# Patient Record
Sex: Female | Born: 1970 | Race: Black or African American | Hispanic: No | Marital: Single | Smoking: Former smoker
Health system: Southern US, Community
[De-identification: ages and names within clinical notes are randomized; demographics above are authoritative.]

## PROBLEM LIST (undated history)

## (undated) DIAGNOSIS — K219 Gastro-esophageal reflux disease without esophagitis: Secondary | ICD-10-CM

## (undated) DIAGNOSIS — N183 Chronic kidney disease, stage 3 unspecified: Secondary | ICD-10-CM

## (undated) DIAGNOSIS — G4730 Sleep apnea, unspecified: Secondary | ICD-10-CM

## (undated) DIAGNOSIS — E785 Hyperlipidemia, unspecified: Secondary | ICD-10-CM

## (undated) DIAGNOSIS — D649 Anemia, unspecified: Secondary | ICD-10-CM

## (undated) DIAGNOSIS — I10 Essential (primary) hypertension: Secondary | ICD-10-CM

## (undated) DIAGNOSIS — I509 Heart failure, unspecified: Secondary | ICD-10-CM

## (undated) DIAGNOSIS — R569 Unspecified convulsions: Secondary | ICD-10-CM

## (undated) DIAGNOSIS — J449 Chronic obstructive pulmonary disease, unspecified: Secondary | ICD-10-CM

## (undated) HISTORY — DX: Unspecified convulsions: R56.9

## (undated) HISTORY — DX: Heart failure, unspecified: I50.9

---

## 2016-12-19 DIAGNOSIS — N289 Disorder of kidney and ureter, unspecified: Secondary | ICD-10-CM

## 2016-12-19 DIAGNOSIS — I509 Heart failure, unspecified: Secondary | ICD-10-CM

## 2016-12-19 DIAGNOSIS — J209 Acute bronchitis, unspecified: Secondary | ICD-10-CM

## 2016-12-20 DIAGNOSIS — I509 Heart failure, unspecified: Secondary | ICD-10-CM

## 2017-07-27 DIAGNOSIS — I160 Hypertensive urgency: Secondary | ICD-10-CM

## 2017-07-27 DIAGNOSIS — N183 Chronic kidney disease, stage 3 (moderate): Secondary | ICD-10-CM

## 2017-07-27 DIAGNOSIS — R0989 Other specified symptoms and signs involving the circulatory and respiratory systems: Secondary | ICD-10-CM

## 2017-07-27 DIAGNOSIS — I10 Essential (primary) hypertension: Secondary | ICD-10-CM

## 2017-07-27 DIAGNOSIS — R748 Abnormal levels of other serum enzymes: Secondary | ICD-10-CM

## 2017-07-27 DIAGNOSIS — R9431 Abnormal electrocardiogram [ECG] [EKG]: Secondary | ICD-10-CM

## 2017-07-27 DIAGNOSIS — R079 Chest pain, unspecified: Secondary | ICD-10-CM

## 2017-07-27 DIAGNOSIS — M25511 Pain in right shoulder: Secondary | ICD-10-CM

## 2017-07-28 DIAGNOSIS — G40919 Epilepsy, unspecified, intractable, without status epilepticus: Secondary | ICD-10-CM

## 2017-07-28 DIAGNOSIS — Z9119 Patient's noncompliance with other medical treatment and regimen: Secondary | ICD-10-CM

## 2017-07-28 DIAGNOSIS — R569 Unspecified convulsions: Secondary | ICD-10-CM

## 2017-07-28 DIAGNOSIS — R0789 Other chest pain: Secondary | ICD-10-CM

## 2018-02-28 DIAGNOSIS — I160 Hypertensive urgency: Secondary | ICD-10-CM

## 2018-02-28 DIAGNOSIS — G40909 Epilepsy, unspecified, not intractable, without status epilepticus: Secondary | ICD-10-CM

## 2018-02-28 DIAGNOSIS — I130 Hypertensive heart and chronic kidney disease with heart failure and stage 1 through stage 4 chronic kidney disease, or unspecified chronic kidney disease: Secondary | ICD-10-CM

## 2018-02-28 DIAGNOSIS — N183 Chronic kidney disease, stage 3 (moderate): Secondary | ICD-10-CM

## 2018-02-28 DIAGNOSIS — I5043 Acute on chronic combined systolic (congestive) and diastolic (congestive) heart failure: Secondary | ICD-10-CM

## 2018-03-01 DIAGNOSIS — I509 Heart failure, unspecified: Secondary | ICD-10-CM

## 2018-03-02 DIAGNOSIS — R931 Abnormal findings on diagnostic imaging of heart and coronary circulation: Secondary | ICD-10-CM

## 2018-03-02 DIAGNOSIS — N183 Chronic kidney disease, stage 3 (moderate): Secondary | ICD-10-CM

## 2018-03-02 DIAGNOSIS — I5033 Acute on chronic diastolic (congestive) heart failure: Secondary | ICD-10-CM

## 2018-03-02 DIAGNOSIS — I160 Hypertensive urgency: Secondary | ICD-10-CM

## 2018-03-03 DIAGNOSIS — I509 Heart failure, unspecified: Secondary | ICD-10-CM

## 2018-03-04 DIAGNOSIS — R079 Chest pain, unspecified: Secondary | ICD-10-CM

## 2018-03-05 ENCOUNTER — Inpatient Hospital Stay (HOSPITAL_COMMUNITY)
Admission: AD | Admit: 2018-03-05 | Discharge: 2018-03-06 | DRG: 281 | Disposition: A | Discharge status: Home / Self Care | Payer: Self-pay | Source: Other Acute Inpatient Hospital | Attending: Cardiology | Admitting: Cardiology

## 2018-03-05 ENCOUNTER — Encounter (HOSPITAL_COMMUNITY): Payer: Self-pay | Admitting: Physician Assistant

## 2018-03-05 ENCOUNTER — Other Ambulatory Visit: Payer: Self-pay

## 2018-03-05 DIAGNOSIS — I129 Hypertensive chronic kidney disease with stage 1 through stage 4 chronic kidney disease, or unspecified chronic kidney disease: Secondary | ICD-10-CM | POA: Diagnosis present

## 2018-03-05 DIAGNOSIS — R0789 Other chest pain: Secondary | ICD-10-CM

## 2018-03-05 DIAGNOSIS — K219 Gastro-esophageal reflux disease without esophagitis: Secondary | ICD-10-CM | POA: Diagnosis present

## 2018-03-05 DIAGNOSIS — N183 Chronic kidney disease, stage 3 (moderate): Secondary | ICD-10-CM | POA: Diagnosis present

## 2018-03-05 DIAGNOSIS — I10 Essential (primary) hypertension: Secondary | ICD-10-CM

## 2018-03-05 DIAGNOSIS — Z8249 Family history of ischemic heart disease and other diseases of the circulatory system: Secondary | ICD-10-CM

## 2018-03-05 DIAGNOSIS — Z888 Allergy status to other drugs, medicaments and biological substances status: Secondary | ICD-10-CM

## 2018-03-05 DIAGNOSIS — N1831 Chronic kidney disease, stage 3a: Secondary | ICD-10-CM

## 2018-03-05 DIAGNOSIS — Z6841 Body Mass Index (BMI) 40.0 and over, adult: Secondary | ICD-10-CM

## 2018-03-05 DIAGNOSIS — I214 Non-ST elevation (NSTEMI) myocardial infarction: Principal | ICD-10-CM | POA: Diagnosis present

## 2018-03-05 DIAGNOSIS — Z87891 Personal history of nicotine dependence: Secondary | ICD-10-CM

## 2018-03-05 HISTORY — DX: Essential (primary) hypertension: I10

## 2018-03-05 HISTORY — DX: Gastro-esophageal reflux disease without esophagitis: K21.9

## 2018-03-05 HISTORY — DX: Chronic kidney disease, stage 3 unspecified: N18.30

## 2018-03-05 HISTORY — DX: Chronic kidney disease, stage 3 (moderate): N18.3

## 2018-03-05 LAB — TROPONIN I
Troponin I: 1.74 ng/mL (ref <0.03)
Troponin I: 1.59 ng/mL (ref <0.03)

## 2018-03-05 LAB — HEPARIN LEVEL (UNFRACTIONATED): Heparin Unfractionated: 0.17 IU/mL — ABNORMAL LOW (ref 0.30–0.70)

## 2018-03-05 MED ORDER — HEPARIN (PORCINE) IN NACL 100-0.45 UNIT/ML-% IJ SOLN
1700.0000 [IU]/h | INTRAMUSCULAR | Status: DC
  Administered 2018-03-05: 1250 [IU]/h via INTRAVENOUS
  Filled 2018-03-05: qty 250

## 2018-03-05 MED ORDER — NITROGLYCERIN 0.4 MG SL SUBL: 0.4000 mg | SUBLINGUAL_TABLET | SUBLINGUAL | Status: DC | PRN

## 2018-03-05 MED ORDER — TRAZODONE HCL 100 MG PO TABS
100.0000 mg | ORAL_TABLET | Freq: Every evening | ORAL | Status: DC | PRN
  Administered 2018-03-05: 100 mg via ORAL
  Filled 2018-03-05: qty 1

## 2018-03-05 MED ORDER — ONDANSETRON HCL 4 MG/2ML IJ SOLN: 4.0000 mg | Freq: Four times a day (QID) | INTRAMUSCULAR | Status: DC | PRN

## 2018-03-05 MED ORDER — OXCARBAZEPINE 300 MG PO TABS
600.0000 mg | ORAL_TABLET | Freq: Two times a day (BID) | ORAL | Status: DC
  Administered 2018-03-05 – 2018-03-06 (×2): 600 mg via ORAL
  Filled 2018-03-05 (×3): qty 2

## 2018-03-05 MED ORDER — ASPIRIN EC 81 MG PO TBEC
81.0000 mg | DELAYED_RELEASE_TABLET | Freq: Every day | ORAL | Status: DC
  Administered 2018-03-06: 81 mg via ORAL
  Filled 2018-03-05: qty 1

## 2018-03-05 MED ORDER — ACETAMINOPHEN 325 MG PO TABS: 650.0000 mg | ORAL_TABLET | ORAL | Status: DC | PRN

## 2018-03-05 MED ORDER — HYDRALAZINE HCL 50 MG PO TABS
50.0000 mg | ORAL_TABLET | Freq: Three times a day (TID) | ORAL | Status: DC
  Administered 2018-03-05 – 2018-03-06 (×4): 50 mg via ORAL
  Filled 2018-03-05 (×4): qty 1

## 2018-03-05 MED ORDER — CARVEDILOL 25 MG PO TABS
25.0000 mg | ORAL_TABLET | Freq: Two times a day (BID) | ORAL | Status: DC
  Administered 2018-03-05 – 2018-03-06 (×2): 25 mg via ORAL
  Filled 2018-03-05 (×2): qty 1

## 2018-03-05 MED ORDER — NITROGLYCERIN 2 % TD OINT
1.0000 [in_us] | TOPICAL_OINTMENT | Freq: Four times a day (QID) | TRANSDERMAL | Status: DC
  Administered 2018-03-05 – 2018-03-06 (×4): 1 [in_us] via TOPICAL
  Filled 2018-03-05: qty 30

## 2018-03-05 NOTE — Progress Notes (Signed)
Pt. Requesting home med for sleep and her seizure medication. On call MD for Cardiology paged to make aware. Verbal orders received. RN will implement as ordered. Lauris Keepers, Cheryll Dessert

## 2018-03-05 NOTE — Progress Notes (Signed)
ANTICOAGULATION CONSULT NOTE - Initial Consult  Pharmacy Consult for heparin  Indication: chest pain/ACS  Allergies  Allergen Reactions  . Ciprofloxacin Itching    Patient Measurements: Height: 5\' 6"  (167.6 cm) Weight: 283 lb 12.8 oz (128.7 kg)(scale C) IBW/kg (Calculated) : 59.3 Heparin Dosing Weight: 90  Vital Signs: Temp: 97.7 F (36.5 C) (10/11 1100) Temp Source: Oral (10/11 1100) BP: 158/105 (10/11 1100) Pulse Rate: 67 (10/11 1100)  Labs: No results for input(s): HGB, HCT, PLT, APTT, LABPROT, INR, HEPARINUNFRC, HEPRLOWMOCWT, CREATININE, CKTOTAL, CKMB, TROPONINI in the last 72 hours.  CrCl cannot be calculated (No successful lab value found.).   Medical History: Past Medical History:  Diagnosis Date  . Chronic kidney disease (CKD), stage III (moderate) (HCC)   . GERD (gastroesophageal reflux disease)   . Hypertension     Medications:  Medications Prior to Admission  Medication Sig Dispense Refill Last Dose  . aspirin EC 81 MG tablet Take 81 mg by mouth daily.   03/04/2018 at Unknown time  . cloNIDine (CATAPRES) 0.1 MG tablet Take 0.1 mg by mouth 2 (two) times daily.   unknown  . furosemide (LASIX) 40 MG tablet Take 40 mg by mouth.   03/04/2018 at Unknown time  . hydrALAZINE (APRESOLINE) 50 MG tablet Take 50 mg by mouth 2 (two) times daily.   03/04/2018 at Unknown time  . ibuprofen (ADVIL,MOTRIN) 400 MG tablet Take 400 mg by mouth every 8 (eight) hours as needed for fever or mild pain.   Past Week at Unknown time  . labetalol (NORMODYNE) 200 MG tablet Take 400 mg by mouth 2 (two) times daily.   03/04/2018 at Unknown time  . NIFEdipine (ADALAT CC) 90 MG 24 hr tablet Take 90 mg by mouth daily.   03/04/2018 at Unknown time  . oxcarbazepine (TRILEPTAL) 600 MG tablet Take 600 mg by mouth 2 (two) times daily.   03/04/2018 at Unknown time  . pantoprazole (PROTONIX) 40 MG tablet Take 40 mg by mouth daily.   03/04/2018  . traZODone (DESYREL) 100 MG tablet Take 100 mg by  mouth at bedtime as needed for sleep.   Past Week at Unknown time   Scheduled:  . carvedilol  25 mg Oral BID WC  . hydrALAZINE  50 mg Oral Q8H    Assessment: 47 yo female from [place] with CP. She is on heparin at 1000 units/hr and pharmacy consulted to dose -Hg= 14, plt= 215 (at [place])  Goal of Therapy:  Heparin level 0.3-0.7 units/ml Monitor platelets by anticoagulation protocol: Yes   Plan: -Increase heparin to  1250 units/hr -Heparin level in 6 hours and daily wth CBC daily  Harland German, PharmD Clinical Pharmacist Please check Amion for pharmacy contact number

## 2018-03-05 NOTE — Plan of Care (Signed)
  Problem: Activity: Goal: Risk for activity intolerance will decrease Outcome: Progressing   Problem: Safety: Goal: Ability to remain free from injury will improve Outcome: Progressing   

## 2018-03-05 NOTE — Progress Notes (Signed)
ANTICOAGULATION CONSULT NOTE   Pharmacy Consult for heparin  Indication: chest pain/ACS  Allergies  Allergen Reactions  . Ciprofloxacin Itching    Patient Measurements: Height: 5\' 6"  (167.6 cm) Weight: 283 lb 12.8 oz (128.7 kg)(scale C) IBW/kg (Calculated) : 59.3 Heparin Dosing Weight: 90  Vital Signs: Temp: 97.4 F (36.3 C) (10/11 2015) Temp Source: Oral (10/11 2015) BP: 141/80 (10/11 2015) Pulse Rate: 71 (10/11 2015)  Labs: Recent Labs    03/05/18 1512 03/05/18 1959  HEPARINUNFRC  --  0.17*  TROPONINI 1.74*  --     CrCl cannot be calculated (No successful lab value found.).   Medical History: Past Medical History:  Diagnosis Date  . Chronic kidney disease (CKD), stage III (moderate) (HCC)   . GERD (gastroesophageal reflux disease)   . Hypertension     Medications:  Medications Prior to Admission  Medication Sig Dispense Refill Last Dose  . aspirin EC 81 MG tablet Take 81 mg by mouth daily.   03/04/2018 at Unknown time  . cloNIDine (CATAPRES) 0.1 MG tablet Take 0.1 mg by mouth 2 (two) times daily.   unknown  . furosemide (LASIX) 40 MG tablet Take 40 mg by mouth.   03/04/2018 at Unknown time  . hydrALAZINE (APRESOLINE) 50 MG tablet Take 50 mg by mouth 2 (two) times daily.   03/04/2018 at Unknown time  . ibuprofen (ADVIL,MOTRIN) 400 MG tablet Take 400 mg by mouth every 8 (eight) hours as needed for fever or mild pain.   Past Week at Unknown time  . labetalol (NORMODYNE) 200 MG tablet Take 400 mg by mouth 2 (two) times daily.   03/04/2018 at Unknown time  . NIFEdipine (ADALAT CC) 90 MG 24 hr tablet Take 90 mg by mouth daily.   03/04/2018 at Unknown time  . oxcarbazepine (TRILEPTAL) 600 MG tablet Take 600 mg by mouth 2 (two) times daily.   03/04/2018 at Unknown time  . pantoprazole (PROTONIX) 40 MG tablet Take 40 mg by mouth daily.   03/04/2018  . traZODone (DESYREL) 100 MG tablet Take 100 mg by mouth at bedtime as needed for sleep.   Past Week at Unknown time    Scheduled:  . [START ON 03/06/2018] aspirin EC  81 mg Oral Daily  . carvedilol  25 mg Oral BID WC  . hydrALAZINE  50 mg Oral Q8H  . nitroGLYCERIN  1 inch Topical Q6H  . OXcarbazepine  600 mg Oral BID    Assessment: 47 yo female from [place] with CP.  -Hg= 14, plt= 215 (at [place]) -HL=0.17 this evening on 1250 units/hr  Goal of Therapy:  Heparin level 0.3-0.7 units/ml Monitor platelets by anticoagulation protocol: Yes   Plan: -Increase heparin to 1550 units/hr -Heparin level in 6 hours and daily wth CBC daily  Sheppard Coil PharmD., BCPS Clinical Pharmacist 03/05/2018 9:28 PM  '

## 2018-03-05 NOTE — H&P (Addendum)
Cardiology Admission History and Physical:   Patient ID: Sheila Hays MRN: [id number]; DOB: [birth year]   Admission date: 03/05/2018  Primary Care Provider: Yisroel Ramming, MD Primary Cardiologist: New to [facility] , will be follow at [place]   Chief Complaint:  CP  Patient Profile:   Sheila Hays is a 47 y.o. female with CKD stage III (followed at [facility] kidney) and hypertension transfer from [facility] with non-STEMI.  History of Present Illness:   Sheila Hays was admitted to [facility] 10/6-10/10 for chest pain and shortness of breath evaluation.  Records currently unavailable for review.  Patient not able to provide detailed history.  States that she had a high blood pressure.  She discharged with plan for outpatient stress test.  However, her return same day in the evening with worsening chest pain episode.  She describes her pain as a sharp left-sided radiating to her left shoulder.  At [facility], her troponin was elevated at 0.7.  She was started on IV heparin and nitro patch.  Transferred to [facility] for further evaluation and cardiac catheterization.  During my evaluation, patient complains of 5 out of 10 sharp chest pain however resting comfortably.  Pain reproducible with palpation.  She reports improvement of the pain with nitro at outside hospital.  She walks about half a mile every morning without any chest pain or shortness of breath.  Her symptoms started about a week ago leading to evaluation at [facility], for which records currently unavailable for review (requested).   She has a chronic kidney disease for which she follows at [facility] kidney.  Serum creatinine 2.  Unknown baseline.  She reports chronic dyspnea on exertion, intermittent orthopnea and lower extremity edema.  Denies palpitation, syncope, melena or blood in his stool or urine.  Prior history of tobacco smoking, quit 2018.  EKG showed normal sinus rhythm at rate  of 72 bpm with incomplete left bundle branch block and QTC of 512 MS.   Past Medical History:  Diagnosis Date  . Chronic kidney disease (CKD), stage III (moderate) (HCC)   . GERD (gastroesophageal reflux disease)   . Hypertension     Medications Prior to Admission: Prior to Admission medications   Not on File   Pharmacy to review   Allergies:   Allergies not on file  Social History:   Social History   Socioeconomic History  . Marital status: Single    Spouse name: Not on file  . Number of children: Not on file  . Years of education: Not on file  . Highest education level: Not on file  Occupational History  . Not on file  Social Needs  . Financial resource strain: Not on file  . Food insecurity:    Worry: Not on file    Inability: Not on file  . Transportation needs:    Medical: Not on file    Non-medical: Not on file  Tobacco Use  . Smoking status: Former Smoker  Substance and Sexual Activity  . Alcohol use: Not on file  . Drug use: Not on file  . Sexual activity: Not on file  Lifestyle  . Physical activity:    Days per week: Not on file    Minutes per session: Not on file  . Stress: Not on file  Relationships  . Social connections:    Talks on phone: Not on file    Gets together: Not on file    Attends religious service: Not on file  Active member of club or organization: Not on file    Attends meetings of clubs or organizations: Not on file    Relationship status: Not on file  . Intimate partner violence:    Fear of current or ex partner: Not on file    Emotionally abused: Not on file    Physically abused: Not on file    Forced sexual activity: Not on file  Other Topics Concern  . Not on file  Social History Narrative  . Not on file    Family History:   The patient's family history includes Hypertension in her father and mother.    ROS:  Please see the history of present illness.  All other ROS reviewed and negative.     Physical Exam/Data:     Vitals:   03/05/18 1100  BP: (!) 158/105  Pulse: 67  Resp: 18  Temp: 97.7 F (36.5 C)  TempSrc: Oral  SpO2: 100%  Weight: 128.7 kg  Height: 5\' 6"  (1.676 m)   No intake or output data in the 24 hours ending 03/05/18 1333 Filed Weights   03/05/18 1100  Weight: 128.7 kg   Body mass index is 45.81 kg/m.  General: Morbidly obese female in no acute distress HEENT: normal Lymph: no adenopathy Neck: no JVD Endocrine:  No thryomegaly Vascular: No carotid bruits; FA pulses 2+ bilaterally without bruits  Cardiac:  normal S1, S2; RRR; no murmur  Lungs:  clear to auscultation bilaterally, no wheezing, rhonchi or rales  Abd: soft, nontender, no hepatomegaly  Ext: Trace edema Musculoskeletal:  No deformities, BUE and BLE strength normal and equal Skin: warm and dry  Neuro:  CNs 2-12 intact, no focal abnormalities noted Psych:  Normal affect    EKG:  The ECG that was done 03/05/18 at [facility] was personally reviewed and demonstrates sinus rhythm at rate of 72 bpm with left bundle branch block  Relevant CV Studies: As above  Laboratory Data:  Radiology/Studies:  No results found.  Assessment and Plan:   1. Non-STEMI -She was admitted to [facility] 10/6-10/10 for chest pain and dyspnea evaluation.  She was hypertensive at that time. Discharged with plan for outpatient stress test (records requested) however return with worsening symptoms.  Troponin noted to be 0.7. EKG with LBBB, unknown chronicity.  She was started on heparin and Nitropaste and transferred to [facility]. -EKG shows sinus rhythm with left bundle branch block, unknown chronicity.  Currently 5 out of 10 sharp chest pain which is reproducible with palpation.  Scr of 2. - ? Type 2 NSTEMI in setting of hypertensive urgency.  -  Cycle troponin. Controlled BP. Probably hydration over weekend prior to cath (if needed) on Monday.  - Continue nitro patch, ASA 81mg  qd, labetalol and hydralazine.  Add statin. Hold home lasix.   2. CKD stage III - SCr of 2. Unknown baseline. Avoid nephrotoxic agent.   3.  Uncontrolled hypertension - BP of 158/105. As above.   4. Obesity - Will sleep study as outpatient   Severity of Illness: The appropriate patient status for this patient is INPATIENT. Inpatient status is judged to be reasonable and necessary in order to provide the required intensity of service to ensure the patient's safety. The patient's presenting symptoms, physical exam findings, and initial radiographic and laboratory data in the context of their chronic comorbidities is felt to place them at high risk for further clinical deterioration. Furthermore, it is not anticipated that the patient will be medically  stable for discharge from the hospital within 2 midnights of admission. The following factors support the patient status of inpatient.   " The patient's presenting symptoms include  Chest pain . " The worrisome physical exam findings include - none. " The initial radiographic and laboratory data are worrisome because of elevated troponin  " The chronic co-morbidities include obesity, CKD and HTN   * I certify that at the point of admission it is my clinical judgment that the patient will require inpatient hospital care spanning beyond 2 midnights from the point of admission due to high intensity of service, high risk for further deterioration and high frequency of surveillance required.*    For questions or updates, please contact [facility] Please consult [email or web address] for contact info under        Lorelei Pont, PA  03/05/2018 1:33 PM

## 2018-03-06 ENCOUNTER — Other Ambulatory Visit: Payer: Self-pay

## 2018-03-06 ENCOUNTER — Inpatient Hospital Stay (HOSPITAL_COMMUNITY): Payer: Medicaid Other

## 2018-03-06 ENCOUNTER — Encounter (HOSPITAL_COMMUNITY): Payer: Self-pay

## 2018-03-06 DIAGNOSIS — R079 Chest pain, unspecified: Secondary | ICD-10-CM

## 2018-03-06 LAB — NM MYOCAR MULTI W/SPECT W/WALL MOTION / EF
CHL CUP RESTING HR STRESS: 71 {beats}/min
CSEPEDS: 15 s
CSEPPHR: 89 {beats}/min
Estimated workload: 1 METS
Exercise duration (min): 5 min
LV dias vol: 174 mL (ref 46–106)
LV sys vol: 80 mL
NUC STRESS TID: 1.22

## 2018-03-06 LAB — HEPARIN LEVEL (UNFRACTIONATED)
HEPARIN UNFRACTIONATED: 0.26 [IU]/mL — AB (ref 0.30–0.70)
HEPARIN UNFRACTIONATED: 0.51 [IU]/mL (ref 0.30–0.70)

## 2018-03-06 LAB — LIPID PANEL
Cholesterol: 152 mg/dL (ref 0–200)
HDL: 42 mg/dL (ref >40)
LDL CALC: 89 mg/dL (ref 0–99)
Total CHOL/HDL Ratio: 3.6 RATIO
Triglycerides: 103 mg/dL (ref <150)
VLDL: 21 mg/dL (ref 0–40)

## 2018-03-06 LAB — CBC
HCT: 40.8 % (ref 36.0–46.0)
HEMOGLOBIN: 13.1 g/dL (ref 12.0–15.0)
MCH: 30.3 pg (ref 26.0–34.0)
MCHC: 32.1 g/dL (ref 30.0–36.0)
MCV: 94.2 fL (ref 80.0–100.0)
NRBC: 0 % (ref 0.0–0.2)
Platelets: 231 10*3/uL (ref 150–400)
RBC: 4.33 MIL/uL (ref 3.87–5.11)
RDW: 18.5 % — ABNORMAL HIGH (ref 11.5–15.5)
WBC: 5.2 10*3/uL (ref 4.0–10.5)

## 2018-03-06 LAB — BASIC METABOLIC PANEL
Anion gap: 9 (ref 5–15)
BUN: 19 mg/dL (ref 6–20)
CALCIUM: 9 mg/dL (ref 8.9–10.3)
CO2: 29 mmol/L (ref 22–32)
Chloride: 99 mmol/L (ref 98–111)
Creatinine, Ser: 1.61 mg/dL — ABNORMAL HIGH (ref 0.44–1.00)
GFR calc Af Amer: 42 mL/min — ABNORMAL LOW (ref >60)
GFR, EST NON AFRICAN AMERICAN: 37 mL/min — AB (ref >60)
GLUCOSE: 81 mg/dL (ref 70–99)
Potassium: 3.8 mmol/L (ref 3.5–5.1)
Sodium: 137 mmol/L (ref 135–145)

## 2018-03-06 LAB — TROPONIN I: Troponin I: 1.28 ng/mL (ref <0.03)

## 2018-03-06 MED ORDER — NITROGLYCERIN 0.4 MG SL SUBL: 0.4000 mg | SUBLINGUAL_TABLET | SUBLINGUAL | 2 refills | Status: DC | PRN

## 2018-03-06 MED ORDER — TECHNETIUM TC 99M TETROFOSMIN IV KIT
10.0000 | PACK | Freq: Once | INTRAVENOUS | Status: AC | PRN
  Administered 2018-03-06: 10 via INTRAVENOUS

## 2018-03-06 MED ORDER — REGADENOSON 0.4 MG/5ML IV SOLN
0.4000 mg | Freq: Once | INTRAVENOUS | Status: AC
  Administered 2018-03-06: 0.4 mg via INTRAVENOUS
  Filled 2018-03-06: qty 5

## 2018-03-06 MED ORDER — CARVEDILOL 25 MG PO TABS: 25.0000 mg | ORAL_TABLET | Freq: Two times a day (BID) | ORAL | 2 refills | Status: DC

## 2018-03-06 MED ORDER — TECHNETIUM TC 99M TETROFOSMIN IV KIT
30.0000 | PACK | Freq: Once | INTRAVENOUS | Status: AC | PRN
  Administered 2018-03-06: 30 via INTRAVENOUS

## 2018-03-06 MED ORDER — HYDRALAZINE HCL 50 MG PO TABS: 50.0000 mg | ORAL_TABLET | Freq: Two times a day (BID) | ORAL | 1 refills | Status: DC

## 2018-03-06 MED ORDER — REGADENOSON 0.4 MG/5ML IV SOLN
INTRAVENOUS | Status: AC
  Filled 2018-03-06: qty 5

## 2018-03-06 MED ORDER — NIFEDIPINE ER OSMOTIC RELEASE 90 MG PO TB24
90.0000 mg | ORAL_TABLET | Freq: Every day | ORAL | Status: DC
  Administered 2018-03-06: 90 mg via ORAL
  Filled 2018-03-06: qty 1

## 2018-03-06 MED ORDER — ATORVASTATIN CALCIUM 40 MG PO TABS: 40.0000 mg | ORAL_TABLET | Freq: Every day | ORAL | Status: DC

## 2018-03-06 MED ORDER — ATORVASTATIN CALCIUM 40 MG PO TABS: 40.0000 mg | ORAL_TABLET | Freq: Every day | ORAL | 2 refills | Status: DC

## 2018-03-06 NOTE — Progress Notes (Signed)
ANTICOAGULATION CONSULT NOTE   Pharmacy Consult for Heparin  Indication: chest pain/ACS  Allergies  Allergen Reactions  . Ciprofloxacin Itching    Patient Measurements: Height: 5\' 6"  (167.6 cm) Weight: 283 lb 12.8 oz (128.7 kg)(scale C) IBW/kg (Calculated) : 59.3 Heparin Dosing Weight: 90  Vital Signs: Temp: 97.4 F (36.3 C) (10/11 2015) Temp Source: Oral (10/11 2015) BP: 141/80 (10/11 2015) Pulse Rate: 71 (10/11 2015)  Labs: Recent Labs    03/05/18 1512 03/05/18 1959 03/06/18 0315  HGB  --   --  13.1  HCT  --   --  40.8  PLT  --   --  231  HEPARINUNFRC  --  0.17* 0.26*  CREATININE  --   --  1.61*  TROPONINI 1.74* 1.59*  --     Estimated Creatinine Clearance: 58.1 mL/min (A) (by C-G formula based on SCr of 1.61 mg/dL (H)).   Medical History: Past Medical History:  Diagnosis Date  . Chronic kidney disease (CKD), stage III (moderate) (HCC)   . GERD (gastroesophageal reflux disease)   . Hypertension     Medications:  Medications Prior to Admission  Medication Sig Dispense Refill Last Dose  . aspirin EC 81 MG tablet Take 81 mg by mouth daily.   03/04/2018 at Unknown time  . cloNIDine (CATAPRES) 0.1 MG tablet Take 0.1 mg by mouth 2 (two) times daily.   unknown  . furosemide (LASIX) 40 MG tablet Take 40 mg by mouth.   03/04/2018 at Unknown time  . hydrALAZINE (APRESOLINE) 50 MG tablet Take 50 mg by mouth 2 (two) times daily.   03/04/2018 at Unknown time  . ibuprofen (ADVIL,MOTRIN) 400 MG tablet Take 400 mg by mouth every 8 (eight) hours as needed for fever or mild pain.   Past Week at Unknown time  . labetalol (NORMODYNE) 200 MG tablet Take 400 mg by mouth 2 (two) times daily.   03/04/2018 at Unknown time  . NIFEdipine (ADALAT CC) 90 MG 24 hr tablet Take 90 mg by mouth daily.   03/04/2018 at Unknown time  . oxcarbazepine (TRILEPTAL) 600 MG tablet Take 600 mg by mouth 2 (two) times daily.   03/04/2018 at Unknown time  . pantoprazole (PROTONIX) 40 MG tablet Take 40  mg by mouth daily.   03/04/2018  . traZODone (DESYREL) 100 MG tablet Take 100 mg by mouth at bedtime as needed for sleep.   Past Week at Unknown time   Scheduled:  . aspirin EC  81 mg Oral Daily  . carvedilol  25 mg Oral BID WC  . hydrALAZINE  50 mg Oral Q8H  . nitroGLYCERIN  1 inch Topical Q6H  . OXcarbazepine  600 mg Oral BID    Assessment: 47 yo female from [place] with CP.  -Hg= 14, plt= 215 (at [place]) -HL=0.17 this evening on 1250 units/hr  10/12 AM update: heparin level low but trending up, CBC stable  Goal of Therapy:  Heparin level 0.3-0.7 units/ml Monitor platelets by anticoagulation protocol: Yes   Plan: -Increase heparin to 1700 units/hr -Heparin level in 6 hours and daily wth CBC daily  Abran Duke, PharmD, BCPS Clinical Pharmacist Phone: [phone]

## 2018-03-06 NOTE — Progress Notes (Signed)
Patient given discharge instructions, all questions answered. She is waiting for her ride.

## 2018-03-06 NOTE — Progress Notes (Signed)
PT. Refused to get out of bed for standing weight this am.

## 2018-03-06 NOTE — Progress Notes (Signed)
Progress Note  Patient Name: Sheila Hays Date of Encounter: 03/06/2018  Primary Cardiologist: Sheila [place], MD (new)  Subjective   Feeling well.  No chest pain.  She has exertional dyspnea.  Inpatient Medications    Scheduled Meds: . aspirin EC  81 mg Oral Daily  . atorvastatin  40 mg Oral q1800  . carvedilol  25 mg Oral BID WC  . hydrALAZINE  50 mg Oral Q8H  . NIFEdipine  90 mg Oral Daily  . nitroGLYCERIN  1 inch Topical Q6H  . OXcarbazepine  600 mg Oral BID  . regadenoson       Continuous Infusions: . heparin 1,700 Units/hr (03/06/18 4098)   PRN Meds: acetaminophen, nitroGLYCERIN, ondansetron (ZOFRAN) IV, traZODone   Vital Signs    Vitals:   03/06/18 0918 03/06/18 0920 03/06/18 1048 03/06/18 1049  BP: (!) 133/95 132/89  (!) 103/49  Pulse:    78  Resp:    18  Temp:    98.1 F (36.7 C)  TempSrc:    Oral  SpO2:    100%  Weight:   128.9 kg   Height:        Intake/Output Summary (Last 24 hours) at 03/06/2018 1118 Last data filed at 03/06/2018 0300 Gross per 24 hour  Intake 645.89 ml  Output 900 ml  Net -254.11 ml   Filed Weights   03/05/18 1100 03/06/18 1048  Weight: 128.7 kg 128.9 kg    Telemetry    Sinus rhythm.  No events- Personally Reviewed  ECG    n/a - Personally Reviewed  Physical Exam   VS:  BP (!) 103/49   Pulse 78   Temp 98.1 F (36.7 C) (Oral)   Resp 18   Ht 5\' 6"  (1.676 m)   Wt 128.9 kg   SpO2 100%   BMI 45.85 kg/m  , BMI Body mass index is 45.85 kg/m. GENERAL:  Well appearing HEENT: Pupils equal round and reactive, fundi not visualized, oral mucosa unremarkable NECK:  No jugular venous distention, waveform within normal limits, carotid upstroke brisk and symmetric, no bruits, no thyromegaly LYMPHATICS:  No cervical adenopathy LUNGS:  Clear to auscultation bilaterally HEART:  RRR.  PMI not displaced or sustained,S1 and S2 within normal limits, no S3, no S4, no clicks, no rubs, no murmurs ABD:  Flat, positive bowel  sounds normal in frequency in pitch, no bruits, no rebound, no guarding, no midline pulsatile mass, no hepatomegaly, no splenomegaly EXT:  2 plus pulses throughout, trace edema, no cyanosis no clubbing SKIN:  No rashes no nodules NEURO:  Cranial nerves II through XII grossly intact, motor grossly intact throughout [facility]:  Cognitively intact, oriented to person place and time   Labs    Chemistry Recent Labs  Lab 03/06/18 0315  NA 137  K 3.8  CL 99  CO2 29  GLUCOSE 81  BUN 19  CREATININE 1.61*  CALCIUM 9.0  GFRNONAA 37*  GFRAA 42*  ANIONGAP 9     Hematology Recent Labs  Lab 03/06/18 0315  WBC 5.2  RBC 4.33  HGB 13.1  HCT 40.8  MCV 94.2  MCH 30.3  MCHC 32.1  RDW 18.5*  PLT 231    Cardiac Enzymes Recent Labs  Lab 03/05/18 1512 03/05/18 1959 03/06/18 0315  TROPONINI 1.74* 1.59* 1.28*   No results for input(s): TROPIPOC in the last 168 hours.   BNPNo results for input(s): BNP, PROBNP in the last 168 hours.   DDimer No results for input(s):  DDIMER in the last 168 hours.   Radiology    No results found.  Cardiac Studies   Lexiscan Myoview pending  Will obtain OSH echo report  Patient Profile     47 y.o. female with hypertension, obesity, and CKD 3 here with chest pain and elevated troponin.  Assessment & Plan    # NSTEMI: Troponin elevated to 1.74.  [name]'s symptoms are atypical and she is at risk of contrast nephropathy given her CKD.  She has gone for [employer] today.  Results are pending.  LDL was 89 this admission.  We will add atorvastatin 40mg .  She will need lipids/CMP checked in 6-8 weeks.  She is on heparin and currently not having any chest pain.  Carvedilol was started this admission.  Continue this and aspirin.  # Hypertension: Blood pressures been poorly controlled this admission.  Her home regimen includes hydralazine, clonidine, and nifedipine.  Carvedilol was added this admission.  We will add back her nifedipine as blood  pressure remains poorly controlled.  # CKD III: Her creatinine was 1.37 on 12/2017.  It is up to 1.6 this admission.  Avoid nephrotoxic agents.   For questions or updates, please contact [facility] Please consult [email or web address] for contact info under        Signed, Chilton Si, MD  03/06/2018, 11:18 AM

## 2018-03-06 NOTE — Progress Notes (Signed)
Stress test complete. Patient denies any complaints at this time. PA at bedside reviewing EKG.

## 2018-03-06 NOTE — Discharge Summary (Addendum)
Discharge Summary    Patient ID: Sheila Hays,  MRN: [id number], DOB/AGE: [birth year] 49 y.o.  Admit date: 03/05/2018 Discharge date: 03/06/2018  Primary Care Provider: Yisroel Ramming Primary Cardiologist: Dr. [place]az[place]   Discharge Diagnoses    Active Problems:   NSTEMI (non-ST elevated myocardial infarction) [facility])   Other chest pain   Essential hypertension   Stage 3 chronic kidney disease (HCC)   Allergies Allergies  Allergen Reactions  . Ciprofloxacin Itching    Diagnostic Studies/Procedures    Lexiscan 03/06/18    There was no ST segment deviation noted during stress.  No T wave inversion was noted during stress.  Defect 1: There is a small defect of mild severity present in the basal anteroseptal and mid anteroseptal location.  Findings consistent with ischemia.  This is a low risk study.  The left ventricular ejection fraction is mildly decreased (45-54%).  Anteroseptal perfusion defect does not correlate with the inferior wall motion abnormality. The anteroseptal defect may be either ischemia or breast attenuation artifact. _____________   History of Present Illness     Sheila Hays was admitted to [facility] 10/6-10/10 for chest pain and shortness of breath evaluation.  Records currently unavailable for review.  Patient was not able to provide detailed history.  Stated that she had a high blood pressure.  She discharged with plan for outpatient stress test.  However, her return same day in the evening with worsening chest pain episode.  She described her pain as a sharp left-sided radiating to her left shoulder.  At [facility], her troponin was elevated at 0.7.  She was started on IV heparin and nitro patch.  Transferred to [facility] for further evaluation and cardiac catheterization.  During evaluation, patient complained of 5 out of 10 sharp chest pain however resting comfortably.  Pain reproducible with palpation.  She reported improvement of  the pain with nitro at outside hospital.  She walks about half a mile every morning without any chest pain or shortness of breath.  Her symptoms started about a week ago leading to evaluation at [facility], for which records were currently unavailable for review (requested).   She has a chronic kidney disease for which she follows at [facility] kidney.  Serum creatinine 2.  Unknown baseline.  She reported chronic dyspnea on exertion, intermittent orthopnea and lower extremity edema.  Denied palpitation, syncope, melena or blood in his stool or urine.  Prior history of tobacco smoking, quit 2018.  EKG showed normal sinus rhythm at rate of 72 bpm with incomplete left bundle branch block and QTC of 512 MS. Given her CKD decision was made for her to undergo lexiscan.   Hospital Course     # NSTEMI: Troponin elevated to 1.74.  Ms. Brandes's symptoms are atypical and she was at risk of contrast nephropathy given her CKD.  She underwent Lexiscan Myoview noted above with small area of defect in the anteroseptal location consistent with ischemia vs artifact.  Given her CKD decision was made to manage medically. LDL was 89 this admission. Added atorvastatin 40mg .  She will need lipids/CMP checked in 6-8 weeks. Carvedilol was started this admission as well.  Continue this and aspirin.  # Hypertension: Blood pressures been poorly controlled this admission.  Her home regimen includes hydralazine, clonidine, and nifedipine.  Carvedilol was added this admission.  We will add back her nifedipine at the time of discharge as blood pressure remains poorly controlled.  # CKD III: Her creatinine was  1.37 on 12/2017.  It was up to 1.6 during this admission.  Avoid nephrotoxic agents.   Sheila Hays was seen by Dr. Duke Salvia and determined stable for discharge home. Follow up in the office has been arranged. Medications are listed below.   _____________  Discharge Vitals Blood pressure (!) 147/76,  pulse 78, temperature 98.1 F (36.7 C), temperature source Oral, resp. rate 18, height 5\' 6"  (1.676 m), weight 128.9 kg, SpO2 100 %.  Filed Weights   03/05/18 1100 03/06/18 1048  Weight: 128.7 kg 128.9 kg    Labs & Radiologic Studies    CBC Recent Labs    03/06/18 0315  WBC 5.2  HGB 13.1  HCT 40.8  MCV 94.2  PLT 231   Basic Metabolic Panel Recent Labs    16/10/96 0315  NA 137  K 3.8  CL 99  CO2 29  GLUCOSE 81  BUN 19  CREATININE 1.61*  CALCIUM 9.0   Liver Function Tests No results for input(s): AST, ALT, ALKPHOS, BILITOT, PROT, ALBUMIN in the last 72 hours. No results for input(s): LIPASE, AMYLASE in the last 72 hours. Cardiac Enzymes Recent Labs    03/05/18 1512 03/05/18 1959 03/06/18 0315  TROPONINI 1.74* 1.59* 1.28*   BNP Invalid input(s): POCBNP D-Dimer No results for input(s): DDIMER in the last 72 hours. Hemoglobin A1C No results for input(s): HGBA1C in the last 72 hours. Fasting Lipid Panel Recent Labs    03/06/18 0315  CHOL 152  HDL 42  LDLCALC 89  TRIG 103  CHOLHDL 3.6   Thyroid Function Tests No results for input(s): TSH, T4TOTAL, T3FREE, THYROIDAB in the last 72 hours.  Invalid input(s): FREET3 _____________  Nm Myocar Multi W/spect W/wall Motion / Ef  Result Date: 03/06/2018  There was no ST segment deviation noted during stress.  No T wave inversion was noted during stress.  Defect 1: There is a small defect of mild severity present in the basal anteroseptal and mid anteroseptal location.  Findings consistent with ischemia.  This is a low risk study.  The left ventricular ejection fraction is mildly decreased (45-54%).  Anteroseptal perfusion defect does not correlate with the inferior wall motion abnormality. The anteroseptal defect may be either ischemia or breast attenuation artifact.    Disposition   Pt is being discharged home today in good condition.  Follow-up Plans & Appointments    Follow-up Information     Swaziland, [name], MD Follow up.   Specialty:  Cardiology Why:  Office will call you with a follow up appt  Contact information: [address] [place] [address] [phone]          Discharge Instructions    Diet - low sodium heart healthy   Complete by:  As directed    Discharge instructions   Complete by:  As directed    Please keep track of your blood pressure and bring to your follow up appt.   Increase activity slowly   Complete by:  As directed        Discharge Medications     Medication List    STOP taking these medications   labetalol 200 MG tablet Commonly known as:  NORMODYNE     TAKE these medications   aspirin EC 81 MG tablet Take 81 mg by mouth daily.   atorvastatin 40 MG tablet Commonly known as:  LIPITOR Take 1 tablet (40 mg total) by mouth daily at 6 PM.   carvedilol 25 MG tablet  Commonly known as:  COREG Take 1 tablet (25 mg total) by mouth 2 (two) times daily with a meal.   cloNIDine 0.1 MG tablet Commonly known as:  CATAPRES Take 0.1 mg by mouth 2 (two) times daily.   furosemide 40 MG tablet Commonly known as:  LASIX Take 40 mg by mouth.   hydrALAZINE 50 MG tablet Commonly known as:  APRESOLINE Take 1 tablet (50 mg total) by mouth 2 (two) times daily.   ibuprofen 400 MG tablet Commonly known as:  ADVIL,MOTRIN Take 400 mg by mouth every 8 (eight) hours as needed for fever or mild pain.   NIFEdipine 90 MG 24 hr tablet Commonly known as:  ADALAT CC Take 90 mg by mouth daily.   nitroGLYCERIN 0.4 MG SL tablet Commonly known as:  NITROSTAT Place 1 tablet (0.4 mg total) under the tongue every 5 (five) minutes as needed.   oxcarbazepine 600 MG tablet Commonly known as:  TRILEPTAL Take 600 mg by mouth 2 (two) times daily.   pantoprazole 40 MG tablet Commonly known as:  PROTONIX Take 40 mg by mouth daily.   traZODone 100 MG tablet Commonly known as:  DESYREL Take 100 mg by mouth at bedtime as needed for sleep.          Acute coronary syndrome (MI, NSTEMI, STEMI, etc) this admission?: No.     Outstanding Labs/Studies   FLP/LFTs in 6 weeks. BMET at follow up appt.   Duration of Discharge Encounter   Greater than 30 minutes including physician time.  Signed, Laverda Page NP-C 03/06/2018, 2:27 PM

## 2018-03-06 NOTE — Progress Notes (Signed)
Provided w MATCH letter

## 2018-03-06 NOTE — Progress Notes (Addendum)
Patient presented for Lexiscan. Tolerated procedure well. Pending final stress imaging result.   Records now in chart. MD to review. Patient states that she sees cardiologist at [facility] and followed there. Prior hx of combined CHF however LVEF improved by echo last week at [facility]. ?PDA.

## 2018-03-06 NOTE — Progress Notes (Signed)
ANTICOAGULATION CONSULT NOTE   Pharmacy Consult for Heparin  Indication: chest pain/ACS  Allergies  Allergen Reactions  . Ciprofloxacin Itching    Patient Measurements: Height: 5\' 6"  (167.6 cm) Weight: 284 lb 1.6 oz (128.9 kg) IBW/kg (Calculated) : 59.3 Heparin Dosing Weight: 90  Vital Signs: Temp: 98.1 F (36.7 C) (10/12 1049) Temp Source: Oral (10/12 1049) BP: 103/49 (10/12 1049) Pulse Rate: 78 (10/12 1049)  Labs: Recent Labs    03/05/18 1512 03/05/18 1959 03/06/18 0315 03/06/18 1251  HGB  --   --  13.1  --   HCT  --   --  40.8  --   PLT  --   --  231  --   HEPARINUNFRC  --  0.17* 0.26* 0.51  CREATININE  --   --  1.61*  --   TROPONINI 1.74* 1.59* 1.28*  --     Estimated Creatinine Clearance: 58.1 mL/min (A) (by C-G formula based on SCr of 1.61 mg/dL (H)).   Medical History: Past Medical History:  Diagnosis Date  . Chronic kidney disease (CKD), stage III (moderate) (HCC)   . GERD (gastroesophageal reflux disease)   . Hypertension     Medications:  Medications Prior to Admission  Medication Sig Dispense Refill Last Dose  . aspirin EC 81 MG tablet Take 81 mg by mouth daily.   03/04/2018 at Unknown time  . cloNIDine (CATAPRES) 0.1 MG tablet Take 0.1 mg by mouth 2 (two) times daily.   unknown  . furosemide (LASIX) 40 MG tablet Take 40 mg by mouth.   03/04/2018 at Unknown time  . hydrALAZINE (APRESOLINE) 50 MG tablet Take 50 mg by mouth 2 (two) times daily.   03/04/2018 at Unknown time  . ibuprofen (ADVIL,MOTRIN) 400 MG tablet Take 400 mg by mouth every 8 (eight) hours as needed for fever or mild pain.   Past Week at Unknown time  . labetalol (NORMODYNE) 200 MG tablet Take 400 mg by mouth 2 (two) times daily.   03/04/2018 at Unknown time  . NIFEdipine (ADALAT CC) 90 MG 24 hr tablet Take 90 mg by mouth daily.   03/04/2018 at Unknown time  . oxcarbazepine (TRILEPTAL) 600 MG tablet Take 600 mg by mouth 2 (two) times daily.   03/04/2018 at Unknown time  .  pantoprazole (PROTONIX) 40 MG tablet Take 40 mg by mouth daily.   03/04/2018  . traZODone (DESYREL) 100 MG tablet Take 100 mg by mouth at bedtime as needed for sleep.   Past Week at Unknown time   Scheduled:  . aspirin EC  81 mg Oral Daily  . atorvastatin  40 mg Oral q1800  . carvedilol  25 mg Oral BID WC  . hydrALAZINE  50 mg Oral Q8H  . NIFEdipine  90 mg Oral Daily  . nitroGLYCERIN  1 inch Topical Q6H  . OXcarbazepine  600 mg Oral BID  . regadenoson        Assessment: 47 yo female from [place] with CP.  -HL=0.17(10/10)-->therapeutic today at 0.51 CBC stable, no signs of bleeding noted  10/12 AM update: heparin level low but trending up, CBC stable  Goal of Therapy:  Heparin level 0.3-0.7 units/ml Monitor platelets by anticoagulation protocol: Yes   Plan: -Continue heparin to 1700 units/hr -Heparin level in 6 hours and daily wth CBC daily  Gwynneth Albright, [place] D PGY1 Pharmacy Resident  Phone [phone] 03/06/2018   1:52 PM

## 2018-03-25 ENCOUNTER — Ambulatory Visit: Payer: Self-pay | Admitting: Cardiology

## 2018-03-31 ENCOUNTER — Ambulatory Visit: Payer: Self-pay | Admitting: Cardiology

## 2018-04-27 ENCOUNTER — Encounter: Payer: Self-pay | Admitting: Cardiology

## 2018-04-27 DIAGNOSIS — Z6841 Body Mass Index (BMI) 40.0 and over, adult: Secondary | ICD-10-CM

## 2018-04-27 DIAGNOSIS — E66813 Obesity, class 3: Secondary | ICD-10-CM | POA: Insufficient documentation

## 2018-04-28 ENCOUNTER — Ambulatory Visit: Payer: Self-pay | Admitting: Cardiology

## 2018-05-03 ENCOUNTER — Encounter: Payer: Self-pay | Admitting: *Deleted

## 2018-12-01 DIAGNOSIS — R9431 Abnormal electrocardiogram [ECG] [EKG]: Secondary | ICD-10-CM

## 2018-12-01 DIAGNOSIS — J811 Chronic pulmonary edema: Secondary | ICD-10-CM

## 2018-12-01 DIAGNOSIS — I5033 Acute on chronic diastolic (congestive) heart failure: Secondary | ICD-10-CM

## 2018-12-01 DIAGNOSIS — R079 Chest pain, unspecified: Secondary | ICD-10-CM

## 2018-12-01 DIAGNOSIS — I160 Hypertensive urgency: Secondary | ICD-10-CM

## 2018-12-01 DIAGNOSIS — I517 Cardiomegaly: Secondary | ICD-10-CM

## 2018-12-01 DIAGNOSIS — I509 Heart failure, unspecified: Secondary | ICD-10-CM

## 2018-12-01 DIAGNOSIS — R931 Abnormal findings on diagnostic imaging of heart and coronary circulation: Secondary | ICD-10-CM

## 2018-12-01 DIAGNOSIS — N183 Chronic kidney disease, stage 3 (moderate): Secondary | ICD-10-CM

## 2018-12-01 DIAGNOSIS — Z9119 Patient's noncompliance with other medical treatment and regimen: Secondary | ICD-10-CM

## 2018-12-01 DIAGNOSIS — I10 Essential (primary) hypertension: Secondary | ICD-10-CM

## 2019-01-11 NOTE — Progress Notes (Deleted)
Cardiology Office Note:    Date:  01/11/2019   ID:  Sheila ArcherRhonda Renee Hays, DOB 1971/03/04, MRN [id number]  PCP:  Yisroel RammingVollmer, Kelly, MD  Cardiologist:  Norman Herrlich , MD    Referring MD: [facility]    ASSESSMENT:    No diagnosis found. PLAN:    In order of problems listed above:  1. ***   Next appointment: ***   Medication Adjustments/Labs and Tests Ordered: Current medicines are reviewed at length with the patient today.  Concerns regarding medicines are outlined above.  No orders of the defined types were placed in this encounter.  No orders of the defined types were placed in this encounter.   No chief complaint on file.   History of Present Illness:    Sheila ArcherRhonda Renee Hays is a 48 y.o. female with a hx of hypertension with stage III CKD left bundle branch block and ACS with troponin elevation managed medically October 2019 last seen at hospital discharge 03/06/2018.  Because of her CAD she underwent an inpatient Lexiscan Myoview study which showed a mild ischemic defect in the basal anterior septal and then anterior septal location ejection fraction was mildly reduced and decision was made to treat the patient medically.. Compliance with diet, lifestyle and medications: *** Past Medical History:  Diagnosis Date  . Chronic kidney disease (CKD), stage III (moderate) (HCC)   . GERD (gastroesophageal reflux disease)   . Hypertension   . Seizures (HCC)    Follow[facility] at [facility]    No past surgical history on file.  Current Medications: No outpatient medications have been marked as taking for the 01/12/19 encounter (Appointment) with Baldo Daub,  J, MD.     Allergies:   Ciprofloxacin   Social History   Socioeconomic History  . Marital status: Single    Spouse name: Not on file  . Number of children: Not on file  . Years of education: Not on file  . Highest education level: Not on file  Occupational History  . Not on file  Social Needs  . Financial resource  strain: Not on file  . Food insecurity    Worry: Not on file    Inability: Not on file  . Transportation needs    Medical: Not on file    Non-medical: Not on file  Tobacco Use  . Smoking status: Former Games developermoker  . Smokeless tobacco: Former NeurosurgeonUser    Quit date: 03/06/2017  Substance and Sexual Activity  . Alcohol use: Not on file  . Drug use: Not on file  . Sexual activity: Not on file  Lifestyle  . Physical activity    Days per week: Not on file    Minutes per session: Not on file  . Stress: Not on file  Relationships  . Social Musicianconnections    Talks on phone: Not on file    Gets together: Not on file    Attends religious service: Not on file    Active member of club or organization: Not on file    Attends meetings of clubs or organizations: Not on file    Relationship status: Not on file  Other Topics Concern  . Not on file  Social History Narrative  . Not on file     Family History: The patient's ***family history includes Hypertension in her father and mother; Seizures in her sister. ROS:   Please see the history of present illness.    All other systems reviewed and are negative.  EKGs/Labs/Other Studies Reviewed:  The following studies were reviewed today:  EKG:  EKG ordered today and personally reviewed.  The ekg ordered today demonstrates ***  Recent Labs: 03/06/2018: BUN 19; Creatinine, Ser 1.61; Hemoglobin 13.1; Platelets 231; Potassium 3.8; Sodium 137  Recent Lipid Panel    Component Value Date/Time   CHOL 152 03/06/2018 0315   TRIG 103 03/06/2018 0315   HDL 42 03/06/2018 0315   CHOLHDL 3.6 03/06/2018 0315   VLDL 21 03/06/2018 0315   LDLCALC 89 03/06/2018 0315    Physical Exam:    VS:  There were no vitals taken for this visit.    Wt Readings from Last 3 Encounters:  03/06/18 284 lb 1.6 oz (128.9 kg)     GEN: *** Well nourished, well developed in no acute distress HEENT: Normal NECK: No JVD; No carotid bruits LYMPHATICS: No lymphadenopathy  CARDIAC: ***RRR, no murmurs, rubs, gallops RESPIRATORY:  Clear to auscultation without rales, wheezing or rhonchi  ABDOMEN: Soft, non-tender, non-distended MUSCULOSKELETAL:  No edema; No deformity  SKIN: Warm and dry NEUROLOGIC:  Alert and oriented x 3 PSYCHIATRIC:  Normal affect    Signed, Shirlee More, MD  01/11/2019 12:50 PM    [facility]

## 2019-01-12 ENCOUNTER — Ambulatory Visit: Payer: Self-pay | Admitting: Cardiology

## 2019-10-21 ENCOUNTER — Telehealth: Payer: Self-pay

## 2019-10-21 NOTE — Telephone Encounter (Signed)
NOTES ON FILE FROM [facility] [phone], SENT REFERRAL TO SCHEDULING 

## 2019-12-07 ENCOUNTER — Ambulatory Visit: Payer: Self-pay | Admitting: Cardiology

## 2020-01-25 ENCOUNTER — Ambulatory Visit: Payer: Self-pay | Admitting: Cardiology

## 2020-01-31 ENCOUNTER — Encounter: Payer: Self-pay | Admitting: Family Medicine

## 2020-09-22 ENCOUNTER — Emergency Department
Admission: EM | Admit: 2020-09-22 | Discharge: 2020-09-22 | Disposition: A | Discharge status: Home / Self Care | Payer: Medicare (Managed Care) | Attending: Emergency Medicine | Admitting: Emergency Medicine

## 2020-09-22 ENCOUNTER — Other Ambulatory Visit: Payer: Self-pay

## 2020-09-22 ENCOUNTER — Emergency Department: Payer: Medicare (Managed Care)

## 2020-09-22 DIAGNOSIS — Y92002 Bathroom of unspecified non-institutional (private) residence single-family (private) house as the place of occurrence of the external cause: Secondary | ICD-10-CM | POA: Insufficient documentation

## 2020-09-22 DIAGNOSIS — I129 Hypertensive chronic kidney disease with stage 1 through stage 4 chronic kidney disease, or unspecified chronic kidney disease: Secondary | ICD-10-CM | POA: Diagnosis not present

## 2020-09-22 DIAGNOSIS — S8002XA Contusion of left knee, initial encounter: Secondary | ICD-10-CM | POA: Diagnosis not present

## 2020-09-22 DIAGNOSIS — Z79899 Other long term (current) drug therapy: Secondary | ICD-10-CM | POA: Insufficient documentation

## 2020-09-22 DIAGNOSIS — R569 Unspecified convulsions: Secondary | ICD-10-CM | POA: Insufficient documentation

## 2020-09-22 DIAGNOSIS — Z7982 Long term (current) use of aspirin: Secondary | ICD-10-CM | POA: Insufficient documentation

## 2020-09-22 DIAGNOSIS — S8992XA Unspecified injury of left lower leg, initial encounter: Secondary | ICD-10-CM | POA: Diagnosis present

## 2020-09-22 DIAGNOSIS — N183 Chronic kidney disease, stage 3 unspecified: Secondary | ICD-10-CM | POA: Insufficient documentation

## 2020-09-22 DIAGNOSIS — W19XXXA Unspecified fall, initial encounter: Secondary | ICD-10-CM | POA: Insufficient documentation

## 2020-09-22 DIAGNOSIS — Z87891 Personal history of nicotine dependence: Secondary | ICD-10-CM | POA: Diagnosis not present

## 2020-09-22 DIAGNOSIS — M25562 Pain in left knee: Secondary | ICD-10-CM

## 2020-09-22 MED ORDER — HYDROCODONE-ACETAMINOPHEN 5-325 MG PO TABS: 2.0000 | ORAL_TABLET | Freq: Three times a day (TID) | ORAL | 0 refills | Status: DC | PRN

## 2020-09-22 MED ORDER — HYDROCODONE-ACETAMINOPHEN 5-325 MG PO TABS
1.0000 | ORAL_TABLET | Freq: Once | ORAL | Status: AC
  Administered 2020-09-22: 1 via ORAL
  Filled 2020-09-22: qty 1

## 2020-09-22 NOTE — ED Provider Notes (Signed)
[facility] Emergency Department Provider Note ____________________________________________  Time seen: 1620  I have reviewed the triage vital signs and the nursing notes.  HISTORY  Chief Complaint  Knee Injury   HPI Sheila Hays is a 50 y.o. female presents to the ER today with complaint of left knee pain and swelling.  She reports this started last night after a fall secondary to a seizure.  She reports she hit her left knee on something but is not sure what.  She describes the pain as sharp and stabbing.  The pain radiates into her calf and lower leg.  She reports associated swelling, warmth and abrasion.  She denies numbness or tingling of the left lower extremity.  She has taken Tylenol OTC with minimal relief of symptoms.  She denies hitting her head or losing consciousness.  She has a history of HTN secondary to CKD.  Her BP today is 217/103.  She reports she has taken all of her blood pressure medications today.  She reports her blood pressure can run this high when she is in pain.  Past Medical History:  Diagnosis Date  . Chronic kidney disease (CKD), stage III (moderate) (HCC)   . GERD (gastroesophageal reflux disease)   . Hypertension   . Seizures (HCC)    Followed at [facility]    Patient Active Problem List   Diagnosis Date Noted  . Morbid obesity with BMI of 40.0-44.9, adult (HCC) 04/27/2018  . NSTEMI (non-ST elevated myocardial infarction) (HCC) 03/05/2018  . Other chest pain   . Essential hypertension   . Stage 3 chronic kidney disease (HCC)     No past surgical history on file.  Prior to Admission medications   Medication Sig Start Date End Date Taking? Authorizing Provider  HYDROcodone-acetaminophen (NORCO/VICODIN) 5-325 MG tablet Take 2 tablets by mouth every 8 (eight) hours as needed for moderate pain. 09/22/20 09/22/21 Yes BaitySalvadore Oxford, NP  aspirin EC 81 MG tablet Take 81 mg by mouth daily.    [provider]  atorvastatin  (LIPITOR) 40 MG tablet Take 1 tablet (40 mg total) by mouth daily at 6 PM. 03/06/18   Arty Baumgartner, NP  carvedilol (COREG) 25 MG tablet Take 1 tablet (25 mg total) by mouth 2 (two) times daily with a meal. 03/06/18   Laverda Page B, NP  cloNIDine (CATAPRES) 0.1 MG tablet Take 0.1 mg by mouth 2 (two) times daily.    [provider]  furosemide (LASIX) 40 MG tablet Take 40 mg by mouth.    [provider]  hydrALAZINE (APRESOLINE) 50 MG tablet Take 1 tablet (50 mg total) by mouth 2 (two) times daily. 03/06/18   Arty Baumgartner, NP  ibuprofen (ADVIL,MOTRIN) 400 MG tablet Take 400 mg by mouth every 8 (eight) hours as needed for fever or mild pain.    [provider]  NIFEdipine (ADALAT CC) 90 MG 24 hr tablet Take 90 mg by mouth daily.    [provider]  nitroGLYCERIN (NITROSTAT) 0.4 MG SL tablet Place 1 tablet (0.4 mg total) under the tongue every 5 (five) minutes as needed. 03/06/18   Arty Baumgartner, NP  oxcarbazepine (TRILEPTAL) 600 MG tablet Take 600 mg by mouth 2 (two) times daily.    [provider]  pantoprazole (PROTONIX) 40 MG tablet Take 40 mg by mouth daily.    [provider]  traZODone (DESYREL) 100 MG tablet Take 100 mg by mouth at bedtime as needed for sleep.  [provider]    Allergies Ciprofloxacin  Family History  Problem Relation Age of Onset  . Hypertension Mother   . Hypertension Father   . Seizures Sister     Social History Social History   Tobacco Use  . Smoking status: Former Games developer  . Smokeless tobacco: Former Neurosurgeon    Quit date: 03/06/2017    Review of Systems  Constitutional: Negative for fever, chills or body aches. Eyes: Negative for visual changes. Cardiovascular: Negative for chest pain or chest tightness. Respiratory: Negative for cough or shortness of breath. Musculoskeletal: Positive for left knee pain, left leg swelling and difficulty with weightbearing.  Negative for  hip or ankle pain. Skin: Positive for redness, warmth and abrasion of the left leg. Neurological: Positive for focal weakness of the left lower extremity.  Negative for headaches, dizziness focal tingling or numbness. ____________________________________________  PHYSICAL EXAM:  VITAL SIGNS: ED Triage Vitals [09/22/20 1606]  Enc Vitals Group     BP (!) 227/92     Pulse Rate 84     Resp 20     Temp 99.2 F (37.3 C)     Temp Source Oral     SpO2 94 %     Weight 230 lb (104.3 kg)     Height 5\' 6"  (1.676 m)     Head Circumference      Peak Flow      Pain Score 10     Pain Loc      Pain Edu?      Excl. in GC?     Constitutional: Alert and oriented.  Obese, in no distress. Head: Normocephalic and atraumatic. Eyes: Sclera white.  Normal extraocular movements Cardiovascular: Normal rate, regular rhythm.  Pedal pulses 2+ bilaterally.  1+ pitting edema LLE.  Negative Homans' sign. Respiratory: Normal respiratory effort. No wheezes/rales/rhonchi. Musculoskeletal: Decreased flexion of the left knee secondary to pain.  Normal extension.  Generalized pain with palpation of the left knee.  1+ swelling of the left knee noted.  She is able to stand and pivot for transfer but is having difficulty bearing weight on her left lower extremity. Neurologic: Normal speech and language.  Sensation intact to LLE.  No gross focal neurologic deficits are appreciated. Skin: Redness and warmth noted from the left ankle extending above the left knee.  Abrasion to the left lateral knee.  ____________________________________________  RADIOLOGY  Imaging Orders     DG Knee Complete 4 Views Left     Venous Img Lower Unilateral Left IMPRESSION: 1. No acute displaced fracture. 2. Moderate 3 compartmental osteoarthritis. 3. Diffuse subcutaneous edema.  IMPRESSION: 1. No evidence of deep venous thrombosis within the left lower extremity.   ____________________________________________   INITIAL IMPRESSION  / ASSESSMENT AND PLAN / ED COURSE  Acute Left Knee Pain and Swelling s/p Fall secondary to Seizure, LLE Swelling, Redness and Warmth:  DDx include knee fracture, soft tissue injury of left knee, DVT LLE Xray left knee shows diffuse subcutaneous edema, good pedal pulse so no concern for compartment syndrome Venous US LLE negative for DVT Hydrocodone 5-325 mg PO x 1 in ER Pt placed in knee immobilizer to give extra support for weight bearing, wear as needed She declines crutches RX for Hydrocodone 5-325 mg PO Q8H prn #15, 0 refills She will follow up with her PCP if symptoms persist     I reviewed the patient's prescription history over the last 12 months in the multi-state controlled substances database(s) that includes [place],  [place], [place], [place], [place], [place], [place], [place], [place], [place], [place], [place], [place], and [place].  Results were notable for no recent narcotic use ____________________________________________  FINAL CLINICAL IMPRESSION(S) / ED DIAGNOSES  Final diagnoses:  Acute pain of left knee  Contusion of left knee, initial encounter      Lorre Munroe, NP 09/22/20 1804    Minna Antis, MD 09/22/20 2311

## 2020-09-22 NOTE — ED Triage Notes (Signed)
Pt to ED [facility] from home for left knee injury after hitting it while having seizure. Hx seizure and takes meds. Pt alert and oriented, clear speech  Hx HTN  Per Dr Lenard Lance, no seizure work up at this time d/t hx seizure

## 2020-09-22 NOTE — ED Triage Notes (Signed)
Arrived by EMS from home for seizure. Reports seizure in bathroom around 11pm and boyfriend helped her up afterwards. Left knee swollen. Able to put weight on foot. EMS vitals 197/110 b/p, HR 80s, 94-97% RA.

## 2020-09-22 NOTE — Discharge Instructions (Addendum)
You were seen today for left knee pain and swelling status post fall.  Your x-ray is negative for fracture of the knee.  Your ultrasound was negative for a blood clot in the left leg.  We have given you a knee immobilizer that you can wear to give you extra support while ambulating.  You may take this off as needed.  We recommend ice and elevation of the left lower extremity to help reduce swelling.  I have given you a limited supply of pain medication to take as needed for severe pain.  Please follow-up with your PCP if symptoms persist or worsen.

## 2020-11-09 ENCOUNTER — Other Ambulatory Visit: Payer: Self-pay | Admitting: Physician Assistant

## 2020-11-09 DIAGNOSIS — Z1231 Encounter for screening mammogram for malignant neoplasm of breast: Secondary | ICD-10-CM

## 2020-11-29 ENCOUNTER — Other Ambulatory Visit: Payer: Self-pay

## 2020-11-29 ENCOUNTER — Emergency Department: Payer: Medicare Other

## 2020-11-29 ENCOUNTER — Inpatient Hospital Stay
Admission: EM | Admit: 2020-11-29 | Discharge: 2020-12-03 | DRG: 291 | Disposition: A | Discharge status: Home / Self Care | Payer: Medicare Other | Attending: Hospitalist | Admitting: Hospitalist

## 2020-11-29 ENCOUNTER — Emergency Department (HOSPITAL_COMMUNITY): Payer: Medicare Other

## 2020-11-29 ENCOUNTER — Encounter: Payer: Self-pay | Admitting: Emergency Medicine

## 2020-11-29 DIAGNOSIS — G9341 Metabolic encephalopathy: Secondary | ICD-10-CM

## 2020-11-29 DIAGNOSIS — J9601 Acute respiratory failure with hypoxia: Secondary | ICD-10-CM

## 2020-11-29 DIAGNOSIS — I130 Hypertensive heart and chronic kidney disease with heart failure and stage 1 through stage 4 chronic kidney disease, or unspecified chronic kidney disease: Principal | ICD-10-CM | POA: Diagnosis present

## 2020-11-29 DIAGNOSIS — R0789 Other chest pain: Secondary | ICD-10-CM

## 2020-11-29 DIAGNOSIS — Z20822 Contact with and (suspected) exposure to covid-19: Secondary | ICD-10-CM | POA: Diagnosis present

## 2020-11-29 DIAGNOSIS — Z7951 Long term (current) use of inhaled steroids: Secondary | ICD-10-CM

## 2020-11-29 DIAGNOSIS — G40209 Localization-related (focal) (partial) symptomatic epilepsy and epileptic syndromes with complex partial seizures, not intractable, without status epilepticus: Secondary | ICD-10-CM

## 2020-11-29 DIAGNOSIS — G40219 Localization-related (focal) (partial) symptomatic epilepsy and epileptic syndromes with complex partial seizures, intractable, without status epilepticus: Secondary | ICD-10-CM | POA: Diagnosis present

## 2020-11-29 DIAGNOSIS — Z8249 Family history of ischemic heart disease and other diseases of the circulatory system: Secondary | ICD-10-CM

## 2020-11-29 DIAGNOSIS — Z87891 Personal history of nicotine dependence: Secondary | ICD-10-CM

## 2020-11-29 DIAGNOSIS — E66813 Obesity, class 3: Secondary | ICD-10-CM

## 2020-11-29 DIAGNOSIS — I10 Essential (primary) hypertension: Secondary | ICD-10-CM

## 2020-11-29 DIAGNOSIS — R0602 Shortness of breath: Secondary | ICD-10-CM | POA: Diagnosis not present

## 2020-11-29 DIAGNOSIS — N1831 Chronic kidney disease, stage 3a: Secondary | ICD-10-CM

## 2020-11-29 DIAGNOSIS — I5033 Acute on chronic diastolic (congestive) heart failure: Secondary | ICD-10-CM

## 2020-11-29 DIAGNOSIS — I252 Old myocardial infarction: Secondary | ICD-10-CM

## 2020-11-29 DIAGNOSIS — I5043 Acute on chronic combined systolic (congestive) and diastolic (congestive) heart failure: Secondary | ICD-10-CM

## 2020-11-29 DIAGNOSIS — Z7982 Long term (current) use of aspirin: Secondary | ICD-10-CM

## 2020-11-29 DIAGNOSIS — K219 Gastro-esophageal reflux disease without esophagitis: Secondary | ICD-10-CM | POA: Diagnosis present

## 2020-11-29 DIAGNOSIS — I509 Heart failure, unspecified: Secondary | ICD-10-CM

## 2020-11-29 DIAGNOSIS — I161 Hypertensive emergency: Secondary | ICD-10-CM

## 2020-11-29 DIAGNOSIS — Z79899 Other long term (current) drug therapy: Secondary | ICD-10-CM

## 2020-11-29 LAB — CBC
HCT: 37.4 % (ref 36.0–46.0)
Hemoglobin: 11.6 g/dL — ABNORMAL LOW (ref 12.0–15.0)
MCH: 26.1 pg (ref 26.0–34.0)
MCHC: 31 g/dL (ref 30.0–36.0)
MCV: 84 fL (ref 80.0–100.0)
Platelets: 215 10*3/uL (ref 150–400)
RBC: 4.45 MIL/uL (ref 3.87–5.11)
RDW: 18 % — ABNORMAL HIGH (ref 11.5–15.5)
WBC: 4.6 10*3/uL (ref 4.0–10.5)
nRBC: 0 % (ref 0.0–0.2)

## 2020-11-29 LAB — BASIC METABOLIC PANEL
Anion gap: 6 (ref 5–15)
BUN: 17 mg/dL (ref 6–20)
CO2: 31 mmol/L (ref 22–32)
Calcium: 9 mg/dL (ref 8.9–10.3)
Chloride: 103 mmol/L (ref 98–111)
Creatinine, Ser: 1.29 mg/dL — ABNORMAL HIGH (ref 0.44–1.00)
GFR, Estimated: 50 mL/min — ABNORMAL LOW (ref >60)
Glucose, Bld: 100 mg/dL — ABNORMAL HIGH (ref 70–99)
Potassium: 4.4 mmol/L (ref 3.5–5.1)
Sodium: 140 mmol/L (ref 135–145)

## 2020-11-29 LAB — TROPONIN I (HIGH SENSITIVITY)
Troponin I (High Sensitivity): 22 ng/L — ABNORMAL HIGH (ref <18)
Troponin I (High Sensitivity): 21 ng/L — ABNORMAL HIGH (ref <18)

## 2020-11-29 MED ORDER — ACETAMINOPHEN 500 MG PO TABS
1000.0000 mg | ORAL_TABLET | Freq: Once | ORAL | Status: AC
  Administered 2020-11-29: 1000 mg via ORAL
  Filled 2020-11-29: qty 2

## 2020-11-29 MED ORDER — FUROSEMIDE 10 MG/ML IJ SOLN
80.0000 mg | Freq: Once | INTRAMUSCULAR | Status: AC
  Administered 2020-11-29: 80 mg via INTRAVENOUS
  Filled 2020-11-29: qty 8

## 2020-11-29 NOTE — Discharge Instructions (Addendum)
I recommend you increase your Lasix to 80 mg daily for the next week.  Please schedule an appointment for close follow-up with your primary care physician as well as cardiology.

## 2020-11-29 NOTE — ED Triage Notes (Signed)
Pt comes into the ED via [facility] from home c/o central chest pain that radiates into the left side of her back.  Pt states she is having SHOB and lack of appetite.  PT denies any known cardiac history other than CHF.  Pt admits to swelling in bilateral lower legs.  Pt in NAD at this time with even and unlabored respirations.

## 2020-11-29 NOTE — ED Notes (Signed)
Pt reports 8/10 headache. OK to administer 1g tylenol per MD.

## 2020-11-29 NOTE — ED Notes (Signed)
First nurse-pt has chest pain since last night. No sob.  Hx chf. Vital signs wnl per ems.  Pt alert.

## 2020-11-29 NOTE — ED Provider Notes (Signed)
[facility] Emergency Department Provider Note   ____________________________________________   Event Date/Time   First MD Initiated Contact with Patient 11/29/20 2211     (approximate)  I have reviewed the triage vital signs and the nursing notes.   HISTORY  Chief Complaint Shortness of Breath   HPI Sheila Hays is a 50 y.o. female with past medical history of hypertension, CHF, CKD, and seizures who presents to the ED complaining of shortness of breath.  Patient reports that she has become increasingly short of breath over about the past week with increasing swelling in her legs that extends up into her lower abdomen.  She has had a dry cough but denies significant sputum production and has not had any fevers.  She has previously dealt with swelling in her legs but it has never gotten this severe and she states that usually responds to her daily 60 mg of Lasix.  She recently relocated to the area and has been following with her PCP, who prescribes Lasix, but she has not establish care with cardiology locally.  She does endorse some pain in her chest which she describes as a tightness.  She does not have significant pain in her chest currently.        Past Medical History:  Diagnosis Date   Chronic kidney disease (CKD), stage III (moderate) (HCC)    GERD (gastroesophageal reflux disease)    Hypertension    Seizures (HCC)    Followed at [facility]    Patient Active Problem List   Diagnosis Date Noted   Morbid obesity with BMI of 40.0-44.9, adult (HCC) 04/27/2018   NSTEMI (non-ST elevated myocardial infarction) (HCC) 03/05/2018   Other chest pain    Essential hypertension    Stage 3 chronic kidney disease (HCC)     History reviewed. No pertinent surgical history.  Prior to Admission medications   Medication Sig Start Date End Date Taking? Authorizing Provider  aspirin EC 81 MG tablet Take 81 mg by mouth daily.    [provider]   atorvastatin (LIPITOR) 40 MG tablet Take 1 tablet (40 mg total) by mouth daily at 6 PM. 03/06/18   Arty Baumgartner, NP  carvedilol (COREG) 25 MG tablet Take 1 tablet (25 mg total) by mouth 2 (two) times daily with a meal. 03/06/18   Laverda Page B, NP  cloNIDine (CATAPRES) 0.1 MG tablet Take 0.1 mg by mouth 2 (two) times daily.    [provider]  furosemide (LASIX) 40 MG tablet Take 40 mg by mouth.    [provider]  hydrALAZINE (APRESOLINE) 50 MG tablet Take 1 tablet (50 mg total) by mouth 2 (two) times daily. 03/06/18   Arty Baumgartner, NP  HYDROcodone-acetaminophen (NORCO/VICODIN) 5-325 MG tablet Take 2 tablets by mouth every 8 (eight) hours as needed for moderate pain. 09/22/20 09/22/21  Lorre Munroe, NP  ibuprofen (ADVIL,MOTRIN) 400 MG tablet Take 400 mg by mouth every 8 (eight) hours as needed for fever or mild pain.    [provider]  NIFEdipine (ADALAT CC) 90 MG 24 hr tablet Take 90 mg by mouth daily.    [provider]  nitroGLYCERIN (NITROSTAT) 0.4 MG SL tablet Place 1 tablet (0.4 mg total) under the tongue every 5 (five) minutes as needed. 03/06/18   Arty Baumgartner, NP  oxcarbazepine (TRILEPTAL) 600 MG tablet Take 600 mg by mouth 2 (two) times daily.    [provider]  pantoprazole (PROTONIX) 40 MG  tablet Take 40 mg by mouth daily.    [provider]  traZODone (DESYREL) 100 MG tablet Take 100 mg by mouth at bedtime as needed for sleep.    [provider]    Allergies Ciprofloxacin  Family History  Problem Relation Age of Onset   Hypertension Mother    Hypertension Father    Seizures Sister     Social History Social History   Tobacco Use   Smoking status: Former    Pack years: 0.00   Smokeless tobacco: Former    Quit date: 03/06/2017    Review of Systems  Constitutional: No fever/chills Eyes: No visual changes. ENT: No sore throat. Cardiovascular: Positive for chest  pain. Respiratory: Positive for cough and shortness of breath. Gastrointestinal: No abdominal pain.  No nausea, no vomiting.  No diarrhea.  No constipation. Genitourinary: Negative for dysuria. Musculoskeletal: Negative for back pain.  Positive for leg swelling. Skin: Negative for rash. Neurological: Negative for headaches, focal weakness or numbness.  ____________________________________________   PHYSICAL EXAM:  VITAL SIGNS: ED Triage Vitals  Enc Vitals Group     BP 11/29/20 1850 (!) 183/99     Pulse Rate 11/29/20 1850 61     Resp 11/29/20 1850 20     Temp 11/29/20 1850 98.6 F (37 C)     Temp Source 11/29/20 1850 Oral     SpO2 11/29/20 1850 96 %     Weight 11/29/20 1851 285 lb (129.3 kg)     Height 11/29/20 1851 5\' 6"  (1.676 m)     Head Circumference --      Peak Flow --      Pain Score 11/29/20 1856 7     Pain Loc --      Pain Edu? --      Excl. in GC? --     Constitutional: Alert and oriented. Eyes: Conjunctivae are normal. Head: Atraumatic. Nose: No congestion/rhinnorhea. Mouth/Throat: Mucous membranes are moist. Neck: Normal ROM Cardiovascular: Normal rate, regular rhythm. Grossly normal heart sounds.  2+ radial pulses bilaterally. Respiratory: Normal respiratory effort.  No retractions. Lungs CTAB. Gastrointestinal: Soft and nontender. No distention. Genitourinary: deferred Musculoskeletal: No lower extremity tenderness.  2+ pitting edema up into thighs bilaterally with additional pitting edema in her lower abdomen. Neurologic:  Normal speech and language. No gross focal neurologic deficits are appreciated. Skin:  Skin is warm, dry and intact. No rash noted. Psychiatric: Mood and affect are normal. Speech and behavior are normal.  ____________________________________________   LABS (all labs ordered are listed, but only abnormal results are displayed)  Labs Reviewed  BASIC METABOLIC PANEL - Abnormal; Notable for the following components:      Result  Value   Glucose, Bld 100 (*)    Creatinine, Ser 1.29 (*)    GFR, Estimated 50 (*)    All other components within normal limits  CBC - Abnormal; Notable for the following components:   Hemoglobin 11.6 (*)    RDW 18.0 (*)    All other components within normal limits  TROPONIN I (HIGH SENSITIVITY) - Abnormal; Notable for the following components:   Troponin I (High Sensitivity) 22 (*)    All other components within normal limits  TROPONIN I (HIGH SENSITIVITY) - Abnormal; Notable for the following components:   Troponin I (High Sensitivity) 21 (*)    All other components within normal limits  POC URINE PREG, ED   ____________________________________________  EKG  ED ECG REPORT I, 01/30/21, the attending physician, personally  viewed and interpreted this ECG.   Date: 11/29/2020  EKG Time: 18:57  Rate: 63  Rhythm: normal sinus rhythm  Axis: RAD  Intervals:none  ST&T Change: None   PROCEDURES  Procedure(s) performed (including Critical Care):  Procedures   ____________________________________________   INITIAL IMPRESSION / ASSESSMENT AND PLAN / ED COURSE      50 year old female with past medical history of hypertension, CHF, CKD, and seizures who presents to the ED with increasing difficulty breathing along with swelling extending up into her legs and lower abdomen over the past week.  She is not in any respiratory distress and is maintaining O2 sats on room air.  She does have significant edema affecting much of her lower body, chest x-ray reviewed by me and does show some mild pulmonary edema.  I suspect the tightness in her chest is due to fluid overload and we will diurese with IV Lasix.  EKG shows no evidence of arrhythmia or ischemia and 2 sets of troponin are negative, low suspicion for ACS or PE given her atypical symptoms.  Plan to reassess following IV Lasix and have patient ambulate on a pulse ox.  If she is able to maintain her O2 sats without significant  difficulty breathing or pain in her chest, she would be appropriate for discharge home with follow-up in the heart failure clinic.  Patient turned over to oncoming rider pending reassessment.      ____________________________________________   FINAL CLINICAL IMPRESSION(S) / ED DIAGNOSES  Final diagnoses:  Acute on chronic congestive heart failure, unspecified heart failure type [facility])  Atypical chest pain     ED Discharge Orders          Ordered    AMB referral to CHF clinic        11/29/20 2311             Note:  This document was prepared using Dragon voice recognition software and may include unintentional dictation errors.    Chesley Noon, MD 11/29/20 2320

## 2020-11-30 ENCOUNTER — Inpatient Hospital Stay
Admit: 2020-11-30 | Discharge: 2020-11-30 | Disposition: A | Discharge status: Home / Self Care | Payer: Medicare Other | Attending: Internal Medicine | Admitting: Internal Medicine

## 2020-11-30 ENCOUNTER — Encounter: Payer: Self-pay | Admitting: Internal Medicine

## 2020-11-30 ENCOUNTER — Inpatient Hospital Stay: Payer: Medicare Other

## 2020-11-30 DIAGNOSIS — Z7951 Long term (current) use of inhaled steroids: Secondary | ICD-10-CM | POA: Diagnosis not present

## 2020-11-30 DIAGNOSIS — I130 Hypertensive heart and chronic kidney disease with heart failure and stage 1 through stage 4 chronic kidney disease, or unspecified chronic kidney disease: Secondary | ICD-10-CM | POA: Diagnosis present

## 2020-11-30 DIAGNOSIS — G40219 Localization-related (focal) (partial) symptomatic epilepsy and epileptic syndromes with complex partial seizures, intractable, without status epilepticus: Secondary | ICD-10-CM | POA: Diagnosis present

## 2020-11-30 DIAGNOSIS — I5043 Acute on chronic combined systolic (congestive) and diastolic (congestive) heart failure: Secondary | ICD-10-CM | POA: Diagnosis present

## 2020-11-30 DIAGNOSIS — K219 Gastro-esophageal reflux disease without esophagitis: Secondary | ICD-10-CM | POA: Diagnosis present

## 2020-11-30 DIAGNOSIS — I252 Old myocardial infarction: Secondary | ICD-10-CM | POA: Diagnosis not present

## 2020-11-30 DIAGNOSIS — I161 Hypertensive emergency: Secondary | ICD-10-CM

## 2020-11-30 DIAGNOSIS — I5033 Acute on chronic diastolic (congestive) heart failure: Secondary | ICD-10-CM

## 2020-11-30 DIAGNOSIS — G9341 Metabolic encephalopathy: Secondary | ICD-10-CM

## 2020-11-30 DIAGNOSIS — N1831 Chronic kidney disease, stage 3a: Secondary | ICD-10-CM | POA: Diagnosis present

## 2020-11-30 DIAGNOSIS — Z79899 Other long term (current) drug therapy: Secondary | ICD-10-CM | POA: Diagnosis not present

## 2020-11-30 DIAGNOSIS — G40209 Localization-related (focal) (partial) symptomatic epilepsy and epileptic syndromes with complex partial seizures, not intractable, without status epilepticus: Secondary | ICD-10-CM

## 2020-11-30 DIAGNOSIS — Z20822 Contact with and (suspected) exposure to covid-19: Secondary | ICD-10-CM | POA: Diagnosis present

## 2020-11-30 DIAGNOSIS — Z87891 Personal history of nicotine dependence: Secondary | ICD-10-CM | POA: Diagnosis not present

## 2020-11-30 DIAGNOSIS — Z7982 Long term (current) use of aspirin: Secondary | ICD-10-CM | POA: Diagnosis not present

## 2020-11-30 DIAGNOSIS — Z8249 Family history of ischemic heart disease and other diseases of the circulatory system: Secondary | ICD-10-CM | POA: Diagnosis not present

## 2020-11-30 DIAGNOSIS — R0602 Shortness of breath: Secondary | ICD-10-CM | POA: Diagnosis present

## 2020-11-30 LAB — CBC
HCT: 40.2 % (ref 36.0–46.0)
Hemoglobin: 12.2 g/dL (ref 12.0–15.0)
MCH: 25.3 pg — ABNORMAL LOW (ref 26.0–34.0)
MCHC: 30.3 g/dL (ref 30.0–36.0)
MCV: 83.4 fL (ref 80.0–100.0)
Platelets: 228 10*3/uL (ref 150–400)
RBC: 4.82 MIL/uL (ref 3.87–5.11)
RDW: 18.1 % — ABNORMAL HIGH (ref 11.5–15.5)
WBC: 6.2 10*3/uL (ref 4.0–10.5)
nRBC: 0 % (ref 0.0–0.2)

## 2020-11-30 LAB — URINE DRUG SCREEN, QUALITATIVE (ARMC ONLY)
Amphetamines, Ur Screen: NOT DETECTED
Barbiturates, Ur Screen: NOT DETECTED
Benzodiazepine, Ur Scrn: NOT DETECTED
Cannabinoid 50 Ng, Ur ~~LOC~~: NOT DETECTED
Cocaine Metabolite,Ur ~~LOC~~: NOT DETECTED
MDMA (Ecstasy)Ur Screen: NOT DETECTED
Methadone Scn, Ur: NOT DETECTED
Opiate, Ur Screen: NOT DETECTED
Phencyclidine (PCP) Ur S: NOT DETECTED
Tricyclic, Ur Screen: NOT DETECTED

## 2020-11-30 LAB — URINALYSIS, COMPLETE (UACMP) WITH MICROSCOPIC
Bacteria, UA: NONE SEEN
Bilirubin Urine: NEGATIVE
Glucose, UA: NEGATIVE mg/dL
Hgb urine dipstick: NEGATIVE
Ketones, ur: NEGATIVE mg/dL
Leukocytes,Ua: NEGATIVE
Nitrite: NEGATIVE
Protein, ur: NEGATIVE mg/dL
Specific Gravity, Urine: 1.005 (ref 1.005–1.030)
WBC, UA: NONE SEEN WBC/hpf (ref 0–5)
pH: 7 (ref 5.0–8.0)

## 2020-11-30 LAB — HIV ANTIBODY (ROUTINE TESTING W REFLEX): HIV Screen 4th Generation wRfx: NONREACTIVE

## 2020-11-30 LAB — CREATININE, SERUM
Creatinine, Ser: 1.31 mg/dL — ABNORMAL HIGH (ref 0.44–1.00)
GFR, Estimated: 49 mL/min — ABNORMAL LOW (ref >60)

## 2020-11-30 LAB — POC URINE PREG, ED: Preg Test, Ur: NEGATIVE

## 2020-11-30 LAB — RESP PANEL BY RT-PCR (FLU A&B, COVID) ARPGX2
Influenza A by PCR: NEGATIVE
Influenza B by PCR: NEGATIVE
SARS Coronavirus 2 by RT PCR: NEGATIVE

## 2020-11-30 LAB — ECHOCARDIOGRAM COMPLETE
Height: 66 in
S' Lateral: 3.43 cm
Weight: 4560 oz

## 2020-11-30 LAB — BRAIN NATRIURETIC PEPTIDE: B Natriuretic Peptide: 1117.9 pg/mL — ABNORMAL HIGH (ref 0.0–100.0)

## 2020-11-30 LAB — AMMONIA: Ammonia: 15 umol/L (ref 9–35)

## 2020-11-30 LAB — CBG MONITORING, ED: Glucose-Capillary: 94 mg/dL (ref 70–99)

## 2020-11-30 MED ORDER — ACETAMINOPHEN 325 MG PO TABS
650.0000 mg | ORAL_TABLET | Freq: Four times a day (QID) | ORAL | Status: DC | PRN
  Administered 2020-11-30 – 2020-12-02 (×4): 650 mg via ORAL
  Filled 2020-11-30 (×4): qty 2

## 2020-11-30 MED ORDER — PANTOPRAZOLE SODIUM 40 MG PO TBEC
40.0000 mg | DELAYED_RELEASE_TABLET | Freq: Every day | ORAL | Status: DC
  Administered 2020-11-30 – 2020-12-03 (×4): 40 mg via ORAL
  Filled 2020-11-30 (×4): qty 1

## 2020-11-30 MED ORDER — HYDRALAZINE HCL 20 MG/ML IJ SOLN
10.0000 mg | Freq: Once | INTRAMUSCULAR | Status: AC
  Administered 2020-11-30: 10 mg via INTRAVENOUS
  Filled 2020-11-30: qty 1

## 2020-11-30 MED ORDER — LOSARTAN POTASSIUM 50 MG PO TABS
50.0000 mg | ORAL_TABLET | Freq: Every day | ORAL | Status: DC
  Administered 2020-11-30: 50 mg via ORAL
  Filled 2020-11-30 (×2): qty 1

## 2020-11-30 MED ORDER — NITROGLYCERIN 2 % TD OINT
1.0000 [in_us] | TOPICAL_OINTMENT | Freq: Once | TRANSDERMAL | Status: AC
  Administered 2020-11-30: 1 [in_us] via TOPICAL
  Filled 2020-11-30: qty 1

## 2020-11-30 MED ORDER — SENNOSIDES-DOCUSATE SODIUM 8.6-50 MG PO TABS: 1.0000 | ORAL_TABLET | Freq: Every evening | ORAL | Status: DC | PRN

## 2020-11-30 MED ORDER — HYDRALAZINE HCL 50 MG PO TABS
100.0000 mg | ORAL_TABLET | Freq: Three times a day (TID) | ORAL | Status: DC
  Administered 2020-11-30 – 2020-12-01 (×2): 100 mg via ORAL
  Filled 2020-11-30 (×2): qty 2

## 2020-11-30 MED ORDER — ATORVASTATIN CALCIUM 20 MG PO TABS
40.0000 mg | ORAL_TABLET | Freq: Every day | ORAL | Status: DC
  Administered 2020-11-30 – 2020-12-02 (×3): 40 mg via ORAL
  Filled 2020-11-30 (×3): qty 2

## 2020-11-30 MED ORDER — ONDANSETRON HCL 4 MG/2ML IJ SOLN: 4.0000 mg | Freq: Four times a day (QID) | INTRAMUSCULAR | Status: DC | PRN

## 2020-11-30 MED ORDER — ISOSORBIDE MONONITRATE ER 30 MG PO TB24
30.0000 mg | ORAL_TABLET | Freq: Every day | ORAL | Status: DC
  Administered 2020-11-30 – 2020-12-03 (×4): 30 mg via ORAL
  Filled 2020-11-30 (×4): qty 1

## 2020-11-30 MED ORDER — FUROSEMIDE 10 MG/ML IJ SOLN
80.0000 mg | Freq: Once | INTRAMUSCULAR | Status: AC
  Administered 2020-11-30: 80 mg via INTRAVENOUS
  Filled 2020-11-30: qty 8

## 2020-11-30 MED ORDER — FUROSEMIDE 10 MG/ML IJ SOLN
40.0000 mg | Freq: Two times a day (BID) | INTRAMUSCULAR | Status: DC
  Administered 2020-11-30: 40 mg via INTRAVENOUS
  Filled 2020-11-30: qty 4

## 2020-11-30 MED ORDER — MOMETASONE FURO-FORMOTEROL FUM 200-5 MCG/ACT IN AERO
2.0000 | INHALATION_SPRAY | Freq: Two times a day (BID) | RESPIRATORY_TRACT | Status: DC
  Administered 2020-11-30 – 2020-12-03 (×6): 2 via RESPIRATORY_TRACT
  Filled 2020-11-30: qty 8.8

## 2020-11-30 MED ORDER — ONDANSETRON HCL 4 MG PO TABS: 4.0000 mg | ORAL_TABLET | Freq: Four times a day (QID) | ORAL | Status: DC | PRN

## 2020-11-30 MED ORDER — CARVEDILOL 25 MG PO TABS
25.0000 mg | ORAL_TABLET | Freq: Two times a day (BID) | ORAL | Status: DC
  Administered 2020-11-30 – 2020-12-03 (×6): 25 mg via ORAL
  Filled 2020-11-30 (×7): qty 1

## 2020-11-30 MED ORDER — OXCARBAZEPINE 300 MG PO TABS
600.0000 mg | ORAL_TABLET | Freq: Two times a day (BID) | ORAL | Status: DC
  Administered 2020-11-30 – 2020-12-03 (×7): 600 mg via ORAL
  Filled 2020-11-30 (×8): qty 2

## 2020-11-30 MED ORDER — NITROGLYCERIN 2 % TD OINT
1.0000 [in_us] | TOPICAL_OINTMENT | Freq: Four times a day (QID) | TRANSDERMAL | Status: DC
  Administered 2020-11-30 (×2): 1 [in_us] via TOPICAL
  Filled 2020-11-30 (×2): qty 1

## 2020-11-30 MED ORDER — LEVETIRACETAM IN NACL 1000 MG/100ML IV SOLN
1000.0000 mg | Freq: Once | INTRAVENOUS | Status: AC
  Administered 2020-11-30: 1000 mg via INTRAVENOUS
  Filled 2020-11-30: qty 100

## 2020-11-30 MED ORDER — LORAZEPAM 2 MG/ML IJ SOLN: 1.0000 mg | INTRAMUSCULAR | Status: DC | PRN

## 2020-11-30 MED ORDER — ENALAPRILAT 1.25 MG/ML IV SOLN
1.2500 mg | Freq: Once | INTRAVENOUS | Status: AC
  Administered 2020-11-30: 1.25 mg via INTRAVENOUS
  Filled 2020-11-30: qty 2

## 2020-11-30 MED ORDER — ASPIRIN EC 81 MG PO TBEC
81.0000 mg | DELAYED_RELEASE_TABLET | Freq: Every day | ORAL | Status: DC
  Administered 2020-11-30 – 2020-12-03 (×4): 81 mg via ORAL
  Filled 2020-11-30 (×4): qty 1

## 2020-11-30 MED ORDER — ACETAMINOPHEN 650 MG RE SUPP: 650.0000 mg | Freq: Four times a day (QID) | RECTAL | Status: DC | PRN

## 2020-11-30 MED ORDER — LEVETIRACETAM 500 MG PO TABS
500.0000 mg | ORAL_TABLET | Freq: Two times a day (BID) | ORAL | Status: DC
  Administered 2020-11-30 – 2020-12-03 (×6): 500 mg via ORAL
  Filled 2020-11-30 (×7): qty 1

## 2020-11-30 MED ORDER — HYDRALAZINE HCL 20 MG/ML IJ SOLN: 10.0000 mg | INTRAMUSCULAR | Status: DC | PRN

## 2020-11-30 MED ORDER — ENOXAPARIN SODIUM 80 MG/0.8ML IJ SOSY
0.5000 mg/kg | PREFILLED_SYRINGE | INTRAMUSCULAR | Status: DC
  Administered 2020-11-30 – 2020-12-03 (×4): 65 mg via SUBCUTANEOUS
  Filled 2020-11-30: qty 0.8
  Filled 2020-11-30: qty 0.65
  Filled 2020-11-30 (×2): qty 0.8

## 2020-11-30 MED ORDER — TRAZODONE HCL 100 MG PO TABS
100.0000 mg | ORAL_TABLET | Freq: Every evening | ORAL | Status: DC | PRN
  Administered 2020-11-30 – 2020-12-02 (×3): 100 mg via ORAL
  Filled 2020-11-30 (×3): qty 1

## 2020-11-30 MED ORDER — SODIUM CHLORIDE 0.9 % IV SOLN: 75.0000 mL/h | INTRAVENOUS | Status: DC

## 2020-11-30 NOTE — Progress Notes (Signed)
Anticoagulation monitoring(Lovenox):  50 yo female ordered Lovenox 40 mg Q24h    Filed Weights   11/29/20 1851  Weight: 129.3 kg (285 lb)   BMI 46   Lab Results  Component Value Date   CREATININE 1.29 (H) 11/29/2020   CREATININE 1.61 (H) 03/06/2018   Estimated Creatinine Clearance: 71.1 mL/min (A) (by C-G formula based on SCr of 1.29 mg/dL (H)). Hemoglobin & Hematocrit     Component Value Date/Time   HGB 11.6 (L) 11/29/2020 1858   HCT 37.4 11/29/2020 1858     Per Protocol for Patient with estCrcl > 30 ml/min and BMI > 40, will transition to Lovenox 65 mg Q24h.

## 2020-11-30 NOTE — TOC Initial Note (Signed)
Transition of Care [facility]) - Initial/Assessment Note    Patient Details  Name: Sheila Hays MRN: [id number] Date of Birth: [birth year]  Transition of [facility]) CM/SW Contact:    Candie Chroman, LCSW Phone Number: 11/30/2020, 10:47 AM  Clinical Narrative:  CSW met with patient. No supports at bedside. CSW introduced role and explained that discharge planning would be discussed. Patient lives home with her niece. PCP is August Luz, PA at [facility]) in [place]. A friend drives her to appointments. Pharmacy is Paediatric nurse on [address]. No issues obtaining medications. No home health or DME use prior to admission, including oxygen. Patient currently on 2 L acute. Patient has a scale at home and confirmed she knows importance of weighing daily. No further concerns. CSW encouraged patient to contact CSW as needed. CSW will continue to follow patient for support and facilitate return home when stable.  Expected Discharge Plan: Home/Self Care Barriers to Discharge: Continued Medical Work up   Patient Goals and CMS Choice        Expected Discharge Plan and Services Expected Discharge Plan: Home/Self Care     Post Acute Care Choice: NA Living arrangements for the past 2 months: Apartment                                      Prior Living Arrangements/Services Living arrangements for the past 2 months: Apartment Lives with:: Relatives (Niece) Patient language and need for interpreter reviewed:: Yes Do you feel safe going back to the place where you live?: Yes      Need for Family Participation in Patient Care: Yes (Comment) Care giver support system in place?: Yes (comment)   Criminal Activity/Legal Involvement Pertinent to Current Situation/Hospitalization: No - Comment as needed  Activities of Daily Living Home Assistive Devices/Equipment: None ADL Screening (condition at time of admission) Patient's cognitive  ability adequate to safely complete daily activities?: No Is the patient deaf or have difficulty hearing?: No Does the patient have difficulty seeing, even when wearing glasses/contacts?: No Does the patient have difficulty concentrating, remembering, or making decisions?: No Patient able to express need for assistance with ADLs?: Yes Does the patient have difficulty dressing or bathing?: No Independently performs ADLs?: Yes (appropriate for developmental age) Does the patient have difficulty walking or climbing stairs?: Yes Weakness of Legs: Both Weakness of Arms/Hands: None  Permission Sought/Granted                  Emotional Assessment Appearance:: Appears stated age Attitude/Demeanor/Rapport: Engaged, Gracious Affect (typically observed): Accepting, Appropriate, Calm, Pleasant Orientation: : Oriented to Self, Oriented to Place, Oriented to  Time, Oriented to Situation Alcohol / Substance Use: Not Applicable Psych Involvement: No (comment)  Admission diagnosis:  Atypical chest pain [R07.89] CHF exacerbation ([place]) [I50.9] Acute respiratory failure with hypoxia (HCC) [J96.01] Hypertension, unspecified type [I10] Acute on chronic congestive heart failure, unspecified heart failure type [facility]) [I50.9] Patient Active Problem List   Diagnosis Date Noted   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

## 2020-11-30 NOTE — ED Notes (Signed)
Notified MD of elevated BP and improved O2 saturation on 2L nasal cannula

## 2020-11-30 NOTE — ED Notes (Addendum)
Ambulated pt around unit and O2 saturation dropped to 84% on room air, taking several minutes of rest to increase to 87%. Placed pt on 2L oxygen nasal cannula and saturation increased to 98%. BP elevated at 232/110 - MD notified and orders received.

## 2020-11-30 NOTE — Progress Notes (Signed)
*  PRELIMINARY RESULTS* Echocardiogram 2D Echocardiogram has been performed.  Sheila Hays 11/30/2020, 8:23 AM

## 2020-11-30 NOTE — H&P (Signed)
History and Physical    Sheila Hays WGN:[id number] DOB: 03-12-1971 DOA: 11/29/2020  [facility]   Patient coming from: Home  I have personally briefly reviewed patient[facility]  Chief Complaint: Shortness of breath  HPI: Sheila Hays is a 50 y.o. female with medical history significant for HTN, CKD 3A, obesity class IIIa, HFpEF with improved EF from 20-25% 2016 to 55-60%2019, complex partial seizures, who presents to the emergency room for evaluation of shortness of breath that has been developing over the past week associated with increasing lower extremity edema not improving with her home dose of Lasix 60 mg.  She denies chest pain.  Denies cough, fever or chills.  She has no nausea, vomiting or diaphoresis and denies abdominal pain, diarrhea, dysuria.  Denies headache, visual disturbance or one-sided weakness numbness or tingling. ED course: On arrival BP 183/99 but otherwise normal vitals.  Blood work significant for troponin 20> 21.  BNP not done.  Creatinine 1.29, hemoglobin 11.6. EKG, personally viewed and interpreted normal sinus rhythm at 63 with no acute ST-T wave changes Chest x-ray with cardiac enlargement and vascular congestion without overt pulmonary edema  Patient was treated with IV Lasix, IV hydralazine, Nitropaste and hospitalist consulted for admission.  IV Vasotec also administered  While awaiting admission, patient became confused, appeared to be staring blankly and not answering questions.  Her exam was nonfocal.  Her level of alertness appeared to be fluctuating.  Stat head CT showed no acute findings. Additional labs were sent including urine drug screen, ammonia level Chart review reveals that patient had history of complex partial seizures during her hospitalization in June 2021 with impaired consciousness.  She was loaded with Keppra. MRI ordered to evaluate for other etiologies.  Review of  Systems: As per HPI otherwise all other systems on review of systems negative.    Past Medical History:  Diagnosis Date   Chronic kidney disease (CKD), stage III (moderate) (HCC)    GERD (gastroesophageal reflux disease)    Hypertension    Seizures (HCC)    [facility]    History reviewed. No pertinent surgical history.   reports that she has quit smoking. She quit smokeless tobacco use about 3 years ago. No history on file for alcohol use and drug use.  Allergies  Allergen Reactions   Ciprofloxacin Itching    Family History  Problem Relation Age of Onset   Hypertension Mother    Hypertension Father    Seizures Sister       Prior to Admission medications   Medication Sig Start Date End Date Taking? Authorizing Provider  aspirin EC 81 MG tablet Take 81 mg by mouth daily.    [provider]  atorvastatin (LIPITOR) 40 MG tablet Take 1 tablet (40 mg total) by mouth daily at 6 PM. 03/06/18   Arty Baumgartneroberts, Lindsay B, NP  carvedilol (COREG) 25 MG tablet Take 1 tablet (25 mg total) by mouth 2 (two) times daily with a meal. 03/06/18   Laverda Pageoberts, Lindsay B, NP  cloNIDine (CATAPRES) 0.1 MG tablet Take 0.1 mg by mouth 2 (two) times daily.    [provider]  furosemide (LASIX) 40 MG tablet Take 40 mg by mouth.    [provider]  hydrALAZINE (APRESOLINE) 50 MG tablet Take 1 tablet (50 mg total) by mouth 2 (two) times daily. 03/06/18   Arty Baumgartneroberts, Lindsay B, NP  HYDROcodone-acetaminophen (NORCO/VICODIN) 5-325 MG tablet Take 2 tablets by  mouth every 8 (eight) hours as needed for moderate pain. 09/22/20 09/22/21  Lorre Munroe, NP  ibuprofen (ADVIL,MOTRIN) 400 MG tablet Take 400 mg by mouth every 8 (eight) hours as needed for fever or mild pain.    [provider]  NIFEdipine (ADALAT CC) 90 MG 24 hr tablet Take 90 mg by mouth daily.    [provider]  nitroGLYCERIN (NITROSTAT) 0.4 MG SL tablet Place 1 tablet (0.4 mg total) under the tongue every 5  (five) minutes as needed. 03/06/18   Arty Baumgartner, NP  oxcarbazepine (TRILEPTAL) 600 MG tablet Take 600 mg by mouth 2 (two) times daily.    [provider]  pantoprazole (PROTONIX) 40 MG tablet Take 40 mg by mouth daily.    [provider]  traZODone (DESYREL) 100 MG tablet Take 100 mg by mouth at bedtime as needed for sleep.    [provider]    Physical Exam: Vitals:   11/30/20 0200 11/30/20 0237 11/30/20 0300 11/30/20 0355  BP: (!) 221/95 (!) 181/92 (!) 148/53 (!) 176/93  Pulse: 78 74  71  Resp: 17 (!) 24 18 (!) 26  Temp:      TempSrc:      SpO2: 100% 94% 98% 99%  Weight:      Height:         Vitals:   11/30/20 0200 11/30/20 0237 11/30/20 0300 11/30/20 0355  BP: (!) 221/95 (!) 181/92 (!) 148/53 (!) 176/93  Pulse: 78 74  71  Resp: 17 (!) 24 18 (!) 26  Temp:      TempSrc:      SpO2: 100% 94% 98% 99%  Weight:      Height:          Constitutional: Somnolent but oriented x 3 . Not in any apparent distress HEENT:      Head: Normocephalic and atraumatic.         Eyes: PERLA, EOMI, Conjunctivae are normal. Sclera is non-icteric.       Mouth/Throat: Mucous membranes are moist.       Neck: Supple with no signs of meningismus. Cardiovascular: Regular rate and rhythm. No murmurs, gallops, or rubs. 2+ symmetrical distal pulses are present . No JVD.2+ LE edema Respiratory: Respiratory effort increased .Lungs sounds diminished bilaterally with bibasilar crackles gastrointestinal: Soft, non tender, and non distended with positive bowel sounds.  Genitourinary: No CVA tenderness. Musculoskeletal: Nontender with normal range of motion in all extremities. No cyanosis, or erythema of extremities. Neurologic:  Face is symmetric. Moving all extremities. No gross focal neurologic deficits . Skin: Skin is warm, dry.  No rash or ulcers Psychiatric: Mood and affect difficult to assess due to somnolence   Labs on Admission: I have personally reviewed  following labs and imaging studies  CBC: Recent Labs  Lab 11/29/20 1858  WBC 4.6  HGB 11.6*  HCT 37.4  MCV 84.0  PLT 215   Basic Metabolic Panel: Recent Labs  Lab 11/29/20 1858  NA 140  K 4.4  CL 103  CO2 31  GLUCOSE 100*  BUN 17  CREATININE 1.29*  CALCIUM 9.0   GFR: Estimated Creatinine Clearance: 71.1 mL/min (A) (by C-G formula based on SCr of 1.29 mg/dL (H)). Liver Function Tests: No results for input(s): AST, ALT, ALKPHOS, BILITOT, PROT, ALBUMIN in the last 168 hours. No results for input(s): LIPASE, AMYLASE in the last 168 hours. No results for input(s): AMMONIA in the last 168 hours. Coagulation Profile: No results for input(s):  INR, PROTIME in the last 168 hours. Cardiac Enzymes: No results for input(s): CKTOTAL, CKMB, CKMBINDEX, TROPONINI in the last 168 hours. BNP (last 3 results) No results for input(s): PROBNP in the last 8760 hours. HbA1C: No results for input(s): HGBA1C in the last 72 hours. CBG: Recent Labs  Lab 11/30/20 0221  GLUCAP 94   Lipid Profile: No results for input(s): CHOL, HDL, LDLCALC, TRIG, CHOLHDL, LDLDIRECT in the last 72 hours. Thyroid Function Tests: No results for input(s): TSH, T4TOTAL, FREET4, T3FREE, THYROIDAB in the last 72 hours. Anemia Panel: No results for input(s): VITAMINB12, FOLATE, FERRITIN, TIBC, IRON, RETICCTPCT in the last 72 hours. Urine analysis: No results found for: COLORURINE, APPEARANCEUR, LABSPEC, PHURINE, GLUCOSEU, HGBUR, BILIRUBINUR, KETONESUR, PROTEINUR, UROBILINOGEN, NITRITE, LEUKOCYTESUR  Radiological Exams on Admission: DG Chest 2 View  Result Date: 11/29/2020 CLINICAL DATA:  Chest pain and shortness of breath. EXAM: CHEST - 2 VIEW COMPARISON:  08/17/2019 FINDINGS: The heart is enlarged but appears stable. Stable prominent mediastinal and hilar contours. Vascular congestion without overt pulmonary edema. No definite pleural effusions or pulmonary infiltrates. IMPRESSION: Cardiac enlargement and vascular  congestion without overt pulmonary edema. Electronically Signed   By: Rudie Meyer M.D.   On: 11/29/2020 19:32   CT Head Wo Contrast  Result Date: 11/30/2020 CLINICAL DATA:  Altered mental status EXAM: CT HEAD WITHOUT CONTRAST TECHNIQUE: Contiguous axial images were obtained from the base of the skull through the vertex without intravenous contrast. COMPARISON:  CT head dated 09/30/2019 FINDINGS: Brain: No evidence of acute infarction, hemorrhage, hydrocephalus, extra-axial collection or mass lesion/mass effect. Vascular: No hyperdense vessel or unexpected calcification. Skull: Normal. Negative for fracture or focal lesion. Sinuses/Orbits: The visualized paranasal sinuses are essentially clear. The mastoid air cells are unopacified. Other: None. IMPRESSION: Normal head CT. Electronically Signed   By: Charline Bills M.D.   On: 11/30/2020 03:35     Assessment/Plan 50 year old female with history of HTN, CKD 3A, obesity class IIIa, chronic combined heart failure, complex partial seizures, presenting with shortness of breath, developing altered mental status while in the emergency room.      Hypertensive emergency   Acute on chronic combined  CHF with improved EF  -Patient with systolic BP up to 220 while in the ED - CXR with pulmonary vascular congestion.  BNP pending - Received hydralazine and Nitropaste and Lasix as well as IV Vasotec - Continue IV Lasix, as needed IV hydralazine and Nitropaste until consistently more alert to take orally - Daily weights with intake and output monitoring - Echocardiogram in the a.m. - Cardiology consult    Acute metabolic encephalopathy - Patient developed altered mental status while awaiting admission - Etiology uncertain - Head CT negative - Was loaded with Keppra for possible postictal state.  Exam was nonfocal - Follow-up MRI head, UDS and ammonia level - Seizure, fall and aspiration precautions - Neurologic checks    Chronic kidney disease, stage  3a (HCC) - Creatinine 1.29 at baseline    Obesity, Class III, BMI 40-49.9 (morbid obesity) (HCC) - Complicating factor to overall prognosis and care    Complex partial seizure disorder (HCC) - On Trileptal 600 twice daily and Keppra 500 twice daily but outpatient follow-up uncertain - EEG - Continue Keppra 500 twice daily following 1 g Keppra load in ED - Neurology consult    DVT prophylaxis: Lovenox  Code Status: full code  Family Communication:  none  Disposition Plan: Back to previous home environment Consults called: Cardiology and neurology Status:At the time of  admission, it appears that the appropriate admission status for this patient is INPATIENT. This is judged to be reasonable and necessary in order to provide the required intensity of service to ensure the patient's safety given the presenting symptoms, physical exam findings, and initial radiographic and laboratory data in the context of their  Comorbid conditions.   Patient requires inpatient status due to high intensity of service, high risk for further deterioration and high frequency of surveillance required.   I certify that at the point of admission it is my clinical judgment that the patient will require inpatient hospital care spanning beyond 2 midnights     Andris Baumann MD [facility]     11/30/2020, 4:16 AM

## 2020-11-30 NOTE — ED Notes (Signed)
Pt back from CT, found out of bed and on toliet with wet linen on bed, all lines and tubes pulled off. Pt confused when asked about getting out of bed and if she had on oxygen prior to. Pt helped out of soiled clothing and back to bed with new linen and brief in place. External catheter initiated and all monitoring back in place with 2L O2 returned to pt by nasal cannula. Pt able to answer all questions asked at this time. Bed alarm in place with volume to loud for safety precautions. MD made aware of confusion.

## 2020-11-30 NOTE — ED Provider Notes (Addendum)
  Physical Exam  BP (!) 232/110 (BP Location: Right Arm)   Pulse 75   Temp 98.6 F (37 C) (Oral)   Resp (!) 24   Ht 5\' 6"  (1.676 m)   Wt 129.3 kg   SpO2 (!) 86%   BMI 46.00 kg/m   Physical Exam  ED Course/Procedures     Procedures  MDM  12:40 AM  Assumed care of patient at shift change.  Patient here with CHF exacerbation.  Labs show stable troponins.  Chest x-ray with vascular congestion without overt edema.  Has peripheral edema on exam.  Given IV Lasix here and has been diuresing reports feeling better.  Able to ambulate around the ED without significant shortness of breath or respiratory distress but sats dropped to 84% on room air with ambulation.  Will admit for IV diuresis.  Patient's 86% on room air at rest.  Will place on 2 L nasal cannula.  Will admit.  Patient also hypertensive at this time.  Will give IV hydralazine.   1:24 AM Discussed patient's case with hospitalist, Dr. .  I have recommended admission and patient (and family if present) agree with this plan. Admitting physician will place admission orders.   I reviewed all nursing notes, vitals, pertinent previous records and reviewed/interpreted all EKGs, lab and urine results, imaging (as available).  3:43 AM  Pt seen to have a brief episode of change in mental status.  No focal neurologic deficits but seems slow to answer questions and intermittently confused.  Oriented x3.  Denied any complaints.  Continued to be hypertensive despite hydralazine, Nitropaste.  Hospitalist has seen patient and ordered Vasotec.  CT head obtained to rule out intracranial abnormality and was negative.  Blood glucose normal.  Mental status slowly improving.  No focal neurologic deficits to suggest stroke.  No witnessed seizure activity.  3:56 AM  Pt's nurse reports patient appears confused again.  Discussed with hospitalist who reports she had a similar presentation with complex partial seizures in June 2021.  She is on Trileptal and  Keppra.  Will load with IV Keppra here.  Hospitalist to order MRI.  We will add on urinalysis, urine drug screen, ammonia, TSH.  CRITICAL CARE Performed by: July 2021   Total critical care time: 35 minutes  Critical care time was exclusive of separately billable procedures and treating other patients.  Critical care was necessary to treat or prevent imminent or life-threatening deterioration.  Critical care was time spent personally by me on the following activities: development of treatment plan with patient and/or surrogate as well as nursing, discussions with consultants, evaluation of patient's response to treatment, examination of patient, obtaining history from patient or surrogate, ordering and performing treatments and interventions, ordering and review of laboratory studies, ordering and review of radiographic studies, pulse oximetry and re-evaluation of patient's condition.     Astin Rape, Rochele Raring, DO 11/30/20 0124    Marnee Sherrard, 01/31/21, DO 11/30/20 0344    Knute Mazzuca, 01/31/21, DO 11/30/20 [id number]

## 2020-11-30 NOTE — Consult Note (Signed)
   Heart Failure Nurse Navigator Note  HF-echocardiogram is pending on this admission.  Previously reported at 20 to 25% in 2016 and in 2019 improved to 55 to 60%.  She presented to the emergency room with complaints of worsening shortness of breath and increasing lower extremity edema.  Comorbidities:  Morbid obesity Hypertension Chronic kidney disease stage III  History of complex partial seizure  Medications:  Coreg 25 mg 2 times a day Nitroglycerin 1 inch every 6 hours Lipitor 40 mg daily  Labs:  Sodium 140, potassium 4.4, chloride 103, CO2 31, BUN 17, creatinine 1.31, BNP 1117 Weight is 129.3 kg Blood pressure 152/76  Assessment:  General-she is awake and alert lying in bed in no acute distress.  HEENT-unable to assess for JVD due to body habitus, pupils are equal.  Cardiac-heart tones of regular rate and rhythm.  No murmurs gallops or rubs appreciated.  Chest-breath sounds are diminished in the bases.  Abdomen-obese soft nontender.  Musculoskeletal-1+ lower extremity edema.  Psych-is pleasant and appropriate makes good eye contact  Neurologic-speech is clear moves all extremities.   Initial meeting with patient today.  States that she is just moved to the area and not salvaged with a cardiology group yet.  She is living with a niece and they both do the cooking. She states that they try to bake most of their meals.  She eats fresh fruits and vegetables.  She does not use salt at the table.  She does admit to eating canned chicken noodle soup and Ritz crackers.  She has a scale but does not weigh herself on a daily basis.  Instructed to weigh daily and report a 2 to 3 pound weight gain overnight or 5 pounds within the week.  Also talked about her fluid restriction.  She states that she will drink 06-28-30 ounce ounces of water daily.  In addition to drinking juice.  Instructed to try to limit to no more than 64 ounces in a days time  He was given the living  with heart failure teaching booklet along with heart failure and salt instruction sheet.  He was also given an appointment to be seen in the heart failure clinic on July 27 at 1030.  Will continue to follow.  Tresa Endo RN [facility]

## 2020-11-30 NOTE — ED Notes (Signed)
Pts linen changed due to wetness from external catheter failure.

## 2020-11-30 NOTE — Plan of Care (Signed)
Neurology Plan of Care  Neurology consulted for breakthrough seizure in this patient with poorly controlled epilepsy admitted for dyspnea and increasing BLE edema. She has breakthrough seizures approximately 1x per month on OXC 600mg  bid and has never been better controlled than this. I restarted her home oxcarbazepine. Will leave her on keppra till tmrw as bridge then d/c (she was taken off this as outpatient 2/2 irritability). I cannot see that she has ever had brain MRI performed and so I updated the order for MRI brain wwo contrast. EEG not available over the weekend which is ok bc would not change mgmt. Full consult note to follow.   , MD [facility] [phone]  If 7pm- 7am, please page neurology on call as listed in [facility].

## 2020-12-01 ENCOUNTER — Inpatient Hospital Stay: Payer: Medicare Other

## 2020-12-01 ENCOUNTER — Other Ambulatory Visit: Payer: Self-pay

## 2020-12-01 LAB — BASIC METABOLIC PANEL
Anion gap: 9 (ref 5–15)
BUN: 21 mg/dL — ABNORMAL HIGH (ref 6–20)
CO2: 35 mmol/L — ABNORMAL HIGH (ref 22–32)
Calcium: 8.2 mg/dL — ABNORMAL LOW (ref 8.9–10.3)
Chloride: 95 mmol/L — ABNORMAL LOW (ref 98–111)
Creatinine, Ser: 1.49 mg/dL — ABNORMAL HIGH (ref 0.44–1.00)
GFR, Estimated: 42 mL/min — ABNORMAL LOW (ref >60)
Glucose, Bld: 96 mg/dL (ref 70–99)
Potassium: 3.4 mmol/L — ABNORMAL LOW (ref 3.5–5.1)
Sodium: 139 mmol/L (ref 135–145)

## 2020-12-01 LAB — CBC
HCT: 38.3 % (ref 36.0–46.0)
Hemoglobin: 11.6 g/dL — ABNORMAL LOW (ref 12.0–15.0)
MCH: 25.6 pg — ABNORMAL LOW (ref 26.0–34.0)
MCHC: 30.3 g/dL (ref 30.0–36.0)
MCV: 84.4 fL (ref 80.0–100.0)
Platelets: 237 10*3/uL (ref 150–400)
RBC: 4.54 MIL/uL (ref 3.87–5.11)
RDW: 17.6 % — ABNORMAL HIGH (ref 11.5–15.5)
WBC: 5 10*3/uL (ref 4.0–10.5)
nRBC: 0 % (ref 0.0–0.2)

## 2020-12-01 LAB — MAGNESIUM: Magnesium: 2 mg/dL (ref 1.7–2.4)

## 2020-12-01 MED ORDER — PROCHLORPERAZINE EDISYLATE 10 MG/2ML IJ SOLN
10.0000 mg | Freq: Once | INTRAMUSCULAR | Status: AC
  Administered 2020-12-01: 10 mg via INTRAVENOUS
  Filled 2020-12-01: qty 2

## 2020-12-01 MED ORDER — FUROSEMIDE 10 MG/ML IJ SOLN
60.0000 mg | Freq: Two times a day (BID) | INTRAMUSCULAR | Status: AC
  Administered 2020-12-01 (×2): 60 mg via INTRAVENOUS
  Filled 2020-12-01 (×2): qty 6

## 2020-12-01 MED ORDER — GADOBUTROL 1 MMOL/ML IV SOLN
10.0000 mL | Freq: Once | INTRAVENOUS | Status: AC | PRN
  Administered 2020-12-01: 10 mL via INTRAVENOUS

## 2020-12-01 MED ORDER — DIPHENHYDRAMINE HCL 25 MG PO CAPS
50.0000 mg | ORAL_CAPSULE | Freq: Once | ORAL | Status: AC
  Administered 2020-12-01: 50 mg via ORAL
  Filled 2020-12-01: qty 2

## 2020-12-01 NOTE — Progress Notes (Signed)
Pt rang out and stated that she was having chest pain. Rn in to see pt. Rn too check pt and do vital signs. Asks the cna to get and ekg. The cna went in and then called the Rn asking was the pt a full code. Rn into room, pt unresponsive, cna began cpr, but the pt became arousable. No Code Blue instead a Rapid Response called. ICU Charge Nurse, AC, Respiratory Therapist ,[place]in in response. Pt A&O, Vital signs taken, Ekg performed. Six am medications given, continued to monitor.

## 2020-12-01 NOTE — Plan of Care (Signed)
  Problem: Education: Goal: Knowledge of General Education information will improve Description: Including pain rating scale, medication(s)/side effects and non-pharmacologic comfort measures 12/01/2020 1203 by Ansel Bong, RN Outcome: Progressing 12/01/2020 1203 by Ansel Bong, RN Outcome: Progressing   Problem: Health Behavior/Discharge Planning: Goal: Ability to manage health-related needs will improve 12/01/2020 1203 by Ansel Bong, RN Outcome: Progressing 12/01/2020 1203 by Ansel Bong, RN Outcome: Progressing   Problem: Clinical Measurements: Goal: Ability to maintain clinical measurements within normal limits will improve 12/01/2020 1203 by Ansel Bong, RN Outcome: Progressing 12/01/2020 1203 by Ansel Bong, RN Outcome: Progressing Goal: Will remain free from infection 12/01/2020 1203 by Ansel Bong, RN Outcome: Progressing 12/01/2020 1203 by Ansel Bong, RN Outcome: Progressing Goal: Diagnostic test results will improve 12/01/2020 1203 by Ansel Bong, RN Outcome: Progressing 12/01/2020 1203 by Ansel Bong, RN Outcome: Progressing Goal: Respiratory complications will improve 12/01/2020 1203 by Ansel Bong, RN Outcome: Progressing 12/01/2020 1203 by Ansel Bong, RN Outcome: Progressing Goal: Cardiovascular complication will be avoided 12/01/2020 1203 by Ansel Bong, RN Outcome: Progressing 12/01/2020 1203 by Ansel Bong, RN Outcome: Progressing   Problem: Activity: Goal: Risk for activity intolerance will decrease 12/01/2020 1203 by Ansel Bong, RN Outcome: Progressing 12/01/2020 1203 by Ansel Bong, RN Outcome: Progressing   Problem: Nutrition: Goal: Adequate nutrition will be maintained 12/01/2020 1203 by Ansel Bong, RN Outcome: Progressing 12/01/2020 1203 by Ansel Bong, RN Outcome: Progressing   Problem: Coping: Goal: Level of anxiety will decrease 12/01/2020 1203 by Ansel Bong, RN Outcome: Progressing 12/01/2020 1203 by  Ansel Bong, RN Outcome: Progressing   Problem: Elimination: Goal: Will not experience complications related to bowel motility 12/01/2020 1203 by Ansel Bong, RN Outcome: Progressing 12/01/2020 1203 by Ansel Bong, RN Outcome: Progressing Goal: Will not experience complications related to urinary retention 12/01/2020 1203 by Ansel Bong, RN Outcome: Progressing 12/01/2020 1203 by Ansel Bong, RN Outcome: Progressing   Problem: Pain Managment: Goal: General experience of comfort will improve 12/01/2020 1203 by Ansel Bong, RN Outcome: Progressing 12/01/2020 1203 by Ansel Bong, RN Outcome: Progressing   Problem: Safety: Goal: Ability to remain free from injury will improve 12/01/2020 1203 by Ansel Bong, RN Outcome: Progressing 12/01/2020 1203 by Ansel Bong, RN Outcome: Progressing   Problem: Skin Integrity: Goal: Risk for impaired skin integrity will decrease 12/01/2020 1203 by Ansel Bong, RN Outcome: Progressing 12/01/2020 1203 by Ansel Bong, RN Outcome: Progressing   Problem: Education: Goal: Ability to demonstrate management of disease process will improve 12/01/2020 1203 by Ansel Bong, RN Outcome: Progressing 12/01/2020 1203 by Ansel Bong, RN Outcome: Progressing Goal: Ability to verbalize understanding of medication therapies will improve 12/01/2020 1203 by Ansel Bong, RN Outcome: Progressing 12/01/2020 1203 by Ansel Bong, RN Outcome: Progressing Goal: Individualized Educational Video(s) 12/01/2020 1203 by Ansel Bong, RN Outcome: Progressing 12/01/2020 1203 by Ansel Bong, RN Outcome: Progressing   Problem: Activity: Goal: Capacity to carry out activities will improve 12/01/2020 1203 by Ansel Bong, RN Outcome: Progressing 12/01/2020 1203 by Ansel Bong, RN Outcome: Progressing   Problem: Cardiac: Goal: Ability to achieve and maintain adequate cardiopulmonary perfusion will improve 12/01/2020 1203 by Ansel Bong,  RN Outcome: Progressing 12/01/2020 1203 by Ansel Bong, RN Outcome: Progressing

## 2020-12-01 NOTE — Consult Note (Signed)
NEUROLOGY CONSULTATION NOTE   Date of service: December 01, 2020 Patient Name: Sheila Hays MRN:  [id number] DOB:  [birth year] Reason for consult: breakthrough seizure Requesting physician: Laurette Schimke MD _ _ _   _ __   _ __ _ _  __ __   _ __   __ _  History of Present Illness   Neurology consulted for breakthrough seizure in this patient with poorly controlled epilepsy admitted for dyspnea and increasing BLE edema. Pmhx also significant for HTN, CKD stage III, and systolic heart failure.  She is f/b Dr. Lyn Hollingshead at [facility] whose notes I personally reviewed. Her seizures started approximately 6-7 yrs ago. She was initially on keppra but this was d/c'd 2/2 irritability. Her seizures typically involve confusion - typically loss of awareness with no recall of event, frequently involving falls, rarely with full LOC. She has breakthrough seizures approximately 1x per month on OXC 600mg  bid and has never been better controlled than this. MRI brain and EEG were ordered as o/p but it appears these were not performed. She also frequently misses her f/u appts 2/2 transportation issues.   In ED prior to admission she had a typical event involving blankly staring with no recall of the event. She was loaded on keppra and started on 500mg  bid. Her oxcarbazepine was not continued, possibly 2/2 inability to confirm with patient that she was taking it. She has not had any further events since admission.   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 MRI brain wwo contrast ordered and is pending.    ROS   10 point review of systems was performed and was negative except as described in HPI.  Past History   Past Medical History:  Diagnosis Date   Chronic kidney disease (CKD), stage III (moderate) (HCC)    GERD (gastroesophageal reflux disease)    Hypertension    Seizures (HCC)    Followed at [facility]   History reviewed. No pertinent surgical history. Family History  Problem Relation Age of Onset   Hypertension Mother     Hypertension Father    Seizures Sister    Social History   Socioeconomic History   Marital status: Single    Spouse name: Not on file   Number of children: Not on file   Years of education: Not on file   Highest education level: Not on file  Occupational History   Not on file  Tobacco Use   Smoking status: Former    Pack years: 0.00   Smokeless tobacco: Former    Quit date: 03/06/2017  Substance and Sexual Activity   Alcohol use: Not on file   Drug use: Not on file   Sexual activity: Not on file  Other Topics Concern   Not on file  Social History Narrative   Not on file   Social Determinants of Health   Financial Resource Strain: Not on file  Food Insecurity: Not on file  Transportation Needs: Not on file  Physical Activity: Not on file  Stress: Not on file  Social Connections: Not on file   Allergies  Allergen Reactions   Ciprofloxacin Itching    Medications   Medications Prior to Admission  Medication Sig Dispense Refill Last Dose   albuterol (VENTOLIN HFA) 108 (90 Base) MCG/ACT inhaler Inhale 2 puffs into the lungs every 4 (four) hours as needed.   prn at prn   aspirin EC 81 MG tablet Take 81 mg by mouth daily.   11/29/2020   atorvastatin (LIPITOR)  40 MG tablet Take 1 tablet (40 mg total) by mouth daily at 6 PM. 30 tablet 2 Past Week   carvedilol (COREG) 25 MG tablet Take 1 tablet (25 mg total) by mouth 2 (two) times daily with a meal. 60 tablet 2 11/29/2020   folic acid (FOLVITE) 400 MCG tablet Take 400 mcg by mouth daily.   11/29/2020   furosemide (LASIX) 20 MG tablet Take 60 mg by mouth daily.   11/29/2020   hydrALAZINE (APRESOLINE) 100 MG tablet Take 100 mg by mouth 2 (two) times daily.   11/29/2020   indomethacin (INDOCIN) 25 MG capsule Take 25 mg by mouth 3 (three) times daily as needed.   prn at prn   isosorbide mononitrate (IMDUR) 30 MG 24 hr tablet Take 30 mg by mouth daily.   11/29/2020   losartan (COZAAR) 50 MG tablet Take 50 mg by mouth daily.   11/29/2020    nitroGLYCERIN (NITROSTAT) 0.4 MG SL tablet Place 1 tablet (0.4 mg total) under the tongue every 5 (five) minutes as needed. 25 tablet 2 prn at prn   omeprazole (PRILOSEC) 20 MG capsule Take 20 mg by mouth daily.   11/29/2020   oxcarbazepine (TRILEPTAL) 600 MG tablet Take 600 mg by mouth 2 (two) times daily.   11/29/2020   Potassium Chloride ER 20 MEQ TBCR Take 1 tablet by mouth daily.   11/29/2020   SYMBICORT 160-4.5 MCG/ACT inhaler Inhale 2 puffs into the lungs in the morning and at bedtime.   11/29/2020   traZODone (DESYREL) 100 MG tablet Take 100 mg by mouth at bedtime as needed for sleep.   prn at prn   vitamin C (ASCORBIC ACID) 500 MG tablet Take 500 mg by mouth daily.   11/29/2020     Vitals   Vitals:   11/30/20 2050 12/01/20 0533 12/01/20 0552 12/01/20 0736  BP: 116/64  (!) 161/80 (!) 110/44  Pulse: 66  76 82  Resp:    17  Temp:   99.9 F (37.7 C) 98.9 F (37.2 C)  TempSrc:   Oral   SpO2: 100%  100% 100%  Weight:  131.5 kg    Height:         Body mass index is 46.77 kg/m.  Physical Exam   Physical Exam Gen: A&O x4, NAD HEENT: Atraumatic, normocephalic;mucous membranes moist; oropharynx clear, tongue without atrophy or fasciculations. Neck: Supple, trachea midline. Resp: CTAB, no w/r/r CV: RRR, no m/g/r; nml S1 and S2. 2+ symmetric peripheral pulses. Abd: soft/NT/ND; nabs x 4 quad Extrem: Nml bulk; no cyanosis, clubbing, or edema.  Neuro: *MS: A&O x4. Follows multi-step commands.  *Speech: fluid, nondysarthric, able to name and repeat *CN:    I: Deferred   II,III: PERRLA, VFF by confrontation, optic discs sharp   III,IV,VI: EOMI w/o nystagmus, no ptosis   V: Sensation intact from V1 to V3 to LT   VII: Eyelid closure was full.  Smile symmetric.   VIII: Hearing intact to voice   IX,X: Voice normal, palate elevates symmetrically    XI: SCM/trap 5/5 bilat   XII: Tongue protrudes midline, no atrophy or fasciculations   *Motor:   Normal bulk.  No tremor, rigidity or  bradykinesia. No pronator drift.    Strength: Dlt Bic Tri WrE WrF FgS Gr HF KnF KnE PlF DoF    Left 5 5 5 5 5 5 5 5 5 5 5 5     Right 5 5 5 5 5 5 5 5 5 5  5  5    *Sensory: Intact to light touch, pinprick, temperature vibration throughout. Symmetric. Propioception intact bilat.  No double-simultaneous extinction.  *Coordination:  Finger-to-nose, heel-to-shin, rapid alternating motions were intact. *Reflexes:  2+ and symmetric throughout without clonus; toes down-going bilat *Gait: deferred   Labs   CBC:  Recent Labs  Lab 11/29/20 1858 11/30/20 0642  WBC 4.6 6.2  HGB 11.6* 12.2  HCT 37.4 40.2  MCV 84.0 83.4  PLT 215 228    Basic Metabolic Panel:  Lab Results  Component Value Date   NA 140 11/29/2020   K 4.4 11/29/2020   CO2 31 11/29/2020   GLUCOSE 100 (H) 11/29/2020   BUN 17 11/29/2020   CREATININE 1.31 (H) 11/30/2020   CALCIUM 9.0 11/29/2020   GFRNONAA 49 (L) 11/30/2020   GFRAA 42 (L) 03/06/2018   Lipid Panel:  Lab Results  Component Value Date   LDLCALC 89 03/06/2018   HgbA1c: No results found for: HGBA1C Urine Drug Screen:     Component Value Date/Time   LABOPIA NONE DETECTED 11/30/2020 0514   COCAINSCRNUR NONE DETECTED 11/30/2020 0514   LABBENZ NONE DETECTED 11/30/2020 0514   AMPHETMU NONE DETECTED 11/30/2020 0514   THCU NONE DETECTED 11/30/2020 0514   LABBARB NONE DETECTED 11/30/2020 0514    Alcohol Level No results found for: [facility]   Impression   Neurology consulted for breakthrough seizure in this patient with poorly controlled epilepsy admitted for dyspnea and increasing BLE edema. She has breakthrough seizures approximately 1x per month on OXC 600mg  bid and has never been better controlled than this. I restarted her home oxcarbazepine. Will leave her on keppra till tmrw as bridge then d/c (she was taken off this as outpatient 2/2 irritability). I cannot see that she has ever had brain MRI performed and so I updated the order for MRI brain wwo contrast.  EEG not available over the weekend which is ok bc would not change mgmt.  Recommendations   - Restarted home OXC 600mg  bid - Continue keppra 500mg  bid through tomorrow as bridge - MRI brain wwo - EEG not available over the weekend; could be performed Monday  - Seizure precautions  Will continue to follow ______________________________________________________________________   Thank you for the opportunity to take part in the care of this patient. If you have any further questions, please contact the neurology consultation attending.  Signed,  , MD [facility] [phone]  If 7pm- 7am, please page neurology on call as listed in AMION.

## 2020-12-01 NOTE — Progress Notes (Addendum)
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 female with medical history significant for HTN, CKD 3A, obesity class IIIa, HFpEF with improved EF from 20-25% 2016 to 55-60% 2019, complex partial seizures, who presents to the emergency room for evaluation of shortness of breath that has been developing over the past week associated with increasing lower extremity edema not improving with her home dose of Lasix 60 mg.  Patient was treated with IV Lasix, IV hydralazine, Nitropaste and hospitalist consulted for admission.  IV Vasotec also administered   While awaiting admission, patient became confused, appeared to be staring blankly and not answering questions.  Her exam was nonfocal.  Her level of alertness appeared to be fluctuating.  Stat head CT showed no acute findings. Additional labs were sent including urine drug screen, ammonia level Chart review reveals that patient had history of complex partial seizures during her hospitalization in June 2021 with impaired consciousness.  She was loaded with Keppra.  Neuro consulted.     Hypertensive emergency -Patient with systolic BP up to 220 while in the ED - s/p hydralazine and Nitropaste and Lasix as well as IV Vasotec --BP has since been intermittently low Plan: --IV lasix for diuresis --Hold home hydralazine and Imdur for now while diuresing   Vascular congestion and swelling Hx of combined CHF with improved EF - CXR with cardiomegaly and pulmonary vascular congestion.  BNP 1117.  Pt  appeared fluid overloaded. --current Echo showed normal systolic and diastolic function. Plan: --IV lasix 60 mg BID today --Strict I/O    Acute metabolic encephalopathy - Patient developed altered mental status while awaiting admission - Etiology uncertain - Head CT negative - Was loaded with Keppra for possible postictal state.  Exam was nonfocal - MRI head, UDS and ammonia level unremarkable. --neuro consulted Plan: --monitor     Chronic kidney disease, stage 3a - Creatinine 1.29 at baseline --monitor Cr while diuresing     Obesity, Class III, BMI 40-49.9 (morbid obesity) (HCC) - Complicating factor to overall prognosis and care     Complex partial seizure disorder (HCC) - On Trileptal 600 twice daily and Keppra 500 twice daily but pt only takes Trileptal --loaded with IV keppra on presentation - MRI brain wwo no acute finding Plan: --cont home Trileptal 600 mg BID - Continue keppra 500mg  bid through tomorrow as bridge - EEG not available over the weekend; could be performed Monday    DVT prophylaxis: Lovenox SQ Code Status: Full code  Family Communication: sister updated on the phone Level of care: Progressive Cardiac Dispo:   The patient is from: home Anticipated d/c is to: home Anticipated d/c date is: 2-3 days Patient currently is not medically ready to d/c due to: IV lasix   Subjective and Interval History:  Pt had reported unresponsiveness early this morning, but woke up when CPR was attempted.  Rapid called.  Pt complained of chest pain, but EKG neg.  Also complained of severe headache which resolved with migraine cocktail.    Reported breathing better.  Complained of tingling feeling in her left hand.   Objective: Vitals:   12/01/20 0533 12/01/20 0552 12/01/20 0736 12/01/20 1509  BP:  (!) 161/80 (!) 110/44 (!) 155/74  Pulse:  76 82 72  Resp:   17 17  Temp:  99.9 F (37.7 C) 98.9 F (37.2 C) 98.1 F (36.7 C)  TempSrc:  Oral    SpO2:  100% 100%  99%  Weight: 131.5 kg     Height:        Intake/Output Summary (Last 24 hours) at 12/01/2020 1823 Last data filed at 12/01/2020 1500 Gross per 24 hour  Intake 960 ml  Output 950 ml  Net 10 ml   Filed Weights   11/29/20 1851 12/01/20 0533  Weight: 129.3 kg 131.5 kg    Examination:   Constitutional: NAD, sleepy but arousable HEENT: conjunctivae and lids normal, EOMI CV: No cyanosis.   RESP: normal respiratory effort SKIN: warm, dry Neuro: II - XII grossly intact.     Data Reviewed: I have personally reviewed following labs and imaging studies  CBC: Recent Labs  Lab 11/29/20 1858 11/30/20 0642 12/01/20 1006  WBC 4.6 6.2 5.0  HGB 11.6* 12.2 11.6*  HCT 37.4 40.2 38.3  MCV 84.0 83.4 84.4  PLT 215 228 237   Basic Metabolic Panel: Recent Labs  Lab 11/29/20 1858 11/30/20 0642 12/01/20 1006  NA 140  --  139  K 4.4  --  3.4*  CL 103  --  95*  CO2 31  --  35*  GLUCOSE 100*  --  96  BUN 17  --  21*  CREATININE 1.29* 1.31* 1.49*  CALCIUM 9.0  --  8.2*  MG  --   --  2.0   GFR: Estimated Creatinine Clearance: 62.2 mL/min (A) (by C-G formula based on SCr of 1.49 mg/dL (H)). Liver Function Tests: No results for input(s): AST, ALT, ALKPHOS, BILITOT, PROT, ALBUMIN in the last 168 hours. No results for input(s): LIPASE, AMYLASE in the last 168 hours. Recent Labs  Lab 11/30/20 0642  AMMONIA 15   Coagulation Profile: No results for input(s): INR, PROTIME in the last 168 hours. Cardiac Enzymes: No results for input(s): CKTOTAL, CKMB, CKMBINDEX, TROPONINI in the last 168 hours. BNP (last 3 results) No results for input(s): PROBNP in the last 8760 hours. HbA1C: No results for input(s): HGBA1C in the last 72 hours. CBG: Recent Labs  Lab 11/30/20 0221  GLUCAP 94   Lipid Profile: No results for input(s): CHOL, HDL, LDLCALC, TRIG, CHOLHDL, LDLDIRECT in the last 72 hours. Thyroid Function Tests: No results for input(s): TSH, T4TOTAL, FREET4, T3FREE, THYROIDAB in the  last 72 hours. Anemia Panel: No results for input(s): VITAMINB12, FOLATE, FERRITIN, TIBC, IRON, RETICCTPCT in the last 72 hours. Sepsis Labs: No results for input(s): PROCALCITON, LATICACIDVEN in the last 168 hours.  Recent Results (from the past 240 hour(s))  Resp Panel by RT-PCR (Flu A&B, Covid) Nasopharyngeal Swab     Status: None   Collection Time: 11/30/20 12:56 AM   Specimen: Nasopharyngeal Swab; Nasopharyngeal(NP) swabs in vial transport medium  Result Value Ref Range Status   SARS Coronavirus 2 by RT PCR NEGATIVE NEGATIVE Final    Comment: (NOTE) SARS-CoV-2 target nucleic acids are NOT DETECTED.  The SARS-CoV-2 RNA is generally detectable in upper respiratory specimens during the acute phase of infection. The lowest concentration of SARS-CoV-2 viral copies this assay can detect is 138 copies/mL. A negative result does not preclude SARS-Cov-2 infection and should not  be used as the sole basis for treatment or other patient management decisions. A negative result may occur with  improper specimen collection/handling, submission of specimen other than nasopharyngeal swab, presence of viral mutation(s) within the areas targeted by this assay, and inadequate number of viral copies(<138 copies/mL). A negative result must be combined with clinical observations, patient history, and epidemiological information. The expected result is Negative.  Fact Sheet for Patients:  BloggerCourse.com  Fact Sheet for Healthcare Providers:  SeriousBroker.it  This test is no t yet approved or cleared by the [place] FDA and  has been authorized for detection and/or diagnosis of SARS-CoV-2 by FDA under an Emergency Use Authorization (EUA). This EUA will remain  in effect (meaning this test can be used) for the duration of the COVID-19 declaration under Section 564(b)(1) of the Act, 21 U.S.C.section 360bbb-3(b)(1), unless the authorization is  terminated  or revoked sooner.       Influenza A by PCR NEGATIVE NEGATIVE Final   Influenza B by PCR NEGATIVE NEGATIVE Final    Comment: (NOTE) The Xpert Xpress SARS-CoV-2/FLU/RSV plus assay is intended as an aid in the diagnosis of influenza from Nasopharyngeal swab specimens and should not be used as a sole basis for treatment. Nasal washings and aspirates are unacceptable for Xpert Xpress SARS-CoV-2/FLU/RSV testing.  Fact Sheet for Patients: BloggerCourse.com  Fact Sheet for Healthcare Providers: SeriousBroker.it  This test is not yet approved or cleared by the [place] FDA and has been authorized for detection and/or diagnosis of SARS-CoV-2 by FDA under an Emergency Use Authorization (EUA). This EUA will remain in effect (meaning this test can be used) for the duration of the COVID-19 declaration under Section 564(b)(1) of the Act, 21 U.S.C. section 360bbb-3(b)(1), unless the authorization is terminated or revoked.  Performed at [facility], [address] [place], [place] [address]       Radiology Studies: DG Chest 2 View  Result Date: 11/29/2020 CLINICAL DATA:  Chest pain and shortness of breath. EXAM: CHEST - 2 VIEW COMPARISON:  08/17/2019 FINDINGS: The heart is enlarged but appears stable. Stable prominent mediastinal and hilar contours. Vascular congestion without overt pulmonary edema. No definite pleural effusions or pulmonary infiltrates. IMPRESSION: Cardiac enlargement and vascular congestion without overt pulmonary edema. Electronically Signed   By: Rudie Meyer M.D.   On: 11/29/2020 19:32   CT Head Wo Contrast  Result Date: 11/30/2020 CLINICAL DATA:  Altered mental status EXAM: CT HEAD WITHOUT CONTRAST TECHNIQUE: Contiguous axial images were obtained from the base of the skull through the vertex without intravenous contrast. COMPARISON:  CT head dated 09/30/2019 FINDINGS: Brain: No evidence of  acute infarction, hemorrhage, hydrocephalus, extra-axial collection or mass lesion/mass effect. Vascular: No hyperdense vessel or unexpected calcification. Skull: Normal. Negative for fracture or focal lesion. Sinuses/Orbits: The visualized paranasal sinuses are essentially clear. The mastoid air cells are unopacified. Other: None. IMPRESSION: Normal head CT. Electronically Signed   By: Charline Bills M.D.   On: 11/30/2020 03:35   MR BRAIN W WO CONTRAST  Result Date: 12/01/2020 CLINICAL DATA:  Seizure, nontraumatic. EXAM: MRI HEAD WITHOUT AND WITH CONTRAST TECHNIQUE: Multiplanar, multiecho pulse sequences of the brain and surrounding structures were obtained without and with intravenous contrast. CONTRAST:  7mL GADAVIST GADOBUTROL 1 MMOL/ML IV SOLN COMPARISON:  CT head November 30, 2020. FINDINGS: Mildly motion limited exam.  Within this limitation: Brain: No acute infarction, acute hemorrhage, hydrocephalus, extra-axial collection or mass lesion. Mild T2/FLAIR hyperintensities in the periventricular and pontine white matter  punctate focus of susceptibility artifact in the left temporal lobe without edema, most likely the sequela of prior microhemorrhage. The hippocampi are symmetric and size/appearance and within normal limits. No abnormal enhancement. Partially empty sella. Focal T2 hyperintensity along the left greater than right petrous apices (1 cm on the left and 6 mm on the right) with discrete defects in these regions along the petrous apex on prior CT head and CT maxillofacial exams. These areas appear to be in continuity with CSF in Meckel's caves. 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 Vascular: Major arterial flow voids are maintained at the skull base. Skull and upper cervical spine: Diffuse T1 hypointensity of the marrow, nonspecific but potentially related to chronic anemia or chronic hypoxemia (such as in smokers). Sinuses/Orbits: Visualized  sinuses are clear. Other: No mastoid effusions. IMPRESSION: 1. No evidence of acute intracranial abnormality. 2. 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 3. Partially empty sella, which is often a normal anatomic variant but can be associated with idiopathic intracranial hypertension (pseudotumor cerebri). 4. Mild T2/FLAIR hyperintensities in the periventricular and pontine white matter, nonspecific but most likely related to chronic microvascular ischemic disease given the patient's risk factors (including CKD and hypertension). Electronically Signed   By: Feliberto Harts MD   On: 12/01/2020 13:41   ECHOCARDIOGRAM COMPLETE  Result Date: 11/30/2020    ECHOCARDIOGRAM REPORT   Patient Name:   [facility] Date of Exam: 11/30/2020 Medical Rec #:  [id number]          Height:       66.0 in Accession #:    [id number]         Weight:       285.0 lb Date of Birth:  1970/06/10          BSA:          2.325 m Patient Age:    49 years           BP:           189/93 mmHg Patient Gender: F                  HR:           83 bpm. Exam Location:  [facility] Procedure: 2D Echo, Cardiac Doppler and Color Doppler Indications:     CHF-acute diastolic I5.31  History:         Patient has no prior history of Echocardiogram examinations.                  Risk Factors:Hypertension. CKD.  Sonographer:     [name] RDCS (AE) Referring Phys:  [id number] Andris Baumann Diagnosing Phys: Arnoldo Hooker MD  Sonographer Comments: No apical window and Technically challenging study due to limited acoustic windows. IMPRESSIONS  1. Left ventricular ejection fraction, by estimation, is 55 to 60%. The left ventricle has normal function. The left ventricle has no regional wall motion abnormalities. Left ventricular diastolic parameters were normal.  2. Right ventricular systolic function is normal. The right ventricular size is normal.  3. The  mitral valve is normal in structure. Trivial mitral valve regurgitation.  4. The aortic valve is normal in structure. Aortic valve regurgitation is not visualized. FINDINGS  Left Ventricle: Left ventricular ejection fraction, by estimation, is 55 to 60%. The left ventricle has normal function. The left ventricle has  no regional wall motion abnormalities. The left ventricular internal cavity size was normal in size. There is  no left ventricular hypertrophy. Left ventricular diastolic parameters were normal. Right Ventricle: The right ventricular size is normal. No increase in right ventricular wall thickness. Right ventricular systolic function is normal. Left Atrium: Left atrial size was normal in size. Right Atrium: Right atrial size was normal in size. Pericardium: There is no evidence of pericardial effusion. Mitral Valve: The mitral valve is normal in structure. Trivial mitral valve regurgitation. Tricuspid Valve: The tricuspid valve is normal in structure. Tricuspid valve regurgitation is trivial. Aortic Valve: The aortic valve is normal in structure. Aortic valve regurgitation is not visualized. Pulmonic Valve: The pulmonic valve was normal in structure. Pulmonic valve regurgitation is not visualized. Aorta: The aortic root and ascending aorta are structurally normal, with no evidence of dilitation. IAS/Shunts: No atrial level shunt detected by color flow Doppler.  LEFT VENTRICLE PLAX 2D LVIDd:         5.55 cm LVIDs:         3.43 cm LV PW:         1.56 cm LV IVS:        1.06 cm LVOT diam:     2.20 cm LVOT Area:     3.80 cm  LEFT ATRIUM         Index LA diam:    4.60 cm 1.98 cm/m                        PULMONIC VALVE AORTA                 PV Vmax:        0.83 m/s Ao Root diam: 3.00 cm PV Peak grad:   2.8 mmHg                       RVOT Peak grad: 4 mmHg   SHUNTS Systemic Diam: 2.20 cm Arnoldo HookerBruce Kowalski MD Electronically signed by Arnoldo HookerBruce Kowalski MD Signature Date/Time: 11/30/2020/1:14:25 PM    Final       Scheduled Meds:  aspirin EC  81 mg Oral Daily   atorvastatin  40 mg Oral q1800   carvedilol  25 mg Oral BID WC   enoxaparin (LOVENOX) injection  0.5 mg/kg Subcutaneous Q24H   isosorbide mononitrate  30 mg Oral Daily   levETIRAcetam  500 mg Oral BID   mometasone-formoterol  2 puff Inhalation BID   OXcarbazepine  600 mg Oral BID   pantoprazole  40 mg Oral Daily   Continuous Infusions:   LOS: 1 day     Darlin Priestlyina Merrin Mcvicker, MD [facility] If 7PM-7AM, please contact night-coverage 12/01/2020, 6:23 PM

## 2020-12-01 NOTE — Progress Notes (Signed)
This RN made Dr. Darlin Priestly aware of the patient's report of 9/10 chest pain pressure, 9/10 headache and general malaise. EKG ordered. Bendaryl and Compazine ordered to address headache. Pt reported feeling better.

## 2020-12-01 NOTE — Progress Notes (Signed)
Neurology Progress Note  Patient ID: 50 yo with systolic HF and poorly-controlled epilepsy admitted for hypertensive emergency and fluid overload and being diuresed. She had staring spell in ED typical of her seizures and has breakthrough seizures at least once per month.   Subjective: - Patient restarted on OXC - Had an episode of CP this AM; being worked up by primary team - Given benadryl and compazine for headache this morning. Headache improved, slightly lethargic, no new neurologic sx - No concern for further breakthrough seizures since admission - No e/o PRES on brain MRI  Interval data:  MRI brain wwo  1. No evidence of acute intracranial abnormality. 2. 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 3. Partially empty sella, which is often a normal anatomic variant but can be associated with idiopathic intracranial hypertension (pseudotumor cerebri). 4. Mild T2/FLAIR hyperintensities in the periventricular and pontine white matter, nonspecific but most likely related to chronic microvascular ischemic disease given the patient's risk factors (including CKD and hypertension).  CNS imaging personally reviewed; I agree with above interpretation  Exam: Vitals:   12/01/20 0736 12/01/20 1509  BP: (!) 110/44 (!) 155/74  Pulse: 82 72  Resp: 17 17  Temp: 98.9 F (37.2 C) 98.1 F (36.7 C)  SpO2: 100% 99%   Physical Exam Gen: A&O x4, NAD HEENT: Atraumatic, normocephalic;mucous membranes moist; oropharynx clear, tongue without atrophy or fasciculations. Neck: Supple, trachea midline. Resp: CTAB, no w/r/r CV: RRR, no m/g/r; nml S1 and S2. 2+ symmetric peripheral pulses. Abd: soft/NT/ND; nabs x 4 quad Extrem: Nml bulk; no cyanosis, clubbing, or edema.   Neuro: *MS: A&O x4. Follows multi-step commands. *Speech: fluid, nondysarthric, able to name and repeat *CN:   I:  Deferred   II,III: PERRLA, VFF by confrontation, optic discs sharp   III,IV,VI: EOMI w/o nystagmus, no ptosis   V: Sensation intact from V1 to V3 to LT   VII: Eyelid closure was full.  Smile symmetric.   VIII: Hearing intact to voice   IX,X: Voice normal, palate elevates symmetrically   XI: SCM/trap 5/5 bilat   XII: Tongue protrudes midline, no atrophy or fasciculations   *Motor:   Normal bulk.  No tremor, rigidity or bradykinesia. No pronator drift.     Strength: Dlt Bic Tri WrE WrF FgS Gr HF KnF KnE PlF DoF   Left 5 5 5 5 5 5 5 5 5 5 5 5    Right 5 5 5 5 5 5 5 5 5 5 5 5      *Sensory: Intact to light touch, pinprick, temperature vibration throughout. Symmetric. Propioception intact bilat.  No double-simultaneous extinction. *Coordination:  Finger-to-nose, heel-to-shin, rapid alternating motions were intact. *Reflexes:  2+ and symmetric throughout without clonus; toes down-going bilat *Gait: deferred  Impression: Neurology consulted for breakthrough seizure in this patient with poorly controlled epilepsy admitted for dyspnea and increasing BLE edema. She has breakthrough seizures approximately 1x per month on OXC 600mg  bid and has never been better controlled than this. I restarted her home oxcarbazepine and d/c'd keppra. Brain MRI showed no e/o PRES. EEG not available over the weekend which is ok bc would not change mgmt.  Recommendations: - No further inpatient neurologic workup indicated at this time  Neurology will not continue to actively follow. Please re-engage if additional neurologic concerns arise.    , MD [facility] [phone]  If 7pm- 7am, please page  neurology on call as listed in [place].

## 2020-12-01 NOTE — Progress Notes (Signed)
Chaplain Maggie responded to Rapid Response. After staff cleared the room, Chaplain offered encouragement and prayer at bedside. Patient appreciated the visit and is open to continued spiritual care.

## 2020-12-02 LAB — BASIC METABOLIC PANEL
Anion gap: 5 (ref 5–15)
BUN: 23 mg/dL — ABNORMAL HIGH (ref 6–20)
CO2: 38 mmol/L — ABNORMAL HIGH (ref 22–32)
Calcium: 8.3 mg/dL — ABNORMAL LOW (ref 8.9–10.3)
Chloride: 94 mmol/L — ABNORMAL LOW (ref 98–111)
Creatinine, Ser: 1.61 mg/dL — ABNORMAL HIGH (ref 0.44–1.00)
GFR, Estimated: 39 mL/min — ABNORMAL LOW (ref >60)
Glucose, Bld: 83 mg/dL (ref 70–99)
Potassium: 3.4 mmol/L — ABNORMAL LOW (ref 3.5–5.1)
Sodium: 137 mmol/L (ref 135–145)

## 2020-12-02 LAB — CBC
HCT: 41.4 % (ref 36.0–46.0)
Hemoglobin: 12.7 g/dL (ref 12.0–15.0)
MCH: 25.7 pg — ABNORMAL LOW (ref 26.0–34.0)
MCHC: 30.7 g/dL (ref 30.0–36.0)
MCV: 83.8 fL (ref 80.0–100.0)
Platelets: 246 10*3/uL (ref 150–400)
RBC: 4.94 MIL/uL (ref 3.87–5.11)
RDW: 17.5 % — ABNORMAL HIGH (ref 11.5–15.5)
WBC: 5 10*3/uL (ref 4.0–10.5)
nRBC: 0 % (ref 0.0–0.2)

## 2020-12-02 LAB — MAGNESIUM: Magnesium: 1.9 mg/dL (ref 1.7–2.4)

## 2020-12-02 MED ORDER — POTASSIUM CHLORIDE CRYS ER 20 MEQ PO TBCR
40.0000 meq | EXTENDED_RELEASE_TABLET | Freq: Once | ORAL | Status: AC
  Administered 2020-12-02: 40 meq via ORAL
  Filled 2020-12-02: qty 2

## 2020-12-02 MED ORDER — BUTALBITAL-APAP-CAFFEINE 50-325-40 MG PO TABS
1.0000 | ORAL_TABLET | Freq: Four times a day (QID) | ORAL | Status: DC | PRN
  Administered 2020-12-03: 1 via ORAL
  Filled 2020-12-02: qty 1

## 2020-12-02 NOTE — Plan of Care (Signed)
  Problem: Education: Goal: Knowledge of General Education information will improve Description: Including pain rating scale, medication(s)/side effects and non-pharmacologic comfort measures 12/02/2020 1231 by Ansel Bong, RN Outcome: Progressing 12/02/2020 1231 by Ansel Bong, RN Outcome: Progressing   Problem: Health Behavior/Discharge Planning: Goal: Ability to manage health-related needs will improve 12/02/2020 1231 by Ansel Bong, RN Outcome: Progressing 12/02/2020 1231 by Ansel Bong, RN Outcome: Progressing   Problem: Clinical Measurements: Goal: Ability to maintain clinical measurements within normal limits will improve 12/02/2020 1231 by Ansel Bong, RN Outcome: Progressing 12/02/2020 1231 by Ansel Bong, RN Outcome: Progressing Goal: Will remain free from infection 12/02/2020 1231 by Ansel Bong, RN Outcome: Progressing 12/02/2020 1231 by Ansel Bong, RN Outcome: Progressing Goal: Diagnostic test results will improve 12/02/2020 1231 by Ansel Bong, RN Outcome: Progressing 12/02/2020 1231 by Ansel Bong, RN Outcome: Progressing Goal: Respiratory complications will improve 12/02/2020 1231 by Ansel Bong, RN Outcome: Progressing 12/02/2020 1231 by Ansel Bong, RN Outcome: Progressing Goal: Cardiovascular complication will be avoided 12/02/2020 1231 by Ansel Bong, RN Outcome: Progressing 12/02/2020 1231 by Ansel Bong, RN Outcome: Progressing   Problem: Activity: Goal: Risk for activity intolerance will decrease 12/02/2020 1231 by Ansel Bong, RN Outcome: Progressing 12/02/2020 1231 by Ansel Bong, RN Outcome: Progressing   Problem: Nutrition: Goal: Adequate nutrition will be maintained 12/02/2020 1231 by Ansel Bong, RN Outcome: Progressing 12/02/2020 1231 by Ansel Bong, RN Outcome: Progressing   Problem: Coping: Goal: Level of anxiety will decrease 12/02/2020 1231 by Ansel Bong, RN Outcome:  Progressing 12/02/2020 1231 by Ansel Bong, RN Outcome: Progressing   Problem: Elimination: Goal: Will not experience complications related to bowel motility 12/02/2020 1231 by Ansel Bong, RN Outcome: Progressing 12/02/2020 1231 by Ansel Bong, RN Outcome: Progressing Goal: Will not experience complications related to urinary retention 12/02/2020 1231 by Ansel Bong, RN Outcome: Progressing 12/02/2020 1231 by Ansel Bong, RN Outcome: Progressing   Problem: Pain Managment: Goal: General experience of comfort will improve 12/02/2020 1231 by Ansel Bong, RN Outcome: Progressing 12/02/2020 1231 by Ansel Bong, RN Outcome: Progressing   Problem: Safety: Goal: Ability to remain free from injury will improve 12/02/2020 1231 by Ansel Bong, RN Outcome: Progressing 12/02/2020 1231 by Ansel Bong, RN Outcome: Progressing   Problem: Skin Integrity: Goal: Risk for impaired skin integrity will decrease 12/02/2020 1231 by Ansel Bong, RN Outcome: Progressing 12/02/2020 1231 by Ansel Bong, RN Outcome: Progressing   Problem: Education: Goal: Ability to demonstrate management of disease process will improve 12/02/2020 1231 by Ansel Bong, RN Outcome: Progressing 12/02/2020 1231 by Ansel Bong, RN Outcome: Progressing Goal: Ability to verbalize understanding of medication therapies will improve 12/02/2020 1231 by Ansel Bong, RN Outcome: Progressing 12/02/2020 1231 by Ansel Bong, RN Outcome: Progressing Goal: Individualized Educational Video(s) 12/02/2020 1231 by Ansel Bong, RN Outcome: Progressing 12/02/2020 1231 by Ansel Bong, RN Outcome: Progressing   Problem: Activity: Goal: Capacity to carry out activities will improve 12/02/2020 1231 by Ansel Bong, RN Outcome: Progressing 12/02/2020 1231 by Ansel Bong, RN Outcome: Progressing   Problem: Cardiac: Goal: Ability to achieve and maintain adequate cardiopulmonary perfusion will  improve 12/02/2020 1231 by Ansel Bong, RN Outcome: Progressing 12/02/2020 1231 by Ansel Bong, RN Outcome: Progressing

## 2020-12-02 NOTE — Progress Notes (Signed)
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 female with medical history significant for HTN, CKD 3A, obesity class IIIa, HFpEF with improved EF from 20-25% 2016 to 55-60% 2019, complex partial seizures, who presents to the emergency room for evaluation of shortness of breath that has been developing over the past week associated with increasing lower extremity edema not improving with her home dose of Lasix 60 mg.  Patient was treated with IV Lasix, IV hydralazine, Nitropaste and hospitalist consulted for admission.  IV Vasotec also administered   While awaiting admission, patient became confused, appeared to be staring blankly and not answering questions.  Her exam was nonfocal.  Her level of alertness appeared to be fluctuating.  Stat head CT showed no acute findings. Additional labs were sent including urine drug screen, ammonia level Chart review reveals that patient had history of complex partial seizures during her hospitalization in June 2021 with impaired consciousness.  She was loaded with Keppra.  Neuro consulted.     Hypertensive emergency -Patient with systolic BP up to 220 while in the ED - s/p hydralazine and Nitropaste and Lasix as well as IV Vasotec --BP has since been intermittently low Plan: --cont home coreg and Imdur --hold home hydralazine   Vascular congestion and swelling Hx of combined CHF with improved EF - CXR with cardiomegaly and pulmonary vascular congestion.  BNP 1117.  Pt appeared fluid  overloaded. --current Echo showed normal systolic and diastolic function. --s/p IV lasix 60 mg BID Plan: --hold diuresis today due to slight Cr bump --Strict I/O    Acute metabolic encephalopathy, resolved - Patient developed altered mental status while awaiting admission - Etiology uncertain - Head CT negative - Was loaded with Keppra for possible postictal state.  Exam was nonfocal - MRI head, UDS and ammonia level unremarkable. --neuro consulted Plan: --monitor     Chronic kidney disease, stage 3a - Creatinine 1.29 at baseline --monitor Cr while diuresing     Obesity, Class III, BMI 40-49.9 (morbid obesity) (HCC) - Complicating factor to overall prognosis and care     Complex partial seizure disorder (HCC) - On Trileptal 600 twice daily and Keppra 500 twice daily but pt only takes Trileptal --loaded with IV keppra on presentation - MRI brain wwo no acute finding Plan: --cont home Trileptal 600 mg BID - Continue keppra 500mg  bid through tomorrow as bridge - EEG not available over the weekend and not necessary, per Neuro  Headache Concern for OSA --morning headache, waking up middle of the night gasping for air, body habitus, family noting pt has breathing pauses, concerning for OSA --Fioricet PRN for headache --outpatient sleep study    DVT prophylaxis: Lovenox SQ Code Status: Full code  Family Communication:  Level of care: Progressive Cardiac Dispo:   The patient is from: home Anticipated d/c is to: home Anticipated d/c date is: 1-2 days Patient currently is not medically ready to d/c due to: need more volume removed, and need close Cr monitoring due to CKD   Subjective and Interval History:  Pt had headache again after waking, improved with Fioricet.  Swelling improved.  Chest pain resolved.  Good urine output.  Feeling better.    Objective: Vitals:   12/02/20 0343 12/02/20 0734 12/02/20 0800 12/02/20 1101  BP: (!) 94/51 (!) 133/50  129/67  Pulse: 78 87   75  Resp: 17 17  17   Temp: 98.6 F (37 C) 99 F (37.2 C)  98.8 F (37.1 C)  TempSrc:  Oral    SpO2: 91% 97%  99%  Weight:   131.3 kg   Height:        Intake/Output Summary (Last 24 hours) at 12/02/2020 1425 Last data filed at 12/02/2020 1051 Gross per 24 hour  Intake 600 ml  Output --  Net 600 ml   Filed Weights   12/01/20 0533 12/02/20 0120 12/02/20 0800  Weight: 131.5 kg (!) 289.3 kg 131.3 kg    Examination:   Constitutional: NAD, AAOx3, sitting in recliner HEENT: conjunctivae and lids normal, EOMI CV: No cyanosis.   RESP: normal respiratory effort, on RA SKIN: warm, dry Neuro: II - XII grossly intact.   Psych: Normal mood and affect.  Appropriate judgement and reason   Data Reviewed: I have personally reviewed following labs and imaging studies  CBC: Recent Labs  Lab 11/29/20 1858 11/30/20 0642 12/01/20 1006 12/02/20 0649  WBC 4.6 6.2 5.0 5.0  HGB 11.6* 12.2 11.6* 12.7  HCT 37.4 40.2 38.3 41.4  MCV 84.0 83.4 84.4 83.8  PLT 215 228 237 246   Basic Metabolic Panel: Recent Labs  Lab 11/29/20 1858 11/30/20 0642 12/01/20 1006 12/02/20 0649  NA 140  --  139 137  K 4.4  --  3.4* 3.4*  CL 103  --  95* 94*  CO2 31  --  35* 38*  GLUCOSE 100*  --  96 83  BUN 17  --  21* 23*  CREATININE 1.29* 1.31* 1.49* 1.61*  CALCIUM 9.0  --  8.2* 8.3*  MG  --   --  2.0 1.9   GFR: Estimated Creatinine Clearance: 57.5 mL/min (A) (by C-G formula based on SCr of 1.61 mg/dL (H)). Liver Function Tests: No results for input(s): AST, ALT, ALKPHOS, BILITOT, PROT, ALBUMIN in the last 168 hours. No results for input(s): LIPASE, AMYLASE in the last 168 hours. Recent Labs  Lab 11/30/20 0642  AMMONIA 15   Coagulation Profile: No results for input(s): INR, PROTIME in the last 168 hours. Cardiac Enzymes: No results for input(s): CKTOTAL, CKMB, CKMBINDEX, TROPONINI in the last 168 hours. BNP (last 3 results) No results for input(s): PROBNP in the last 8760 hours. HbA1C: No  results for input(s): HGBA1C in the last 72 hours. CBG: Recent Labs  Lab 11/30/20 0221  GLUCAP 94   Lipid Profile: No results for input(s): CHOL, HDL, LDLCALC, TRIG, CHOLHDL, LDLDIRECT in the last 72 hours. Thyroid Function Tests: No results for input(s): TSH, T4TOTAL, FREET4, T3FREE, THYROIDAB in the last 72 hours. Anemia Panel: No results for input(s): VITAMINB12, FOLATE, FERRITIN, TIBC, IRON, RETICCTPCT in the last 72 hours. Sepsis Labs: No results for input(s): PROCALCITON, LATICACIDVEN in the last 168 hours.  Recent Results (from the past 240 hour(s))  Resp Panel by RT-PCR (Flu A&B, Covid) Nasopharyngeal Swab     Status: None   Collection Time: 11/30/20 12:56 AM   Specimen: Nasopharyngeal Swab; Nasopharyngeal(NP) swabs in vial transport medium  Result Value Ref Range Status   SARS Coronavirus 2 by RT PCR NEGATIVE NEGATIVE Final    Comment: (  NOTE) SARS-CoV-2 target nucleic acids are NOT DETECTED.  The SARS-CoV-2 RNA is generally detectable in upper respiratory specimens during the acute phase of infection. The lowest concentration of SARS-CoV-2 viral copies this assay can detect is 138 copies/mL. A negative result does not preclude SARS-Cov-2 infection and should not be used as the sole basis for treatment or other patient management decisions. A negative result may occur with  improper specimen collection/handling, submission of specimen other than nasopharyngeal swab, presence of viral mutation(s) within the areas targeted by this assay, and inadequate number of viral copies(<138 copies/mL). A negative result must be combined with clinical observations, patient history, and epidemiological information. The expected result is Negative.  Fact Sheet for Patients:  BloggerCourse.comhttps://www.fda.gov/media/152166/download  Fact Sheet for Healthcare Providers:  SeriousBroker.ithttps://www.fda.gov/media/152162/download  This test is no t yet approved or cleared by the [place] FDA and  has been  authorized for detection and/or diagnosis of SARS-CoV-2 by FDA under an Emergency Use Authorization (EUA). This EUA will remain  in effect (meaning this test can be used) for the duration of the COVID-19 declaration under Section 564(b)(1) of the Act, 21 U.S.C.section 360bbb-3(b)(1), unless the authorization is terminated  or revoked sooner.       Influenza A by PCR NEGATIVE NEGATIVE Final   Influenza B by PCR NEGATIVE NEGATIVE Final    Comment: (NOTE) The Xpert Xpress SARS-CoV-2/FLU/RSV plus assay is intended as an aid in the diagnosis of influenza from Nasopharyngeal swab specimens and should not be used as a sole basis for treatment. Nasal washings and aspirates are unacceptable for Xpert Xpress SARS-CoV-2/FLU/RSV testing.  Fact Sheet for Patients: BloggerCourse.comhttps://www.fda.gov/media/152166/download  Fact Sheet for Healthcare Providers: SeriousBroker.ithttps://www.fda.gov/media/152162/download  This test is not yet approved or cleared by the [place] FDA and has been authorized for detection and/or diagnosis of SARS-CoV-2 by FDA under an Emergency Use Authorization (EUA). This EUA will remain in effect (meaning this test can be used) for the duration of the COVID-19 declaration under Section 564(b)(1) of the Act, 21 U.S.C. section 360bbb-3(b)(1), unless the authorization is terminated or revoked.  Performed at [facility], [address] [place], [place] [id number]       Radiology Studies: MR BRAIN W WO CONTRAST  Result Date: 12/01/2020 CLINICAL DATA:  Seizure, nontraumatic. EXAM: MRI HEAD WITHOUT AND WITH CONTRAST TECHNIQUE: Multiplanar, multiecho pulse sequences of the brain and surrounding structures were obtained without and with intravenous contrast. CONTRAST:  10mL GADAVIST GADOBUTROL 1 MMOL/ML IV SOLN COMPARISON:  CT head November 30, 2020. FINDINGS: Mildly motion limited exam.  Within this limitation: Brain: No acute infarction, acute hemorrhage, hydrocephalus, extra-axial  collection or mass lesion. Mild T2/FLAIR hyperintensities in the periventricular and pontine white matter punctate focus of susceptibility artifact in the left temporal lobe without edema, most likely the sequela of prior microhemorrhage. The hippocampi are symmetric and size/appearance and within normal limits. No abnormal enhancement. Partially empty sella. Focal T2 hyperintensity along the left greater than right petrous apices (1 cm on the left and 6 mm on the right) with discrete defects in these regions along the petrous apex on prior CT head and CT maxillofacial exams. These areas appear to be in continuity with CSF in Meckel's caves. 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 Vascular: Major arterial flow voids are maintained at the skull base. Skull and upper cervical spine: Diffuse T1 hypointensity of the marrow, nonspecific but potentially related to chronic anemia or chronic hypoxemia (such as in smokers). Sinuses/Orbits: Visualized  sinuses are clear. Other: No mastoid effusions. IMPRESSION: 1. No evidence of acute intracranial abnormality. 2. 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 3. Partially empty sella, which is often a normal anatomic variant but can be associated with idiopathic intracranial hypertension (pseudotumor cerebri). 4. Mild T2/FLAIR hyperintensities in the periventricular and pontine white matter, nonspecific but most likely related to chronic microvascular ischemic disease given the patient's risk factors (including CKD and hypertension). Electronically Signed   By: Feliberto Harts MD   On: 12/01/2020 13:41     Scheduled Meds:  aspirin EC  81 mg Oral Daily   atorvastatin  40 mg Oral q1800   carvedilol  25 mg Oral BID WC   enoxaparin (LOVENOX) injection  0.5 mg/kg Subcutaneous Q24H   isosorbide mononitrate  30 mg Oral  Daily   levETIRAcetam  500 mg Oral BID   mometasone-formoterol  2 puff Inhalation BID   OXcarbazepine  600 mg Oral BID   pantoprazole  40 mg Oral Daily   potassium chloride  40 mEq Oral Once   Continuous Infusions:   LOS: 2 days     Darlin Priestly, MD [facility] If 7PM-7AM, please contact night-coverage 12/02/2020, 2:25 PM

## 2020-12-03 LAB — CBC
HCT: 41.9 % (ref 36.0–46.0)
Hemoglobin: 12.9 g/dL (ref 12.0–15.0)
MCH: 25.4 pg — ABNORMAL LOW (ref 26.0–34.0)
MCHC: 30.8 g/dL (ref 30.0–36.0)
MCV: 82.6 fL (ref 80.0–100.0)
Platelets: 250 10*3/uL (ref 150–400)
RBC: 5.07 MIL/uL (ref 3.87–5.11)
RDW: 17.4 % — ABNORMAL HIGH (ref 11.5–15.5)
WBC: 4.4 10*3/uL (ref 4.0–10.5)
nRBC: 0 % (ref 0.0–0.2)

## 2020-12-03 LAB — BASIC METABOLIC PANEL
Anion gap: 6 (ref 5–15)
BUN: 24 mg/dL — ABNORMAL HIGH (ref 6–20)
CO2: 35 mmol/L — ABNORMAL HIGH (ref 22–32)
Calcium: 8.6 mg/dL — ABNORMAL LOW (ref 8.9–10.3)
Chloride: 95 mmol/L — ABNORMAL LOW (ref 98–111)
Creatinine, Ser: 1.48 mg/dL — ABNORMAL HIGH (ref 0.44–1.00)
GFR, Estimated: 43 mL/min — ABNORMAL LOW (ref >60)
Glucose, Bld: 102 mg/dL — ABNORMAL HIGH (ref 70–99)
Potassium: 4 mmol/L (ref 3.5–5.1)
Sodium: 136 mmol/L (ref 135–145)

## 2020-12-03 LAB — MAGNESIUM: Magnesium: 2 mg/dL (ref 1.7–2.4)

## 2020-12-03 MED ORDER — BUTALBITAL-APAP-CAFFEINE 50-325-40 MG PO TABS: 1.0000 | ORAL_TABLET | Freq: Two times a day (BID) | ORAL | 0 refills | Status: DC | PRN

## 2020-12-03 MED ORDER — FUROSEMIDE 10 MG/ML IJ SOLN
60.0000 mg | Freq: Once | INTRAMUSCULAR | Status: AC
  Administered 2020-12-03: 60 mg via INTRAVENOUS
  Filled 2020-12-03: qty 6

## 2020-12-03 MED ORDER — HYDRALAZINE HCL 100 MG PO TABS
ORAL_TABLET | ORAL | Status: DC
Start: 2020-12-03 — End: 2021-12-02

## 2020-12-03 MED ORDER — NITROGLYCERIN 0.4 MG SL SUBL: 0.4000 mg | SUBLINGUAL_TABLET | SUBLINGUAL | 2 refills | Status: DC | PRN

## 2020-12-03 MED ORDER — LEVETIRACETAM 500 MG PO TABS: 500.0000 mg | ORAL_TABLET | Freq: Two times a day (BID) | ORAL | 2 refills | Status: DC

## 2020-12-03 NOTE — Discharge Summary (Signed)
Physician Discharge Summary   Sheila ArcherRhonda Renee Hays  female DOB: Jul 21, 1970  OZH:[id number]  PCP: [facility]  Admit date: 11/29/2020 Discharge date: 12/03/2020  Admitted From: home Disposition:  home CODE STATUS: Full code  Discharge Instructions     AMB referral to CHF clinic   Complete by: As directed    Discharge instructions   Complete by: As directed    You have received IV Lasix while in the hospital with good urine output.  Please continue your home Lasix 60 mg as twice a day for 3 days, and then go back to once a day.  You have followup with PCP who can adjust the diuretic dose further.  You have been getting Keppra during your hospitalization.  Please continue to take it together with your Trileptal to hopefully reduce your seizure frequency.  You have signs of sleep apnea.  Please have your PCP arrange for sleep study.   Dr. Darlin Priestlyina Carvin Almas [facility] H[facility]- -        Hospital Course:  For full details, please see H&P, progress notes, consult notes and ancillary notes.  Briefly,  Sheila ArcherRhonda Renee Hays is a 50 y.o. female with medical history significant for HTN, CKD 3A, obesity class IIIa, HFpEF with improved EF from 20-25% 2016 to 55-60% 2019, complex partial seizures, who presented to the emergency room for evaluation of shortness of breath that has been developing over the past week associated with increasing lower extremity edema not improving with her home dose of Lasix 60 mg.   Patient was treated with IV Lasix, IV hydralazine, Nitropaste and hospitalist consulted for admission.  IV Vasotec also administered.   While awaiting admission, patient became confused, appeared to be staring blankly and not answering questions.  Her exam was nonfocal.  Her level of alertness appeared to be fluctuating.  Stat head CT showed no acute findings.  Chart review reveals that patient had history of complex partial seizures during her hospitalization in June 2021 with impaired  consciousness.  She was loaded with Keppra.  Neuro consulted.     Hypertensive emergency -Patient with systolic BP up to 220 while in the ED - s/p hydralazine and Nitropaste and Lasix as well as IV Vasotec --BP had since been intermittently low --cont home coreg, Imdur and losartan --after IV diuresis, pt was discharged on home Lasix 60 mg as twice a day for 3 days, and then back to daily. --home hydralazine held pending outpatient followup.   Vascular congestion and swelling Hx of combined CHF with improved EF - CXR with cardiomegaly and pulmonary vascular congestion.  BNP 1117.  Pt appeared fluid overloaded. --current Echo showed normal systolic and diastolic function. --Pt received IV lasix 60 mg BID with good urine output and improvement in symptoms, however, did have to hold diuresis on 7/10 due to slight Cr bump. --pt was discharged on home Lasix 60 mg as twice a day for 3 days, and then back to daily.     Acute metabolic encephalopathy, resolved - Patient developed altered mental status while awaiting admission - Etiology uncertain - Head CT negative - Was loaded with Keppra for possible postictal state.  Exam was nonfocal - MRI head, UDS and ammonia level unremarkable. --neuro consulted    Chronic kidney disease, stage 3a - Creatinine at baseline    Obesity, Class III, BMI 40-49.9 (morbid obesity) (HCC) - Complicating factor to overall prognosis and care     Complex partial seizure disorder (HCC) - On Trileptal 600 twice  daily and Keppra 500 twice daily but pt only takes Trileptal.  Pt and sister reported breakthrough seizure monthly. --Pt was loaded with IV keppra on presentation due to concern for seizure, and continued on oral keppra. - MRI brain wwo no acute finding --cont home Trileptal 600 mg BID --Since pt tolerated Keppra during hospitalization, pt was encouraged to continue taking it after discharge.   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 The score is based on dignosis, age, lab data, medications, orders, and past utilization.   Low:  0-14.9   Medium: 15-21.9   High: 22-29.9   Extreme: 30 and above          Discharge Instructions:  Allergies as of 12/03/2020       Reactions   Ciprofloxacin Itching        Medication List     STOP taking these medications    indomethacin 25 MG capsule Commonly known as: INDOCIN       TAKE these medications    albuterol 108 (90 Base) MCG/ACT inhaler Commonly known as: VENTOLIN HFA Inhale 2 puffs into the lungs every 4 (four) hours as needed.   aspirin EC 81 MG tablet Take 81 mg by mouth daily.   atorvastatin 40 MG tablet Commonly known as: LIPITOR Take 1 tablet (40 mg total) by mouth daily at 6 PM.   butalbital-acetaminophen-caffeine 50-325-40 MG tablet Commonly known as: FIORICET Take 1 tablet by mouth 2 (two) times daily as needed for headache.   carvedilol 25 MG tablet Commonly known as: COREG Take 1 tablet (25 mg total) by mouth 2 (two) times daily with a meal.   folic acid 400 MCG tablet Commonly known as: FOLVITE Take 400 mcg by mouth  daily.   furosemide 20 MG tablet Commonly known as: LASIX Take 60 mg by mouth daily.   hydrALAZINE 100 MG tablet Commonly known as: APRESOLINE Hold until followup with your outpatient doctor because you blood pressure was normal without this. What changed:  how much to take how to take this when to take this additional instructions   isosorbide mononitrate 30 MG 24 hr tablet Commonly known as: IMDUR Take 30 mg by mouth daily.   levETIRAcetam 500 MG tablet Commonly known as: KEPPRA Take 1 tablet (500 mg total) by mouth 2 (two) times daily.   losartan 50 MG tablet Commonly known as: COZAAR Take 50 mg by mouth daily.   nitroGLYCERIN 0.4 MG SL tablet Commonly known as: Nitrostat Place 1 tablet (0.4 mg total) under the tongue every 5 (five) minutes as needed.   omeprazole 20 MG capsule Commonly known as: PRILOSEC Take 20 mg by mouth  daily.   oxcarbazepine 600 MG tablet Commonly known as: TRILEPTAL Take 600 mg by mouth 2 (two) times daily.   Potassium Chloride ER 20 MEQ Tbcr Take 1 tablet by mouth daily.   Symbicort 160-4.5 MCG/ACT inhaler Generic drug: budesonide-formoterol Inhale 2 puffs into the lungs in the morning and at bedtime.   traZODone 100 MG tablet Commonly known as: DESYREL Take 100 mg by mouth at bedtime as needed for sleep.   vitamin C 500 MG tablet Commonly known as: ASCORBIC ACID Take 500 mg by mouth daily.         Follow-up Information     Gollan, Tollie Pizza, MD. Schedule an appointment as soon as possible for a visit .   Specialty: Cardiology Contact information: [address] [place] [address] [phone]         [facility] Schedule an appointment as soon as possible for a visit .   Contact information: [address] [place] [address] [phone]         Your outpatient neurologist Follow up.                  Allergies  Allergen Reactions   Ciprofloxacin Itching      The results of significant diagnostics from this hospitalization (including imaging, microbiology, ancillary and laboratory) are listed below for reference.   Consultations:   Procedures/Studies: DG Chest 2 View  Result Date: 11/29/2020 CLINICAL DATA:  Chest pain and shortness of breath. EXAM: CHEST - 2 VIEW COMPARISON:  08/17/2019 FINDINGS: The heart is enlarged but appears stable. Stable prominent mediastinal and hilar contours. Vascular congestion without overt pulmonary edema. No definite pleural effusions or pulmonary infiltrates. IMPRESSION: Cardiac enlargement and vascular congestion without overt pulmonary edema. Electronically Signed   By: Rudie Meyer M.D.   On: 11/29/2020 19:32   CT Head Wo Contrast  Result Date: 11/30/2020 CLINICAL DATA:  Altered mental status EXAM: CT HEAD WITHOUT CONTRAST TECHNIQUE: Contiguous axial images were obtained from the base of the skull through the vertex without intravenous contrast. COMPARISON:  CT head dated 09/30/2019 FINDINGS: Brain: No evidence of acute infarction, hemorrhage, hydrocephalus, extra-axial collection or mass lesion/mass effect. Vascular: No hyperdense vessel or unexpected calcification. Skull: Normal. Negative for fracture or focal lesion. Sinuses/Orbits: The visualized paranasal sinuses are essentially clear. The mastoid air cells are unopacified. Other: None. IMPRESSION: Normal head CT. Electronically Signed   By: Charline Bills M.D.   On: 11/30/2020 03:35   MR BRAIN W WO CONTRAST  Result Date: 12/01/2020 CLINICAL DATA:  Seizure, nontraumatic. EXAM: MRI HEAD WITHOUT AND WITH CONTRAST TECHNIQUE: Multiplanar, multiecho pulse sequences of the brain and surrounding structures were obtained without and with intravenous contrast. CONTRAST:  70mL GADAVIST GADOBUTROL 1 MMOL/ML IV SOLN COMPARISON:  CT head November 30, 2020. FINDINGS: Mildly motion limited exam.  Within this limitation: Brain: No acute infarction, acute hemorrhage,  hydrocephalus, extra-axial collection or mass lesion. Mild T2/FLAIR hyperintensities in the periventricular and pontine white matter punctate focus of susceptibility artifact in the left temporal lobe without edema, most likely the sequela of prior microhemorrhage. The hippocampi are symmetric and size/appearance and within normal limits. No abnormal enhancement. Partially empty sella. Focal T2 hyperintensity along the left greater than right petrous apices (1 cm on the left and 6 mm on the right) with discrete defects in these regions along the petrous apex on prior CT head and CT maxillofacial exams. These areas appear to be in continuity with CSF in  Meckel's caves. 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 Vascular: Major arterial flow voids are maintained at the skull base. Skull and upper cervical spine: Diffuse T1 hypointensity of the marrow, nonspecific but potentially related to chronic anemia or chronic hypoxemia (such as in smokers). Sinuses/Orbits: Visualized sinuses are clear. Other: No mastoid effusions. IMPRESSION: 1. No evidence of acute intracranial abnormality. 2. 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 3. Partially empty sella, which is often a normal anatomic variant but can be associated with idiopathic intracranial hypertension (pseudotumor cerebri). 4. Mild T2/FLAIR hyperintensities in the periventricular and pontine white matter, nonspecific but most likely related to chronic microvascular ischemic disease given the patient's risk factors (including CKD and hypertension). Electronically Signed   By: Feliberto Harts MD   On: 12/01/2020 13:41   ECHOCARDIOGRAM COMPLETE  Result Date: 11/30/2020    ECHOCARDIOGRAM REPORT   Patient Name:   [facility] Date of Exam: 11/30/2020 Medical Rec #:  [id number]          Height:       66.0 in  Accession #:    [id number]         Weight:       285.0 lb Date of Birth:  [birth year]          BSA:          2.325 m Patient Age:    49 years           BP:           189/93 mmHg Patient Gender: F                  HR:           83 bpm. Exam Location:  [facility] Procedure: 2D Echo, Cardiac Doppler and Color Doppler Indications:     CHF-acute diastolic I5.31  History:         Patient has no prior history of Echocardiogram examinations.                  Risk Factors:Hypertension. CKD.  Sonographer:     [name] RDCS (AE) Referring Phys:  [id number] Andris Baumann Diagnosing Phys: Arnoldo Hooker MD  Sonographer Comments: No apical window and Technically challenging study due to limited acoustic windows. IMPRESSIONS  1. Left ventricular ejection fraction, by estimation, is 55 to 60%. The left ventricle has normal function. The left ventricle has no regional wall motion abnormalities. Left ventricular diastolic parameters were normal.  2. Right ventricular systolic function is normal. The right ventricular size is normal.  3. The mitral valve is normal in structure. Trivial mitral valve regurgitation.  4. The aortic valve is normal in structure. Aortic valve regurgitation is not visualized. FINDINGS  Left Ventricle: Left ventricular ejection fraction, by estimation, is 55 to 60%. The left ventricle has normal function. The left ventricle has no regional wall motion abnormalities. The left ventricular internal cavity size was normal in size. There is  no left ventricular hypertrophy. Left ventricular diastolic parameters were normal. Right Ventricle: The right ventricular size is normal. No increase in right ventricular wall thickness. Right ventricular systolic function is normal. Left Atrium: Left atrial size was normal in size. Right Atrium: Right atrial size was normal in size. Pericardium: There is no evidence of pericardial effusion. Mitral Valve: The mitral valve is normal in structure. Trivial mitral  valve regurgitation.  Tricuspid Valve: The tricuspid valve is normal in structure. Tricuspid valve regurgitation is trivial. Aortic Valve: The aortic valve is normal in structure. Aortic valve regurgitation is not visualized. Pulmonic Valve: The pulmonic valve was normal in structure. Pulmonic valve regurgitation is not visualized. Aorta: The aortic root and ascending aorta are structurally normal, with no evidence of dilitation. IAS/Shunts: No atrial level shunt detected by color flow Doppler.  LEFT VENTRICLE PLAX 2D LVIDd:         5.55 cm LVIDs:         3.43 cm LV PW:         1.56 cm LV IVS:        1.06 cm LVOT diam:     2.20 cm LVOT Area:     3.80 cm  LEFT ATRIUM         Index LA diam:    4.60 cm 1.98 cm/m                        PULMONIC VALVE AORTA                 PV Vmax:        0.83 m/s Ao Root diam: 3.00 cm PV Peak grad:   2.8 mmHg                       RVOT Peak grad: 4 mmHg   SHUNTS Systemic Diam: 2.20 cm Arnoldo Hooker MD Electronically signed by Arnoldo Hooker MD Signature Date/Time: 11/30/2020/1:14:25 PM    Final       Labs: BNP (last 3 results) Recent Labs    11/29/20 1858  BNP 1,117.9*   Basic Metabolic Panel: Recent Labs  Lab 11/29/20 1858 11/30/20 0642 12/01/20 1006 12/02/20 0649 12/03/20 0429  NA 140  --  139 137 136  K 4.4  --  3.4* 3.4* 4.0  CL 103  --  95* 94* 95*  CO2 31  --  35* 38* 35*  GLUCOSE 100*  --  96 83 102*  BUN 17  --  21* 23* 24*  CREATININE 1.29* 1.31* 1.49* 1.61* 1.48*  CALCIUM 9.0  --  8.2* 8.3* 8.6*  MG  --   --  2.0 1.9 2.0   Liver Function Tests: No results for input(s): AST, ALT, ALKPHOS, BILITOT, PROT, ALBUMIN in the last 168 hours. No results for input(s): LIPASE, AMYLASE in the last 168 hours. Recent Labs  Lab 11/30/20 0642  AMMONIA 15   CBC: Recent Labs  Lab 11/29/20 1858 11/30/20 0642 12/01/20 1006 12/02/20 0649 12/03/20 0429  WBC 4.6 6.2 5.0 5.0 4.4  HGB 11.6* 12.2 11.6* 12.7 12.9  HCT 37.4 40.2 38.3 41.4 41.9  MCV 84.0 83.4 84.4 83.8 82.6  PLT  215 228 237 246 250   Cardiac Enzymes: No results for input(s): CKTOTAL, CKMB, CKMBINDEX, TROPONINI in the last 168 hours. BNP: Invalid input(s): POCBNP CBG: Recent Labs  Lab 11/30/20 0221  GLUCAP 94   D-Dimer No results for input(s): DDIMER in the last 72 hours. Hgb A1c No results for input(s): HGBA1C in the last 72 hours. Lipid Profile No results for input(s): CHOL, HDL, LDLCALC, TRIG, CHOLHDL, LDLDIRECT in the last 72 hours. Thyroid function studies No results for input(s): TSH, T4TOTAL, T3FREE, THYROIDAB in the last 72 hours.  Invalid input(s): FREET3 Anemia work up No results for input(s): VITAMINB12, FOLATE, FERRITIN, TIBC, IRON, RETICCTPCT in the last 72 hours.  Urinalysis    Component Value Date/Time   COLORURINE STRAW (A) 11/30/2020 0514   APPEARANCEUR CLEAR (A) 11/30/2020 0514   LABSPEC 1.005 11/30/2020 0514   PHURINE 7.0 11/30/2020 0514   GLUCOSEU NEGATIVE 11/30/2020 0514   HGBUR NEGATIVE 11/30/2020 0514   BILIRUBINUR NEGATIVE 11/30/2020 0514   KETONESUR NEGATIVE 11/30/2020 0514   PROTEINUR NEGATIVE 11/30/2020 0514   NITRITE NEGATIVE 11/30/2020 0514   LEUKOCYTESUR NEGATIVE 11/30/2020 0514   Sepsis Labs Invalid input(s): PROCALCITONIN,  WBC,  LACTICIDVEN Microbiology Recent Results (from the past 240 hour(s))  Resp Panel by RT-PCR (Flu A&B, Covid) Nasopharyngeal Swab     Status: None   Collection Time: 11/30/20 12:56 AM   Specimen: Nasopharyngeal Swab; Nasopharyngeal(NP) swabs in vial transport medium  Result Value Ref Range Status   SARS Coronavirus 2 by RT PCR NEGATIVE NEGATIVE Final    Comment: (NOTE) SARS-CoV-2 target nucleic acids are NOT DETECTED.  The SARS-CoV-2 RNA is generally detectable in upper respiratory specimens during the acute phase of infection. The lowest concentration of SARS-CoV-2 viral copies this assay can detect is 138 copies/mL. A negative result does not preclude SARS-Cov-2 infection and should not be used as the sole basis for  treatment or other patient management decisions. A negative result may occur with  improper specimen collection/handling, submission of specimen other than nasopharyngeal swab, presence of viral mutation(s) within the areas targeted by this assay, and inadequate number of viral copies(<138 copies/mL). A negative result must be combined with clinical observations, patient history, and epidemiological information. The expected result is Negative.  Fact Sheet for Patients:  BloggerCourse.com  Fact Sheet for Healthcare Providers:  SeriousBroker.it  This test is no t yet approved or cleared by the [place] FDA and  has been authorized for detection and/or diagnosis of SARS-CoV-2 by FDA under an Emergency Use Authorization (EUA). This EUA will remain  in effect (meaning this test can be used) for the duration of the COVID-19 declaration under Section 564(b)(1) of the Act, 21 U.S.C.section 360bbb-3(b)(1), unless the authorization is terminated  or revoked sooner.       Influenza A by PCR NEGATIVE NEGATIVE Final   Influenza B by PCR NEGATIVE NEGATIVE Final    Comment: (NOTE) The Xpert Xpress SARS-CoV-2/FLU/RSV plus assay is intended as an aid in the diagnosis of influenza from Nasopharyngeal swab specimens and should not be used as a sole basis for treatment. Nasal washings and aspirates are unacceptable for Xpert Xpress SARS-CoV-2/FLU/RSV testing.  Fact Sheet for Patients: BloggerCourse.com  Fact Sheet for Healthcare Providers: SeriousBroker.it  This test is not yet approved or cleared by the [place] FDA and has been authorized for detection and/or diagnosis of SARS-CoV-2 by FDA under an Emergency Use Authorization (EUA). This EUA will remain in effect (meaning this test can be used) for the duration of the COVID-19 declaration under Section 564(b)(1) of the Act, 21  U.S.C. section 360bbb-3(b)(1), unless the authorization is terminated or revoked.  Performed at [facility], [address] [place], [place] [address]      Total time spend on discharging this patient, including the last patient exam, discussing the hospital stay, instructions for ongoing care as it relates to all pertinent caregivers, as well as preparing the medical discharge records, prescriptions, and/or referrals as applicable, is 40 minutes.    Sheila Priestly, MD  [facility] 12/03/2020, 2:52 PM

## 2020-12-03 NOTE — Care Management Important Message (Signed)
Important Message  Patient Details  Name: Sheila Hays MRN: [id number] Date of Birth: 1970-07-24   Medicare Important Message Given:  Yes     [name] 12/03/2020, 11:43 AM

## 2020-12-03 NOTE — Progress Notes (Signed)
Patient discharged to home, ambulated out of unit with Care RN, accompanied by fiancee with all belongings. VSS.  Steady gait.  Medications and discharge instructions reviewed with patient.  All questions answered.  PIV x1 removed, no bleeding, intact.  Patient verbalized understanding of signs and symptoms of infection.  Patient agreed to follow up with all appointments as listed on AVS.  Patient satisfied with overall care at [facility].

## 2020-12-03 NOTE — Plan of Care (Signed)
  Problem: Education: Goal: Knowledge of General Education information will improve Description: Including pain rating scale, medication(s)/side effects and non-pharmacologic comfort measures 12/03/2020 1551 by Orvan Seen, RN Outcome: Completed/Met 12/03/2020 1349 by Orvan Seen, RN Outcome: Progressing   Problem: Health Behavior/Discharge Planning: Goal: Ability to manage health-related needs will improve 12/03/2020 1551 by Orvan Seen, RN Outcome: Completed/Met 12/03/2020 1349 by Orvan Seen, RN Outcome: Progressing   Problem: Clinical Measurements: Goal: Ability to maintain clinical measurements within normal limits will improve 12/03/2020 1551 by Orvan Seen, RN Outcome: Completed/Met 12/03/2020 1349 by Orvan Seen, RN Outcome: Progressing Goal: Will remain free from infection 12/03/2020 1551 by Orvan Seen, RN Outcome: Completed/Met 12/03/2020 1349 by Orvan Seen, RN Outcome: Progressing Goal: Diagnostic test results will improve 12/03/2020 1551 by Orvan Seen, RN Outcome: Completed/Met 12/03/2020 1349 by Orvan Seen, RN Outcome: Progressing Goal: Respiratory complications will improve 12/03/2020 1551 by Orvan Seen, RN Outcome: Completed/Met 12/03/2020 1349 by Orvan Seen, RN Outcome: Progressing Goal: Cardiovascular complication will be avoided 12/03/2020 1551 by Orvan Seen, RN Outcome: Completed/Met 12/03/2020 1349 by Orvan Seen, RN Outcome: Progressing   Problem: Activity: Goal: Risk for activity intolerance will decrease 12/03/2020 1551 by Orvan Seen, RN Outcome: Completed/Met 12/03/2020 1349 by Orvan Seen, RN Outcome: Progressing   Problem: Nutrition: Goal: Adequate nutrition will be maintained 12/03/2020 1551 by Orvan Seen, RN Outcome: Completed/Met 12/03/2020 1349 by Orvan Seen, RN Outcome: Progressing   Problem: Coping: Goal: Level of anxiety will decrease 12/03/2020  1551 by Orvan Seen, RN Outcome: Completed/Met 12/03/2020 1349 by Orvan Seen, RN Outcome: Progressing   Problem: Elimination: Goal: Will not experience complications related to bowel motility 12/03/2020 1551 by Orvan Seen, RN Outcome: Completed/Met 12/03/2020 1349 by Orvan Seen, RN Outcome: Progressing Goal: Will not experience complications related to urinary retention 12/03/2020 1551 by Orvan Seen, RN Outcome: Completed/Met 12/03/2020 1349 by Orvan Seen, RN Outcome: Progressing   Problem: Pain Managment: Goal: General experience of comfort will improve 12/03/2020 1551 by Orvan Seen, RN Outcome: Completed/Met 12/03/2020 1349 by Orvan Seen, RN Outcome: Progressing   Problem: Safety: Goal: Ability to remain free from injury will improve 12/03/2020 1551 by Orvan Seen, RN Outcome: Completed/Met 12/03/2020 1349 by Orvan Seen, RN Outcome: Progressing   Problem: Skin Integrity: Goal: Risk for impaired skin integrity will decrease 12/03/2020 1551 by Orvan Seen, RN Outcome: Completed/Met 12/03/2020 1349 by Orvan Seen, RN Outcome: Progressing   Problem: Education: Goal: Ability to demonstrate management of disease process will improve 12/03/2020 1551 by Orvan Seen, RN Outcome: Completed/Met 12/03/2020 1349 by Orvan Seen, RN Outcome: Progressing Goal: Ability to verbalize understanding of medication therapies will improve 12/03/2020 1551 by Orvan Seen, RN Outcome: Completed/Met 12/03/2020 1349 by Orvan Seen, RN Outcome: Progressing Goal: Individualized Educational Video(s) 12/03/2020 1551 by Orvan Seen, RN Outcome: Completed/Met 12/03/2020 1349 by Orvan Seen, RN Outcome: Progressing   Problem: Activity: Goal: Capacity to carry out activities will improve 12/03/2020 1551 by Orvan Seen, RN Outcome: Completed/Met 12/03/2020 1349 by Orvan Seen, RN Outcome: Progressing    Problem: Cardiac: Goal: Ability to achieve and maintain adequate cardiopulmonary perfusion will improve 12/03/2020 1551 by Orvan Seen, RN Outcome: Completed/Met 12/03/2020 1349 by Orvan Seen, RN Outcome: Progressing

## 2020-12-03 NOTE — Plan of Care (Signed)

## 2020-12-17 NOTE — Progress Notes (Signed)
Patient ID: Sheila Hays, female    DOB: 10-02-1970, 50 y.o.   MRN: [id number]  HPI  Ms Mcsorley is a 50 y/o female with a history of HTN, CKD, GERD, seizures and chronic heart failure.   Echo report from 11/30/20 reviewed and showed an EF of 55-60% without LVH.   Admitted 11/29/20 due to shortness of breath and edema. BP elevated. Initially given IV lasix, hydralazine and vasotec with transition to oral medications. Neurology consult obtained due to acute confusion. Head CT/ MRI negative. Keppra given due to history of seizures. Discharged after 4 days.   She presents today for her initial visit with a chief complaint of minimal shortness of breath upon moderate exertion. She describes this as having been present for several months. She has associated pedal edema (resolves overnight), snoring and light-headedness along with this. She denies any difficulty sleeping, abdominal distention, palpitations, chest pain, cough or fatigue.   Does not have scales so hasn't been weighing daily. Seeing nephrology tomorrow.   Past Medical History:  Diagnosis Date   CHF (congestive heart failure) (HCC)    Chronic kidney disease (CKD), stage III (moderate) (HCC)    GERD (gastroesophageal reflux disease)    Hypertension    Seizures (HCC)    Followed at [facility]   History reviewed. No pertinent surgical history. Family History  Problem Relation Age of Onset   Hypertension Mother    Hypertension Father    Seizures Sister    Social History   Tobacco Use   Smoking status: Former   Smokeless tobacco: Former    Quit date: 03/06/2017  Substance Use Topics   Alcohol use: Not on file   Allergies  Allergen Reactions   Ciprofloxacin Itching   Prior to Admission medications   Medication Sig Start Date End Date Taking? Authorizing Provider  albuterol (VENTOLIN HFA) 108 (90 Base) MCG/ACT inhaler Inhale 2 puffs into the lungs every 4 (four) hours as needed. 10/16/20  Yes [provider]  aspirin EC  81 MG tablet Take 81 mg by mouth daily.   Yes [provider]  atorvastatin (LIPITOR) 40 MG tablet Take 1 tablet (40 mg total) by mouth daily at 6 PM. 03/06/18  Yes Arty Baumgartner, NP  butalbital-acetaminophen-caffeine (FIORICET) [phone] MG tablet Take 1 tablet by mouth 2 (two) times daily as needed for headache. 12/03/20  Yes Darlin Priestly, MD  carvedilol (COREG) 25 MG tablet Take 1 tablet (25 mg total) by mouth 2 (two) times daily with a meal. 03/06/18  Yes Arty Baumgartner, NP  folic acid (FOLVITE) 400 MCG tablet Take 400 mcg by mouth daily.   Yes [provider]  furosemide (LASIX) 20 MG tablet Take 60 mg by mouth daily.   Yes [provider]  isosorbide mononitrate (IMDUR) 30 MG 24 hr tablet Take 30 mg by mouth daily. 10/19/20  Yes [provider]  levETIRAcetam (KEPPRA) 500 MG tablet Take 1 tablet (500 mg total) by mouth 2 (two) times daily. 12/03/20 03/03/21 Yes Darlin Priestly, MD  nitroGLYCERIN (NITROSTAT) 0.4 MG SL tablet Place 1 tablet (0.4 mg total) under the tongue every 5 (five) minutes as needed. 12/03/20  Yes Darlin Priestly, MD  omeprazole (PRILOSEC) 20 MG capsule Take 20 mg by mouth daily.   Yes [provider]  oxcarbazepine (TRILEPTAL) 600 MG tablet Take 600 mg by mouth 2 (two) times daily.   Yes [provider]  Potassium Chloride ER 20 MEQ TBCR Take 1 tablet by mouth daily.  10/19/20  Yes [provider]  SYMBICORT 160-4.5 MCG/ACT inhaler Inhale 2 puffs into the lungs in the morning and at bedtime. 10/12/20  Yes [provider]  traZODone (DESYREL) 100 MG tablet Take 100 mg by mouth at bedtime as needed for sleep.   Yes [provider]  vitamin C (ASCORBIC ACID) 500 MG tablet Take 500 mg by mouth daily.   Yes [provider]  hydrALAZINE (APRESOLINE) 100 MG tablet Hold until followup with your outpatient doctor because you blood pressure was normal without this. Patient not taking: Reported on 12/18/2020  12/03/20   Darlin Priestly, MD  losartan (COZAAR) 50 MG tablet Take 50 mg by mouth daily. Patient not taking: Reported on 12/18/2020 10/19/20   [provider]    Review of Systems  Constitutional:  Negative for appetite change and fatigue.  HENT:  Negative for congestion, postnasal drip and sore throat.   Eyes: Negative.   Respiratory:  Positive for shortness of breath ("very little"). Negative for cough and chest tightness.        + snoring  Cardiovascular:  Positive for leg swelling (at end of the day; resolves overnight). Negative for chest pain and palpitations.  Gastrointestinal:  Negative for abdominal distention and abdominal pain.  Endocrine: Negative.   Genitourinary: Negative.   Musculoskeletal:  Negative for back pain and neck pain.  Skin: Negative.   Allergic/Immunologic: Negative.   Neurological:  Positive for light-headedness. Negative for dizziness.  Hematological:  Negative for adenopathy. Does not bruise/bleed easily.  Psychiatric/Behavioral:  Negative for dysphoric mood and sleep disturbance (sleeping on 3 pillows). The patient is not nervous/anxious.    Vitals:   12/18/20 1131  BP: 132/73  Pulse: 71  Resp: 16  SpO2: 98%  Weight: 272 lb (123.4 kg)  Height: 5\' 5"  (1.651 m)   Wt Readings from Last 3 Encounters:  12/18/20 272 lb (123.4 kg)  12/03/20 289 lb 12.8 oz (131.5 kg)  09/22/20 230 lb (104.3 kg)   Lab Results  Component Value Date   CREATININE 1.48 (H) 12/03/2020   CREATININE 1.61 (H) 12/02/2020   CREATININE 1.49 (H) 12/01/2020    Physical Exam Vitals and nursing note reviewed.  Constitutional:      Appearance: Normal appearance.  HENT:     Head: Normocephalic and atraumatic.  Cardiovascular:     Rate and Rhythm: Normal rate and regular rhythm.  Pulmonary:     Effort: Pulmonary effort is normal. No respiratory distress.     Breath sounds: No wheezing or rales.  Abdominal:     General: There is no distension.     Palpations: Abdomen is  soft.  Musculoskeletal:        General: No tenderness.     Cervical back: Normal range of motion and neck supple.     Right lower leg: No edema.     Left lower leg: No edema.  Skin:    General: Skin is warm and dry.  Neurological:     General: No focal deficit present.     Mental Status: She is alert and oriented to person, place, and time.  Psychiatric:        Mood and Affect: Mood normal.        Behavior: Behavior normal.        Thought Content: Thought content normal.     Assessment & Plan:  1: Chronic heart failure with preserved ejection fraction without structural changes- - NYHA class II - euvolemic today - scales given  and she was instructed to weigh every morning and call for an overnight weight gain of > 2 pounds or a weekly weight gain of > 5 pounds - not adding salt; reviewed the importance of reading food labels to keep her daily sodium intake to 2000mg  / day; low sodium cookbook given to her - drinking 60-90 ounces of fluid daily; advised to keep it closer to the 60-64 ounces/ day - sees cardiology ([place]) 01/03/21 - reviewed echo results - BNP 11/29/20 was 1117.9  2: HTN- - BP looks good today; continue medications; explained that she may need to resume losartan in the future for renal protection but will defer to nephrology since they are seeing her tomorrow - seeing PCP at [facility] next week, she thinks - CMP 12/13/20 reviewed and showed sodium 143, potassium 4.2, creatinine 1.8 and GFR 36 - seeing nephrology tomorrow [facility]); emphasized that she not be late to that appointment  3: Seizures- - saw neurology Marlene Bast) 12/13/20  4: Snoring/ ? Sleep apnea- - has not smoked in about 10 years - waiting on sleep study to get scheduled   Medication bottles reviewed.   Return in 6 weeks or sooner for any questions/problems before then.

## 2020-12-18 ENCOUNTER — Ambulatory Visit: Payer: Medicare Other | Attending: Family | Admitting: Family

## 2020-12-18 ENCOUNTER — Encounter: Payer: Self-pay | Admitting: Family

## 2020-12-18 ENCOUNTER — Other Ambulatory Visit: Payer: Self-pay

## 2020-12-18 VITALS — BP 132/73 | HR 71 | Resp 16 | Ht 65.0 in | Wt 272.0 lb

## 2020-12-18 DIAGNOSIS — Z87891 Personal history of nicotine dependence: Secondary | ICD-10-CM | POA: Diagnosis not present

## 2020-12-18 DIAGNOSIS — R42 Dizziness and giddiness: Secondary | ICD-10-CM | POA: Diagnosis not present

## 2020-12-18 DIAGNOSIS — Z79899 Other long term (current) drug therapy: Secondary | ICD-10-CM | POA: Diagnosis not present

## 2020-12-18 DIAGNOSIS — Z7982 Long term (current) use of aspirin: Secondary | ICD-10-CM | POA: Insufficient documentation

## 2020-12-18 DIAGNOSIS — Z7951 Long term (current) use of inhaled steroids: Secondary | ICD-10-CM | POA: Insufficient documentation

## 2020-12-18 DIAGNOSIS — R0602 Shortness of breath: Secondary | ICD-10-CM | POA: Insufficient documentation

## 2020-12-18 DIAGNOSIS — Z7901 Long term (current) use of anticoagulants: Secondary | ICD-10-CM | POA: Insufficient documentation

## 2020-12-18 DIAGNOSIS — Z8249 Family history of ischemic heart disease and other diseases of the circulatory system: Secondary | ICD-10-CM | POA: Insufficient documentation

## 2020-12-18 DIAGNOSIS — I130 Hypertensive heart and chronic kidney disease with heart failure and stage 1 through stage 4 chronic kidney disease, or unspecified chronic kidney disease: Secondary | ICD-10-CM | POA: Diagnosis not present

## 2020-12-18 DIAGNOSIS — R569 Unspecified convulsions: Secondary | ICD-10-CM | POA: Diagnosis not present

## 2020-12-18 DIAGNOSIS — K219 Gastro-esophageal reflux disease without esophagitis: Secondary | ICD-10-CM | POA: Insufficient documentation

## 2020-12-18 DIAGNOSIS — N183 Chronic kidney disease, stage 3 unspecified: Secondary | ICD-10-CM | POA: Insufficient documentation

## 2020-12-18 DIAGNOSIS — G40219 Localization-related (focal) (partial) symptomatic epilepsy and epileptic syndromes with complex partial seizures, intractable, without status epilepticus: Secondary | ICD-10-CM

## 2020-12-18 DIAGNOSIS — I5032 Chronic diastolic (congestive) heart failure: Secondary | ICD-10-CM

## 2020-12-18 DIAGNOSIS — R0683 Snoring: Secondary | ICD-10-CM

## 2020-12-18 DIAGNOSIS — I10 Essential (primary) hypertension: Secondary | ICD-10-CM

## 2020-12-18 NOTE — Patient Instructions (Addendum)
Begin weighing daily and call for an overnight weight gain of > 2 pounds or a weekly weight gain of >5 pounds.    See about getting a new blood pressure cuff   Call kidney doctor to get the address for your appointment

## 2020-12-29 NOTE — Progress Notes (Deleted)
Cardiology Office Note    Date:  12/29/2020   ID:  Sheila Hays, Sheila Hays September 02, 1970, MRN [id number]  PCP:  [facility]  Cardiologist:  Peter Swaziland, MD (never seen by [facility] as an outpatient) Electrophysiologist:  None   Chief Complaint: Hospital follow-up  History of Present Illness:   Sheila Hays is a 50 y.o. female with history of chronic combined systolic and diastolic CHF with subsequent normalization of EF, HTN, seizure disorder, CKD stage III, COPD, headache disorder, obesity, and prior tobacco use who presents for hospital follow-up as outlined below.  Prior echo in 04/2015 at outside hospital demonstrated an EF of 25 to 30%, moderate concentric LVH, restrictive diastolic filling pattern, severely dilated left atrium, mildly dilated right ventricle with normal RV systolic function, moderately dilated right atrium, mild to moderate mitral regurgitation, and an estimated RVSP of 55 to 60 mmHg.  Subsequent nuclear stress testing at that time was not suggestive of myocardial ischemia with an EF of 35%.  Further details of testing at that time are unclear.  She did undergo echo in 03/2017 at [facility] which demonstrated an improvement in LV systolic function with an EF of 50 to 55%, moderate concentric LVH, normal LV cavity size, normal wall motion, and normal RV systolic function and ventricular cavity size.  She was seen by our team at [facility] in 02/2018 after being transferred from [facility] with chest pain that was felt to be atypical.  She underwent Lexiscan MPI during that admission which showed a small defect of mild severity present in the basal anteroseptal and mid anteroseptal location with perfusion defect possibly representing ischemia versus breast attenuation artifact.  EF 45 to 54%.  Overall, this was a low risk study.  We have not seen her since.  She did undergo echo through [facility] in 10/2019  which demonstrated an EF of 55 to 60%, mild to moderate concentric LVH, no regional wall motion abnormalities, diastolic dysfunction, mild to moderate mitral regurgitation, moderately dilated right atrium, moderately to severely dilated left atrium, normal RV systolic function with mild RV cavity enlargement.  More recently, she was admitted to [facility] from 7/7 through 7/11 for hypertensive emergency with BP of 220 mm systolic in the ED, metabolic encephalopathy, and evidence of volume overload.  She was managed by the hospitalist service with IV diuresis and escalation of antihypertensives.  High-sensitivity troponin 22 with a delta of 21.  BNP 1117.  Chest x-ray showed cardiac enlargement with vascular congestion.  Echo showed an EF of 55 to 60%, no regional wall motion abnormalities, normal RV systolic function and ventricular cavity size, and trivial mitral regurgitation.  CT head showed no acute intracranial abnormalities.  MRI of the brain showed no evidence of acute intracranial abnormality.  ***   Labs independently reviewed: 11/2020 - Hgb 14.2, PLT 301, potassium 4.2, BUN 34, serum creatinine 1.8, albumin 4.1, AST/ALT normal, magnesium 2.0 10/2019 - TSH normal 02/2018 - TC 152, TG 103, HDL 42, LDL 89 03/2017 - A1c 5.4  Past Medical History:  Diagnosis Date   CHF (congestive heart failure) (HCC)    Chronic kidney disease (CKD), stage III (moderate) (HCC)    GERD (gastroesophageal reflux disease)    Hypertension    Seizures (HCC)    Followed at [facility]    No past surgical history on file.  Current Medications: No outpatient medications have been marked as taking for the 01/03/21 encounter (Appointment) with  Sondra Barges, PA-C.    Allergies:   Ciprofloxacin   Social History   Socioeconomic History   Marital status: Single    Spouse name: Not on file   Number of children: Not on file   Years of education: Not on file   Highest education level: Not on file  Occupational History    Not on file  Tobacco Use   Smoking status: Former   Smokeless tobacco: Former    Quit date: 03/06/2017  Substance and Sexual Activity   Alcohol use: Not on file   Drug use: Not on file   Sexual activity: Not on file  Other Topics Concern   Not on file  Social History Narrative   Not on file   Social Determinants of Health   Financial Resource Strain: Not on file  Food Insecurity: Not on file  Transportation Needs: Not on file  Physical Activity: Not on file  Stress: Not on file  Social Connections: Not on file     Family History:  The patient's family history includes Hypertension in her father and mother; Seizures in her sister.  ROS:   ROS   EKGs/Labs/Other Studies Reviewed:    Studies reviewed were summarized above. The additional studies were reviewed today:  2D echo ([facility]) 04/2015: CONCLUSIONS-  The study quality is technically difficult.  The study is technically limited due to poor acoustic windows.  The left ventricular size is mild to moderately dilated.  Mild to moderate concentric left ventricular hypertrophy is observed.  There is severely decreased left ventricular systolic function.  The estimated ejection fraction is 25-30%.  The left ventricular diastolic filling pattern is restrictive.  The left ventricular diastolic filling pattern is consistent with  elevated mean left atrial pressure.  There is mild to moderate mitral regurgitation.  There is mild tricuspid regurgitation.  There is mild to moderate pulmonic regurgitation.  There is evidence of moderate pulmonary hypertension.  The right ventricular systolic pressure is estimated to be 55-60 mmHg.  A trivial pericardial effusion is visualized.  There is no patent foramen ovale visualized.  A patent foramen ovale is not demonstrated with color doppler and  agitated saline contrast.  __________  Nuclear stress test ([facility]) 04/2015: IMPRESSION-  1.  MYOCARDIAL PERFUSION STUDY  IS ABNORMAL AND SUGGESTIVE OF  MYOCARDIAL ISCHEMIA.   2.  THERE IS A PORTON OF THE BASE TO MID PORTION OF THE LATERAL  INFERIOR SEGMENT OF MODERATE SEVERE COUNTS THAT DID SEEM IMPROVED WITH  REST.   3.  GATED IMAGES CALCULATED AN EJECTION FRACTION OF 35%.  __________  2D echo ([facility]) 03/2017: Summary  Moderate concentric left ventricular hypertrophy  Normal left ventricular size and systolic function with no appreciable  segmental abnormality.  Ejection fraction is visually estimated at 50-55%  Normal right ventricle structure and function.  __________  Eugenie Birks MPI 02/2018: There was no ST segment deviation noted during stress. No T wave inversion was noted during stress. Defect 1: There is a small defect of mild severity present in the basal anteroseptal and mid anteroseptal location. Findings consistent with ischemia. This is a low risk study. The left ventricular ejection fraction is mildly decreased (45-54%). Anteroseptal perfusion defect does not correlate with the inferior wall motion abnormality. The anteroseptal defect may be either ischemia or breast attenuation artifact. __________  2D echo 10/2019 ([facility]): Conclusions:                The study quality is technically  difficult.  The study is technically limited due to poor acoustic windows.  The left ventricular size is mild to moderately dilated.  Mild to moderate concentric left ventricular hypertrophy is observed.  Global left ventricular wall motion and contractility are within normal  limits.  The estimated ejection fraction is 55-60%.  The left ventricular diastolic filling pattern is consistent with  pseudonormalization.  The left ventricular diastolic filling pattern is consistent with both  elevated mean left atrial pressure and elevated left ventricular  end-diastolic pressure.  There is mild to moderate mitral regurgitation.  There is mild tricuspid regurgitation.  There is mild to  moderate pulmonic regurgitation.  There is evidence that pulmonary hypertension may be underestimated.  The right ventricular systolic pressure is estimated to be 5-10 mmHg.  A trivial pericardial effusion is visualized.  There is no patent foramen ovale visualized.  A patent foramen ovale is not demonstrated by color Doppler.  There is no dilatation of the aortic root.  There is no dilatation of the ascending aorta.  __________  2D echo 11/2020:  1. Left ventricular ejection fraction, by estimation, is 55 to 60%. The  left ventricle has normal function. The left ventricle has no regional  wall motion abnormalities. Left ventricular diastolic parameters were  normal.   2. Right ventricular systolic function is normal. The right ventricular  size is normal.   3. The mitral valve is normal in structure. Trivial mitral valve  regurgitation.   4. The aortic valve is normal in structure. Aortic valve regurgitation is  not visualized.    EKG:  EKG is ordered today.  The EKG ordered today demonstrates ***  Recent Labs: 11/29/2020: B Natriuretic Peptide 1,117.9 12/03/2020: BUN 24; Creatinine, Ser 1.48; Hemoglobin 12.9; Magnesium 2.0; Platelets 250; Potassium 4.0; Sodium 136  Recent Lipid Panel    Component Value Date/Time   CHOL 152 03/06/2018 0315   TRIG 103 03/06/2018 0315   HDL 42 03/06/2018 0315   CHOLHDL 3.6 03/06/2018 0315   VLDL 21 03/06/2018 0315   LDLCALC 89 03/06/2018 0315    PHYSICAL EXAM:    VS:  There were no vitals taken for this visit.  BMI: There is no height or weight on file to calculate BMI.  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 Marland Kitchen or an APP in  ***.   Medication Adjustments/Labs and Tests Ordered: Current medicines are reviewed at length with the patient today.  Concerns regarding medicines are outlined above. Medication changes, Labs and Tests ordered today are summarized above and listed in the Patient Instructions accessible in Encounters.   Signed, Eula Listen, PA-C 12/29/2020 1:20 PM     [facility] -  [address] [place], [place] [address] [phone]

## 2021-01-03 ENCOUNTER — Ambulatory Visit: Payer: Medicare Other | Admitting: Physician Assistant

## 2021-02-13 ENCOUNTER — Ambulatory Visit: Payer: Medicare Other | Admitting: Family

## 2021-06-14 ENCOUNTER — Emergency Department
Admission: EM | Admit: 2021-06-14 | Discharge: 2021-06-14 | Discharge status: Against Medical Advice | Payer: Medicare Other | Attending: Emergency Medicine | Admitting: Emergency Medicine

## 2021-06-14 ENCOUNTER — Other Ambulatory Visit: Payer: Self-pay

## 2021-06-14 DIAGNOSIS — Z5321 Procedure and treatment not carried out due to patient leaving prior to being seen by health care provider: Secondary | ICD-10-CM | POA: Diagnosis not present

## 2021-06-14 DIAGNOSIS — R110 Nausea: Secondary | ICD-10-CM | POA: Insufficient documentation

## 2021-06-14 DIAGNOSIS — R1012 Left upper quadrant pain: Secondary | ICD-10-CM | POA: Insufficient documentation

## 2021-06-14 DIAGNOSIS — R630 Anorexia: Secondary | ICD-10-CM | POA: Insufficient documentation

## 2021-06-14 LAB — COMPREHENSIVE METABOLIC PANEL
ALT: 19 U/L (ref 0–44)
AST: 17 U/L (ref 15–41)
Albumin: 3.5 g/dL (ref 3.5–5.0)
Alkaline Phosphatase: 102 U/L (ref 38–126)
Anion gap: 7 (ref 5–15)
BUN: 26 mg/dL — ABNORMAL HIGH (ref 6–20)
CO2: 29 mmol/L (ref 22–32)
Calcium: 8.9 mg/dL (ref 8.9–10.3)
Chloride: 102 mmol/L (ref 98–111)
Creatinine, Ser: 1.37 mg/dL — ABNORMAL HIGH (ref 0.44–1.00)
GFR, Estimated: 46 mL/min — ABNORMAL LOW (ref >60)
Glucose, Bld: 91 mg/dL (ref 70–99)
Potassium: 4.4 mmol/L (ref 3.5–5.1)
Sodium: 138 mmol/L (ref 135–145)
Total Bilirubin: 0.5 mg/dL (ref 0.3–1.2)
Total Protein: 7.5 g/dL (ref 6.5–8.1)

## 2021-06-14 LAB — URINALYSIS, ROUTINE W REFLEX MICROSCOPIC
Bilirubin Urine: NEGATIVE
Glucose, UA: NEGATIVE mg/dL
Hgb urine dipstick: NEGATIVE
Ketones, ur: NEGATIVE mg/dL
Nitrite: NEGATIVE
Protein, ur: 100 mg/dL — AB
Specific Gravity, Urine: 1.011 (ref 1.005–1.030)
WBC, UA: >50 WBC/hpf — ABNORMAL HIGH (ref 0–5)
pH: 5 (ref 5.0–8.0)

## 2021-06-14 LAB — CBC
HCT: 43.5 % (ref 36.0–46.0)
Hemoglobin: 13.7 g/dL (ref 12.0–15.0)
MCH: 28.2 pg (ref 26.0–34.0)
MCHC: 31.5 g/dL (ref 30.0–36.0)
MCV: 89.5 fL (ref 80.0–100.0)
Platelets: 223 10*3/uL (ref 150–400)
RBC: 4.86 MIL/uL (ref 3.87–5.11)
RDW: 15.9 % — ABNORMAL HIGH (ref 11.5–15.5)
WBC: 5.2 10*3/uL (ref 4.0–10.5)
nRBC: 0 % (ref 0.0–0.2)

## 2021-06-14 LAB — LIPASE, BLOOD: Lipase: 36 U/L (ref 11–51)

## 2021-06-14 NOTE — ED Provider Triage Note (Signed)
Emergency Medicine Provider Triage Evaluation Note  Lorenda Grecco, a 51 y.o. female  was evaluated in triage.  Pt complains of LUQ abdominal pain and anorexia. She reports 2 days of symptoms with associated nausea. She denies FCS, urinary symptom, constipation, or diarrhea.   Review of Systems  Positive: LUQ abd pain, nausea Negative: FCS  Physical Exam  BP (!) 197/110    Pulse 72    Temp 98.5 F (36.9 C) (Oral)    Resp 20    Ht 5\' 5"  (1.651 m)    Wt 104.3 kg    SpO2 98%    BMI 38.27 kg/m  Gen:   Awake, no distress   Resp:  Normal effort CTA MSK:   Moves extremities without difficulty  Other:  ABD: mildly tender to palp LUQ  Medical Decision Making  Medically screening exam initiated at 4:31 PM.  Appropriate orders placed.  Roanna Reaves was informed that the remainder of the evaluation will be completed by another provider, this initial triage assessment does not replace that evaluation, and the importance of remaining in the ED until their evaluation is complete.  Patient with ED evaluation of LUQ abd pain and nausea x 2 days.    Charna Archer, PA-C 06/14/21 1635

## 2021-06-14 NOTE — ED Triage Notes (Signed)
Pt to ED for LUQ pain for 2 days with nausea. Denies fevers, denies urinary sx. Ambulatory. NAD noted

## 2021-06-14 NOTE — ED Notes (Signed)
Called x 1 from [facility] to room, no answer

## 2021-08-29 ENCOUNTER — Emergency Department
Admission: EM | Admit: 2021-08-29 | Discharge: 2021-08-29 | Disposition: A | Discharge status: Home / Self Care | Payer: Medicare Other | Attending: Student in an Organized Health Care Education/Training Program | Admitting: Student in an Organized Health Care Education/Training Program

## 2021-08-29 ENCOUNTER — Other Ambulatory Visit: Payer: Self-pay

## 2021-08-29 ENCOUNTER — Emergency Department: Payer: Medicare Other

## 2021-08-29 DIAGNOSIS — I509 Heart failure, unspecified: Secondary | ICD-10-CM | POA: Diagnosis not present

## 2021-08-29 DIAGNOSIS — J209 Acute bronchitis, unspecified: Secondary | ICD-10-CM

## 2021-08-29 DIAGNOSIS — Z20822 Contact with and (suspected) exposure to covid-19: Secondary | ICD-10-CM | POA: Insufficient documentation

## 2021-08-29 DIAGNOSIS — R109 Unspecified abdominal pain: Secondary | ICD-10-CM | POA: Diagnosis present

## 2021-08-29 DIAGNOSIS — N189 Chronic kidney disease, unspecified: Secondary | ICD-10-CM | POA: Diagnosis not present

## 2021-08-29 DIAGNOSIS — J45909 Unspecified asthma, uncomplicated: Secondary | ICD-10-CM | POA: Insufficient documentation

## 2021-08-29 LAB — RESP PANEL BY RT-PCR (FLU A&B, COVID) ARPGX2
Influenza A by PCR: NEGATIVE
Influenza B by PCR: NEGATIVE
SARS Coronavirus 2 by RT PCR: NEGATIVE

## 2021-08-29 LAB — CBC
HCT: 42.9 % (ref 36.0–46.0)
Hemoglobin: 13.4 g/dL (ref 12.0–15.0)
MCH: 28.4 pg (ref 26.0–34.0)
MCHC: 31.2 g/dL (ref 30.0–36.0)
MCV: 90.9 fL (ref 80.0–100.0)
Platelets: 214 10*3/uL (ref 150–400)
RBC: 4.72 MIL/uL (ref 3.87–5.11)
RDW: 17.9 % — ABNORMAL HIGH (ref 11.5–15.5)
WBC: 8.5 10*3/uL (ref 4.0–10.5)
nRBC: 0 % (ref 0.0–0.2)

## 2021-08-29 LAB — LACTIC ACID, PLASMA: Lactic Acid, Venous: 1.1 mmol/L (ref 0.5–1.9)

## 2021-08-29 LAB — COMPREHENSIVE METABOLIC PANEL
ALT: 18 U/L (ref 0–44)
AST: 20 U/L (ref 15–41)
Albumin: 3.5 g/dL (ref 3.5–5.0)
Alkaline Phosphatase: 87 U/L (ref 38–126)
Anion gap: 11 (ref 5–15)
BUN: 19 mg/dL (ref 6–20)
CO2: 26 mmol/L (ref 22–32)
Calcium: 9.3 mg/dL (ref 8.9–10.3)
Chloride: 102 mmol/L (ref 98–111)
Creatinine, Ser: 1.27 mg/dL — ABNORMAL HIGH (ref 0.44–1.00)
GFR, Estimated: 51 mL/min — ABNORMAL LOW (ref >60)
Glucose, Bld: 98 mg/dL (ref 70–99)
Potassium: 4.2 mmol/L (ref 3.5–5.1)
Sodium: 139 mmol/L (ref 135–145)
Total Bilirubin: 0.7 mg/dL (ref 0.3–1.2)
Total Protein: 8.1 g/dL (ref 6.5–8.1)

## 2021-08-29 MED ORDER — CEPHALEXIN 500 MG PO CAPS
500.0000 mg | ORAL_CAPSULE | Freq: Once | ORAL | Status: AC
  Administered 2021-08-29: 500 mg via ORAL
  Filled 2021-08-29: qty 1

## 2021-08-29 MED ORDER — PREDNISONE 20 MG PO TABS
60.0000 mg | ORAL_TABLET | Freq: Once | ORAL | Status: AC
  Administered 2021-08-29: 60 mg via ORAL
  Filled 2021-08-29: qty 3

## 2021-08-29 MED ORDER — CEPHALEXIN 500 MG PO CAPS
500.0000 mg | ORAL_CAPSULE | Freq: Three times a day (TID) | ORAL | 0 refills | Status: AC
Start: 2021-08-29 — End: 2021-09-05

## 2021-08-29 MED ORDER — AZITHROMYCIN 250 MG PO TABS: ORAL_TABLET | ORAL | 0 refills | Status: DC

## 2021-08-29 MED ORDER — IPRATROPIUM-ALBUTEROL 0.5-2.5 (3) MG/3ML IN SOLN
3.0000 mL | Freq: Once | RESPIRATORY_TRACT | Status: AC
  Administered 2021-08-29: 3 mL via RESPIRATORY_TRACT
  Filled 2021-08-29: qty 3

## 2021-08-29 MED ORDER — PREDNISONE 20 MG PO TABS
40.0000 mg | ORAL_TABLET | Freq: Every day | ORAL | 0 refills | Status: AC
Start: 2021-08-29 — End: 2021-09-03

## 2021-08-29 MED ORDER — AZITHROMYCIN 500 MG PO TABS
500.0000 mg | ORAL_TABLET | Freq: Once | ORAL | Status: AC
Start: 2021-08-29 — End: 2021-08-29
  Administered 2021-08-29: 500 mg via ORAL
  Filled 2021-08-29: qty 1

## 2021-08-29 MED ORDER — ACETAMINOPHEN 325 MG PO TABS
650.0000 mg | ORAL_TABLET | Freq: Once | ORAL | Status: AC
  Administered 2021-08-29: 650 mg via ORAL
  Filled 2021-08-29: qty 2

## 2021-08-29 NOTE — ED Provider Notes (Signed)
? ?[facility] ?Provider Note ? ? ? Event Date/Time  ? First MD Initiated Contact with Patient 08/29/21 1035   ?  (approximate) ? ? ?History  ? ?Nasal Congestion ? ? ?HPI ? ?Sheila Hays is a 51 y.o. female with a history of CKD as well as CHF and asthma presents to the ER for 2 to 3 days of progressively worsening nasal congestion cough sore throat fevers and chills.  Denies any abdominal pain.  Has had some posttussive emesis.  Denies any dysuria no back pain no flank pain.  No recent antibiotics.  She does not smoke. ?  ? ? ?Physical Exam  ? ?Triage Vital Signs: ?ED Triage Vitals  ?Enc Vitals Group  ?   BP 08/29/21 0953 (!) 208/112  ?   Pulse Rate 08/29/21 0953 93  ?   Resp 08/29/21 0953 18  ?   Temp 08/29/21 0953 (!) 101 ?F (38.3 ?C)  ?   Temp Source 08/29/21 0953 Oral  ?   SpO2 08/29/21 0953 95 %  ?   Weight 08/29/21 0944 229 lb 4.5 oz (104 kg)  ?   Height 08/29/21 0955 5\' 5"  (1.651 m)  ?   Head Circumference --   ?   Peak Flow --   ?   Pain Score 08/29/21 0944 0  ?   Pain Loc --   ?   Pain Edu? --   ?   Excl. in GC? --   ? ? ?Most recent vital signs: ?Vitals:  ? 08/29/21 0953 08/29/21 1135  ?BP: (!) 208/112 (!) 209/102  ?Pulse: 93 88  ?Resp: 18 18  ?Temp: (!) 101 ?F (38.3 ?C)   ?SpO2: 95% 96%  ? ? ? ?Constitutional: Alert  ?Eyes: Conjunctivae are normal.  ?Head: Atraumatic. ?Nose: No congestion/rhinnorhea. ?Mouth/Throat: Mucous membranes are moist.  Bilateral tonsillar erythema uvula is midline no exudates. ?Neck: Painless ROM.  ?Cardiovascular:   Good peripheral circulation. No m/g/r ?Respiratory: Normal respiratory effort.  Scattered expiratory wheeze throughout ?Gastrointestinal: Soft and nontender.  ?Musculoskeletal:  no deformity ?Neurologic:  MAE spontaneously. No gross focal neurologic deficits are appreciated.  ?Skin:  Skin is warm, dry and intact. No rash noted. ?Psychiatric: Mood and affect are normal. Speech and behavior are normal. ? ? ? ?ED Results / Procedures /  Treatments  ? ?Labs ?(all labs ordered are listed, but only abnormal results are displayed) ?Labs Reviewed  ?CBC - Abnormal; Notable for the following components:  ?    Result Value  ? RDW 17.9 (*)   ? All other components within normal limits  ?COMPREHENSIVE METABOLIC PANEL - Abnormal; Notable for the following components:  ? Creatinine, Ser 1.27 (*)   ? GFR, Estimated 51 (*)   ? All other components within normal limits  ?RESP PANEL BY RT-PCR (FLU A&B, COVID) ARPGX2  ?LACTIC ACID, PLASMA  ? ? ? ?EKG ? ? ? ? ?RADIOLOGY ?Please see ED Course for my review and interpretation. ? ?I personally reviewed all radiographic images ordered to evaluate for the above acute complaints and reviewed radiology reports and findings.  These findings were personally discussed with the patient.  Please see medical record for radiology report. ? ? ? ?PROCEDURES: ? ?Critical Care performed: No ? ?Procedures ? ? ?MEDICATIONS ORDERED IN ED: ?Medications  ?cephALEXin (KEFLEX) capsule 500 mg (has no administration in time range)  ?azithromycin (ZITHROMAX) tablet 500 mg (has no administration in time range)  ?ipratropium-albuterol (DUONEB) 0.5-2.5 (3) MG/3ML nebulizer solution 3  mL (3 mLs Nebulization Given 08/29/21 1105)  ?predniSONE (DELTASONE) tablet 60 mg (60 mg Oral Given 08/29/21 1104)  ?acetaminophen (TYLENOL) tablet 650 mg (650 mg Oral Given 08/29/21 1105)  ? ? ? ?IMPRESSION / MDM / ASSESSMENT AND PLAN / ED COURSE  ?I reviewed the triage vital signs and the nursing notes. ?             ?               ? ?Differential diagnosis includes, but is not limited to, pneumonia, bronchitis, viral illness, sinusitis, pharyngitis, sepsis, asthma ? ?Patient presented to the ER with symptoms as described above febrile mildly hypertensive does have wheezing on exam consistent with acute bronchitis possible pneumonia.  Blood work as well as imaging will be ordered for the above differential will provide prednisone and nebulizer she does have wheezing on  exam and at the very least has acute bronchitis. ?Clinical Course as of 08/29/21 1231  ?Thu Aug 29, 2021  ?1102 Chest x-ray on my review and interpretation does not show pneumothorax or consolidation will await formal radiology report. [PR]  ?1226 Patient reassessed.  Feels much improved after nebulizer and tolerated steroid.  Blood work is reassuring no sign of sepsis.  Will treat for bronchitis with antibiotics given concern for possible early pneumonia but her port score is 62 therefore low risk and appropriate for trial of outpatient management.   [PR]  ?  ?Clinical Course User Index ?[PR] Willy Eddy, MD  ? ? ? ?FINAL CLINICAL IMPRESSION(S) / ED DIAGNOSES  ? ?Final diagnoses:  ?Acute bronchitis, unspecified organism  ? ? ? ?Rx / DC Orders  ? ?ED Discharge Orders   ? ?      Ordered  ?  cephALEXin (KEFLEX) 500 MG capsule  3 times daily       ? 08/29/21 1230  ?  azithromycin (ZITHROMAX) 250 MG tablet       ? 08/29/21 1230  ?  predniSONE (DELTASONE) 20 MG tablet  Daily       ? 08/29/21 1230  ? ?  ?  ? ?  ? ? ? ?Note:  This document was prepared using Dragon voice recognition software and may include unintentional dictation errors. ? ?  ?Willy Eddy, MD ?08/29/21 1231 ? ?

## 2021-08-29 NOTE — ED Triage Notes (Signed)
Pt comes pov with congestion for 2 days ?

## 2021-08-29 NOTE — ED Notes (Signed)
AVS with prescriptions provided to and discussed with patient. Pt verbalizes understanding of discharge instructions and denies any questions or concerns at this time. Pt ambulated out of department independently with steady gait. ? ?

## 2021-08-29 NOTE — ED Notes (Signed)
NT completed blood-work and collected resp panel swab.  ?

## 2021-09-25 ENCOUNTER — Other Ambulatory Visit: Payer: Self-pay | Admitting: Internal Medicine

## 2021-09-25 ENCOUNTER — Ambulatory Visit
Admission: RE | Admit: 2021-09-25 | Discharge: 2021-09-25 | Disposition: A | Discharge status: Home / Self Care | Payer: Medicare Other | Source: Ambulatory Visit | Attending: Internal Medicine | Admitting: Internal Medicine

## 2021-09-25 DIAGNOSIS — R0602 Shortness of breath: Secondary | ICD-10-CM

## 2021-09-25 DIAGNOSIS — I509 Heart failure, unspecified: Secondary | ICD-10-CM

## 2021-09-27 ENCOUNTER — Ambulatory Visit
Admission: RE | Admit: 2021-09-27 | Discharge: 2021-09-27 | Disposition: A | Discharge status: Home / Self Care | Payer: Medicare Other | Source: Ambulatory Visit | Attending: Internal Medicine | Admitting: Internal Medicine

## 2021-09-27 DIAGNOSIS — R0602 Shortness of breath: Secondary | ICD-10-CM | POA: Diagnosis present

## 2021-09-27 DIAGNOSIS — E669 Obesity, unspecified: Secondary | ICD-10-CM | POA: Insufficient documentation

## 2021-09-27 DIAGNOSIS — R9431 Abnormal electrocardiogram [ECG] [EKG]: Secondary | ICD-10-CM | POA: Diagnosis not present

## 2021-09-27 DIAGNOSIS — I10 Essential (primary) hypertension: Secondary | ICD-10-CM | POA: Insufficient documentation

## 2021-11-20 ENCOUNTER — Inpatient Hospital Stay
Admission: EM | Admit: 2021-11-20 | Discharge: 2021-12-06 | DRG: 286 | Disposition: A | Discharge status: Home / Self Care | Payer: Medicare Other | Attending: Osteopathic Medicine | Admitting: Osteopathic Medicine

## 2021-11-20 ENCOUNTER — Encounter: Payer: Self-pay | Admitting: Internal Medicine

## 2021-11-20 ENCOUNTER — Emergency Department: Payer: Medicare Other

## 2021-11-20 ENCOUNTER — Other Ambulatory Visit: Payer: Self-pay

## 2021-11-20 DIAGNOSIS — I130 Hypertensive heart and chronic kidney disease with heart failure and stage 1 through stage 4 chronic kidney disease, or unspecified chronic kidney disease: Secondary | ICD-10-CM | POA: Diagnosis not present

## 2021-11-20 DIAGNOSIS — I248 Other forms of acute ischemic heart disease: Secondary | ICD-10-CM | POA: Diagnosis present

## 2021-11-20 DIAGNOSIS — E785 Hyperlipidemia, unspecified: Secondary | ICD-10-CM | POA: Diagnosis present

## 2021-11-20 DIAGNOSIS — I5043 Acute on chronic combined systolic (congestive) and diastolic (congestive) heart failure: Secondary | ICD-10-CM | POA: Diagnosis present

## 2021-11-20 DIAGNOSIS — J9601 Acute respiratory failure with hypoxia: Secondary | ICD-10-CM | POA: Diagnosis present

## 2021-11-20 DIAGNOSIS — I10 Essential (primary) hypertension: Secondary | ICD-10-CM | POA: Diagnosis not present

## 2021-11-20 DIAGNOSIS — Z881 Allergy status to other antibiotic agents status: Secondary | ICD-10-CM

## 2021-11-20 DIAGNOSIS — I959 Hypotension, unspecified: Secondary | ICD-10-CM | POA: Diagnosis not present

## 2021-11-20 DIAGNOSIS — E8729 Other acidosis: Secondary | ICD-10-CM | POA: Clinically undetermined

## 2021-11-20 DIAGNOSIS — R079 Chest pain, unspecified: Secondary | ICD-10-CM | POA: Diagnosis present

## 2021-11-20 DIAGNOSIS — N179 Acute kidney failure, unspecified: Secondary | ICD-10-CM | POA: Diagnosis present

## 2021-11-20 DIAGNOSIS — I509 Heart failure, unspecified: Principal | ICD-10-CM

## 2021-11-20 DIAGNOSIS — N1831 Chronic kidney disease, stage 3a: Secondary | ICD-10-CM | POA: Diagnosis not present

## 2021-11-20 DIAGNOSIS — I161 Hypertensive emergency: Secondary | ICD-10-CM | POA: Diagnosis present

## 2021-11-20 DIAGNOSIS — Z7951 Long term (current) use of inhaled steroids: Secondary | ICD-10-CM

## 2021-11-20 DIAGNOSIS — R42 Dizziness and giddiness: Secondary | ICD-10-CM

## 2021-11-20 DIAGNOSIS — Z6841 Body Mass Index (BMI) 40.0 and over, adult: Secondary | ICD-10-CM

## 2021-11-20 DIAGNOSIS — I5033 Acute on chronic diastolic (congestive) heart failure: Secondary | ICD-10-CM | POA: Diagnosis present

## 2021-11-20 DIAGNOSIS — Z87891 Personal history of nicotine dependence: Secondary | ICD-10-CM

## 2021-11-20 DIAGNOSIS — I272 Pulmonary hypertension, unspecified: Secondary | ICD-10-CM | POA: Diagnosis present

## 2021-11-20 DIAGNOSIS — I429 Cardiomyopathy, unspecified: Secondary | ICD-10-CM | POA: Diagnosis present

## 2021-11-20 DIAGNOSIS — Z8249 Family history of ischemic heart disease and other diseases of the circulatory system: Secondary | ICD-10-CM

## 2021-11-20 DIAGNOSIS — I214 Non-ST elevation (NSTEMI) myocardial infarction: Secondary | ICD-10-CM | POA: Diagnosis present

## 2021-11-20 DIAGNOSIS — E662 Morbid (severe) obesity with alveolar hypoventilation: Secondary | ICD-10-CM | POA: Diagnosis present

## 2021-11-20 DIAGNOSIS — G40209 Localization-related (focal) (partial) symptomatic epilepsy and epileptic syndromes with complex partial seizures, not intractable, without status epilepticus: Secondary | ICD-10-CM | POA: Diagnosis present

## 2021-11-20 DIAGNOSIS — E872 Acidosis, unspecified: Secondary | ICD-10-CM | POA: Diagnosis present

## 2021-11-20 DIAGNOSIS — Z79899 Other long term (current) drug therapy: Secondary | ICD-10-CM

## 2021-11-20 DIAGNOSIS — N1832 Chronic kidney disease, stage 3b: Secondary | ICD-10-CM | POA: Diagnosis present

## 2021-11-20 DIAGNOSIS — E876 Hypokalemia: Secondary | ICD-10-CM | POA: Diagnosis present

## 2021-11-20 DIAGNOSIS — Z7982 Long term (current) use of aspirin: Secondary | ICD-10-CM

## 2021-11-20 DIAGNOSIS — T82594A Other mechanical complication of infusion catheter, initial encounter: Secondary | ICD-10-CM | POA: Diagnosis not present

## 2021-11-20 DIAGNOSIS — G40219 Localization-related (focal) (partial) symptomatic epilepsy and epileptic syndromes with complex partial seizures, intractable, without status epilepticus: Secondary | ICD-10-CM

## 2021-11-20 DIAGNOSIS — K219 Gastro-esophageal reflux disease without esophagitis: Secondary | ICD-10-CM | POA: Diagnosis present

## 2021-11-20 LAB — BRAIN NATRIURETIC PEPTIDE: B Natriuretic Peptide: 580.8 pg/mL — ABNORMAL HIGH (ref 0.0–100.0)

## 2021-11-20 LAB — BASIC METABOLIC PANEL
Anion gap: 8 (ref 5–15)
BUN: 18 mg/dL (ref 6–20)
CO2: 29 mmol/L (ref 22–32)
Calcium: 9.1 mg/dL (ref 8.9–10.3)
Chloride: 103 mmol/L (ref 98–111)
Creatinine, Ser: 1.36 mg/dL — ABNORMAL HIGH (ref 0.44–1.00)
GFR, Estimated: 47 mL/min — ABNORMAL LOW (ref >60)
Glucose, Bld: 86 mg/dL (ref 70–99)
Potassium: 4 mmol/L (ref 3.5–5.1)
Sodium: 140 mmol/L (ref 135–145)

## 2021-11-20 LAB — CBC
HCT: 38.5 % (ref 36.0–46.0)
Hemoglobin: 11.7 g/dL — ABNORMAL LOW (ref 12.0–15.0)
MCH: 26.7 pg (ref 26.0–34.0)
MCHC: 30.4 g/dL (ref 30.0–36.0)
MCV: 87.9 fL (ref 80.0–100.0)
Platelets: 222 10*3/uL (ref 150–400)
RBC: 4.38 MIL/uL (ref 3.87–5.11)
RDW: 16.6 % — ABNORMAL HIGH (ref 11.5–15.5)
WBC: 4.3 10*3/uL (ref 4.0–10.5)
nRBC: 0 % (ref 0.0–0.2)

## 2021-11-20 LAB — TROPONIN I (HIGH SENSITIVITY)
Troponin I (High Sensitivity): 23 ng/L — ABNORMAL HIGH (ref <18)
Troponin I (High Sensitivity): 21 ng/L — ABNORMAL HIGH (ref <18)

## 2021-11-20 LAB — POC URINE PREG, ED: Preg Test, Ur: NEGATIVE

## 2021-11-20 MED ORDER — ACETAMINOPHEN 325 MG PO TABS
650.0000 mg | ORAL_TABLET | Freq: Four times a day (QID) | ORAL | Status: AC | PRN
  Administered 2021-11-21 – 2021-11-22 (×3): 650 mg via ORAL
  Filled 2021-11-20 (×4): qty 2

## 2021-11-20 MED ORDER — LEVETIRACETAM 500 MG PO TABS
500.0000 mg | ORAL_TABLET | Freq: Two times a day (BID) | ORAL | Status: DC
  Administered 2021-11-20 – 2021-12-06 (×32): 500 mg via ORAL
  Filled 2021-11-20 (×33): qty 1

## 2021-11-20 MED ORDER — FUROSEMIDE 10 MG/ML IJ SOLN
40.0000 mg | Freq: Every day | INTRAMUSCULAR | Status: AC
  Administered 2021-11-21: 40 mg via INTRAVENOUS
  Filled 2021-11-20: qty 4

## 2021-11-20 MED ORDER — IRBESARTAN 150 MG PO TABS
75.0000 mg | ORAL_TABLET | Freq: Every day | ORAL | Status: DC
  Administered 2021-11-21 – 2021-11-28 (×7): 75 mg via ORAL
  Filled 2021-11-20 (×8): qty 1

## 2021-11-20 MED ORDER — LAMOTRIGINE 100 MG PO TABS
100.0000 mg | ORAL_TABLET | Freq: Two times a day (BID) | ORAL | Status: DC
Start: 2021-11-20 — End: 2021-11-20

## 2021-11-20 MED ORDER — OXCARBAZEPINE 300 MG PO TABS
600.0000 mg | ORAL_TABLET | Freq: Two times a day (BID) | ORAL | Status: DC
  Administered 2021-11-20 – 2021-12-06 (×32): 600 mg via ORAL
  Filled 2021-11-20 (×34): qty 2

## 2021-11-20 MED ORDER — LORAZEPAM 2 MG/ML IJ SOLN
2.0000 mg | INTRAMUSCULAR | Status: DC | PRN
Start: 2021-11-20 — End: 2021-12-06

## 2021-11-20 MED ORDER — LAMOTRIGINE 25 MG PO TABS
25.0000 mg | ORAL_TABLET | Freq: Two times a day (BID) | ORAL | Status: DC
  Administered 2021-11-20 – 2021-12-06 (×31): 25 mg via ORAL
  Filled 2021-11-20 (×33): qty 1

## 2021-11-20 MED ORDER — TRAZODONE HCL 100 MG PO TABS
100.0000 mg | ORAL_TABLET | Freq: Every evening | ORAL | Status: DC | PRN
Start: 2021-11-20 — End: 2021-12-06
  Administered 2021-11-20 – 2021-12-05 (×14): 100 mg via ORAL
  Filled 2021-11-20 (×15): qty 1

## 2021-11-20 MED ORDER — ACETAMINOPHEN 650 MG RE SUPP: 650.0000 mg | Freq: Four times a day (QID) | RECTAL | Status: AC | PRN

## 2021-11-20 MED ORDER — HYDRALAZINE HCL 20 MG/ML IJ SOLN
10.0000 mg | Freq: Four times a day (QID) | INTRAMUSCULAR | Status: AC | PRN
Start: 2021-11-20 — End: 2021-11-23
  Administered 2021-11-20: 10 mg via INTRAVENOUS
  Filled 2021-11-20: qty 1

## 2021-11-20 MED ORDER — NITROGLYCERIN 2 % TD OINT
1.0000 [in_us] | TOPICAL_OINTMENT | Freq: Four times a day (QID) | TRANSDERMAL | Status: DC | PRN
Start: 2021-11-20 — End: 2021-11-22
  Administered 2021-11-21 – 2021-11-22 (×3): 1 [in_us] via TOPICAL
  Filled 2021-11-20 (×3): qty 1

## 2021-11-20 MED ORDER — ATORVASTATIN CALCIUM 20 MG PO TABS
40.0000 mg | ORAL_TABLET | Freq: Every day | ORAL | Status: DC
  Administered 2021-11-20 – 2021-12-05 (×16): 40 mg via ORAL
  Filled 2021-11-20 (×16): qty 2

## 2021-11-20 MED ORDER — MOMETASONE FURO-FORMOTEROL FUM 200-5 MCG/ACT IN AERO: 2.0000 | INHALATION_SPRAY | Freq: Two times a day (BID) | RESPIRATORY_TRACT | Status: DC

## 2021-11-20 MED ORDER — ONDANSETRON HCL 4 MG PO TABS: 4.0000 mg | ORAL_TABLET | Freq: Four times a day (QID) | ORAL | Status: DC | PRN

## 2021-11-20 MED ORDER — ONDANSETRON HCL 4 MG/2ML IJ SOLN: 4.0000 mg | Freq: Four times a day (QID) | INTRAMUSCULAR | Status: DC | PRN

## 2021-11-20 MED ORDER — LINACLOTIDE 145 MCG PO CAPS: 145.0000 ug | ORAL_CAPSULE | Freq: Every day | ORAL | Status: DC | PRN

## 2021-11-20 MED ORDER — ISOSORBIDE MONONITRATE ER 30 MG PO TB24
30.0000 mg | ORAL_TABLET | Freq: Every day | ORAL | Status: DC
  Administered 2021-11-21 – 2021-11-29 (×9): 30 mg via ORAL
  Filled 2021-11-20 (×9): qty 1

## 2021-11-20 MED ORDER — MOMETASONE FURO-FORMOTEROL FUM 200-5 MCG/ACT IN AERO
2.0000 | INHALATION_SPRAY | Freq: Two times a day (BID) | RESPIRATORY_TRACT | Status: DC
  Administered 2021-11-20 – 2021-12-06 (×29): 2 via RESPIRATORY_TRACT
  Filled 2021-11-20 (×3): qty 8.8

## 2021-11-20 MED ORDER — CARVEDILOL 25 MG PO TABS
25.0000 mg | ORAL_TABLET | Freq: Two times a day (BID) | ORAL | Status: DC
  Administered 2021-11-20 – 2021-11-29 (×17): 25 mg via ORAL
  Filled 2021-11-20 (×17): qty 1

## 2021-11-20 MED ORDER — FOLIC ACID 1 MG PO TABS
500.0000 ug | ORAL_TABLET | Freq: Every day | ORAL | Status: DC
  Administered 2021-11-21 – 2021-12-06 (×16): 0.5 mg via ORAL
  Filled 2021-11-20 (×17): qty 1

## 2021-11-20 MED ORDER — FUROSEMIDE 10 MG/ML IJ SOLN
40.0000 mg | Freq: Once | INTRAMUSCULAR | Status: AC
  Administered 2021-11-20: 40 mg via INTRAVENOUS
  Filled 2021-11-20: qty 4

## 2021-11-20 MED ORDER — ALBUTEROL SULFATE (2.5 MG/3ML) 0.083% IN NEBU: 3.0000 mL | INHALATION_SOLUTION | RESPIRATORY_TRACT | Status: DC | PRN

## 2021-11-20 MED ORDER — BUTALBITAL-APAP-CAFFEINE 50-325-40 MG PO TABS
1.0000 | ORAL_TABLET | Freq: Two times a day (BID) | ORAL | Status: DC | PRN
  Administered 2021-11-21: 1 via ORAL
  Filled 2021-11-20: qty 1

## 2021-11-20 MED ORDER — ORAL CARE MOUTH RINSE: 15.0000 mL | OROMUCOSAL | Status: DC | PRN

## 2021-11-20 MED ORDER — SENNOSIDES-DOCUSATE SODIUM 8.6-50 MG PO TABS
1.0000 | ORAL_TABLET | Freq: Every evening | ORAL | Status: DC | PRN
Start: 2021-11-20 — End: 2021-12-06

## 2021-11-20 MED ORDER — ASCORBIC ACID 500 MG PO TABS
500.0000 mg | ORAL_TABLET | Freq: Every day | ORAL | Status: DC
  Administered 2021-11-21 – 2021-12-06 (×16): 500 mg via ORAL
  Filled 2021-11-20 (×16): qty 1

## 2021-11-20 MED ORDER — NITROGLYCERIN 2 % TD OINT
1.0000 [in_us] | TOPICAL_OINTMENT | Freq: Once | TRANSDERMAL | Status: AC
Start: 2021-11-20 — End: 2021-11-20
  Administered 2021-11-20: 1 [in_us] via TOPICAL
  Filled 2021-11-20: qty 1

## 2021-11-20 MED ORDER — ENOXAPARIN SODIUM 60 MG/0.6ML IJ SOSY
0.5000 mg/kg | PREFILLED_SYRINGE | INTRAMUSCULAR | Status: DC
  Administered 2021-11-20: 50 mg via SUBCUTANEOUS
  Filled 2021-11-20: qty 0.6

## 2021-11-20 MED ORDER — ENALAPRILAT 1.25 MG/ML IV SOLN
0.6250 mg | Freq: Once | INTRAVENOUS | Status: AC
  Administered 2021-11-20: 0.625 mg via INTRAVENOUS
  Filled 2021-11-20: qty 2

## 2021-11-20 MED ORDER — ASPIRIN 81 MG PO TBEC
81.0000 mg | DELAYED_RELEASE_TABLET | Freq: Every day | ORAL | Status: DC
  Administered 2021-11-21 – 2021-12-06 (×16): 81 mg via ORAL
  Filled 2021-11-20 (×16): qty 1

## 2021-11-20 MED ORDER — PANTOPRAZOLE SODIUM 40 MG PO TBEC
40.0000 mg | DELAYED_RELEASE_TABLET | Freq: Every day | ORAL | Status: DC
  Administered 2021-11-20 – 2021-12-06 (×17): 40 mg via ORAL
  Filled 2021-11-20 (×17): qty 1

## 2021-11-20 MED ORDER — ESCITALOPRAM OXALATE 10 MG PO TABS
20.0000 mg | ORAL_TABLET | Freq: Every morning | ORAL | Status: DC
  Administered 2021-11-21 – 2021-12-06 (×16): 20 mg via ORAL
  Filled 2021-11-20: qty 2
  Filled 2021-11-20: qty 1
  Filled 2021-11-20 (×3): qty 2
  Filled 2021-11-20 (×3): qty 1
  Filled 2021-11-20: qty 2
  Filled 2021-11-20: qty 1
  Filled 2021-11-20 (×6): qty 2

## 2021-11-20 NOTE — Assessment & Plan Note (Signed)
ACS ruled out.  Troponin elevation likely due to type II NSTEMI or demand ischemia.  No chest pain or acute changes on EKG Management as outlined and per cardiology

## 2021-11-20 NOTE — Assessment & Plan Note (Addendum)
Renal function near baseline on admission up trended with IV diuresis. Slowly improving since Lasix stopped. --Monitor renal function: Cr today 1.66 up from 1.46 yesterday

## 2021-11-20 NOTE — Assessment & Plan Note (Addendum)
Presented with hypertensive emergency with pulmonary edema and CHF decompensation.  BP now controlled but with intermittent hypotension.  --Coreg 25 mg BID --Imdur 30 mg daily --Irbesartan 75 mg daily --Hydralazine 10 mg IV PRN -- Cardiology added 5 mg amlodipine  BP is quite labile ranging in past 24 hours from 101/71 to 181/75

## 2021-11-20 NOTE — ED Notes (Signed)
RN to assist pt advocate to place pt in bed. Wheelchair and pt pants went from urine.

## 2021-11-20 NOTE — ED Notes (Signed)
Pt ambulated to toilet. Then back to bed.

## 2021-11-20 NOTE — Assessment & Plan Note (Addendum)
Body mass index is 46.9 kg/m. Complicates overall care and prognosis.  Recommend lifestyle modifications including physical activity and diet for weight loss and overall long-term health.  Highly suspect OSA and/or OHS. Recommend sleep study as outpatient. Qualifies for home O2 here.

## 2021-11-20 NOTE — ED Notes (Signed)
RN to bedside. Pt had a scheduled apt at 3pm. She called them and they agreed to see her early....she called for CP and SOB. PCP sent here for eval. Pt is CAOx4 and in no acute distress.

## 2021-11-20 NOTE — ED Notes (Signed)
Informed RN bed assigned 

## 2021-11-20 NOTE — ED Provider Notes (Signed)
[facility] Provider Note    Event Date/Time   First MD Initiated Contact with Patient 11/20/21 1535     (approximate)   History   Chest Pain and Shortness of Breath   HPI  Sheila Hays is a 51 y.o. female   past medical history of CHF, CKD, hypertension who presents with chest pain and shortness of breath.  Symptoms started today.  She endorses dyspnea and significant PND was up all night unable to sleep.  She feels like she has a lot of phlegm in her chest.  Also endorses central chest pressure that is constant nonexertional nonpleuritic.  Legs are swollen.  She has been compliant with her diuretic.  Denies fevers chills nausea vomiting.  She did take her blood pressure medication today.     Past Medical History:  Diagnosis Date   CHF (congestive heart failure) (HCC)    Chronic kidney disease (CKD), stage III (moderate) (HCC)    GERD (gastroesophageal reflux disease)    Hypertension    Seizures (HCC)    Followed at [facility]    Patient Active Problem List   Diagnosis Date Noted   Acute on chronic combined systolic and diastolic CHF with improved EF  11/30/2020   Complex partial seizure disorder (HCC) 11/30/2020   Acute metabolic encephalopathy 11/30/2020   Hypertensive emergency 11/30/2020   Obesity, Class III, BMI 40-49.9 (morbid obesity) (HCC) 04/27/2018   NSTEMI (non-ST elevated myocardial infarction) (HCC) 03/05/2018   Essential hypertension    Chronic kidney disease, stage 3a [facility])      Physical Exam  Triage Vital Signs: ED Triage Vitals  Enc Vitals Group     BP 11/20/21 1356 (!) 202/90     Pulse Rate 11/20/21 1356 65     Resp 11/20/21 1356 18     Temp 11/20/21 1356 98.5 F (36.9 C)     Temp Source 11/20/21 1356 Oral     SpO2 11/20/21 1356 95 %     Weight 11/20/21 1350 220 lb (99.8 kg)     Height 11/20/21 1350 5\' 6"  (1.676 m)     Head Circumference --      Peak Flow --      Pain Score 11/20/21 1350 8     Pain Loc --      Pain  Edu? --      Excl. in GC? --     Most recent vital signs: Vitals:   11/20/21 1356 11/20/21 1600  BP: (!) 202/90 (!) 190/90  Pulse: 65 70  Resp: 18 16  Temp: 98.5 F (36.9 C)   SpO2: 95% 95%     General: Awake, no distress.  Obese CV:  Good peripheral perfusion.  Resp:  Normal effort.  Crackles at the bases no increased work of breathing at rest Abd:  No distention.  Neuro:             Awake, Alert, Oriented x 3  Other:  1+ pitting edema bilateral lower extremities, chronic venous stasis changes   ED Results / Procedures / Treatments  Labs (all labs ordered are listed, but only abnormal results are displayed) Labs Reviewed  BASIC METABOLIC PANEL - Abnormal; Notable for the following components:      Result Value   Creatinine, Ser 1.36 (*)    GFR, Estimated 47 (*)    All other components within normal limits  CBC - Abnormal; Notable for the following components:   Hemoglobin 11.7 (*)    RDW  16.6 (*)    All other components within normal limits  BRAIN NATRIURETIC PEPTIDE - Abnormal; Notable for the following components:   B Natriuretic Peptide 580.8 (*)    All other components within normal limits  TROPONIN I (HIGH SENSITIVITY) - Abnormal; Notable for the following components:   Troponin I (High Sensitivity) 23 (*)    All other components within normal limits  TROPONIN I (HIGH SENSITIVITY) - Abnormal; Notable for the following components:   Troponin I (High Sensitivity) 21 (*)    All other components within normal limits  POC URINE PREG, ED     EKG  EKG interpretation performed by myself: NSR, LAD,  nml intervals, no acute ischemic changes    RADIOLOGY Chest x-ray reviewed by myself shows pulmonary vascular congestion and cardiomegaly   PROCEDURES:  Critical Care performed: Yes, see critical care procedure note(s)  Procedures  The patient is on the cardiac monitor to evaluate for evidence of arrhythmia and/or significant heart rate  changes.   MEDICATIONS ORDERED IN ED: Medications  nitroGLYCERIN (NITROGLYN) 2 % ointment 1 inch (has no administration in time range)  furosemide (LASIX) injection 40 mg (has no administration in time range)  enalaprilat (VASOTEC) injection 0.625 mg (has no administration in time range)     IMPRESSION / MDM / ASSESSMENT AND PLAN / ED COURSE  I reviewed the triage vital signs and the nursing notes.                              Patient's presentation is most consistent with acute presentation with potential threat to life or bodily function.  Differential diagnosis includes, but is not limited to, CHF exacerbation, acute coronary syndrome, pneumonia, pulmonary embolism, pleural effusion  The patient is a 51 year old female with HFpEF presenting with dyspnea.  She is hypertensive sats in the mid 90s on room air.  She is not in overt respiratory distress.  Symptoms started today she has had some chest discomfort as well cough feels like she is has phlegm.  No fevers chills nausea vomiting.  Says weight is near its baseline.  Has been compliant with her diuretic.  He has crackles at the bases looks volume overloaded.  Chest x-ray cardiomegaly and pulmonary vascular congestion.  Labs are overall reassuring.  Troponin is mildly elevated but flat.  Has been in the past as well for BNP evaded at 580.  Given her elevated blood pressure and evidence of volume overload and significant symptoms do think she should be admitted for diuresis and blood pressure control.  We will give a dose of Vasotec Nitropaste and Lasix.  Will discuss with hospitalist for admission.      FINAL CLINICAL IMPRESSION(S) / ED DIAGNOSES   Final diagnoses:  Acute on chronic congestive heart failure, unspecified heart failure type (HCC)     Rx / DC Orders   ED Discharge Orders     None        Note:  This document was prepared using Dragon voice recognition software and may include unintentional dictation  errors.   Georga Hacking, MD 11/20/21 1736

## 2021-11-20 NOTE — Hospital Course (Signed)
Sheila Hays is a 51 year old female with morbid obesity, hypertension, hyperlipidemia, chronic HFpEF, CKD 3B, who presented to the ED on 11/20/2021 for evaluation of shortness of breath and chest pain.  Initial blood pressure 202/90 in the ED with otherwise normal vitals, SPO2 of 95% on room air.  Labs were mostly unremarkable but BNP elevated 580.8.  Chest x-ray with cardiomegaly, prominent central pulmonary vessels concerning for CHF and sepsis small/minimal bilateral pleural effusions  Blood pressure was treated with Vasotec 0.625 mg IV in the ED, also given furosemide 40 mg IV.  Admitted to the hospital for further evaluation management of acute on chronic HFpEF requiring IV diuresis, close monitoring and management of hypertensive urgency.

## 2021-11-20 NOTE — Assessment & Plan Note (Addendum)
Presented with shortness of breath and chest pain, orthopnea.  Started on IV Lasix with good response. Echo on showed EF 55 to 60%, diagnostic function could not be evaluated, mild LVH, left atrium mild to moderately dilated. -- Cardiology consulted --Stop Lasix due to rising creatinine --Monitor renal function electrolytes -- Strict I/O's and daily weights

## 2021-11-20 NOTE — Assessment & Plan Note (Addendum)
-   Resumed ox carbamazepine 600 mg p.o. twice daily; Keppra 500 mg p.o. twice daily - Per med reconciliation, patient is supposed to be taking Lamictal 25 mg tablet, 4 pills twice daily however on extensive conversation with patient, she is taking it 25 mg p.o. twice daily - I am resuming Lamictal at 25 mg p.o. twice daily because lamictal requires gradual increase dosing - I recommend patient follow-up with primary care and/or neurology for further recommendations regarding Lamictal dosing - Ativan 2 mg IV as needed for seizure, 2 doses ordered

## 2021-11-20 NOTE — ED Triage Notes (Signed)
Pt here via [facility] with cp and sob that started last night. Pt does not radiate. Hx of copd, htn, chf. Pt went to her primary this morning and was given 2 duonebs, 125 mg of solu-medrol, and a clonidine at 1215 with no decrease in bp. 324 asa given by ems.   18G LAC 97% RA 214/102 108-cbg

## 2021-11-20 NOTE — ED Notes (Addendum)
RN to room. Pt sitting on bench seat. Pt bed is wet. Pt states "I think I had a seizure the bed is wet with urine". Pt was wet from urine when she was placed in bed from triage. Pt was unable to collect UA for staff. Pt states "I dropped the cup in the toilet". This RN placed hat in the toilet for next attempt to collect UA.

## 2021-11-20 NOTE — H&P (Signed)
History and Physical   Sheila Hays [id number] DOB: 1971-05-18 DOA: 11/20/2021  PCP: [facility]  Patient coming from: home  I have personally briefly reviewed patient's old medical records in [place].  Chief Concern: Shortness of breath  HPI: Sheila Hays is a 51 year old female with morbid obesity, hypertension, hyperlipidemia, heart failure preserved ejection fraction, CKD 3B, who presents emergency department for chief concerns of shortness of breath and chest pain.  Initial vitals in the emergency department showed temperature of 98.5, respiration rate of 18, heart rate 65, blood pressure 202/90, SPO2 of 95% on room air.  Serum sodium is 140, potassium 4.2, chloride 103, bicarb 29, BUN of 18, serum creatinine 1.36, GFR 47, nonfasting blood glucose 86, WBC was 4.3, hemoglobin 11.7, platelets of 222.  BNP was elevated at 580.8, high sensitive troponin was 23 and decreased to 21.  ED treatment: Vasotec 0.625 mg IV, furosemide 40 mg IV.  At bedside, she is sitting up and finishing her dinner of [place], green beans and mashed potatoes. She does not appear to be in acute distress. She is able to tell me her name, age, current calendar year and current location.  She reports that approximately at midnight, she was laying down when she developed shortness of breath. She got up and this improved. She endorsed pounding in the middle of her chest when she lays flat and when she coughs. She denies arm pain, neck and jaw pain. She endorses dry cough that is non productive. She states she felt this way about two months ago. She endorses dyspnea with exertion and swelling in her legs.   She reports the dyspnea is with exertion was improved when she was standing up.  She denies fever, nausea, vomiting, dysuria, dysphagia, hematuria, dirrhea. Her last BM was this AM and it was normal. She denies recent trauma to her person.  Daily fluid intake Water: mini water  bottles, about 10 oz each and she drinks about 10 of those per day Soda: seldom Juice: twice per week Coffee: none Tea: none Soup: seldom Etoh: none  Social history: She lives with a friend. She is former tobacco user, 1/2 ppd, quit 15-20 years. She denies etoh and recreational drug use. She is disabled and formerly worked as Therapist, sports.  ROS: Constitutional: no weight change, no fever ENT/Mouth: no sore throat, no rhinorrhea Eyes: no eye pain, no vision changes Cardiovascular: no chest pain, no dyspnea,  no edema, no palpitations Respiratory: no cough, no sputum, no wheezing Gastrointestinal: no nausea, no vomiting, no diarrhea, no constipation Genitourinary: no urinary incontinence, no dysuria, no hematuria Musculoskeletal: no arthralgias, no myalgias Skin: no skin lesions, no pruritus, Neuro: + weakness, no loss of consciousness, no syncope Psych: no anxiety, no depression, + decrease appetite Heme/Lymph: no bruising, no bleeding  ED Course: Discussed with emergency medicine provider, patient requiring hospitalization for chief concerns of new onset heart failure.  Assessment/Plan  Principal Problem:   Acute exacerbation of CHF (congestive heart failure) (HCC) Active Problems:   NSTEMI (non-ST elevated myocardial infarction) ([place])   Essential hypertension   Chronic kidney disease, stage 3a (HCC)   Complex partial seizure disorder ([place])   Morbid obesity ([place])   Assessment and Plan:  * Acute exacerbation of CHF (congestive heart failure) (HCC) - Complete echo ordered - Strict I's and O's - EDP ordered furosemide 40 mg IV one-time dose - I will order additional 40 mg of IV furosemide, for 11/21/2021 - AM team to consult  cardiology pending complete echo  Morbid obesity (HCC) - This meets criteria for morbid obesity based on the presence of 1 or more chronic comorbidities. Patient has BMI of 35.5 and hypertension. This complicates overall care and prognosis.   Complex partial  seizure disorder (HCC) - Resumed ox carbamazepine 600 mg p.o. twice daily; Keppra 500 mg p.o. twice daily - Per med reconciliation, patient is supposed to be taking Lamictal 25 mg tablet, 4 pills twice daily however on extensive conversation with patient, she is taking it 25 mg p.o. twice daily - I am resuming Lamictal at 25 mg p.o. twice daily because lamictal requires gradual increase dosing - I recommend patient follow-up with primary care and/or neurology for further recommendations regarding Lamictal dosing - Ativan 2 mg IV as needed for seizure, 2 doses ordered  Chronic kidney disease, stage 3a (HCC) - At baseline  Essential hypertension - I have resumed isosorbide mononitrate 30 mg daily, irbesartan 75 mg daily, carvedilol 25 mg p.o. twice daily - Hydralazine 10 mg IV every 6 hours.  For SBP greater than 175, 3 days ordered  NSTEMI (non-ST elevated myocardial infarction) (HCC) - Presumptive diagnosis is secondary to heart failure exacerbation - Treat as above  Chart reviewed.   DVT prophylaxis: Enoxaparin Code Status: Full code Diet: Heart healthy Family Communication: No Disposition Plan: Pending clinical course Consults called: None at this time Admission status: Telemetry cardiac  Past Medical History:  Diagnosis Date   CHF (congestive heart failure) (HCC)    Chronic kidney disease (CKD), stage III (moderate) (HCC)    GERD (gastroesophageal reflux disease)    Hypertension    Seizures (HCC)    [facility]   History reviewed. No pertinent surgical history.  Social History:  reports that she has quit smoking. She quit smokeless tobacco use about 4 years ago. She reports that she does not currently use alcohol. She reports that she does not use drugs.  Allergies  Allergen Reactions   Ciprofloxacin Itching   Family History  Problem Relation Age of Onset   Hypertension Mother    Hypertension Father    Heart failure Sister    Diabetes Sister    Seizures  Sister    Family history: Family history reviewed and not pertinent  Prior to Admission medications   Medication Sig Start Date End Date Taking? Authorizing Provider  albuterol (VENTOLIN HFA) 108 (90 Base) MCG/ACT inhaler Inhale 2 puffs into the lungs every 4 (four) hours as needed. 10/16/20   [provider]  aspirin EC 81 MG tablet Take 81 mg by mouth daily.    [provider]  atorvastatin (LIPITOR) 40 MG tablet Take 1 tablet (40 mg total) by mouth daily at 6 PM. 03/06/18   Arty Baumgartner, NP  azithromycin (ZITHROMAX) 250 MG tablet Take one daily 08/29/21   Willy Eddy, MD  butalbital-acetaminophen-caffeine (FIORICET) [id number] MG tablet Take 1 tablet by mouth 2 (two) times daily as needed for headache. 12/03/20   Darlin Priestly, MD  carvedilol (COREG) 25 MG tablet Take 1 tablet (25 mg total) by mouth 2 (two) times daily with a meal. 03/06/18   Arty Baumgartner, NP  folic acid (FOLVITE) 400 MCG tablet Take 400 mcg by mouth daily.    [provider]  furosemide (LASIX) 20 MG tablet Take 60 mg by mouth daily.    [provider]  hydrALAZINE (APRESOLINE) 100 MG tablet Hold until followup with your outpatient doctor because you blood pressure was normal without this.  Patient not taking: Reported on 12/18/2020 12/03/20   Enzo Bi, MD  isosorbide mononitrate (IMDUR) 30 MG 24 hr tablet Take 30 mg by mouth daily. 10/19/20   [provider]  levETIRAcetam (KEPPRA) 500 MG tablet Take 1 tablet (500 mg total) by mouth 2 (two) times daily. 12/03/20 03/03/21  Enzo Bi, MD  losartan (COZAAR) 50 MG tablet Take 50 mg by mouth daily. Patient not taking: Reported on 12/18/2020 10/19/20   [provider]  nitroGLYCERIN (NITROSTAT) 0.4 MG SL tablet Place 1 tablet (0.4 mg total) under the tongue every 5 (five) minutes as needed. 12/03/20   Enzo Bi, MD  omeprazole (PRILOSEC) 20 MG capsule Take 20 mg by mouth daily.    [provider]  oxcarbazepine  (TRILEPTAL) 600 MG tablet Take 600 mg by mouth 2 (two) times daily.    [provider]  Potassium Chloride ER 20 MEQ TBCR Take 1 tablet by mouth daily. 10/19/20   [provider]  SYMBICORT 160-4.5 MCG/ACT inhaler Inhale 2 puffs into the lungs in the morning and at bedtime. 10/12/20   [provider]  traZODone (DESYREL) 100 MG tablet Take 100 mg by mouth at bedtime as needed for sleep.    [provider]  vitamin C (ASCORBIC ACID) 500 MG tablet Take 500 mg by mouth daily.    [provider]   Physical Exam: Vitals:   11/20/21 1600 11/20/21 1800 11/20/21 1830 11/20/21 2000  BP: (!) 190/90 140/60 (!) 153/117 (!) 186/93  Pulse: 70 66  74  Resp: 16 16 (!) 24 20  Temp:    98 F (36.7 C)  TempSrc:      SpO2: 95% 95%  99%  Weight:      Height:       Constitutional: appears older than chronological age, NAD, calm, comfortable Eyes: PERRL, lids and conjunctivae normal ENMT: Mucous membranes are moist. Posterior pharynx clear of any exudate or lesions. Age-appropriate dentition. Hearing appropriate Neck: normal, supple, no masses, no thyromegaly Respiratory: clear to auscultation bilaterally, no wheezing, no crackles. Normal respiratory effort. No accessory muscle use.  Cardiovascular: Regular rate and rhythm, no murmurs / rubs / gallops. No extremity edema. 2+ pedal pulses. No carotid bruits.  Abdomen: Morbid obesity, no tenderness, no masses palpated, no hepatosplenomegaly. Bowel sounds positive.  Musculoskeletal: no clubbing / cyanosis. No joint deformity upper and lower extremities. Good ROM, no contractures, no atrophy. Normal muscle tone.  Skin: no rashes, lesions, ulcers. No induration Neurologic: Sensation intact. Strength 5/5 in all 4.  Psychiatric: Normal judgment and insight. Alert and oriented x 3. Normal mood.   EKG: independently reviewed, showing sinus rhythm with rate of 66, QTc 469  Chest x-ray on Admission: I personally reviewed and  I agree with radiologist reading as below.  DG Chest 2 View  Result Date: 11/20/2021 CLINICAL DATA:  Chest pain, shortness of breath EXAM: CHEST - 2 VIEW COMPARISON:  Previous studies including the examination of 09/26/2018 FINDINGS: Transverse diameter of heart is increased. Central pulmonary vessels are more prominent. Increased interstitial markings are seen in the parahilar regions and lower lung fields. Lateral CP angles are clear. There is possible minimal blunting of posterior CP angles. There is no pneumothorax. IMPRESSION: Cardiomegaly. Central pulmonary vessels are prominent suggesting CHF. Possible minimal bilateral pleural effusions. Electronically Signed   By: Elmer Picker M.D.   On: 11/20/2021 14:26    Labs on Admission: I have personally reviewed following labs  CBC: Recent Labs  Lab 11/20/21  1352  WBC 4.3  HGB 11.7*  HCT 38.5  MCV 87.9  PLT 222   Basic Metabolic Panel: Recent Labs  Lab 11/20/21 1352  NA 140  K 4.0  CL 103  CO2 29  GLUCOSE 86  BUN 18  CREATININE 1.36*  CALCIUM 9.1   GFR: Estimated Creatinine Clearance: 57.7 mL/min (A) (by C-G formula based on SCr of 1.36 mg/dL (H)).  Urine analysis:    Component Value Date/Time   COLORURINE YELLOW (A) 06/14/2021 1830   APPEARANCEUR CLEAR (A) 06/14/2021 1830   LABSPEC 1.011 06/14/2021 1830   PHURINE 5.0 06/14/2021 1830   GLUCOSEU NEGATIVE 06/14/2021 1830   HGBUR NEGATIVE 06/14/2021 1830   BILIRUBINUR NEGATIVE 06/14/2021 1830   KETONESUR NEGATIVE 06/14/2021 1830   PROTEINUR 100 (A) 06/14/2021 1830   NITRITE NEGATIVE 06/14/2021 1830   LEUKOCYTESUR LARGE (A) 06/14/2021 1830   Dr. Sedalia Muta [facility]  If 7PM-7AM, please contact overnight-coverage provider If 7AM-7PM, please contact day coverage provider [email or web address]  11/20/2021, 8:11 PM

## 2021-11-21 ENCOUNTER — Observation Stay (HOSPITAL_COMMUNITY)
Admit: 2021-11-21 | Discharge: 2021-11-21 | Disposition: A | Discharge status: Home / Self Care | Payer: Medicare Other | Attending: Internal Medicine | Admitting: Internal Medicine

## 2021-11-21 DIAGNOSIS — I429 Cardiomyopathy, unspecified: Secondary | ICD-10-CM | POA: Diagnosis present

## 2021-11-21 DIAGNOSIS — Z79899 Other long term (current) drug therapy: Secondary | ICD-10-CM | POA: Diagnosis not present

## 2021-11-21 DIAGNOSIS — E785 Hyperlipidemia, unspecified: Secondary | ICD-10-CM | POA: Diagnosis present

## 2021-11-21 DIAGNOSIS — R0609 Other forms of dyspnea: Secondary | ICD-10-CM | POA: Diagnosis not present

## 2021-11-21 DIAGNOSIS — R079 Chest pain, unspecified: Secondary | ICD-10-CM | POA: Diagnosis not present

## 2021-11-21 DIAGNOSIS — Z6841 Body Mass Index (BMI) 40.0 and over, adult: Secondary | ICD-10-CM | POA: Diagnosis not present

## 2021-11-21 DIAGNOSIS — N1831 Chronic kidney disease, stage 3a: Secondary | ICD-10-CM | POA: Diagnosis not present

## 2021-11-21 DIAGNOSIS — Z7982 Long term (current) use of aspirin: Secondary | ICD-10-CM | POA: Diagnosis not present

## 2021-11-21 DIAGNOSIS — I272 Pulmonary hypertension, unspecified: Secondary | ICD-10-CM | POA: Diagnosis present

## 2021-11-21 DIAGNOSIS — I959 Hypotension, unspecified: Secondary | ICD-10-CM | POA: Diagnosis not present

## 2021-11-21 DIAGNOSIS — T82594A Other mechanical complication of infusion catheter, initial encounter: Secondary | ICD-10-CM | POA: Diagnosis not present

## 2021-11-21 DIAGNOSIS — E876 Hypokalemia: Secondary | ICD-10-CM | POA: Diagnosis present

## 2021-11-21 DIAGNOSIS — I5033 Acute on chronic diastolic (congestive) heart failure: Secondary | ICD-10-CM

## 2021-11-21 DIAGNOSIS — I509 Heart failure, unspecified: Secondary | ICD-10-CM | POA: Diagnosis present

## 2021-11-21 DIAGNOSIS — Z87891 Personal history of nicotine dependence: Secondary | ICD-10-CM | POA: Diagnosis not present

## 2021-11-21 DIAGNOSIS — I248 Other forms of acute ischemic heart disease: Secondary | ICD-10-CM | POA: Diagnosis present

## 2021-11-21 DIAGNOSIS — E662 Morbid (severe) obesity with alveolar hypoventilation: Secondary | ICD-10-CM | POA: Diagnosis present

## 2021-11-21 DIAGNOSIS — K219 Gastro-esophageal reflux disease without esophagitis: Secondary | ICD-10-CM | POA: Diagnosis present

## 2021-11-21 DIAGNOSIS — I5043 Acute on chronic combined systolic (congestive) and diastolic (congestive) heart failure: Secondary | ICD-10-CM | POA: Diagnosis present

## 2021-11-21 DIAGNOSIS — J9691 Respiratory failure, unspecified with hypoxia: Secondary | ICD-10-CM | POA: Diagnosis not present

## 2021-11-21 DIAGNOSIS — E872 Acidosis, unspecified: Secondary | ICD-10-CM | POA: Diagnosis present

## 2021-11-21 DIAGNOSIS — I130 Hypertensive heart and chronic kidney disease with heart failure and stage 1 through stage 4 chronic kidney disease, or unspecified chronic kidney disease: Secondary | ICD-10-CM | POA: Diagnosis present

## 2021-11-21 DIAGNOSIS — Z7951 Long term (current) use of inhaled steroids: Secondary | ICD-10-CM | POA: Diagnosis not present

## 2021-11-21 DIAGNOSIS — J9601 Acute respiratory failure with hypoxia: Secondary | ICD-10-CM | POA: Diagnosis present

## 2021-11-21 DIAGNOSIS — G40209 Localization-related (focal) (partial) symptomatic epilepsy and epileptic syndromes with complex partial seizures, not intractable, without status epilepticus: Secondary | ICD-10-CM | POA: Diagnosis present

## 2021-11-21 DIAGNOSIS — Z881 Allergy status to other antibiotic agents status: Secondary | ICD-10-CM | POA: Diagnosis not present

## 2021-11-21 DIAGNOSIS — I161 Hypertensive emergency: Secondary | ICD-10-CM | POA: Diagnosis present

## 2021-11-21 DIAGNOSIS — I10 Essential (primary) hypertension: Secondary | ICD-10-CM | POA: Diagnosis not present

## 2021-11-21 DIAGNOSIS — N179 Acute kidney failure, unspecified: Secondary | ICD-10-CM | POA: Diagnosis present

## 2021-11-21 DIAGNOSIS — N1832 Chronic kidney disease, stage 3b: Secondary | ICD-10-CM | POA: Diagnosis present

## 2021-11-21 DIAGNOSIS — Z8249 Family history of ischemic heart disease and other diseases of the circulatory system: Secondary | ICD-10-CM | POA: Diagnosis not present

## 2021-11-21 DIAGNOSIS — R0789 Other chest pain: Secondary | ICD-10-CM | POA: Diagnosis not present

## 2021-11-21 DIAGNOSIS — E8729 Other acidosis: Secondary | ICD-10-CM | POA: Diagnosis not present

## 2021-11-21 LAB — BASIC METABOLIC PANEL
Anion gap: 6 (ref 5–15)
BUN: 23 mg/dL — ABNORMAL HIGH (ref 6–20)
CO2: 30 mmol/L (ref 22–32)
Calcium: 9 mg/dL (ref 8.9–10.3)
Chloride: 104 mmol/L (ref 98–111)
Creatinine, Ser: 1.57 mg/dL — ABNORMAL HIGH (ref 0.44–1.00)
GFR, Estimated: 39 mL/min — ABNORMAL LOW (ref >60)
Glucose, Bld: 94 mg/dL (ref 70–99)
Potassium: 4.3 mmol/L (ref 3.5–5.1)
Sodium: 140 mmol/L (ref 135–145)

## 2021-11-21 LAB — ECHOCARDIOGRAM COMPLETE
Height: 66 in
S' Lateral: 3.7 cm
Weight: 4649.6 oz

## 2021-11-21 LAB — CBC
HCT: 38.9 % (ref 36.0–46.0)
Hemoglobin: 11.9 g/dL — ABNORMAL LOW (ref 12.0–15.0)
MCH: 26.5 pg (ref 26.0–34.0)
MCHC: 30.6 g/dL (ref 30.0–36.0)
MCV: 86.6 fL (ref 80.0–100.0)
Platelets: 247 10*3/uL (ref 150–400)
RBC: 4.49 MIL/uL (ref 3.87–5.11)
RDW: 16.1 % — ABNORMAL HIGH (ref 11.5–15.5)
WBC: 5.6 10*3/uL (ref 4.0–10.5)
nRBC: 0 % (ref 0.0–0.2)

## 2021-11-21 LAB — TROPONIN I (HIGH SENSITIVITY): Troponin I (High Sensitivity): 28 ng/L — ABNORMAL HIGH (ref <18)

## 2021-11-21 MED ORDER — ENOXAPARIN SODIUM 80 MG/0.8ML IJ SOSY
0.5000 mg/kg | PREFILLED_SYRINGE | INTRAMUSCULAR | Status: DC
  Administered 2021-11-21 – 2021-12-05 (×15): 65 mg via SUBCUTANEOUS
  Filled 2021-11-21 (×15): qty 0.8

## 2021-11-21 MED ORDER — MORPHINE SULFATE (PF) 2 MG/ML IV SOLN
1.0000 mg | Freq: Once | INTRAVENOUS | Status: AC
  Administered 2021-11-21: 1 mg via INTRAVENOUS
  Filled 2021-11-21: qty 1

## 2021-11-21 MED ORDER — FUROSEMIDE 10 MG/ML IJ SOLN
40.0000 mg | Freq: Every day | INTRAMUSCULAR | Status: DC
  Administered 2021-11-21 – 2021-11-22 (×2): 40 mg via INTRAVENOUS
  Filled 2021-11-21 (×2): qty 4

## 2021-11-21 NOTE — Consult Note (Signed)
   Heart Failure Nurse Navigator Note  HFpEF 55-60% by previous echocardiogram.  Echocardiogram results pending on this admission.  She presented to the emergency room with complaints of shortness of breath, chest pain, dyspnea on exertion and lower extremity edema.  BNP 580.  Chest x-ray showed cardiomegaly and vascular congestion.  Comorbidities:  Morbid obesity Hypertension Hyperlipidemia Chronic kidney disease stage IIIb  Medications:  Aspirin 81 mg daily Atorvastatin 40 mg daily Carvedilol 25 mg 2 times a day with meals Furosemide 40 mg IV daily Irbesartan 75 mg daily Isosorbide mononitrate 30 mg daily  Labs:  Sodium 140, potassium 4.2, chloride 103, CO2 28, BUN 18, creatinine 1.36. Intake 240 mL Output 1500 mL Weight 131.8 kg Blood pressure 130/73  Initial meeting with patient.  She was lying in bed in no acute distress.  Discussed daily weights, she states that she does weigh herself daily and over the last month felt that her weight had increased 10 pounds.  Went over weight increases and changes in symptoms to report to the outpatient heart failure clinic.  She does not use salt, tries to maintain a low-sodium diet.  She states that she does have a working blood pressure cuff and she checks her blood pressure twice a day.  In admission her blood pressure was 202/90. Discussed fluid restriction of 64 ounces.  Patient admits to drinking 5 to 6-16 ounce bottles, in addition to juice.  Explained to the patient that 4 of those bottles make up her total fluid restriction for a 24-hour.  She voices understanding.  She felt that she had been compliant with her medications.  Discussed her follow-up in the outpatient heart failure clinic for which she has an appointment on July 11 at 2 PM.  She has a 15% no-show which is 2 out of 13 appointments.  She had no further questions.  Tresa Endo RN [facility]

## 2021-11-21 NOTE — Progress Notes (Signed)
Progress Note   Patient: Sheila Hays EPP:[id number] DOB: 10-Jan-1971 DOA: 11/20/2021     0 DOS: the patient was seen and examined on 11/21/2021   Brief hospital course: Artrice Kraker is a 51 year old female with morbid obesity, hypertension, hyperlipidemia, chronic HFpEF, CKD 3B, who presented to the ED on 11/20/2021 for evaluation of shortness of breath and chest pain.  Initial blood pressure 202/90 in the ED with otherwise normal vitals, SPO2 of 95% on room air.  Labs were mostly unremarkable but BNP elevated 580.8.  Chest x-ray with cardiomegaly, prominent central pulmonary vessels concerning for CHF and sepsis small/minimal bilateral pleural effusions  Blood pressure was treated with Vasotec 0.625 mg IV in the ED, also given furosemide 40 mg IV.  Admitted to the hospital for further evaluation management of acute on chronic HFpEF requiring IV diuresis, close monitoring and management of hypertensive urgency.  Assessment and Plan: * Acute on chronic heart failure with preserved ejection fraction (HFpEF) (HCC) Presented with shortness of breath and chest pain, orthopnea.  Started on IV Lasix with good response. -- Cardiology consulted -- Follow-up pending echo --Monitor renal function electrolytes -- Strict I/O's and daily weights   Chest pain Likely related to decompensated CHF.  Troponin very minimally elevated with flat trend and EKG is nonacute. Most likely demand ischemia. Not consistent with ACS  Morbid obesity (HCC) Body mass index is 46.9 kg/m. Complicates overall care and prognosis.  Recommend lifestyle modifications including physical activity and diet for weight loss and overall long-term health.   Complex partial seizure disorder (HCC) - Resumed ox carbamazepine 600 mg p.o. twice daily; Keppra 500 mg p.o. twice daily - Per med reconciliation, patient is supposed to be taking Lamictal 25 mg tablet, 4 pills twice daily however on admission patient reported she is  taking it 25 mg p.o. twice daily --Resumed on Lamictal at 25 mg p.o. twice daily because lamictal requires gradual increase dosing -Follow-up with primary care and/or neurology for further recommendations regarding Lamictal dosing - Ativan 2 mg IV as needed for seizure, 2 doses ordered  Chronic kidney disease, stage 3a (HCC) Renal function near baseline.  Monitor renal function closely with diuresis  Essential hypertension --Continue isosorbide mononitrate 30 mg daily, irbesartan 75 mg daily, carvedilol 25 mg p.o. twice daily - Hydralazine 10 mg IV every 6 hours as needed  NSTEMI (non-ST elevated myocardial infarction) (HCC) ACS ruled out.  Troponin elevation likely due to type II NSTEMI or demand ischemia.  No chest pain or acute changes on EKG Management as outlined and per cardiology        Subjective: Patient was sleeping when seen on rounds but did wake easily to voice.  She reported wanting to rest.  Said she has heard more than enough about her heart today.  Does not want to participate in much conversation during my encounter.  She reports ongoing chest pain but no shortness of breath dizziness lightheadedness or palpitations.  She says her leg swelling has improved and she has had good urine output with Lasix.  No other acute complaints at this time.  Physical Exam: Vitals:   11/21/21 0844 11/21/21 0957 11/21/21 1135 11/21/21 1511  BP: (!) 173/90 (!) 149/79 130/73 135/77  Pulse: 72  63 62  Resp: 20  16   Temp:   98.2 F (36.8 C) 98.8 F (37.1 C)  TempSrc:   Oral Oral  SpO2: 95%  97% 98%  Weight:      Height:  General exam: Resting comfortably, woke easily to voice no acute distress, morbidly obese, minimally conversant HEENT: atraumatic, clear conjunctiva, anicteric sclera, moist mucus membranes, hearing grossly normal  Respiratory system: CTAB diminished due to body habitus, normal respiratory effort. Cardiovascular system: normal S1/S2, RRR, unable to  visualize JVD due to body habitus, murmurs, rubs, gallops, trace to 1+ bilateral lower extremity Central nervous system: A&O x3. no gross focal neurologic deficits, normal speech Extremities: moves all, normal tone Skin: dry, intact, normal temperature Psychiatry: normal mood, flat affect, judgement and insight appear normal   Data Reviewed:  Notable labs: BUN 23, creatinine 1.57, GFR 39, troponin 28, hemoglobin stable 11.9  Echocardiogram --- EF 55 to 60%, mild LVH, diastolic parameters cannot be evaluated, moderately dilated left atrium  Family Communication: None  Disposition: Status is: Inpatient Remains inpatient appropriate because: Remains on IV Lasix for further diuresis   Planned Discharge Destination: Home    Time spent: 35 minutes  Author: Pennie Banter, DO 11/21/2021 6:05 PM  For on call review [email or web address].

## 2021-11-21 NOTE — Progress Notes (Signed)
Notified on call provider in regards to BP 225/128. Patient asymptomatic, and RN re-checked VS later and BP 184/86. No new orders at this time.

## 2021-11-21 NOTE — Progress Notes (Signed)
Upon arrival to room Sheila Hays was sitting in bed with oxygen support. She reports the oxygen is helpful yet does not have any at home. Her prayer request was for her stress. She is aware her living situation maybe causing her breathing struggles due to possible mold. The lease is up in October yet she is struggling to find an affordable location. She has found doctors in the area whom she trust and aid in transportation. She is supported by a friend who is helpful. Family is out of town which she misses. Chaplain offered listening presence, built rapport, and offered prayer for Sheila Hays's stress.     11/21/21 1700  Clinical Encounter Type  Visited With Patient  Visit Type Initial  Referral From Patient  Spiritual Encounters  Spiritual Needs Prayer

## 2021-11-21 NOTE — Progress Notes (Signed)
   11/21/21 1400  Clinical Encounter Type  Visited With Patient not available   Chaplain Cella Cappello attempted to visit Sheila Hays as requested. Sheila Hays sleeping at this time.  Will refer for f/u to on-call chaplain this evening.

## 2021-11-21 NOTE — Assessment & Plan Note (Addendum)
Atypical, reproducible on palpation, likely musculoskeletal etiology vs esophageal etiology.  Troponin very minimally elevated with flat trend and EKG is nonacute. Most likely demand ischemia per cardiology. Not consistent with ACS. 7/1 - recurrent bout this AM, trop down to 18 from 28 and EKG remains normal. --Cardiology following --Nitro paste discontinued -- Ischemic evaluation as outpatient, outpatient coronary CTA vs PET-CT could be considered per cardiology

## 2021-11-21 NOTE — Consult Note (Signed)
Cardiology Consultation:   Patient ID: Sheila Hays; [id number]; Feb 23, 1971   Admit date: 11/20/2021 Date of Consult: 11/21/2021  Primary Care Provider: [place] Primary Cardiologist: new to [facility] - consult by [place] (previously seen inpatient a  in [place]) Primary Electrophysiologist:  None   Patient Profile:   Sheila Hays is a 51 y.o. female with a hx of HFimpEF, CKD stage III, seizure disorder, uncontrolled HTN, and GERD who is being seen today for the evaluation of volume overload at the request of Dr. Arbutus Ped.  History of Present Illness:   Sheila Hays previously underwent nuclear stress test in 2016 that was abnormal, notable for perfusion defect of the lateral inferior segment with an EF of 35%. Echo at that time demonstrated an EF of 25-30%, mildly to moderately dilated LV cavity size, mild to moderate concentric LVH, mild to moderate MR, mild TR, PASP 55-60 mmHg, and a trivial pericardial effusion. Repeat echo in 2018 demonstrated a low normal LVSF with an EF of 50-55%, normal wall motion.   We previously evaluated her at [facility] in [place] for chest pain following transfer from [facility]. Nuclear stress test at that time showed a small defect of mild severity in the basal anteroseptal and mid anteroseptal location consistent with ischemia vs breast attenuation artifact with an EF of 45-54%. Overall, this was a low risk study.   Echo during admission in 11/2020 for volume overload in the setting of poorly controlled hypertension showed an EF of 55-60%, no RWMA, normal LV diastolic function parameters, normal RV systolic function and ventricular cavity size, and trivial mitral regurgitation.  She presented to [facility] on 6/28 with progressive dyspnea, lower extremity swelling, orthopnea and chest pain.  She reports adherence to her medications and denies any changes to her diet. In the setting of the above, she notes  orthopnea. Upon presentation, she was hypertensive with BP in the 123456 systolic. High sensitivity troponin 23 with a delta troponin of 21, peaking at 28 BNP 580, improved from value of 1117 last year. HGB 11.7. BUN/SCr 18/1.36 trending to 23/1.57. CXR with cardiomegaly with prominent central pulmonary vessels suggestive of volume overload with possible minimal bilateral pleural effusions. She received IV Lasix 40 mg in the ED, which was continued upon admission. BP has improved to the [id number] systolic at time of cardiology consult with current regimen. Currently, notes some improvement in her dyspnea, though not back to baseline. Documented UOP of 1.2 L for the admission to date.    Past Medical History:  Diagnosis Date   CHF (congestive heart failure) (HCC)    Chronic kidney disease (CKD), stage III (moderate) (HCC)    GERD (gastroesophageal reflux disease)    Hypertension    Seizures ([place])    Followed at [facility]    History reviewed. No pertinent surgical history.   Home Meds: Prior to Admission medications   Medication Sig Start Date End Date Taking? Authorizing Provider  ADVAIR DISKUS 250-50 MCG/ACT AEPB Inhale 1 puff into the lungs 2 (two) times daily. 11/18/21  Yes [provider]  albuterol (VENTOLIN HFA) 108 (90 Base) MCG/ACT inhaler Inhale 2 puffs into the lungs every 4 (four) hours as needed. 10/16/20  Yes [provider]  aspirin EC 81 MG tablet Take 81 mg by mouth daily.   Yes [provider]  atorvastatin (LIPITOR) 40 MG tablet Take 1 tablet (40 mg total) by mouth daily at 6 PM. 03/06/18  Yes Cheryln Manly,  NP  butalbital-acetaminophen-caffeine (FIORICET) 50-325-40 MG tablet Take 1 tablet by mouth 2 (two) times daily as needed for headache. 12/03/20  Yes Darlin Priestly, MD  carvedilol (COREG) 25 MG tablet Take 1 tablet (25 mg total) by mouth 2 (two) times daily with a meal. 03/06/18  Yes Laverda Page B, NP  escitalopram (LEXAPRO) 20 MG tablet Take 20 mg by  mouth every morning. 11/07/21  Yes [provider]  folic acid (FOLVITE) 400 MCG tablet Take 400 mcg by mouth daily.   Yes [provider]  furosemide (LASIX) 40 MG tablet Take 40 mg by mouth daily. 10/08/21  Yes [provider]  indomethacin (INDOCIN) 25 MG capsule Take 25 mg by mouth 3 (three) times daily as needed. 10/30/21  Yes [provider]  ipratropium-albuterol (DUONEB) 0.5-2.5 (3) MG/3ML SOLN Take 3 mLs by nebulization every 6 (six) hours as needed. 11/20/21  Yes [provider]  irbesartan (AVAPRO) 75 MG tablet Take 75 mg by mouth daily. 10/08/21  Yes [provider]  isosorbide mononitrate (IMDUR) 30 MG 24 hr tablet Take 30 mg by mouth daily. 10/19/20  Yes [provider]  lamoTRIgine (LAMICTAL) 25 MG tablet Take 25 mg by mouth 2 (two) times daily. 10/08/21  Yes [provider]  levETIRAcetam (KEPPRA) 500 MG tablet Take 1 tablet (500 mg total) by mouth 2 (two) times daily. 12/03/20 11/21/21 Yes Darlin Priestly, MD  LINZESS 145 MCG CAPS capsule Take 145 mcg by mouth daily as needed. 10/08/21  Yes [provider]  loratadine (CLARITIN) 10 MG tablet Take 10 mg by mouth daily as needed. 10/08/21  Yes [provider]  montelukast (SINGULAIR) 10 MG tablet Take 10 mg by mouth daily. 11/06/21  Yes [provider]  nitroGLYCERIN (NITROSTAT) 0.4 MG SL tablet Place 1 tablet (0.4 mg total) under the tongue every 5 (five) minutes as needed. 12/03/20  Yes Darlin Priestly, MD  omeprazole (PRILOSEC) 20 MG capsule Take 20 mg by mouth daily.   Yes [provider]  oxcarbazepine (TRILEPTAL) 600 MG tablet Take 600 mg by mouth 2 (two) times daily.   Yes [provider]  Potassium Chloride ER 20 MEQ TBCR Take 1 tablet by mouth daily. 10/19/20  Yes [provider]  SYMBICORT 160-4.5 MCG/ACT inhaler Inhale 2 puffs into the lungs in the morning and at bedtime. 10/12/20  Yes [provider]  traZODone  (DESYREL) 100 MG tablet Take 100 mg by mouth at bedtime as needed for sleep.   Yes [provider]  vitamin C (ASCORBIC ACID) 500 MG tablet Take 500 mg by mouth daily.   Yes [provider]  zinc gluconate 50 MG tablet Take 50 mg by mouth daily. 10/08/21  Yes [provider]  azithromycin (ZITHROMAX) 250 MG tablet Take one daily Patient not taking: Reported on 11/20/2021 08/29/21   Willy Eddy, MD  furosemide (LASIX) 20 MG tablet Take 60 mg by mouth daily. Patient not taking: Reported on 11/20/2021    [provider]  hydrALAZINE (APRESOLINE) 100 MG tablet Hold until followup with your outpatient doctor because you blood pressure was normal without this. Patient not taking: Reported on 12/18/2020 12/03/20   Darlin Priestly, MD    Inpatient Medications: Scheduled Meds:  vitamin C  500 mg Oral Daily   aspirin EC  81 mg Oral Daily   atorvastatin  40 mg Oral q1800   carvedilol  25 mg Oral BID WC   enoxaparin (LOVENOX) injection  0.5 mg/kg Subcutaneous Q24H  escitalopram  20 mg Oral q morning   folic acid  [id number] mcg Oral Daily   irbesartan  75 mg Oral Daily   isosorbide mononitrate  30 mg Oral Daily   lamoTRIgine  25 mg Oral BID   levETIRAcetam  500 mg Oral BID   mometasone-formoterol  2 puff Inhalation BID   oxcarbazepine  600 mg Oral BID   pantoprazole  40 mg Oral Daily   Continuous Infusions:  PRN Meds: acetaminophen **OR** acetaminophen, albuterol, butalbital-acetaminophen-caffeine, hydrALAZINE, linaclotide, LORazepam, nitroGLYCERIN, ondansetron **OR** ondansetron (ZOFRAN) IV, mouth rinse, senna-docusate, traZODone  Allergies:   Allergies  Allergen Reactions   Ciprofloxacin Itching    Social History:   Social History   Socioeconomic History   Marital status: Single    Spouse name: Not on file   Number of children: Not on file   Years of education: Not on file   Highest education level: Not on file  Occupational History   Not on file   Tobacco Use   Smoking status: Former   Smokeless tobacco: Former    Quit date: 03/06/2017  Substance and Sexual Activity   Alcohol use: Not Currently   Drug use: Never   Sexual activity: Not Currently  Other Topics Concern   Not on file  Social History Narrative   Not on file   Social Determinants of Health   Financial Resource Strain: Not on file  Food Insecurity: Not on file  Transportation Needs: Not on file  Physical Activity: Not on file  Stress: Not on file  Social Connections: Not on file  Intimate Partner Violence: Not on file     Family History:   Family History  Problem Relation Age of Onset   Hypertension Mother    Hypertension Father    Heart failure Sister    Diabetes Sister    Seizures Sister     ROS:  Review of Systems  Constitutional:  Positive for malaise/fatigue. Negative for chills, diaphoresis, fever and weight loss.  HENT:  Negative for congestion.   Eyes:  Negative for discharge and redness.  Respiratory:  Positive for cough. Negative for sputum production, shortness of breath and wheezing.   Cardiovascular:  Positive for chest pain, orthopnea and leg swelling. Negative for palpitations, claudication and PND.  Gastrointestinal:  Negative for abdominal pain, heartburn, nausea and vomiting.  Musculoskeletal:  Negative for falls and myalgias.  Skin:  Negative for rash.  Neurological:  Negative for dizziness, tingling, tremors, sensory change, speech change, focal weakness, loss of consciousness and weakness.  Endo/Heme/Allergies:  Does not bruise/bleed easily.  Psychiatric/Behavioral:  Negative for substance abuse. The patient is not nervous/anxious.   All other systems reviewed and are negative.     Physical Exam/Data:   Vitals:   11/21/21 0733 11/21/21 0844 11/21/21 0957 11/21/21 1135  BP: 140/63 (!) 173/90 (!) 149/79 130/73  Pulse: 73 72  63  Resp: 20 20  16   Temp: 99.1 F (37.3 C)   98.2 F (36.8 C)  TempSrc: Oral   Oral  SpO2:  100% 95%  97%  Weight:      Height:        Intake/Output Summary (Last 24 hours) at 11/21/2021 1138 Last data filed at 11/21/2021 0500 Gross per 24 hour  Intake 240 ml  Output 1500 ml  Net -1260 ml   Filed Weights   11/20/21 1350 11/21/21 0325  Weight: 99.8 kg 131.8 kg   Body mass index is 46.9 kg/m.   Physical Exam: General:  Well developed, well nourished, in no acute distress. Head: Normocephalic, atraumatic, sclera non-icteric, no xanthomas, nares without discharge.  Neck: Negative for carotid bruits. JVD difficult to assess secondary to body habitus. Lungs: Diminished breath sounds bilaterally. Breathing is unlabored. Heart: RRR with S1 S2. No murmurs, rubs, or gallops appreciated. Abdomen: Soft, non-tender, non-distended with normoactive bowel sounds. No hepatomegaly. No rebound/guarding. No obvious abdominal masses. Msk:  Strength and tone appear normal for age. Extremities: No clubbing or cyanosis. Mild bilateral lower extremity edema. Distal pedal pulses are 2+ and equal bilaterally. Neuro: Alert and oriented X 3. No facial asymmetry. No focal deficit. Moves all extremities spontaneously. Psych:  Responds to questions appropriately with a normal affect.   EKG:  The EKG was personally reviewed and demonstrates: NSR, 66 bpm, left axis deviation poor R wave progression along the precordial leads, possible prior anterior infarct Telemetry:  Telemetry was personally reviewed and demonstrates: SR  Weights: Filed Weights   11/20/21 1350 11/21/21 0325  Weight: 99.8 kg 131.8 kg    Relevant CV Studies:  Nuclear stress test 05/01/2015 ([facility]): IMPRESSION-  1.  MYOCARDIAL PERFUSION STUDY IS ABNORMAL AND SUGGESTIVE OF  MYOCARDIAL ISCHEMIA.   2.  THERE IS A PORTON OF THE BASE TO MID PORTION OF THE LATERAL  INFERIOR SEGMENT OF MODERATE SEVERE COUNTS THAT DID SEEM IMPROVED WITH  REST.   3.  GATED IMAGES CALCULATED AN EJECTION FRACTION OF 35%.   ___________  2D echo 05/02/2015 ([facility]): CONCLUSIONS-  The study quality is technically difficult.  The study is technically limited due to poor acoustic windows.  The left ventricular size is mild to moderately dilated.  Mild to moderate concentric left ventricular hypertrophy is observed.  There is severely decreased left ventricular systolic function.  The estimated ejection fraction is 25-30%.  The left ventricular diastolic filling pattern is restrictive.  The left ventricular diastolic filling pattern is consistent with  elevated mean left atrial pressure.  There is mild to moderate mitral regurgitation.  There is mild tricuspid regurgitation.  There is mild to moderate pulmonic regurgitation.  There is evidence of moderate pulmonary hypertension.  The right ventricular systolic pressure is estimated to be 55-60 mmHg.  A trivial pericardial effusion is visualized.  There is no patent foramen ovale visualized.  A patent foramen ovale is not demonstrated with color doppler and  agitated saline contrast.  __________  2D echo 04/01/2017 ([facility]): Conclusions  Moderate concentric left ventricular hypertrophy  Normal left ventricular size and systolic function with no appreciable  segmental abnormality.  Ejection fraction is visually estimated at 50-55%  Normal right ventricle structure and function.  __________  Eugenie Birks [facility] 03/06/2018: There was no ST segment deviation noted during stress. No T wave inversion was noted during stress. Defect 1: There is a small defect of mild severity present in the basal anteroseptal and mid anteroseptal location. Findings consistent with ischemia. This is a low risk study. The left ventricular ejection fraction is mildly decreased (45-54%). Anteroseptal perfusion defect does not correlate with the inferior wall motion abnormality. The anteroseptal defect may be either ischemia or breast attenuation  artifact. __________  2D echo 11/15/2019 ([facility]): Conclusions:                The study quality is technically difficult.  The study is technically limited due to poor acoustic windows.  The left ventricular size is mild to moderately dilated.  Mild to moderate concentric left  ventricular hypertrophy is observed.  Global left ventricular wall motion and contractility are within normal  limits.  The estimated ejection fraction is 55-60%.  The left ventricular diastolic filling pattern is consistent with  pseudonormalization.  The left ventricular diastolic filling pattern is consistent with both  elevated mean left atrial pressure and elevated left ventricular  end-diastolic pressure.  There is mild to moderate mitral regurgitation.  There is mild tricuspid regurgitation.  There is mild to moderate pulmonic regurgitation.  There is evidence that pulmonary hypertension may be underestimated.  The right ventricular systolic pressure is estimated to be 5-10 mmHg.  A trivial pericardial effusion is visualized.  There is no patent foramen ovale visualized.  A patent foramen ovale is not demonstrated by color Doppler.  There is no dilatation of the aortic root.  There is no dilatation of the ascending aorta.  __________  2D echo 11/30/2020: 1. Left ventricular ejection fraction, by estimation, is 55 to 60%. The  left ventricle has normal function. The left ventricle has no regional  wall motion abnormalities. Left ventricular diastolic parameters were  normal.   2. Right ventricular systolic function is normal. The right ventricular  size is normal.   3. The mitral valve is normal in structure. Trivial mitral valve  regurgitation.   4. The aortic valve is normal in structure. Aortic valve regurgitation is  not visualized.  Laboratory Data:  Chemistry Recent Labs  Lab 11/20/21 1352 11/21/21 0518  NA 140 140  K 4.0 4.3  CL 103 104  CO2 29 30  GLUCOSE  86 94  BUN 18 23*  CREATININE 1.36* 1.57*  CALCIUM 9.1 9.0  GFRNONAA 47* 39*  ANIONGAP 8 6    No results for input(s): "PROT", "ALBUMIN", "AST", "ALT", "ALKPHOS", "BILITOT" in the last 168 hours. Hematology Recent Labs  Lab 11/20/21 1352 11/21/21 0518  WBC 4.3 5.6  RBC 4.38 4.49  HGB 11.7* 11.9*  HCT 38.5 38.9  MCV 87.9 86.6  MCH 26.7 26.5  MCHC 30.4 30.6  RDW 16.6* 16.1*  PLT 222 247   Cardiac EnzymesNo results for input(s): "TROPONINI" in the last 168 hours. No results for input(s): "TROPIPOC" in the last 168 hours.  BNP Recent Labs  Lab 11/20/21 1352  BNP 580.8*    DDimer No results for input(s): "DDIMER" in the last 168 hours.  Radiology/Studies:  DG Chest 2 View  Result Date: 11/20/2021 IMPRESSION: Cardiomegaly. Central pulmonary vessels are prominent suggesting CHF. Possible minimal bilateral pleural effusions. Electronically Signed   By: Elmer Picker M.D.   On: 11/20/2021 14:26    Assessment and Plan:   1. HFimpEF: -Prior imaging in 2016 demonstrated an EF of 25-30% by echo, which subsequently improved by echo in 2018 with most recent echo in 11/2020 showing an EF of 55-60% -IV Lasix held with worsening renal function this morning  -Echo pending -Coreg, irbesartan -Escalate GDMT as indicated   2. Elevated high sensitivity troponin: -Mildly elevated and flat trending, not consistent with ACS -Likely supply demand ischemia in the setting of poorly controlled HTN and CKD -No indication for heparin gtt at this time -Echo -Will benefit from ischemic evaluation, consider outpatient coronary CTA -ASA, Lipitor, Imdur   3. Hypertensive heart disease: -She reports adherence to her medications and denies missing any doses with BP typically in the 130s at home -Blood pressure > 200 mmHg upon presentation  -Improved to the [id number] systolic  -Would transition from nitro paste to Imdur -Continue current therapy   4.  Acute on CKD stage III: -Avoid nephrotoxic  agents      For questions or updates, please contact [place] Please consult [email or web address] for contact info under Cardiology/STEMI.   Signed, Christell Faith, PA-C [facility] Pager: [phone] 11/21/2021, 11:38 AM

## 2021-11-21 NOTE — Progress Notes (Signed)
*  PRELIMINARY RESULTS* Echocardiogram 2D Echocardiogram has been performed.  [name] 11/21/2021, 9:49 AM

## 2021-11-22 DIAGNOSIS — I5033 Acute on chronic diastolic (congestive) heart failure: Secondary | ICD-10-CM | POA: Diagnosis not present

## 2021-11-22 DIAGNOSIS — R079 Chest pain, unspecified: Secondary | ICD-10-CM | POA: Diagnosis not present

## 2021-11-22 LAB — BASIC METABOLIC PANEL
Anion gap: 8 (ref 5–15)
BUN: 30 mg/dL — ABNORMAL HIGH (ref 6–20)
CO2: 33 mmol/L — ABNORMAL HIGH (ref 22–32)
Calcium: 8.7 mg/dL — ABNORMAL LOW (ref 8.9–10.3)
Chloride: 102 mmol/L (ref 98–111)
Creatinine, Ser: 1.8 mg/dL — ABNORMAL HIGH (ref 0.44–1.00)
GFR, Estimated: 33 mL/min — ABNORMAL LOW (ref >60)
Glucose, Bld: 72 mg/dL (ref 70–99)
Potassium: 4 mmol/L (ref 3.5–5.1)
Sodium: 143 mmol/L (ref 135–145)

## 2021-11-22 LAB — MAGNESIUM: Magnesium: 2.1 mg/dL (ref 1.7–2.4)

## 2021-11-22 NOTE — Progress Notes (Signed)
Progress Note  Patient Name: Sheila Hays Date of Encounter: 11/22/2021  Primary Cardiologist: New to [facility] - consult by Agbor-Etang (previously seen inpatient a [facility] in [place])  Subjective   Continues to note chest discomfort that is worse with deep inspiration and reproducible to her palpation.  Dyspnea and lower extremity swelling are improving.  Documented urine output 660 mL for the past 24 hours and net -1.9 L for the admission.  Weight trend 131.8 to 129.2 kg.  Renal function slightly worse today.  Inpatient Medications    Scheduled Meds:  vitamin C  500 mg Oral Daily   aspirin EC  81 mg Oral Daily   atorvastatin  40 mg Oral q1800   carvedilol  25 mg Oral BID WC   enoxaparin (LOVENOX) injection  0.5 mg/kg Subcutaneous Q24H   escitalopram  20 mg Oral q morning   folic acid  [id number] mcg Oral Daily   irbesartan  75 mg Oral Daily   isosorbide mononitrate  30 mg Oral Daily   lamoTRIgine  25 mg Oral BID   levETIRAcetam  500 mg Oral BID   mometasone-formoterol  2 puff Inhalation BID   oxcarbazepine  600 mg Oral BID   pantoprazole  40 mg Oral Daily   Continuous Infusions:  PRN Meds: acetaminophen **OR** acetaminophen, albuterol, butalbital-acetaminophen-caffeine, hydrALAZINE, linaclotide, LORazepam, nitroGLYCERIN, ondansetron **OR** ondansetron (ZOFRAN) IV, mouth rinse, senna-docusate, traZODone   Vital Signs    Vitals:   11/22/21 0334 11/22/21 0335 11/22/21 0823 11/22/21 1026  BP: (!) 123/58  132/60 124/69  Pulse: 65  71 68  Resp: 20  18   Temp: 98.6 F (37 C)  98.3 F (36.8 C)   TempSrc: Oral  Oral   SpO2: 95%  95%   Weight:  129.2 kg    Height:        Intake/Output Summary (Last 24 hours) at 11/22/2021 1138 Last data filed at 11/22/2021 0500 Gross per 24 hour  Intake 240 ml  Output 900 ml  Net -660 ml   Filed Weights   11/20/21 1350 11/21/21 0325 11/22/21 0335  Weight: 99.8 kg 131.8 kg 129.2 kg    Telemetry    Sinus rhythm with an 11  beat run of SVT - Personally Reviewed  ECG    No new tracings - Personally Reviewed  Physical Exam   GEN: No acute distress.   Neck: JVD difficult to assess secondary to body habitus. Cardiac: RRR, no murmurs, rubs, or gallops.  Palpation of the anterior chest wall reproduces her chest discomfort. Respiratory: Diminished breath sounds along the bases bilaterally.  GI: Soft, nontender, non-distended.   MS: Trivial bilateral lower extremity pretibial edema; No deformity. Neuro:  Alert and oriented x 3; Nonfocal.  Psych: Normal affect.  Labs    Chemistry Recent Labs  Lab 11/20/21 1352 11/21/21 0518 11/22/21 0551  NA 140 140 143  K 4.0 4.3 4.0  CL 103 104 102  CO2 29 30 33*  GLUCOSE 86 94 72  BUN 18 23* 30*  CREATININE 1.36* 1.57* 1.80*  CALCIUM 9.1 9.0 8.7*  GFRNONAA 47* 39* 33*  ANIONGAP 8 6 8      Hematology Recent Labs  Lab 11/20/21 1352 11/21/21 0518  WBC 4.3 5.6  RBC 4.38 4.49  HGB 11.7* 11.9*  HCT 38.5 38.9  MCV 87.9 86.6  MCH 26.7 26.5  MCHC 30.4 30.6  RDW 16.6* 16.1*  PLT 222 247    Cardiac EnzymesNo results for input(s): "TROPONINI" in  the last 168 hours. No results for input(s): "TROPIPOC" in the last 168 hours.   BNP Recent Labs  Lab 11/20/21 1352  BNP 580.8*     DDimer No results for input(s): "DDIMER" in the last 168 hours.   Radiology    DG Chest 2 View  Result Date: 11/20/2021 IMPRESSION: Cardiomegaly. Central pulmonary vessels are prominent suggesting CHF. Possible minimal bilateral pleural effusions. Electronically Signed   By: Elmer Picker M.D.   On: 11/20/2021 14:26    Cardiac Studies   Nuclear stress test 05/01/2015 ([place]): IMPRESSION-  1.  MYOCARDIAL PERFUSION STUDY IS ABNORMAL AND SUGGESTIVE OF  MYOCARDIAL ISCHEMIA.   2.  THERE IS A PORTON OF THE BASE TO MID PORTION OF THE LATERAL  INFERIOR SEGMENT OF MODERATE SEVERE COUNTS THAT DID SEEM IMPROVED WITH  REST.   3.  GATED IMAGES CALCULATED  AN EJECTION FRACTION OF 35%.  ___________   2D echo 05/02/2015 ([place]): CONCLUSIONS-  The study quality is technically difficult.  The study is technically limited due to poor acoustic windows.  The left ventricular size is mild to moderately dilated.  Mild to moderate concentric left ventricular hypertrophy is observed.  There is severely decreased left ventricular systolic function.  The estimated ejection fraction is 25-30%.  The left ventricular diastolic filling pattern is restrictive.  The left ventricular diastolic filling pattern is consistent with  elevated mean left atrial pressure.  There is mild to moderate mitral regurgitation.  There is mild tricuspid regurgitation.  There is mild to moderate pulmonic regurgitation.  There is evidence of moderate pulmonary hypertension.  The right ventricular systolic pressure is estimated to be 55-60 mmHg.  A trivial pericardial effusion is visualized.  There is no patent foramen ovale visualized.  A patent foramen ovale is not demonstrated with color doppler and  agitated saline contrast.  __________   2D echo 04/01/2017 ([facility]): Conclusions  Moderate concentric left ventricular hypertrophy  Normal left ventricular size and systolic function with no appreciable  segmental abnormality.  Ejection fraction is visually estimated at 50-55%  Normal right ventricle structure and function.  __________   [facility] 03/06/2018: There was no ST segment deviation noted during stress. No T wave inversion was noted during stress. Defect 1: There is a small defect of mild severity present in the basal anteroseptal and mid anteroseptal location. Findings consistent with ischemia. This is a low risk study. The left ventricular ejection fraction is mildly decreased (45-54%). Anteroseptal perfusion defect does not correlate with the inferior wall motion abnormality. The anteroseptal defect may be either ischemia or  breast attenuation artifact. __________   2D echo 11/15/2019 ([place]): Conclusions:                The study quality is technically difficult.  The study is technically limited due to poor acoustic windows.  The left ventricular size is mild to moderately dilated.  Mild to moderate concentric left ventricular hypertrophy is observed.  Global left ventricular wall motion and contractility are within normal  limits.  The estimated ejection fraction is 55-60%.  The left ventricular diastolic filling pattern is consistent with  pseudonormalization.  The left ventricular diastolic filling pattern is consistent with both  elevated mean left atrial pressure and elevated left ventricular  end-diastolic pressure.  There is mild to moderate mitral regurgitation.  There is mild tricuspid regurgitation.  There is mild to moderate pulmonic regurgitation.  There is evidence that  pulmonary hypertension may be underestimated.  The right ventricular systolic pressure is estimated to be 5-10 mmHg.  A trivial pericardial effusion is visualized.  There is no patent foramen ovale visualized.  A patent foramen ovale is not demonstrated by color Doppler.  There is no dilatation of the aortic root.  There is no dilatation of the ascending aorta.  __________   2D echo 11/30/2020: 1. Left ventricular ejection fraction, by estimation, is 55 to 60%. The  left ventricle has normal function. The left ventricle has no regional  wall motion abnormalities. Left ventricular diastolic parameters were  normal.   2. Right ventricular systolic function is normal. The right ventricular  size is normal.   3. The mitral valve is normal in structure. Trivial mitral valve  regurgitation.   4. The aortic valve is normal in structure. Aortic valve regurgitation is  not visualized. __________  2D echo 11/21/2021: 1. Left ventricular ejection fraction, by estimation, is 55 to 60%. The  left  ventricle has normal function. The left ventricle has no regional  wall motion abnormalities. The left ventricular internal cavity size was  mildly dilated. There is mild left  ventricular hypertrophy. Left ventricular diastolic function could not be  evaluated.   2. Right ventricular systolic function is normal. The right ventricular  size is not well visualized.   3. Left atrial size was mild to moderately dilated.   4. The mitral valve is normal in structure. No evidence of mitral valve  regurgitation.   5. The aortic valve was not well visualized. Aortic valve regurgitation  is not visualized.   6. The inferior vena cava is normal in size with <50% respiratory  variability, suggesting right atrial pressure of 8 mmHg.  Patient Profile     51 y.o. female with history of  HFimpEF, CKD stage III, seizure disorder, uncontrolled HTN, and GERD who is being seen today for the evaluation of volume overload at the request of Dr. Denton Lank.  Assessment & Plan    1. HFimpEF: -Dyspnea significantly improved, likely exacerbated by hypertensive heart disease -Prior imaging in 2016 demonstrated an EF of 25-30% by echo, which subsequently improved by echo in 2018 with most recent echo in 11/2020 showing an EF of 55-60% -Renal function slightly worse this morning, would hold further Lasix (has already received dose this morning) -Echo with preserved LV systolic function as outlined above -Coreg, irbesartan   2. Elevated high sensitivity troponin: -Mildly elevated and flat trending, not consistent with ACS -Likely supply demand ischemia in the setting of poorly controlled HTN and CKD -No indication for heparin gtt at this time -Echo with preserved LV systolic function and normal wall motion -Will benefit from ischemic evaluation, consider outpatient coronary CTA -ASA, Lipitor, Imdur  -Chest pain is atypical and reproducible to palpation, concerning for musculoskeletal etiology -Ideally, would  undergo a trial of nonsteroidal, though with noted renal dysfunction that this is not ideal; ongoing management per primary service   3. Hypertensive heart disease: -She reports adherence to her medications and denies missing any doses with BP typically in the 130s at home -Blood pressure > 200 mmHg upon presentation  -Improved to the 130s systolic  -Discontinue Nitropaste with recommendation to escalate isosorbide as indicated -Check renal artery ultrasound -Continue current therapy    4. Acute on CKD stage III: -Lasix holiday following this morning's dose (already received) -Avoid nephrotoxic agents        For questions or updates, please contact [facility] Please consult [email or web address]  for contact info under Cardiology/STEMI.    Signed, Eula Listen, PA-C [facility] Pager: [phone] 11/22/2021, 11:38 AM

## 2021-11-22 NOTE — Assessment & Plan Note (Addendum)
Initial BP 202/90, resulting in pulmonary edema and decompensated CHF. -- Renal artery duplex U/S limited, showed mildly elevated velocities at origin of renal arteries but could not be compared to aortic velocities due to bowel gas --MRA or CTA recommended if high clinical suspicion for RAS Currently lower suspicion for RAS as compliance with meds since admission BP is controlled.  Not resistant hypertension and has been hypotensive needing pressors (off now)

## 2021-11-22 NOTE — Progress Notes (Signed)
Progress Note   Patient: Sheila Hays YQI:[id number] DOB: Jun 03, 1970 DOA: 11/20/2021     1 DOS: the patient was seen and examined on 11/22/2021   Brief hospital course: Sheila Hays is a 51 year old female with morbid obesity, hypertension, hyperlipidemia, chronic HFpEF, CKD 3B, who presented to the ED on 11/20/2021 for evaluation of shortness of breath and chest pain.  Initial blood pressure 202/90 in the ED with otherwise normal vitals, SPO2 of 95% on room air.  Labs were mostly unremarkable but BNP elevated 580.8.  Chest x-ray with cardiomegaly, prominent central pulmonary vessels concerning for CHF and sepsis small/minimal bilateral pleural effusions  Blood pressure was treated with Vasotec 0.625 mg IV in the ED, also given furosemide 40 mg IV.  Admitted to the hospital for further evaluation management of acute on chronic HFpEF requiring IV diuresis, close monitoring and management of hypertensive urgency.  Assessment and Plan: * Acute on chronic heart failure with preserved ejection fraction (HFpEF) (HCC) Presented with shortness of breath and chest pain, orthopnea.  Started on IV Lasix with good response. Echo on showed EF 55 to 60%, diagnostic function could not be evaluated, mild LVH, left atrium mild to moderately dilated. -- Cardiology consulted --Stop Lasix due to rising creatinine --Monitor renal function electrolytes -- Strict I/O's and daily weights   Hypertensive emergency Initial BP 202/90, resulting in pulmonary edema and decompensated CHF. -- Renal artery ultrasound pending -- See essential hypertension  Chest pain Atypical, reproducible on palpation, likely musculoskeletal etiology.  Troponin very minimally elevated with flat trend and EKG is nonacute. Most likely demand ischemia. Not consistent with ACS. --Radiology following --Nitro paste discontinued -- Ischemic evaluation as outpatient  Morbid obesity (HCC) Body mass index is 46.9 kg/m. Complicates  overall care and prognosis.  Recommend lifestyle modifications including physical activity and diet for weight loss and overall long-term health.   Complex partial seizure disorder (HCC) - Resumed ox carbamazepine 600 mg p.o. twice daily; Keppra 500 mg p.o. twice daily - Per med reconciliation, patient is supposed to be taking Lamictal 25 mg tablet, 4 pills twice daily however on admission patient reported she is taking it 25 mg p.o. twice daily --Resumed on Lamictal at 25 mg p.o. twice daily because lamictal requires gradual increase dosing -Follow-up with primary care and/or neurology for further recommendations regarding Lamictal dosing - Ativan 2 mg IV as needed for seizure, 2 doses ordered  Chronic kidney disease, stage 3a (HCC) Renal function near baseline on admission up slightly with IV diuresis.  Monitor renal function closely with diuresis  Creatinine 1.80 today  Essential hypertension --Continue isosorbide mononitrate 30 mg daily, irbesartan 75 mg daily, carvedilol 25 mg p.o. twice daily - Hydralazine 10 mg IV every 6 hours as needed        Subjective: Patient awake sitting up in bed when seen on rounds today.  She reports chest pain feeling better today but still rates it 6 out of 10 in severity.  She has no other acute complaints at this time and has not had any recurrent shortness of breath.  Physical Exam: Vitals:   11/22/21 1026 11/22/21 1242 11/22/21 1257 11/22/21 1540  BP: 124/69 133/68  (!) 112/57  Pulse: 68 66  62  Resp:    18  Temp:    98.1 F (36.7 C)  TempSrc:      SpO2:  (!) 87% 98% 96%  Weight:      Height:       General exam: Awake and alert,  no acute distress, morbidly obese HEENT: Hearing grossly normal, moist mucous membranes Respiratory system: Normal respiratory effort on room air, lungs clear diminished throughout habitus Cardiovascular system: Regular rate and rhythm, resolved bilateral lower extremity edema Central nervous system: A&O x3.  no gross focal neurologic deficits, normal speech Extremities: moves all, normal tone Psychiatry: normal mood, congruent affect, judgement and insight appear normal   Data Reviewed: Notable labs:    Family Communication: None  Disposition: Status is: Inpatient Remains inpatient appropriate because: Ongoing evaluation with pending renal artery ultrasound, worsening renal function warranting close monitoring for improvement prior to discharge   Planned Discharge Destination: Home    Time spent: 35 minutes  Author: Pennie Banter, DO 11/22/2021 5:38 PM  For on call review [email or web address].

## 2021-11-23 ENCOUNTER — Inpatient Hospital Stay: Payer: Medicare Other

## 2021-11-23 DIAGNOSIS — R42 Dizziness and giddiness: Secondary | ICD-10-CM

## 2021-11-23 DIAGNOSIS — R0789 Other chest pain: Secondary | ICD-10-CM

## 2021-11-23 DIAGNOSIS — J9601 Acute respiratory failure with hypoxia: Secondary | ICD-10-CM | POA: Diagnosis present

## 2021-11-23 DIAGNOSIS — N1832 Chronic kidney disease, stage 3b: Secondary | ICD-10-CM

## 2021-11-23 DIAGNOSIS — I5033 Acute on chronic diastolic (congestive) heart failure: Secondary | ICD-10-CM | POA: Diagnosis not present

## 2021-11-23 DIAGNOSIS — I10 Essential (primary) hypertension: Secondary | ICD-10-CM | POA: Diagnosis not present

## 2021-11-23 LAB — BASIC METABOLIC PANEL
Anion gap: 7 (ref 5–15)
BUN: 30 mg/dL — ABNORMAL HIGH (ref 6–20)
CO2: 33 mmol/L — ABNORMAL HIGH (ref 22–32)
Calcium: 8.6 mg/dL — ABNORMAL LOW (ref 8.9–10.3)
Chloride: 102 mmol/L (ref 98–111)
Creatinine, Ser: 1.6 mg/dL — ABNORMAL HIGH (ref 0.44–1.00)
GFR, Estimated: 39 mL/min — ABNORMAL LOW (ref >60)
Glucose, Bld: 77 mg/dL (ref 70–99)
Potassium: 3.9 mmol/L (ref 3.5–5.1)
Sodium: 142 mmol/L (ref 135–145)

## 2021-11-23 LAB — TROPONIN I (HIGH SENSITIVITY): Troponin I (High Sensitivity): 18 ng/L — ABNORMAL HIGH (ref <18)

## 2021-11-23 MED ORDER — EMPAGLIFLOZIN 10 MG PO TABS
10.0000 mg | ORAL_TABLET | Freq: Every day | ORAL | Status: DC
  Administered 2021-11-23 – 2021-11-28 (×6): 10 mg via ORAL
  Filled 2021-11-23 (×7): qty 1

## 2021-11-23 MED ORDER — MORPHINE SULFATE (PF) 2 MG/ML IV SOLN
2.0000 mg | Freq: Once | INTRAVENOUS | Status: AC
  Administered 2021-11-23: 2 mg via INTRAVENOUS
  Filled 2021-11-23: qty 1

## 2021-11-23 NOTE — Progress Notes (Addendum)
Progress Note  Patient Name: Sheila Hays Date of Encounter: 11/23/2021  Primary Cardiologist: New to [facility] - consult by Agbor[place]Etang (previously seen inpatient a [place] in [place])  Subjective   Upon returning from ultrasound this morning, she again noted sharp centralized chest pain. Troponin pending. Dyspnea and lower extremity swelling improving.  Blood pressure is well controlled. Documented urine output 360 mL for the past 24 hours and net -2.2 L for the admission.  Weight trend 131.8 to 129.2 131.5 kg over the past 48 hours.  Renal function improved today.  Inpatient Medications    Scheduled Meds:  vitamin C  500 mg Oral Daily   aspirin EC  81 mg Oral Daily   atorvastatin  40 mg Oral q1800   carvedilol  25 mg Oral BID WC   enoxaparin (LOVENOX) injection  0.5 mg/kg Subcutaneous Q24H   escitalopram  20 mg Oral q morning   folic acid  [id number] mcg Oral Daily   irbesartan  75 mg Oral Daily   isosorbide mononitrate  30 mg Oral Daily   lamoTRIgine  25 mg Oral BID   levETIRAcetam  500 mg Oral BID   mometasone-formoterol  2 puff Inhalation BID   oxcarbazepine  600 mg Oral BID   pantoprazole  40 mg Oral Daily   Continuous Infusions:  PRN Meds: acetaminophen **OR** acetaminophen, albuterol, butalbital-acetaminophen-caffeine, hydrALAZINE, linaclotide, LORazepam, ondansetron **OR** ondansetron (ZOFRAN) IV, mouth rinse, senna-docusate, traZODone   Vital Signs    Vitals:   11/23/21 0019 11/23/21 0425 11/23/21 0429 11/23/21 0736  BP: (!) 135/59  (!) 123/59 (!) 125/51  Pulse: 63  (!) 57 65  Resp: 18  18 17   Temp: 97.8 F (36.6 C)  97.6 F (36.4 C) 98.8 F (37.1 C)  TempSrc:      SpO2: 92%  94% 99%  Weight:  131.5 kg    Height:        Intake/Output Summary (Last 24 hours) at 11/23/2021 1014 Last data filed at 11/23/2021 0736 Gross per 24 hour  Intake 240 ml  Output 600 ml  Net -360 ml    Filed Weights   11/21/21 0325 11/22/21 0335 11/23/21 0425  Weight: 131.8  kg 129.2 kg 131.5 kg    Telemetry    Sinus rhythm - Personally Reviewed  ECG    No new tracings - Personally Reviewed  Physical Exam   GEN: No acute distress.   Neck: JVD difficult to assess secondary to body habitus. Cardiac: RRR, no murmurs, rubs, or gallops.  Palpation of the anterior chest wall does not reproduce her chest discomfort today. Respiratory: Diminished breath sounds along the bases bilaterally.  GI: Soft, nontender, non-distended.   MS: Trivial bilateral lower extremity pretibial edema; No deformity. Neuro:  Alert and oriented x 3; Nonfocal.  Psych: Normal affect.  Labs    Chemistry Recent Labs  Lab 11/21/21 0518 11/22/21 0551 11/23/21 0621  NA 140 143 142  K 4.3 4.0 3.9  CL 104 102 102  CO2 30 33* 33*  GLUCOSE 94 72 77  BUN 23* 30* 30*  CREATININE 1.57* 1.80* 1.60*  CALCIUM 9.0 8.7* 8.6*  GFRNONAA 39* 33* 39*  ANIONGAP 6 8 7       Hematology Recent Labs  Lab 11/20/21 1352 11/21/21 0518  WBC 4.3 5.6  RBC 4.38 4.49  HGB 11.7* 11.9*  HCT 38.5 38.9  MCV 87.9 86.6  MCH 26.7 26.5  MCHC 30.4 30.6  RDW 16.6* 16.1*  PLT 222 247  Cardiac EnzymesNo results for input(s): "TROPONINI" in the last 168 hours. No results for input(s): "TROPIPOC" in the last 168 hours.   BNP Recent Labs  Lab 11/20/21 1352  BNP 580.8*      DDimer No results for input(s): "DDIMER" in the last 168 hours.   Radiology    DG Chest 2 View  Result Date: 11/20/2021 IMPRESSION: Cardiomegaly. Central pulmonary vessels are prominent suggesting CHF. Possible minimal bilateral pleural effusions. Electronically Signed   By: Ernie Avena M.D.   On: 11/20/2021 14:26    Cardiac Studies   Nuclear stress test 05/01/2015 ([facility]): IMPRESSION-  1.  MYOCARDIAL PERFUSION STUDY IS ABNORMAL AND SUGGESTIVE OF  MYOCARDIAL ISCHEMIA.   2.  THERE IS A PORTON OF THE BASE TO MID PORTION OF THE LATERAL  INFERIOR SEGMENT OF MODERATE SEVERE COUNTS THAT  DID SEEM IMPROVED WITH  REST.   3.  GATED IMAGES CALCULATED AN EJECTION FRACTION OF 35%.  ___________   2D echo 05/02/2015 ([facility]): CONCLUSIONS-  The study quality is technically difficult.  The study is technically limited due to poor acoustic windows.  The left ventricular size is mild to moderately dilated.  Mild to moderate concentric left ventricular hypertrophy is observed.  There is severely decreased left ventricular systolic function.  The estimated ejection fraction is 25-30%.  The left ventricular diastolic filling pattern is restrictive.  The left ventricular diastolic filling pattern is consistent with  elevated mean left atrial pressure.  There is mild to moderate mitral regurgitation.  There is mild tricuspid regurgitation.  There is mild to moderate pulmonic regurgitation.  There is evidence of moderate pulmonary hypertension.  The right ventricular systolic pressure is estimated to be 55-60 mmHg.  A trivial pericardial effusion is visualized.  There is no patent foramen ovale visualized.  A patent foramen ovale is not demonstrated with color doppler and  agitated saline contrast.  __________   2D echo 04/01/2017 ([facility]): Conclusions  Moderate concentric left ventricular hypertrophy  Normal left ventricular size and systolic function with no appreciable  segmental abnormality.  Ejection fraction is visually estimated at 50-55%  Normal right ventricle structure and function.  __________   [facility]ugen[facility]ks [facility] 03/06/2018: There was no ST segment deviation noted during stress. No T wave inversion was noted during stress. Defect 1: There is a small defect of mild severity present in the basal anteroseptal and mid anteroseptal location. Findings consistent with ischemia. This is a low risk study. The left ventricular ejection fraction is mildly decreased (45-54%). Anteroseptal perfusion defect does not correlate with the inferior wall motion  abnormality. The anteroseptal defect may be either ischemia or breast attenuation artifact. __________   2D echo 11/15/2019 ([facility]): Conclusions:                The study quality is technically difficult.  The study is technically limited due to poor acoustic windows.  The left ventricular size is mild to moderately dilated.  Mild to moderate concentric left ventricular hypertrophy is observed.  Global left ventricular wall motion and contractility are within normal  limits.  The estimated ejection fraction is 55-60%.  The left ventricular diastolic filling pattern is consistent with  pseudonormalization.  The left ventricular diastolic filling pattern is consistent with both  elevated mean left atrial pressure and elevated left ventricular  end-diastolic pressure.  There is mild to moderate mitral regurgitation.  There is mild tricuspid regurgitation.  There is mild to  moderate pulmonic regurgitation.  There is evidence that pulmonary hypertension may be underestimated.  The right ventricular systolic pressure is estimated to be 5-10 mmHg.  A trivial pericardial effusion is visualized.  There is no patent foramen ovale visualized.  A patent foramen ovale is not demonstrated by color Doppler.  There is no dilatation of the aortic root.  There is no dilatation of the ascending aorta.  __________   2D echo 11/30/2020: 1. Left ventricular ejection fraction, by estimation, is 55 to 60%. The  left ventricle has normal function. The left ventricle has no regional  wall motion abnormalities. Left ventricular diastolic parameters were  normal.   2. Right ventricular systolic function is normal. The right ventricular  size is normal.   3. The mitral valve is normal in structure. Trivial mitral valve  regurgitation.   4. The aortic valve is normal in structure. Aortic valve regurgitation is  not visualized. __________  2D echo 11/21/2021: 1. Left ventricular  ejection fraction, by estimation, is 55 to 60%. The  left ventricle has normal function. The left ventricle has no regional  wall motion abnormalities. The left ventricular internal cavity size was  mildly dilated. There is mild left  ventricular hypertrophy. Left ventricular diastolic function could not be  evaluated.   2. Right ventricular systolic function is normal. The right ventricular  size is not well visualized.   3. Left atrial size was mild to moderately dilated.   4. The mitral valve is normal in structure. No evidence of mitral valve  regurgitation.   5. The aortic valve was not well visualized. Aortic valve regurgitation  is not visualized.   6. The inferior vena cava is normal in size with <50% respiratory  variability, suggesting right atrial pressure of 8 mmHg.  Patient Profile     51 y.o. female with history of  HFimpEF, CKD stage III, seizure disorder, uncontrolled HTN, and GERD who is being seen today for the evaluation of volume overload at the request of Dr. Denton Lank.  Assessment & Plan    1. HFimpEF: -Dyspnea significantly improved, likely exacerbated by hypertensive heart disease -Prior imaging in 2016 demonstrated an EF of 25-30% by echo, which subsequently improved by echo in 2018 with most recent echo in 11/2020 showing an EF of 55-60% -Renal function improved today -Echo with preserved LV systolic function as outlined above -Coreg, irbesartan   2. Elevated high sensitivity troponin: -Mildly elevated and flat trending, not consistent with ACS -Likely supply demand ischemia in the setting of poorly controlled HTN and CKD -No indication for heparin gtt at this time -Echo with preserved LV systolic function and normal wall motion -Will benefit from ischemic evaluation, consider outpatient coronary CTA -ASA, Lipitor, Imdur  -Chest pain is atypical and historically has been reproducible to palpation, concerning for musculoskeletal etiology -With recurrent  chest pain this morning, troponin and EKG have been ordered -Ideally, would undergo a trial of nonsteroidal, though with noted renal dysfunction that this is not ideal; ongoing management per primary service -Not ideal to proceed with SPECT/CT given body habitus, would like to avoid LHC with renal dysfunction, though if she continues to have pain, and her renal function continues to improve, we may need to consider cath prior to discharge -If pain resolves with medical therapy, would pursue PET/CT as an outpatient   3. Hypertensive heart disease: -She reports adherence to her medications and denies missing any doses with BP typically in the 130s at home -Blood pressure > 200 mmHg  upon presentation  -Improved to the [id number] systolic  -Renal artery ultrasound pending -Continue current therapy    4. Acute on CKD stage III: -monitoring closely -Avoid nephrotoxic agents     For questions or updates, please contact [place] Please consult [email or web address] for contact info under Cardiology/STEMI.    Signed, Christell Faith, PA-C [place] Pager: [phone] 11/23/2021, 10:14 AM

## 2021-11-23 NOTE — Progress Notes (Signed)
SATURATION QUALIFICATIONS: (This note is used to comply with regulatory documentation for home oxygen)  Patient Saturations on Room Air at Rest = 95%  Patient Saturations on Room Air while Ambulating = 87%  Patient Saturations on 2 Liters of oxygen while Ambulating = 96%  Please briefly explain why patient needs home oxygen: Needs oxygen to ambulated and complete adl's

## 2021-11-23 NOTE — Assessment & Plan Note (Signed)
7/1 - with BP control improved, patient having significant dizziness when up to ambulate.  She has had some soft BP's 99/52, 112/57.  Suspect orthostatic hypotension vs pt used to high baseline BP.

## 2021-11-23 NOTE — Assessment & Plan Note (Signed)
Patient presented with hypertensive heart failure and dyspnea.  Despite diuresis she continues to require oxygen.  Qualifies for home oxygen at 2 L/min.  Suspect due to underlying OHS, pulmonary HTN.   --Home O2 ordered -- Supplemental O2, maintain sats >90% --Sleep study recommended

## 2021-11-23 NOTE — TOC CM/SW Note (Signed)
Notified patient is DC and needs home o2.  Referral made to [facility] with [employer] for home o2. She is aware of DC today.  Alfonso Ramus, [place] [phone]

## 2021-11-23 NOTE — Progress Notes (Signed)
Progress Note   Patient: Sheila Hays UDJ:[id number] DOB: 09/03/1970 DOA: 11/20/2021     2 DOS: the patient was seen and examined on 11/23/2021   Brief hospital course: Sheila Hays is a 51 year old female with morbid obesity, hypertension, hyperlipidemia, chronic HFpEF, CKD 3B, who presented to the ED on 11/20/2021 for evaluation of shortness of breath and chest pain.  Initial blood pressure 202/90 in the ED with otherwise normal vitals, SPO2 of 95% on room air.  Labs were mostly unremarkable but BNP elevated 580.8.  Chest x-ray with cardiomegaly, prominent central pulmonary vessels concerning for CHF and sepsis small/minimal bilateral pleural effusions  Blood pressure was treated with Vasotec 0.625 mg IV in the ED, also given furosemide 40 mg IV.  Admitted to the hospital for further evaluation management of acute on chronic HFpEF requiring IV diuresis, close monitoring and management of hypertensive urgency.  Assessment and Plan: * Acute on chronic heart failure with preserved ejection fraction (HFpEF) (HCC) Presented with shortness of breath and chest pain, orthopnea.  Started on IV Lasix with good response. Echo on showed EF 55 to 60%, diagnostic function could not be evaluated, mild LVH, left atrium mild to moderately dilated. -- Cardiology consulted --Off Lasix due to rising creatinine --Monitor renal function electrolytes -- Strict I/O's and Hays weights   Hypertensive emergency Initial BP 202/90, resulting in pulmonary edema and decompensated CHF. -- Renal artery duplex U/S limited, showed mildly elevated velocities at origin of renal arteries but could not be compared to aortic velocities due to bowel gas --MRA or CTA recommended if high clinical suspicion for RAS Currently lower suspicion for RAS as compliance with meds since admission BP is controlled.  Not resistant hypertension. -- See essential hypertension  Acute respiratory failure with hypoxia Sheila Hays) Patient  presented with hypertensive heart failure and dyspnea.  Despite diuresis Sheila continues to require oxygen.  Qualifies for home oxygen at 2 L/min.  Suspect due to underlying OHS, ?pulmonary HTN.   --Home O2 ordered --Maintain sats >90% --Sleep study recommended  Dizziness 7/1 - with BP control improved, patient having significant dizziness when up to ambulate.  Sheila Hays, 112/57.  Suspect orthostatic hypotension vs pt used to high baseline BP.  Chest pain Atypical, reproducible on palpation, likely musculoskeletal etiology vs esophageal etiology.  Troponin very minimally elevated with flat trend and EKG is nonacute. Most likely demand ischemia. Not consistent with ACS. 7/1 - recurrent bout this AM, trop down to 18 from 28 and EKG remains normal. --Cardiology following --Nitro paste discontinued -- Ischemic evaluation as outpatient  Morbid obesity (HCC) Body mass index is 46.9 kg/m. Complicates overall care and prognosis.  Recommend lifestyle modifications including physical activity and diet for weight loss and overall long-term health.  Highly suspect OSA and/or OHS. Recommend sleep study as outpatient. Qualifies for home O2 here.   Complex partial seizure disorder (HCC) - Resumed ox carbamazepine 600 mg p.o. twice Hays; Keppra 500 mg p.o. twice Hays - Per med reconciliation, patient is supposed to be taking Lamictal 25 mg tablet, 4 pills twice Hays however on admission patient reported Sheila is taking it 25 mg p.o. twice Hays --Resumed on Lamictal at 25 mg p.o. twice Hays because lamictal requires gradual increase dosing -Follow-up with primary care and/or neurology for further recommendations regarding Lamictal dosing - Ativan 2 mg IV as needed for seizure, 2 doses ordered  Chronic kidney disease, stage 3a (HCC) Renal function near baseline on admission up trended with IV  diuresis. Slowly improving since Lasix stopped. --Monitor renal function    Creatinine 1.80 >> 1.60 today  Essential hypertension Presented with hypertensive emergency with pulmonary edema and CHF decompensation.  BP now controlled but with intermittent hypotension.  --Sheila Hays --Imdur 30 mg Hays --Sheila Hays --Hydralazine 10 mg IV PRN        Subjective: Patient awake sitting up in bed when seen on rounds today.  Sheila reports getting dizzy at times especially when up to ambulate earlier.  Sheila had an episode of worsening chest pain again earlier this morning but better at the time of my encounter.  Sheila is not sure if Sheila has history of reflux.  No other acute complaints at this time  Physical Exam: Vitals:   11/23/21 0736 11/23/21 1121 11/23/21 1200 11/23/21 1637  BP: (!) 125/51 127/60 (!) 163/79 101/71  Pulse: 65 66  61  Resp: 17 18  19   Temp: 98.8 F (37.1 C) 98.3 F (36.8 C)  98 F (36.7 C)  TempSrc:      SpO2: 99% 99%  100%  Weight:      Height:       General exam: Awake and alert, no acute distress, morbidly obese HEENT: Hearing grossly normal, moist mucous membranes Respiratory system: Normal respiratory effort on room air, lungs clear diminished throughout due to habitus Cardiovascular system: Regular rate and rhythm, no lower extremity edema Central nervous system: A&O x3. no gross focal neurologic deficits, normal speech Extremities: moves all, normal tone Psychiatry: normal mood, congruent affect, judgement and insight appear normal   Data Reviewed: Notable labs: Bicarb 33, BUN 30, creatinine improved 1.60, calcium 8.6, GFR 39.  Troponin level 18.  EKG normal sinus rhythm with no ST-T wave segment changes   Family Communication: None  Disposition: Status is: Inpatient Remains inpatient appropriate because: Renal function remains above baseline, dizziness with ambulation with episodes of hypotension. Requires further close monitoring and evaluation.   Planned Discharge Destination: Home    Time spent: 35  minutes  Author: , DO 11/23/2021 8:02 PM  For on call review [email or web address].

## 2021-11-23 NOTE — Progress Notes (Signed)
51 y o f, FC with chest pain and CHF exacerbation, NSR on telly. Currently on 2L [place] (acute). Plan is to wean patient to Room Air.  Patient is independent, can ambulate, alert and oriented. Currently on Lasix for bilateral lower extremity edema +3 and fluid overload.  Patient has been NPO as of midnight for a Renal Artery Ultra sound tentatively planned for  9:30 am.  Strict I's and Os, Daily weights and fluid restriction.

## 2021-11-23 NOTE — Progress Notes (Signed)
Cardiac monitoring order renewed 

## 2021-11-24 DIAGNOSIS — I5033 Acute on chronic diastolic (congestive) heart failure: Secondary | ICD-10-CM | POA: Diagnosis not present

## 2021-11-24 DIAGNOSIS — I10 Essential (primary) hypertension: Secondary | ICD-10-CM | POA: Diagnosis not present

## 2021-11-24 DIAGNOSIS — N1832 Chronic kidney disease, stage 3b: Secondary | ICD-10-CM | POA: Diagnosis not present

## 2021-11-24 DIAGNOSIS — E8729 Other acidosis: Secondary | ICD-10-CM | POA: Clinically undetermined

## 2021-11-24 DIAGNOSIS — J9601 Acute respiratory failure with hypoxia: Secondary | ICD-10-CM

## 2021-11-24 LAB — BASIC METABOLIC PANEL
Anion gap: 5 (ref 5–15)
BUN: 25 mg/dL — ABNORMAL HIGH (ref 6–20)
CO2: 34 mmol/L — ABNORMAL HIGH (ref 22–32)
Calcium: 8.5 mg/dL — ABNORMAL LOW (ref 8.9–10.3)
Chloride: 101 mmol/L (ref 98–111)
Creatinine, Ser: 1.57 mg/dL — ABNORMAL HIGH (ref 0.44–1.00)
GFR, Estimated: 39 mL/min — ABNORMAL LOW (ref >60)
Glucose, Bld: 102 mg/dL — ABNORMAL HIGH (ref 70–99)
Potassium: 4.1 mmol/L (ref 3.5–5.1)
Sodium: 140 mmol/L (ref 135–145)

## 2021-11-24 LAB — BLOOD GAS, VENOUS
Acid-Base Excess: 10.6 mmol/L — ABNORMAL HIGH (ref 0.0–2.0)
Bicarbonate: 40.9 mmol/L — ABNORMAL HIGH (ref 20.0–28.0)
O2 Saturation: 94 %
Patient temperature: 37
pCO2, Ven: 85 mmHg (ref 44–60)
pH, Ven: 7.29 (ref 7.25–7.43)
pO2, Ven: 70 mmHg — ABNORMAL HIGH (ref 32–45)

## 2021-11-24 LAB — GLUCOSE, CAPILLARY: Glucose-Capillary: 112 mg/dL — ABNORMAL HIGH (ref 70–99)

## 2021-11-24 MED ORDER — AMLODIPINE BESYLATE 5 MG PO TABS
5.0000 mg | ORAL_TABLET | Freq: Every day | ORAL | Status: DC
  Administered 2021-11-24 – 2021-11-29 (×6): 5 mg via ORAL
  Filled 2021-11-24 (×6): qty 1

## 2021-11-24 MED ORDER — HYDROCODONE-ACETAMINOPHEN 5-325 MG PO TABS
1.0000 | ORAL_TABLET | Freq: Four times a day (QID) | ORAL | Status: DC | PRN
  Administered 2021-11-24 – 2021-11-29 (×8): 1 via ORAL
  Filled 2021-11-24 (×9): qty 1

## 2021-11-24 NOTE — Assessment & Plan Note (Addendum)
Suspect this is chronic and likely related to OHS.  A.m. VBG on 7/2 with pH of 7.29, PCO2 of 85, HCO3 40.9 and PO2 of 70. -- Outpatient sleep study -- Trial overnight BiPAP -- Patient likely needs home NIV

## 2021-11-24 NOTE — Progress Notes (Signed)
Progress Note   Patient: Sheila Hays ZMO:[id number] DOB: [birth year] DOA: 11/20/2021     3 DOS: the patient was seen and examined on 11/24/2021   Brief hospital course: Sheila Hays is a 51 year old female with morbid obesity, hypertension, hyperlipidemia, chronic HFpEF, CKD 3B, who presented to the ED on 11/20/2021 for evaluation of shortness of breath and chest pain.  Initial blood pressure 202/90 in the ED with otherwise normal vitals, SPO2 of 95% on room air.  Labs were mostly unremarkable but BNP elevated 580.8.  Chest x-ray with cardiomegaly, prominent central pulmonary vessels concerning for CHF and sepsis small/minimal bilateral pleural effusions  Blood pressure was treated with Vasotec 0.625 mg IV in the ED, also given furosemide 40 mg IV.  Admitted to the hospital for further evaluation management of acute on chronic HFpEF requiring IV diuresis, close monitoring and management of hypertensive urgency.  Assessment and Plan: * Acute on chronic heart failure with preserved ejection fraction (HFpEF) (HCC) Presented with shortness of breath and chest pain, orthopnea.  Started on IV Lasix with good response. Echo on showed EF 55 to 60%, diagnostic function could not be evaluated, mild LVH, left atrium mild to moderately dilated. -- Cardiology consulted --Off Lasix due to rising creatinine, slowly improving --Given ongoing hypoxia and volume status difficult to assess, breathing unchanged since admission, cardiology pursuing right heart cath tomorrow --Monitor renal function electrolytes -- Strict I/O's and daily weights   Hypertensive emergency Initial BP 202/90, resulting in pulmonary edema and decompensated CHF. -- Renal artery duplex U/S limited, showed mildly elevated velocities at origin of renal arteries but could not be compared to aortic velocities due to bowel gas --MRA or CTA recommended if high clinical suspicion for RAS Currently lower suspicion for RAS as compliance  with meds since admission BP is controlled.  Not resistant hypertension. -- See essential hypertension  Acute respiratory failure with hypoxia [facility]) Patient presented with hypertensive heart failure and dyspnea.  Despite diuresis she continues to require oxygen.  Qualifies for home oxygen at 2 L/min.  Suspect due to underlying OHS, ?pulmonary HTN.   --Cardiology planning for right heart cath tomorrow --Home O2 ordered -- Supplemental O2, maintain sats >90% --Sleep study recommended  Dizziness 7/1 - with BP control improved, patient having significant dizziness when up to ambulate.  She has had some soft BP's 99/52, 112/57.  Suspect orthostatic hypotension vs pt used to high baseline BP.  Chest pain Atypical, reproducible on palpation, likely musculoskeletal etiology vs esophageal etiology.  Troponin very minimally elevated with flat trend and EKG is nonacute. Most likely demand ischemia. Not consistent with ACS. 7/1 - recurrent bout this AM, trop down to 18 from 28 and EKG remains normal. --Cardiology following --Nitro paste discontinued -- Ischemic evaluation as outpatient  Morbid obesity (HCC) Body mass index is 46.9 kg/m. Complicates overall care and prognosis.  Recommend lifestyle modifications including physical activity and diet for weight loss and overall long-term health.  Highly suspect OSA and/or OHS. Recommend sleep study as outpatient. Qualifies for home O2 here.   Complex partial seizure disorder (HCC) - Resumed ox carbamazepine 600 mg p.o. twice daily; Keppra 500 mg p.o. twice daily - Per med reconciliation, patient is supposed to be taking Lamictal 25 mg tablet, 4 pills twice daily however on admission patient reported she is taking it 25 mg p.o. twice daily --Resumed on Lamictal at 25 mg p.o. twice daily because lamictal requires gradual increase dosing -Follow-up with primary care and/or neurology for further recommendations regarding  Lamictal dosing - Ativan 2 mg  IV as needed for seizure, 2 doses ordered  Chronic kidney disease, stage 3a (HCC) Renal function near baseline on admission up trended with IV diuresis. Slowly improving since Lasix stopped. --Monitor renal function   Creatinine 1.80 >> 1.60 today  Essential hypertension Presented with hypertensive emergency with pulmonary edema and CHF decompensation.  BP now controlled but with intermittent hypotension.  --Coreg 25 mg BID --Imdur 30 mg daily --Irbesartan 75 mg daily --Hydralazine 10 mg IV PRN -- Cardiology added 5 mg amlodipine  BP is quite labile ranging in past 24 hours from 101/71 to 181/75        Subjective: Patient awake sitting up in bed when seen on rounds today.  She had another episode of chest pain earlier this morning, improved with pain medication.  Breathing feels about the same.  She confirms getting quite dizzy yesterday with attempting to ambulate in the hallway.  Cardiology planning for right heart cath tomorrow patient remains agreeable.  Physical Exam: Vitals:   11/24/21 0812 11/24/21 0815 11/24/21 0817 11/24/21 1205  BP: 131/61 (!) 181/75 (!) 168/80 (!) 108/56  Pulse: 70 71 71 63  Resp:    18  Temp:      TempSrc:      SpO2: 93% 94% 94% 98%  Weight:      Height:       General exam: Awake and alert, no acute distress, morbidly obese HEENT: Hearing grossly normal, moist mucous membranes Respiratory system: Lungs clear but diminished due to body habitus, no wheezes or rhonchi, on 2 L/min [place] O2 Cardiovascular system: Regular rate and rhythm, no lower extremity edema Central nervous system: A&O x3. no gross focal neurologic deficits, normal speech Extremities: moves all, normal tone Psychiatry: normal mood, congruent affect, judgement and insight appear normal   Data Reviewed: Notable labs:  A.m. VBG pH 7.29, PCO2 85, PO2 70, bicarb 40.9.  BMP with bicarb 34, glucose 102, BUN 25, creatinine slightly improved 1.57, calcium 8.5, GFR 39  Family  Communication: None  Disposition: Status is: Inpatient Remains inpatient appropriate because: Renal function remains above baseline, dizziness with ambulation, cardiology planning for right heart cath tomorrow   Planned Discharge Destination: Home    Time spent: 35 minutes  Author: Pennie Banter, DO 11/24/2021 2:23 PM  For on call review [email or web address].

## 2021-11-24 NOTE — H&P (View-Only) (Signed)
Progress Note  Patient Name: Sheila Hays Date of Encounter: 11/24/2021  [facility] Cardiologist: needs to establish in [place]  Subjective   No acute events overnight. Breathing largely unchanged. We discussed options for further workup, see below.   Inpatient Medications    Scheduled Meds:  amLODipine  5 mg Oral Daily   vitamin C  500 mg Oral Daily   aspirin EC  81 mg Oral Daily   atorvastatin  40 mg Oral q1800   carvedilol  25 mg Oral BID WC   empagliflozin  10 mg Oral Daily   enoxaparin (LOVENOX) injection  0.5 mg/kg Subcutaneous Q24H   escitalopram  20 mg Oral q morning   folic acid  [id number] mcg Oral Daily   irbesartan  75 mg Oral Daily   isosorbide mononitrate  30 mg Oral Daily   lamoTRIgine  25 mg Oral BID   levETIRAcetam  500 mg Oral BID   mometasone-formoterol  2 puff Inhalation BID   oxcarbazepine  600 mg Oral BID   pantoprazole  40 mg Oral Daily   Continuous Infusions:  PRN Meds: albuterol, butalbital-acetaminophen-caffeine, HYDROcodone-acetaminophen, linaclotide, LORazepam, ondansetron **OR** ondansetron (ZOFRAN) IV, mouth rinse, senna-docusate, traZODone   Vital Signs    Vitals:   11/24/21 0812 11/24/21 0815 11/24/21 0817 11/24/21 1205  BP: 131/61 (!) 181/75 (!) 168/80 (!) 108/56  Pulse: 70 71 71 63  Resp:    18  Temp:      TempSrc:      SpO2: 93% 94% 94% 98%  Weight:      Height:        Intake/Output Summary (Last 24 hours) at 11/24/2021 1346 Last data filed at 11/24/2021 1203 Gross per 24 hour  Intake 580 ml  Output --  Net 580 ml      11/24/2021    4:03 AM 11/23/2021    4:25 AM 11/22/2021    3:35 AM  Last 3 Weights  Weight (lbs) 288 lb 12.8 oz 289 lb 14.5 oz 284 lb 12.8 oz  Weight (kg) 131 kg 131.5 kg 129.184 kg      Telemetry    SR - Personally Reviewed  ECG    No new - Personally Reviewed  Physical Exam   GEN: No acute distress.   Neck: JVD not well visualized 2/2 body habitus Cardiac: RRR, no murmurs, rubs, or  gallops.  Respiratory: Distant but clear to auscultation bilaterally in upper fields, not well heard at bases GI: Soft, nontender, non-distended  MS: No edema; No deformity. Neuro:  Nonfocal  Psych: Normal affect   Labs    High Sensitivity Troponin:   Recent Labs  Lab 11/20/21 1352 11/20/21 1634 11/21/21 1019 11/23/21 0942  TROPONINIHS 23* 21* 28* 18*     Chemistry Recent Labs  Lab 11/22/21 0551 11/23/21 0621 11/24/21 0526  NA 143 142 140  K 4.0 3.9 4.1  CL 102 102 101  CO2 33* 33* 34*  GLUCOSE 72 77 102*  BUN 30* 30* 25*  CREATININE 1.80* 1.60* 1.57*  CALCIUM 8.7* 8.6* 8.5*  MG 2.1  --   --   GFRNONAA 33* 39* 39*  ANIONGAP 8 7 5     Lipids No results for input(s): "CHOL", "TRIG", "HDL", "LABVLDL", "LDLCALC", "CHOLHDL" in the last 168 hours.  Hematology Recent Labs  Lab 11/20/21 1352 11/21/21 0518  WBC 4.3 5.6  RBC 4.38 4.49  HGB 11.7* 11.9*  HCT 38.5 38.9  MCV 87.9 86.6  MCH 26.7 26.5  MCHC 30.4  30.6  RDW 16.6* 16.1*  PLT 222 247   Thyroid No results for input(s): "TSH", "FREET4" in the last 168 hours.  BNP Recent Labs  Lab 11/20/21 1352  BNP 580.8*    DDimer No results for input(s): "DDIMER" in the last 168 hours.   Radiology    US RENAL ARTERY DUPLEX COMPLETE  Result Date: 11/23/2021 CLINICAL DATA:  Hypertension EXAM: RENAL/URINARY TRACT ULTRASOUND RENAL DUPLEX DOPPLER ULTRASOUND COMPARISON:  CT angiography chest abdomen pelvis 07/26/2017 FINDINGS: Right Kidney: Length: 8.4 cm. Echogenicity within normal limits. Irregular thinning of the upper pole renal cortex again seen. No mass or hydronephrosis visualized. Visualization of the right kidney is limited due to adjacent shadowing bowel gas. Left Kidney: Length: 8.4 cm. Echogenicity within normal limits. No mass or hydronephrosis visualized. Bladder:  Evaluation is limited due to under distension. RENAL DUPLEX ULTRASOUND Right Renal Artery Velocities: Origin:  247 cm/sec Mid:  207 cm/sec [place]:  204  cm/sec Interlobar:  40 cm/sec Arcuate:  54 cm/sec Left Renal Artery Velocities: Origin:  208 cm/sec Mid:  202 cm/sec [place]:  209 cm/sec Interlobar:  48 cm/sec Arcuate:  42 cm/sec Aortic Velocity:  Obscured by shadowing bowel gas. Right Renal-Aortic Ratios: Unable to calculate Left Renal-Aortic Ratios: Unable to calculate IMPRESSION: Limited examination as the abdominal aorta is obscured by shadowing bowel gas, therefore renal artery to aorta ratios cannot be calculated. Mildly elevated velocities seen at the origin of the renal arteries bilaterally. If there is high clinical suspicion for renal artery stenosis, further evaluation with MRA or CTA of the renal artery should be performed. Electronically Signed   By: Acquanetta Belling M.D.   On: 11/23/2021 10:16    Cardiac Studies   Nuclear stress test 05/01/2015 ([facility]): IMPRESSION-  1.  MYOCARDIAL PERFUSION STUDY IS ABNORMAL AND SUGGESTIVE OF  MYOCARDIAL ISCHEMIA.   2.  THERE IS A PORTON OF THE BASE TO MID PORTION OF THE LATERAL  INFERIOR SEGMENT OF MODERATE SEVERE COUNTS THAT DID SEEM IMPROVED WITH  REST.   3.  GATED IMAGES CALCULATED AN EJECTION FRACTION OF 35%.  ___________   2D echo 05/02/2015 ([facility]): CONCLUSIONS-  The study quality is technically difficult.  The study is technically limited due to poor acoustic windows.  The left ventricular size is mild to moderately dilated.  Mild to moderate concentric left ventricular hypertrophy is observed.  There is severely decreased left ventricular systolic function.  The estimated ejection fraction is 25-30%.  The left ventricular diastolic filling pattern is restrictive.  The left ventricular diastolic filling pattern is consistent with  elevated mean left atrial pressure.  There is mild to moderate mitral regurgitation.  There is mild tricuspid regurgitation.  There is mild to moderate pulmonic regurgitation.  There is evidence of moderate  pulmonary hypertension.  The right ventricular systolic pressure is estimated to be 55-60 mmHg.  A trivial pericardial effusion is visualized.  There is no patent foramen ovale visualized.  A patent foramen ovale is not demonstrated with color doppler and  agitated saline contrast.  __________   2D echo 04/01/2017 ([facility]): Conclusions  Moderate concentric left ventricular hypertrophy  Normal left ventricular size and systolic function with no appreciable  segmental abnormality.  Ejection fraction is visually estimated at 50-55%  Normal right ventricle structure and function.  __________   [facility] 03/06/2018: There was no ST segment deviation noted during stress. No T wave inversion was noted during stress. Defect 1: There is a  small defect of mild severity present in the basal anteroseptal and mid anteroseptal location. Findings consistent with ischemia. This is a low risk study. The left ventricular ejection fraction is mildly decreased (45-54%). Anteroseptal perfusion defect does not correlate with the inferior wall motion abnormality. The anteroseptal defect may be either ischemia or breast attenuation artifact. __________   2D echo 11/15/2019 ([facility]): Conclusions:                The study quality is technically difficult.  The study is technically limited due to poor acoustic windows.  The left ventricular size is mild to moderately dilated.  Mild to moderate concentric left ventricular hypertrophy is observed.  Global left ventricular wall motion and contractility are within normal  limits.  The estimated ejection fraction is 55-60%.  The left ventricular diastolic filling pattern is consistent with  pseudonormalization.  The left ventricular diastolic filling pattern is consistent with both  elevated mean left atrial pressure and elevated left ventricular  end-diastolic pressure.  There is mild to moderate mitral regurgitation.  There is  mild tricuspid regurgitation.  There is mild to moderate pulmonic regurgitation.  There is evidence that pulmonary hypertension may be underestimated.  The right ventricular systolic pressure is estimated to be 5-10 mmHg.  A trivial pericardial effusion is visualized.  There is no patent foramen ovale visualized.  A patent foramen ovale is not demonstrated by color Doppler.  There is no dilatation of the aortic root.  There is no dilatation of the ascending aorta.  __________   2D echo 11/30/2020: 1. Left ventricular ejection fraction, by estimation, is 55 to 60%. The  left ventricle has normal function. The left ventricle has no regional  wall motion abnormalities. Left ventricular diastolic parameters were  normal.   2. Right ventricular systolic function is normal. The right ventricular  size is normal.   3. The mitral valve is normal in structure. Trivial mitral valve  regurgitation.   4. The aortic valve is normal in structure. Aortic valve regurgitation is  not visualized. __________   2D echo 11/21/2021: 1. Left ventricular ejection fraction, by estimation, is 55 to 60%. The  left ventricle has normal function. The left ventricle has no regional  wall motion abnormalities. The left ventricular internal cavity size was  mildly dilated. There is mild left  ventricular hypertrophy. Left ventricular diastolic function could not be  evaluated.   2. Right ventricular systolic function is normal. The right ventricular  size is not well visualized.   3. Left atrial size was mild to moderately dilated.   4. The mitral valve is normal in structure. No evidence of mitral valve  regurgitation.   5. The aortic valve was not well visualized. Aortic valve regurgitation  is not visualized.   6. The inferior vena cava is normal in size with <50% respiratory  variability, suggesting right atrial pressure of 8 mmHg.    Patient Profile     51 y.o. female with history of  HFimpEF, CKD  stage III, seizure disorder, uncontrolled HTN, and GERD who is being seen today for the evaluation of volume overload at the request of Dr. Arbutus Ped.  Assessment & Plan    Chronic diastolic heart failure, with history of chronic systolic and diastolic heart failure, with improved EF Hypertension, with hypertensive urgency on admission Chronic kidney disease stage 3b -EF 25-30% in 2016, now 55-60% -renal function improving with holding diuretics, but breathing largely unchanged. -continue carvedilol 25 mg BID,  irbesartan 75 mg daily, imur 30 mg daily -admission weight 131.8 kg, current weight 131 kg, charted net negative 1 L -GFR over 30, started empagliflozin 10 mg daily given CKD and HFpEF -BP has been very labile, 101/71-181/75, but trending up over the last 24 hours. Will add amlodipine 5 mg daily. If renal function remains stable, may be able to change amlodipine to spironolactone given HFpEF -renal duplex limited, mildly elevated velocities at origin of renal arteries bilaterally but cannot be compared to aorta velocities due to bowel gas   Atypical chest pain Elevated troponin, consistent with demand ischemia Hypertensive urgency on admission -continue aspirin 81 mg daily, atorvastatin 40 mg daily -consider outpatient coronary CTA vs PET-CT   hypoxic respiratory failure, suspect 2/2 OHS vs unknown etiology -unclear timeline of this, recommended for home O2 this admission. Did not improve with diuresis, reports some PND  Given her hypoxic respiratory failure and unclear volume status, we discussed RHC at length today. She is amenable. NPO at midnight, orders pended. Sent note to day team tomorrow to make sure she gets on schedule.  Risks and benefits of cardiac catheterization have been discussed with the patient.  These include bleeding, infection, kidney damage, stroke, heart attack, death.  The patient understands these risks and is willing to proceed.   For questions or updates,  please contact [facility] Please consult [email or web address] for contact info under        Signed, Jodelle Red, MD  11/24/2021, 1:46 PM

## 2021-11-24 NOTE — Progress Notes (Signed)
Progress Note  Patient Name: Sheila Hays Date of Encounter: 11/24/2021  [facility] Cardiologist: needs to establish in [place]  Subjective   No acute events overnight. Breathing largely unchanged. We discussed options for further workup, see below.   Inpatient Medications    Scheduled Meds:  amLODipine  5 mg Oral Daily   vitamin C  500 mg Oral Daily   aspirin EC  81 mg Oral Daily   atorvastatin  40 mg Oral q1800   carvedilol  25 mg Oral BID WC   empagliflozin  10 mg Oral Daily   enoxaparin (LOVENOX) injection  0.5 mg/kg Subcutaneous Q24H   escitalopram  20 mg Oral q morning   folic acid  [id number] mcg Oral Daily   irbesartan  75 mg Oral Daily   isosorbide mononitrate  30 mg Oral Daily   lamoTRIgine  25 mg Oral BID   levETIRAcetam  500 mg Oral BID   mometasone-formoterol  2 puff Inhalation BID   oxcarbazepine  600 mg Oral BID   pantoprazole  40 mg Oral Daily   Continuous Infusions:  PRN Meds: albuterol, butalbital-acetaminophen-caffeine, HYDROcodone-acetaminophen, linaclotide, LORazepam, ondansetron **OR** ondansetron (ZOFRAN) IV, mouth rinse, senna-docusate, traZODone   Vital Signs    Vitals:   11/24/21 0812 11/24/21 0815 11/24/21 0817 11/24/21 1205  BP: 131/61 (!) 181/75 (!) 168/80 (!) 108/56  Pulse: 70 71 71 63  Resp:    18  Temp:      TempSrc:      SpO2: 93% 94% 94% 98%  Weight:      Height:        Intake/Output Summary (Last 24 hours) at 11/24/2021 1346 Last data filed at 11/24/2021 1203 Gross per 24 hour  Intake 580 ml  Output --  Net 580 ml      11/24/2021    4:03 AM 11/23/2021    4:25 AM 11/22/2021    3:35 AM  Last 3 Weights  Weight (lbs) 288 lb 12.8 oz 289 lb 14.5 oz 284 lb 12.8 oz  Weight (kg) 131 kg 131.5 kg 129.184 kg      Telemetry    SR - Personally Reviewed  ECG    No new - Personally Reviewed  Physical Exam   GEN: No acute distress.   Neck: JVD not well visualized 2/2 body habitus Cardiac: RRR, no murmurs, rubs, or  gallops.  Respiratory: Distant but clear to auscultation bilaterally in upper fields, not well heard at bases GI: Soft, nontender, non-distended  MS: No edema; No deformity. Neuro:  Nonfocal  Psych: Normal affect   Labs    High Sensitivity Troponin:   Recent Labs  Lab 11/20/21 1352 11/20/21 1634 11/21/21 1019 11/23/21 0942  TROPONINIHS 23* 21* 28* 18*     Chemistry Recent Labs  Lab 11/22/21 0551 11/23/21 0621 11/24/21 0526  NA 143 142 140  K 4.0 3.9 4.1  CL 102 102 101  CO2 33* 33* 34*  GLUCOSE 72 77 102*  BUN 30* 30* 25*  CREATININE 1.80* 1.60* 1.57*  CALCIUM 8.7* 8.6* 8.5*  MG 2.1  --   --   GFRNONAA 33* 39* 39*  ANIONGAP 8 7 5     Lipids No results for input(s): "CHOL", "TRIG", "HDL", "LABVLDL", "LDLCALC", "CHOLHDL" in the last 168 hours.  Hematology Recent Labs  Lab 11/20/21 1352 11/21/21 0518  WBC 4.3 5.6  RBC 4.38 4.49  HGB 11.7* 11.9*  HCT 38.5 38.9  MCV 87.9 86.6  MCH 26.7 26.5  MCHC 30.4  30.6  RDW 16.6* 16.1*  PLT 222 247   Thyroid No results for input(s): "TSH", "FREET4" in the last 168 hours.  BNP Recent Labs  Lab 11/20/21 1352  BNP 580.8*    DDimer No results for input(s): "DDIMER" in the last 168 hours.   Radiology    US RENAL ARTERY DUPLEX COMPLETE  Result Date: 11/23/2021 CLINICAL DATA:  Hypertension EXAM: RENAL/URINARY TRACT ULTRASOUND RENAL DUPLEX DOPPLER ULTRASOUND COMPARISON:  CT angiography chest abdomen pelvis 07/26/2017 FINDINGS: Right Kidney: Length: 8.4 cm. Echogenicity within normal limits. Irregular thinning of the upper pole renal cortex again seen. No mass or hydronephrosis visualized. Visualization of the right kidney is limited due to adjacent shadowing bowel gas. Left Kidney: Length: 8.4 cm. Echogenicity within normal limits. No mass or hydronephrosis visualized. Bladder:  Evaluation is limited due to under distension. RENAL DUPLEX ULTRASOUND Right Renal Artery Velocities: Origin:  247 cm/sec Mid:  207 cm/sec Hilum:  204  cm/sec Interlobar:  40 cm/sec Arcuate:  54 cm/sec Left Renal Artery Velocities: Origin:  208 cm/sec Mid:  202 cm/sec Hilum:  209 cm/sec Interlobar:  48 cm/sec Arcuate:  42 cm/sec Aortic Velocity:  Obscured by shadowing bowel gas. Right Renal-Aortic Ratios: Unable to calculate Left Renal-Aortic Ratios: Unable to calculate IMPRESSION: Limited examination as the abdominal aorta is obscured by shadowing bowel gas, therefore renal artery to aorta ratios cannot be calculated. Mildly elevated velocities seen at the origin of the renal arteries bilaterally. If there is high clinical suspicion for renal artery stenosis, further evaluation with MRA or CTA of the renal artery should be performed. Electronically Signed   By: Acquanetta Belling M.D.   On: 11/23/2021 10:16    Cardiac Studies   Nuclear stress test 05/01/2015 ([facility]): IMPRESSION-  1.  MYOCARDIAL PERFUSION STUDY IS ABNORMAL AND SUGGESTIVE OF  MYOCARDIAL ISCHEMIA.   2.  THERE IS A PORTON OF THE BASE TO MID PORTION OF THE LATERAL  INFERIOR SEGMENT OF MODERATE SEVERE COUNTS THAT DID SEEM IMPROVED WITH  REST.   3.  GATED IMAGES CALCULATED AN EJECTION FRACTION OF 35%.  ___________   2D echo 05/02/2015 ([facility]): CONCLUSIONS-  The study quality is technically difficult.  The study is technically limited due to poor acoustic windows.  The left ventricular size is mild to moderately dilated.  Mild to moderate concentric left ventricular hypertrophy is observed.  There is severely decreased left ventricular systolic function.  The estimated ejection fraction is 25-30%.  The left ventricular diastolic filling pattern is restrictive.  The left ventricular diastolic filling pattern is consistent with  elevated mean left atrial pressure.  There is mild to moderate mitral regurgitation.  There is mild tricuspid regurgitation.  There is mild to moderate pulmonic regurgitation.  There is evidence of moderate  pulmonary hypertension.  The right ventricular systolic pressure is estimated to be 55-60 mmHg.  A trivial pericardial effusion is visualized.  There is no patent foramen ovale visualized.  A patent foramen ovale is not demonstrated with color doppler and  agitated saline contrast.  __________   2D echo 04/01/2017 ([facility]): Conclusions  Moderate concentric left ventricular hypertrophy  Normal left ventricular size and systolic function with no appreciable  segmental abnormality.  Ejection fraction is visually estimated at 50-55%  Normal right ventricle structure and function.  __________   Eugenie Birks [facility] 03/06/2018: There was no ST segment deviation noted during stress. No T wave inversion was noted during stress. Defect 1: There is a  small defect of mild severity present in the basal anteroseptal and mid anteroseptal location. Findings consistent with ischemia. This is a low risk study. The left ventricular ejection fraction is mildly decreased (45-54%). Anteroseptal perfusion defect does not correlate with the inferior wall motion abnormality. The anteroseptal defect may be either ischemia or breast attenuation artifact. __________   2D echo 11/15/2019 ([place]): Conclusions:                The study quality is technically difficult.  The study is technically limited due to poor acoustic windows.  The left ventricular size is mild to moderately dilated.  Mild to moderate concentric left ventricular hypertrophy is observed.  Global left ventricular wall motion and contractility are within normal  limits.  The estimated ejection fraction is 55-60%.  The left ventricular diastolic filling pattern is consistent with  pseudonormalization.  The left ventricular diastolic filling pattern is consistent with both  elevated mean left atrial pressure and elevated left ventricular  end-diastolic pressure.  There is mild to moderate mitral regurgitation.  There is  mild tricuspid regurgitation.  There is mild to moderate pulmonic regurgitation.  There is evidence that pulmonary hypertension may be underestimated.  The right ventricular systolic pressure is estimated to be 5-10 mmHg.  A trivial pericardial effusion is visualized.  There is no patent foramen ovale visualized.  A patent foramen ovale is not demonstrated by color Doppler.  There is no dilatation of the aortic root.  There is no dilatation of the ascending aorta.  __________   2D echo 11/30/2020: 1. Left ventricular ejection fraction, by estimation, is 55 to 60%. The  left ventricle has normal function. The left ventricle has no regional  wall motion abnormalities. Left ventricular diastolic parameters were  normal.   2. Right ventricular systolic function is normal. The right ventricular  size is normal.   3. The mitral valve is normal in structure. Trivial mitral valve  regurgitation.   4. The aortic valve is normal in structure. Aortic valve regurgitation is  not visualized. __________   2D echo 11/21/2021: 1. Left ventricular ejection fraction, by estimation, is 55 to 60%. The  left ventricle has normal function. The left ventricle has no regional  wall motion abnormalities. The left ventricular internal cavity size was  mildly dilated. There is mild left  ventricular hypertrophy. Left ventricular diastolic function could not be  evaluated.   2. Right ventricular systolic function is normal. The right ventricular  size is not well visualized.   3. Left atrial size was mild to moderately dilated.   4. The mitral valve is normal in structure. No evidence of mitral valve  regurgitation.   5. The aortic valve was not well visualized. Aortic valve regurgitation  is not visualized.   6. The inferior vena cava is normal in size with <50% respiratory  variability, suggesting right atrial pressure of 8 mmHg.    Patient Profile     51 y.o. female with history of  HFimpEF, CKD  stage III, seizure disorder, uncontrolled HTN, and GERD who is being seen today for the evaluation of volume overload at the request of Dr. Arbutus Ped.  Assessment & Plan    Chronic diastolic heart failure, with history of chronic systolic and diastolic heart failure, with improved EF Hypertension, with hypertensive urgency on admission Chronic kidney disease stage 3b -EF 25-30% in 2016, now 55-60% -renal function improving with holding diuretics, but breathing largely unchanged. -continue carvedilol 25 mg BID,  irbesartan 75 mg daily, imur 30 mg daily -admission weight 131.8 kg, current weight 131 kg, charted net negative 1 L -GFR over 30, started empagliflozin 10 mg daily given CKD and HFpEF -BP has been very labile, 101/71-181/75, but trending up over the last 24 hours. Will add amlodipine 5 mg daily. If renal function remains stable, may be able to change amlodipine to spironolactone given HFpEF -renal duplex limited, mildly elevated velocities at origin of renal arteries bilaterally but cannot be compared to aorta velocities due to bowel gas   Atypical chest pain Elevated troponin, consistent with demand ischemia Hypertensive urgency on admission -continue aspirin 81 mg daily, atorvastatin 40 mg daily -consider outpatient coronary CTA vs PET-CT   hypoxic respiratory failure, suspect 2/2 OHS vs unknown etiology -unclear timeline of this, recommended for home O2 this admission. Did not improve with diuresis, reports some PND  Given her hypoxic respiratory failure and unclear volume status, we discussed RHC at length today. She is amenable. NPO at midnight, orders pended. Sent note to day team tomorrow to make sure she gets on schedule.  Risks and benefits of cardiac catheterization have been discussed with the patient.  These include bleeding, infection, kidney damage, stroke, heart attack, death.  The patient understands these risks and is willing to proceed.   For questions or updates,  please contact [facility] Please consult [email or web address] for contact info under        Signed, Jodelle Red, MD  11/24/2021, 1:46 PM

## 2021-11-25 ENCOUNTER — Encounter: Payer: Self-pay | Admitting: Cardiovascular Disease

## 2021-11-25 ENCOUNTER — Encounter
Admission: EM | Disposition: A | Discharge status: Home / Self Care | Payer: Self-pay | Source: Home / Self Care | Attending: Osteopathic Medicine

## 2021-11-25 DIAGNOSIS — I5033 Acute on chronic diastolic (congestive) heart failure: Secondary | ICD-10-CM | POA: Diagnosis not present

## 2021-11-25 DIAGNOSIS — I272 Pulmonary hypertension, unspecified: Secondary | ICD-10-CM | POA: Diagnosis present

## 2021-11-25 DIAGNOSIS — J9691 Respiratory failure, unspecified with hypoxia: Secondary | ICD-10-CM | POA: Diagnosis not present

## 2021-11-25 HISTORY — PX: RIGHT HEART CATH: CATH118263

## 2021-11-25 LAB — BASIC METABOLIC PANEL
Anion gap: 8 (ref 5–15)
BUN: 21 mg/dL — ABNORMAL HIGH (ref 6–20)
CO2: 35 mmol/L — ABNORMAL HIGH (ref 22–32)
Calcium: 8.9 mg/dL (ref 8.9–10.3)
Chloride: 101 mmol/L (ref 98–111)
Creatinine, Ser: 1.41 mg/dL — ABNORMAL HIGH (ref 0.44–1.00)
GFR, Estimated: 45 mL/min — ABNORMAL LOW (ref >60)
Glucose, Bld: 83 mg/dL (ref 70–99)
Potassium: 4.2 mmol/L (ref 3.5–5.1)
Sodium: 144 mmol/L (ref 135–145)

## 2021-11-25 LAB — GLUCOSE, CAPILLARY
Glucose-Capillary: 89 mg/dL (ref 70–99)
Glucose-Capillary: 84 mg/dL (ref 70–99)

## 2021-11-25 SURGERY — RIGHT HEART CATH
Anesthesia: Moderate Sedation

## 2021-11-25 MED ORDER — SODIUM CHLORIDE 0.9% FLUSH
3.0000 mL | Freq: Two times a day (BID) | INTRAVENOUS | Status: DC
  Administered 2021-11-25 – 2021-12-04 (×16): 3 mL via INTRAVENOUS

## 2021-11-25 MED ORDER — LIDOCAINE HCL (PF) 1 % IJ SOLN
INTRAMUSCULAR | Status: DC | PRN
  Administered 2021-11-25: 2 mL

## 2021-11-25 MED ORDER — FENTANYL CITRATE (PF) 100 MCG/2ML IJ SOLN
INTRAMUSCULAR | Status: AC
  Filled 2021-11-25: qty 2

## 2021-11-25 MED ORDER — MIDAZOLAM HCL 2 MG/2ML IJ SOLN
INTRAMUSCULAR | Status: AC
  Filled 2021-11-25: qty 2

## 2021-11-25 MED ORDER — SODIUM CHLORIDE 0.9 % IV SOLN: INTRAVENOUS | Status: DC

## 2021-11-25 MED ORDER — FUROSEMIDE 10 MG/ML IJ SOLN
4.0000 mg/h | INTRAVENOUS | Status: DC
  Administered 2021-11-25: 4 mg/h via INTRAVENOUS
  Administered 2021-11-27: 6 mg/h via INTRAVENOUS
  Filled 2021-11-25 (×2): qty 20

## 2021-11-25 MED ORDER — FENTANYL CITRATE (PF) 100 MCG/2ML IJ SOLN
INTRAMUSCULAR | Status: DC | PRN
  Administered 2021-11-25: 25 ug via INTRAVENOUS

## 2021-11-25 MED ORDER — MIDAZOLAM HCL 2 MG/2ML IJ SOLN
INTRAMUSCULAR | Status: DC | PRN
  Administered 2021-11-25: 1 mg via INTRAVENOUS

## 2021-11-25 MED ORDER — SODIUM CHLORIDE 0.9% FLUSH
3.0000 mL | Freq: Two times a day (BID) | INTRAVENOUS | Status: DC
  Administered 2021-11-25 – 2021-12-06 (×10): 3 mL via INTRAVENOUS

## 2021-11-25 MED ORDER — SODIUM CHLORIDE 0.9% FLUSH
3.0000 mL | INTRAVENOUS | Status: DC | PRN
Start: 2021-11-25 — End: 2021-11-25

## 2021-11-25 MED ORDER — LIDOCAINE HCL 1 % IJ SOLN
INTRAMUSCULAR | Status: AC
  Filled 2021-11-25: qty 20

## 2021-11-25 MED ORDER — HEPARIN (PORCINE) IN NACL 1000-0.9 UT/500ML-% IV SOLN
INTRAVENOUS | Status: DC | PRN
  Administered 2021-11-25 (×2): 500 mL

## 2021-11-25 MED ORDER — SODIUM CHLORIDE 0.9 % IV SOLN: 250.0000 mL | INTRAVENOUS | Status: DC | PRN

## 2021-11-25 MED ORDER — SODIUM CHLORIDE 0.9% FLUSH
3.0000 mL | INTRAVENOUS | Status: DC | PRN
  Administered 2021-11-30: 3 mL via INTRAVENOUS

## 2021-11-25 SURGICAL SUPPLY — 8 items
CATH BALLN WEDGE 5F 110CM (CATHETERS) ×1 IMPLANT
DRAPE BRACHIAL (DRAPES) ×1 IMPLANT
KIT RIGHT HEART ACIST (MISCELLANEOUS) ×1 IMPLANT
KIT SYRINGE INJ CVI SPIKEX1 (MISCELLANEOUS) ×1 IMPLANT
PACK CARDIAC CATH (CUSTOM PROCEDURE TRAY) ×2 IMPLANT
PROTECTION STATION PRESSURIZED (MISCELLANEOUS) ×2
SHEATH GLIDE SLENDER 4/5FR (SHEATH) ×1 IMPLANT
STATION PROTECTION PRESSURIZED (MISCELLANEOUS) IMPLANT

## 2021-11-25 NOTE — Assessment & Plan Note (Signed)
Likely related to untreated OHS/OSA, morbid obesity.  Right Heart Cath on 7/3 --showed evidence of severely elevated right and left-sided filling pressures, severe pulmonary hypertension and normal cardiac output. --Lasix drip per cardiology

## 2021-11-25 NOTE — Progress Notes (Signed)
Progress Note  Patient Name: Sheila Hays Date of Encounter: 11/25/2021  [facility] Cardiologist: needs to establish in [place] with significant dyspnea at rest.  She denies chest pain.  Right heart catheterization was performed which showed severely elevated filling pressures and severe pulmonary hypertension.  Inpatient Medications    Scheduled Meds:  [MAR Hold] amLODipine  5 mg Oral Daily   [MAR Hold] vitamin C  500 mg Oral Daily   [MAR Hold] aspirin EC  81 mg Oral Daily   [MAR Hold] atorvastatin  40 mg Oral q1800   [MAR Hold] carvedilol  25 mg Oral BID WC   [MAR Hold] empagliflozin  10 mg Oral Daily   [MAR Hold] enoxaparin (LOVENOX) injection  0.5 mg/kg Subcutaneous Q24H   [MAR Hold] escitalopram  20 mg Oral q morning   [MAR Hold] folic acid  [id number] mcg Oral Daily   [MAR Hold] irbesartan  75 mg Oral Daily   [MAR Hold] isosorbide mononitrate  30 mg Oral Daily   [MAR Hold] lamoTRIgine  25 mg Oral BID   [MAR Hold] levETIRAcetam  500 mg Oral BID   [MAR Hold] mometasone-formoterol  2 puff Inhalation BID   [MAR Hold] oxcarbazepine  600 mg Oral BID   [MAR Hold] pantoprazole  40 mg Oral Daily   [MAR Hold] sodium chloride flush  3 mL Intravenous Q12H   sodium chloride flush  3 mL Intravenous Q12H   Continuous Infusions:  sodium chloride     [START ON 11/26/2021] sodium chloride     sodium chloride     PRN Meds: sodium chloride, sodium chloride, [MAR Hold] albuterol, [MAR Hold] butalbital-acetaminophen-caffeine, fentaNYL, Heparin (Porcine) in NaCl, [MAR Hold] HYDROcodone-acetaminophen, lidocaine (PF), [MAR Hold] linaclotide, [MAR Hold] LORazepam, midazolam, [MAR Hold] ondansetron **OR** [MAR Hold] ondansetron (ZOFRAN) IV, [MAR Hold] mouth rinse, [MAR Hold] senna-docusate, sodium chloride flush, sodium chloride flush, [MAR Hold] traZODone   Vital Signs    Vitals:   11/25/21 0845 11/25/21 0850 11/25/21 0855 11/25/21 0900  BP:  (!) 163/69 (!) 171/84  (!) 170/84  Pulse:  71 69 71  Resp:  17 14 14   Temp:      TempSrc:      SpO2: (!) 78% (!) 80% (!) 86% 92%  Weight:      Height:        Intake/Output Summary (Last 24 hours) at 11/25/2021 0914 Last data filed at 11/24/2021 2015 Gross per 24 hour  Intake 700 ml  Output --  Net 700 ml       11/25/2021    3:00 AM 11/24/2021    4:03 AM 11/23/2021    4:25 AM  Last 3 Weights  Weight (lbs) 290 lb 5.5 oz 288 lb 12.8 oz 289 lb 14.5 oz  Weight (kg) 131.7 kg 131 kg 131.5 kg      Telemetry    SR - Personally Reviewed  ECG    No new - Personally Reviewed  Physical Exam   GEN: No acute distress.   Neck: JVD not well visualized 2/2 body habitus Cardiac: RRR, no murmurs, rubs, or gallops.  Respiratory: Distant but clear to auscultation bilaterally in upper fields, not well heard at bases GI: Soft, nontender, non-distended  MS: No edema; No deformity. Neuro:  Nonfocal  Psych: Normal affect   Labs    High Sensitivity Troponin:   Recent Labs  Lab 11/20/21 1352 11/20/21 1634 11/21/21 1019 11/23/21 0942  TROPONINIHS 23* 21* 28* 18*  Chemistry Recent Labs  Lab 11/22/21 0551 11/23/21 0621 11/24/21 0526 11/25/21 0520  NA 143 142 140 144  K 4.0 3.9 4.1 4.2  CL 102 102 101 101  CO2 33* 33* 34* 35*  GLUCOSE 72 77 102* 83  BUN 30* 30* 25* 21*  CREATININE 1.80* 1.60* 1.57* 1.41*  CALCIUM 8.7* 8.6* 8.5* 8.9  MG 2.1  --   --   --   GFRNONAA 33* 39* 39* 45*  ANIONGAP 8 7 5 8      Lipids No results for input(s): "CHOL", "TRIG", "HDL", "LABVLDL", "LDLCALC", "CHOLHDL" in the last 168 hours.  Hematology Recent Labs  Lab 11/20/21 1352 11/21/21 0518  WBC 4.3 5.6  RBC 4.38 4.49  HGB 11.7* 11.9*  HCT 38.5 38.9  MCV 87.9 86.6  MCH 26.7 26.5  MCHC 30.4 30.6  RDW 16.6* 16.1*  PLT 222 247    Thyroid No results for input(s): "TSH", "FREET4" in the last 168 hours.  BNP Recent Labs  Lab 11/20/21 1352  BNP 580.8*     DDimer No results for input(s): "DDIMER" in the last  168 hours.   Radiology    CARDIAC CATHETERIZATION  Result Date: 11/25/2021   Hemodynamic findings consistent with severe pulmonary hypertension. Successful right heart catheterization via the right antecubital vein. This showed evidence of severely elevated right and left-sided filling pressures, severe pulmonary hypertension and normal cardiac output. RA: 22 mmHg RV: 92/18 with an end-diastolic pressure of 32 mmHg. PA: 95/44 with a mean of 64 mmHg PCW: 30 mmHg Cardiac output: 7.4 with an index of 3.16. Pulmonary vascular resistance: 4.5 Woods units Recommendations: The patient is significantly volume overloaded. Pulmonary hypertension is severe and seems to be of a mixed venous and arterial etiology. We will start the patient on furosemide drip and the dose should be uptitrated based on urine output response.  Diuresis might be difficult due to the presence of severe pulmonary hypertension and likely RV dysfunction.  If renal function worsens with diuresis, she might require inotropic therapy for RV support and consideration to transfer to an advanced heart failure service.   01/26/2022 RENAL ARTERY DUPLEX COMPLETE  Result Date: 11/23/2021 CLINICAL DATA:  Hypertension EXAM: RENAL/URINARY TRACT ULTRASOUND RENAL DUPLEX DOPPLER ULTRASOUND COMPARISON:  CT angiography chest abdomen pelvis 07/26/2017 FINDINGS: Right Kidney: Length: 8.4 cm. Echogenicity within normal limits. Irregular thinning of the upper pole renal cortex again seen. No mass or hydronephrosis visualized. Visualization of the right kidney is limited due to adjacent shadowing bowel gas. Left Kidney: Length: 8.4 cm. Echogenicity within normal limits. No mass or hydronephrosis visualized. Bladder:  Evaluation is limited due to under distension. RENAL DUPLEX ULTRASOUND Right Renal Artery Velocities: Origin:  247 cm/sec Mid:  207 cm/sec Hilum:  204 cm/sec Interlobar:  40 cm/sec Arcuate:  54 cm/sec Left Renal Artery Velocities: Origin:  208 cm/sec Mid:  202  cm/sec Hilum:  209 cm/sec Interlobar:  48 cm/sec Arcuate:  42 cm/sec Aortic Velocity:  Obscured by shadowing bowel gas. Right Renal-Aortic Ratios: Unable to calculate Left Renal-Aortic Ratios: Unable to calculate IMPRESSION: Limited examination as the abdominal aorta is obscured by shadowing bowel gas, therefore renal artery to aorta ratios cannot be calculated. Mildly elevated velocities seen at the origin of the renal arteries bilaterally. If there is high clinical suspicion for renal artery stenosis, further evaluation with MRA or CTA of the renal artery should be performed. Electronically Signed   By: 09/25/2017 M.D.   On: 11/23/2021 10:16    Cardiac  Studies   Nuclear stress test 05/01/2015 ([place]): IMPRESSION-  1.  MYOCARDIAL PERFUSION STUDY IS ABNORMAL AND SUGGESTIVE OF  MYOCARDIAL ISCHEMIA.   2.  THERE IS A PORTON OF THE BASE TO MID PORTION OF THE LATERAL  INFERIOR SEGMENT OF MODERATE SEVERE COUNTS THAT DID SEEM IMPROVED WITH  REST.   3.  GATED IMAGES CALCULATED AN EJECTION FRACTION OF 35%.  ___________   2D echo 05/02/2015 ([place]): CONCLUSIONS-  The study quality is technically difficult.  The study is technically limited due to poor acoustic windows.  The left ventricular size is mild to moderately dilated.  Mild to moderate concentric left ventricular hypertrophy is observed.  There is severely decreased left ventricular systolic function.  The estimated ejection fraction is 25-30%.  The left ventricular diastolic filling pattern is restrictive.  The left ventricular diastolic filling pattern is consistent with  elevated mean left atrial pressure.  There is mild to moderate mitral regurgitation.  There is mild tricuspid regurgitation.  There is mild to moderate pulmonic regurgitation.  There is evidence of moderate pulmonary hypertension.  The right ventricular systolic pressure is estimated to be 55-60 mmHg.  A trivial  pericardial effusion is visualized.  There is no patent foramen ovale visualized.  A patent foramen ovale is not demonstrated with color doppler and  agitated saline contrast.  __________   2D echo 04/01/2017 ([place]): Conclusions  Moderate concentric left ventricular hypertrophy  Normal left ventricular size and systolic function with no appreciable  segmental abnormality.  Ejection fraction is visually estimated at 50-55%  Normal right ventricle structure and function.  __________   [facility] 03/06/2018: There was no ST segment deviation noted during stress. No T wave inversion was noted during stress. Defect 1: There is a small defect of mild severity present in the basal anteroseptal and mid anteroseptal location. Findings consistent with ischemia. This is a low risk study. The left ventricular ejection fraction is mildly decreased (45-54%). Anteroseptal perfusion defect does not correlate with the inferior wall motion abnormality. The anteroseptal defect may be either ischemia or breast attenuation artifact. __________   2D echo 11/15/2019 ([place]): Conclusions:                The study quality is technically difficult.  The study is technically limited due to poor acoustic windows.  The left ventricular size is mild to moderately dilated.  Mild to moderate concentric left ventricular hypertrophy is observed.  Global left ventricular wall motion and contractility are within normal  limits.  The estimated ejection fraction is 55-60%.  The left ventricular diastolic filling pattern is consistent with  pseudonormalization.  The left ventricular diastolic filling pattern is consistent with both  elevated mean left atrial pressure and elevated left ventricular  end-diastolic pressure.  There is mild to moderate mitral regurgitation.  There is mild tricuspid regurgitation.  There is mild to moderate pulmonic regurgitation.  There is evidence that  pulmonary hypertension may be underestimated.  The right ventricular systolic pressure is estimated to be 5-10 mmHg.  A trivial pericardial effusion is visualized.  There is no patent foramen ovale visualized.  A patent foramen ovale is not demonstrated by color Doppler.  There is no dilatation of the aortic root.  There is no dilatation of the ascending aorta.  __________   2D echo 11/30/2020: 1. Left ventricular ejection fraction, by estimation, is 55 to 60%. The  left ventricle has normal function. The  left ventricle has no regional  wall motion abnormalities. Left ventricular diastolic parameters were  normal.   2. Right ventricular systolic function is normal. The right ventricular  size is normal.   3. The mitral valve is normal in structure. Trivial mitral valve  regurgitation.   4. The aortic valve is normal in structure. Aortic valve regurgitation is  not visualized. __________   2D echo 11/21/2021: 1. Left ventricular ejection fraction, by estimation, is 55 to 60%. The  left ventricle has normal function. The left ventricle has no regional  wall motion abnormalities. The left ventricular internal cavity size was  mildly dilated. There is mild left  ventricular hypertrophy. Left ventricular diastolic function could not be  evaluated.   2. Right ventricular systolic function is normal. The right ventricular  size is not well visualized.   3. Left atrial size was mild to moderately dilated.   4. The mitral valve is normal in structure. No evidence of mitral valve  regurgitation.   5. The aortic valve was not well visualized. Aortic valve regurgitation  is not visualized.   6. The inferior vena cava is normal in size with <50% respiratory  variability, suggesting right atrial pressure of 8 mmHg.    Patient Profile     52 y.o. female with history of  HFimpEF, CKD stage III, seizure disorder, uncontrolled HTN, and GERD who is being seen today for the evaluation of volume  overload at the request of Dr. Denton Lank.  Assessment & Plan    1.  Acute on chronic diastolic heart failure with severe pulmonary hypertension :  -EF 25-30% in 2016, now 55-60% -She had worsening renal function with diuresis but that has stabilized. Right heart catheterization was performed this morning which showed severely elevated right and left-sided filling pressures, severe pulmonary hypertension and normal cardiac output. -continue carvedilol 25 mg BID, irbesartan 75 mg daily, imdur 30 mg daily -GFR over 30, started empagliflozin 10 mg daily given CKD and HFpEF -renal duplex limited, mildly elevated velocities at origin of renal arteries bilaterally but cannot be compared to aorta velocities due to bowel gas.  I added furosemide drip at 4 mg/h.  The dose should be adjusted as needed based on urine output.  Diuresis might be difficult due to the presence of severe pulmonary hypertension and likely RV dysfunction.  If renal function worsens with diuresis, she might require inotropic therapy for RV support and consideration for transfer to advanced heart failure service.   2.  Atypical chest pain Elevated troponin, consistent with demand ischemia Hypertensive urgency on admission -continue aspirin 81 mg daily, atorvastatin 40 mg daily -consider outpatient coronary CTA vs PET-CT   3.  Hypoxic respiratory failure, likely due to underlying obesity hypoventilation syndrome as well as chronic diastolic heart failure and chronic volume overload.  4.  Severe pulmonary hypertension: Likely of mixed venous and arterial etiology.  Pulmonary vascular resistance was 4.5 Woods units.  This is likely due to chronic diastolic heart failure as well as obesity hypoventilation syndrome.  Overall prognosis is poor due to this.  Hopefully this will improve with diuresis but she might require a repeat right heart catheterization once volume overload is optimized.   For questions or updates, please contact  [facility] Please consult [email or web address] for contact info under        Signed, Lorine Bears, MD  11/25/2021, 9:14 AM

## 2021-11-25 NOTE — Progress Notes (Signed)
Progress Note   Patient: Sheila Hays HQI:[id number] DOB: 09-Feb-1971 DOA: 11/20/2021     4 DOS: the patient was seen and examined on 11/25/2021   Brief hospital course: Sheila Hays is a 51 year old female with morbid obesity, hypertension, hyperlipidemia, chronic HFpEF, CKD 3B, who presented to the ED on 11/20/2021 for evaluation of shortness of breath and chest pain.  Initial blood pressure 202/90 in the ED with otherwise normal vitals, SPO2 of 95% on room air.  Labs were mostly unremarkable but BNP elevated 580.8.  Chest x-ray with cardiomegaly, prominent central pulmonary vessels concerning for CHF and sepsis small/minimal bilateral pleural effusions  Blood pressure was treated with Vasotec 0.625 mg IV in the ED, also given furosemide 40 mg IV.  Admitted to the hospital for further evaluation management of acute on chronic HFpEF requiring IV diuresis, close monitoring and management of hypertensive urgency.  Assessment and Plan: * Acute on chronic heart failure with preserved ejection fraction (HFpEF) (HCC) Presented with shortness of breath and chest pain, orthopnea.  Started on IV Lasix with good response. Echo on showed EF 55 to 60%, diagnostic function could not be evaluated, mild LVH, left atrium mild to moderately dilated. -- Cardiology consulted --Cardiology starting Lasix drip after RHC today showed volume overload, severe pulmonary HTN --Monitor renal function electrolytes -- Strict I/O's and daily weights   Hypertensive emergency Initial BP 202/90, resulting in pulmonary edema and decompensated CHF. -- Renal artery duplex U/S limited, showed mildly elevated velocities at origin of renal arteries but could not be compared to aortic velocities due to bowel gas --MRA or CTA recommended if high clinical suspicion for RAS Currently lower suspicion for RAS as compliance with meds since admission BP is controlled.  Not resistant hypertension. -- See essential  hypertension  Pulmonary hypertension (HCC) Likely related to untreated OHS/OSA, morbid obesity.  Right Heart Cath on 7/3 --showed evidence of severely elevated right and left-sided filling pressures, severe pulmonary hypertension and normal cardiac output. 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  A.m. VBG on 7/2 with pH of 7.29, PCO2 of 85, HCO3 40.9 and PO2 of 70. -- Outpatient sleep study -- Trial overnight BiPAP -- Patient likely needs home NIV  Acute respiratory failure with hypoxia [facility]) Patient presented with hypertensive heart failure and dyspnea.  Despite diuresis she continues to require oxygen.  Qualifies for home oxygen at 2 L/min.  Suspect due to underlying OHS, ?pulmonary HTN.   --Cardiology planning for right heart cath tomorrow --Home O2 ordered -- Supplemental O2, maintain sats >90% --Sleep study recommended  Dizziness 7/1 - with BP control improved, patient having significant dizziness when up to ambulate.  She has had some soft BP's 99/52, 112/57.  Suspect orthostatic hypotension vs pt used to high baseline BP.  Chest pain Atypical, reproducible on palpation, likely musculoskeletal etiology vs esophageal etiology.  Troponin very minimally elevated with flat trend and EKG is nonacute. Most likely demand ischemia. Not consistent with ACS. 7/1 - recurrent bout this AM, trop down to 18 from 28 and EKG remains normal. --Cardiology following --Nitro paste discontinued -- Ischemic evaluation as outpatient  Morbid obesity (HCC) Body mass index is 46.9 kg/m. Complicates overall care and prognosis.  Recommend lifestyle modifications including physical activity and diet for weight loss and overall long-term health.  Highly suspect OSA and/or OHS. Recommend sleep study as outpatient. Qualifies for home O2 here.   Complex partial seizure disorder (HCC) - Resumed ox  carbamazepine 600 mg p.o. twice daily;  Keppra 500 mg p.o. twice daily - Per med reconciliation, patient is supposed to be taking Lamictal 25 mg tablet, 4 pills twice daily however on admission patient reported she is taking it 25 mg p.o. twice daily --Resumed on Lamictal at 25 mg p.o. twice daily because lamictal requires gradual increase dosing -Follow-up with primary care and/or neurology for further recommendations regarding Lamictal dosing - Ativan 2 mg IV as needed for seizure, 2 doses ordered  Chronic kidney disease, stage 3a (HCC) Renal function near baseline on admission up trended with IV diuresis. Slowly improving since Lasix stopped. --Monitor renal function   Creatinine 1.80 >> 1.60 today  Essential hypertension Presented with hypertensive emergency with pulmonary edema and CHF decompensation.  BP now controlled but with intermittent hypotension.  --Coreg 25 mg BID --Imdur 30 mg daily --Irbesartan 75 mg daily --Hydralazine 10 mg IV PRN -- Cardiology added 5 mg amlodipine  BP is quite labile ranging in past 24 hours from 101/71 to 181/75        Subjective: Patient awake resting in bed, seen after right heart cath this AM.  Reports feeling okay.  No chest pain or SOB.    Physical Exam: Vitals:   11/25/21 0930 11/25/21 0940 11/25/21 1138 11/25/21 1718  BP: (!) 190/83 (!) 185/83 (!) 142/70 135/72  Pulse: 68 67 65 63  Resp: 15 16 18 16   Temp:   97.9 F (36.6 C) 99.1 F (37.3 C)  TempSrc:   Oral Oral  SpO2: 95% 96% 100% 100%  Weight:      Height:       General exam: Awake and alert, no acute distress, morbidly obese HEENT: Hearing grossly normal, moist mucous membranes Respiratory system: Lungs clear but diminished due to body habitus, no wheezes or rhonchi, on 2 L/min [name] O2 Cardiovascular system: Regular rate and rhythm, no lower extremity edema Central nervous system: A&O x3. no gross focal neurologic deficits, normal speech Extremities: moves all, normal tone Psychiatry: normal mood, congruent  affect, judgement and insight appear normal   Data Reviewed: Notable labs: CO2 35, NUM 21, Cr improved 1.41, GFR 45,   Right Heart Cath 7/3 -- "showed evidence of severely elevated right and left-sided filling pressures, severe pulmonary hypertension and normal cardiac output.   RA: 22 mmHg RV: 92/18 with an end-diastolic pressure of 32 mmHg. PA: 95/44 with a mean of 64 mmHg PCW: 30 mmHg  Cardiac output: 7.4 with an index of 3.16. Pulmonary vascular resistance: 4.5 Woods units"    Family Communication: None  Disposition: Status is: Inpatient Remains inpatient appropriate because: Placed on Lasix drip per cardiology after RHC today showed significant volume overload   Planned Discharge Destination: Home    Time spent: 35 minutes  Author: , DO 11/25/2021 5:54 PM  For on call review [email or web address].

## 2021-11-25 NOTE — Interval H&P Note (Signed)
History and Physical Interval Note:  11/25/2021 8:39 AM  Sheila Hays  has presented today for surgery, with the diagnosis of Acute on chronic heart failure with preserved ejection fraction.  The various methods of treatment have been discussed with the patient and family. After consideration of risks, benefits and other options for treatment, the patient has consented to  Procedure(s): RIGHT HEART CATH (N/A) as a surgical intervention.  The patient's history has been reviewed, patient examined, no change in status, stable for surgery.  I have reviewed the patient's chart and labs.  Questions were answered to the patient's satisfaction.     Lorine Bears

## 2021-11-25 NOTE — Care Management Important Message (Signed)
Important Message  Patient Details  Name: Sheila Hays MRN: [id number] Date of Birth: 1970-08-29   Medicare Important Message Given:  Yes     [name] 11/25/2021, 12:00 PM

## 2021-11-26 DIAGNOSIS — J9601 Acute respiratory failure with hypoxia: Secondary | ICD-10-CM | POA: Diagnosis not present

## 2021-11-26 DIAGNOSIS — I5033 Acute on chronic diastolic (congestive) heart failure: Secondary | ICD-10-CM | POA: Diagnosis not present

## 2021-11-26 DIAGNOSIS — N1831 Chronic kidney disease, stage 3a: Secondary | ICD-10-CM | POA: Diagnosis not present

## 2021-11-26 DIAGNOSIS — I161 Hypertensive emergency: Secondary | ICD-10-CM

## 2021-11-26 LAB — BASIC METABOLIC PANEL
Anion gap: 8 (ref 5–15)
BUN: 22 mg/dL — ABNORMAL HIGH (ref 6–20)
CO2: 38 mmol/L — ABNORMAL HIGH (ref 22–32)
Calcium: 9.2 mg/dL (ref 8.9–10.3)
Chloride: 95 mmol/L — ABNORMAL LOW (ref 98–111)
Creatinine, Ser: 1.46 mg/dL — ABNORMAL HIGH (ref 0.44–1.00)
GFR, Estimated: 43 mL/min — ABNORMAL LOW (ref >60)
Glucose, Bld: 81 mg/dL (ref 70–99)
Potassium: 3.5 mmol/L (ref 3.5–5.1)
Sodium: 141 mmol/L (ref 135–145)

## 2021-11-26 LAB — MAGNESIUM: Magnesium: 2 mg/dL (ref 1.7–2.4)

## 2021-11-26 MED ORDER — POTASSIUM CHLORIDE CRYS ER 20 MEQ PO TBCR
40.0000 meq | EXTENDED_RELEASE_TABLET | Freq: Once | ORAL | Status: AC
  Administered 2021-11-26: 40 meq via ORAL
  Filled 2021-11-26: qty 2

## 2021-11-26 NOTE — Progress Notes (Signed)
On call notified of pt report of chest pain 6/10. PT reported 8/10 prior to prn relief being administered. HR 52, BP 150/66. No new orders at this time. Will continue to monitor

## 2021-11-26 NOTE — Progress Notes (Signed)
Progress Note  Patient Name: Sheila Hays Date of Encounter: 11/26/2021  [facility] Cardiologist: Peter [place], MD   Subjective   Laying supine in bed on nasal cannula oxygen 2 to 3 L Reports having good urine output, every hour on the hour overnight, now every 2 hours Remains on Lasix infusion at 4 mg/h Discussed catheterization results with her from yesterday showing severe pulmonary hypertension, markedly elevated wedge pressures She denies leg swelling, no abdominal fullness, has not been ambulating as she is on oxygen Blood pressure continues to run high 150/80  Inpatient Medications    Scheduled Meds:  amLODipine  5 mg Oral Daily   vitamin C  500 mg Oral Daily   aspirin EC  81 mg Oral Daily   atorvastatin  40 mg Oral q1800   carvedilol  25 mg Oral BID WC   empagliflozin  10 mg Oral Daily   enoxaparin (LOVENOX) injection  0.5 mg/kg Subcutaneous Q24H   escitalopram  20 mg Oral q morning   folic acid  500 mcg Oral Daily   irbesartan  75 mg Oral Daily   isosorbide mononitrate  30 mg Oral Daily   lamoTRIgine  25 mg Oral BID   levETIRAcetam  500 mg Oral BID   mometasone-formoterol  2 puff Inhalation BID   oxcarbazepine  600 mg Oral BID   pantoprazole  40 mg Oral Daily   sodium chloride flush  3 mL Intravenous Q12H   sodium chloride flush  3 mL Intravenous Q12H   Continuous Infusions:  sodium chloride     furosemide (LASIX) 200 mg in dextrose 5 % 100 mL (2 mg/mL) infusion 4 mg/hr (11/25/21 1245)   PRN Meds: sodium chloride, albuterol, butalbital-acetaminophen-caffeine, HYDROcodone-acetaminophen, linaclotide, LORazepam, ondansetron **OR** ondansetron (ZOFRAN) IV, mouth rinse, senna-docusate, sodium chloride flush, traZODone   Vital Signs    Vitals:   11/26/21 0349 11/26/21 0717 11/26/21 1214 11/26/21 1223  BP: (!) 157/61 (!) 151/67 (!) 150/82   Pulse: 60 65 63   Resp: 17 16 12    Temp: 98.2 F (36.8 C) 98.1 F (36.7 C) 97.6 F (36.4 C)   TempSrc: Oral  Oral Oral   SpO2: 97% 92% (!) 83% 94%  Weight: 128.2 kg     Height:        Intake/Output Summary (Last 24 hours) at 11/26/2021 1334 Last data filed at 11/25/2021 1735 Gross per 24 hour  Intake 7 ml  Output 500 ml  Net -493 ml      11/26/2021    3:49 AM 11/25/2021    3:00 AM 11/24/2021    4:03 AM  Last 3 Weights  Weight (lbs) 282 lb 10.1 oz 290 lb 5.5 oz 288 lb 12.8 oz  Weight (kg) 128.2 kg 131.7 kg 131 kg      Telemetry    Normal sinus rhythm- Personally Reviewed  ECG     - Personally Reviewed  Physical Exam   GEN: No acute distress.  Obese Neck:  JVD 10+ Cardiac: RRR, no murmurs, rubs, or gallops.  Respiratory: Clear to auscultation bilaterally. GI: Soft, nontender, non-distended  MS: No edema; No deformity. Neuro:  Nonfocal  Psych: Normal affect   Labs    High Sensitivity Troponin:   Recent Labs  Lab 11/20/21 1352 11/20/21 1634 11/21/21 1019 11/23/21 0942  TROPONINIHS 23* 21* 28* 18*     Chemistry Recent Labs  Lab 11/22/21 0551 11/23/21 01/24/22 11/24/21 0526 11/25/21 0520 11/26/21 0538  NA 143   < > 140  144 141  K 4.0   < > 4.1 4.2 3.5  CL 102   < > 101 101 95*  CO2 33*   < > 34* 35* 38*  GLUCOSE 72   < > 102* 83 81  BUN 30*   < > 25* 21* 22*  CREATININE 1.80*   < > 1.57* 1.41* 1.46*  CALCIUM 8.7*   < > 8.5* 8.9 9.2  MG 2.1  --   --   --  2.0  GFRNONAA 33*   < > 39* 45* 43*  ANIONGAP 8   < > 5 8 8    < > = values in this interval not displayed.    Lipids No results for input(s): "CHOL", "TRIG", "HDL", "LABVLDL", "LDLCALC", "CHOLHDL" in the last 168 hours.  Hematology Recent Labs  Lab 11/20/21 1352 11/21/21 0518  WBC 4.3 5.6  RBC 4.38 4.49  HGB 11.7* 11.9*  HCT 38.5 38.9  MCV 87.9 86.6  MCH 26.7 26.5  MCHC 30.4 30.6  RDW 16.6* 16.1*  PLT 222 247   Thyroid No results for input(s): "TSH", "FREET4" in the last 168 hours.  BNP Recent Labs  Lab 11/20/21 1352  BNP 580.8*    DDimer No results for input(s): "DDIMER" in the last 168 hours.    Radiology    CARDIAC CATHETERIZATION  Result Date: 11/25/2021   Hemodynamic findings consistent with severe pulmonary hypertension. Successful right heart catheterization via the right antecubital vein. This showed evidence of severely elevated right and left-sided filling pressures, severe pulmonary hypertension and normal cardiac output. RA: 22 mmHg RV: 92/18 with an end-diastolic pressure of 32 mmHg. PA: 95/44 with a mean of 64 mmHg PCW: 30 mmHg Cardiac output: 7.4 with an index of 3.16. Pulmonary vascular resistance: 4.5 Woods units Recommendations: The patient is significantly volume overloaded. Pulmonary hypertension is severe and seems to be of a mixed venous and arterial etiology. We will start the patient on furosemide drip and the dose should be uptitrated based on urine output response.  Diuresis might be difficult due to the presence of severe pulmonary hypertension and likely RV dysfunction.  If renal function worsens with diuresis, she might require inotropic therapy for RV support and consideration to transfer to an advanced heart failure service.    Cardiac Studies  Echocardiogram November 21, 2021  1. Left ventricular ejection fraction, by estimation, is 55 to 60%. The  left ventricle has normal function. The left ventricle has no regional  wall motion abnormalities. The left ventricular internal cavity size was  mildly dilated. There is mild left  ventricular hypertrophy. Left ventricular diastolic function could not be  evaluated.   2. Right ventricular systolic function is normal. The right ventricular  size is not well visualized.   3. Left atrial size was mild to moderately dilated.   4. The mitral valve is normal in structure. No evidence of mitral valve  regurgitation.   5. The aortic valve was not well visualized. Aortic valve regurgitation  is not visualized.   6. The inferior vena cava is normal in size with <50% respiratory  variability, suggesting right atrial  pressure of 8 mmHg.    Patient Profile     51 y.o. female with history of  HFimpEF, CKD stage III, seizure disorder, uncontrolled HTN, and GERD who is being seen today for the evaluation of volume overload   Assessment & Plan    1.  Acute on chronic diastolic heart failure with severe pulmonary hypertension  -Prior  history of cardiomyopathy EF 25-30% in 2016,  Most recent EF last week 55-60% Right heart catheterization this admission showed severely elevated right and left-sided filling pressures, severe pulmonary hypertension and normal cardiac output. -Placed on Lasix infusion, would continue infusion for now, up to 6 mg -continue carvedilol 25 mg BID, irbesartan 75 mg daily, imdur 30 mg daily empagliflozin 10 mg daily If renal function worsens with diuresis, she might require inotropic therapy for RV support and consideration for transfer to advanced heart failure service.   2.  Atypical chest pain Elevated troponin, consistent with demand ischemia Hypertensive urgency on admission -continue aspirin 81 mg daily, atorvastatin 40 mg daily  outpatient coronary CTA vs PET-CT could be considered   3.  Hypoxic respiratory failure, likely due to underlying obesity hypoventilation syndrome as well as chronic diastolic heart failure and chronic volume overload. -Would benefit from sleep study   4.  Severe pulmonary hypertension:  In the setting of chronic diastolic heart failure as well as obesity hypoventilation syndrome.   Continue aggressive diuresis with Lasix infusion   Total encounter time more than 50 minutes  Greater than 50% was spent in counseling and coordination of care with the patient   For questions or updates, please contact [place] Please consult [email or web address] for contact info under        Signed, Ida Rogue, MD  11/26/2021, 1:34 PM

## 2021-11-26 NOTE — Progress Notes (Addendum)
Progress Note   Patient: Sheila Hays ZOX:[id number] DOB: 02-21-1971 DOA: 11/20/2021     5 DOS: the patient was seen and examined on 11/26/2021   Brief hospital course: Sheila Hays is a 51 year old female with morbid obesity, hypertension, hyperlipidemia, chronic HFpEF, CKD 3B, who presented to the ED on 11/20/2021 for evaluation of shortness of breath and chest pain.  Initial blood pressure 202/90 in the ED with otherwise normal vitals, SPO2 of 95% on room air.  Labs were mostly unremarkable but BNP elevated 580.8.  Chest x-ray with cardiomegaly, prominent central pulmonary vessels concerning for CHF and sepsis small/minimal bilateral pleural effusions  Blood pressure was treated with Vasotec 0.625 mg IV in the ED, also given furosemide 40 mg IV.  Admitted to the hospital for further evaluation management of acute on chronic HFpEF requiring IV diuresis, close monitoring and management of hypertensive urgency.  Assessment and Plan: * Acute on chronic heart failure with preserved ejection fraction (HFpEF) (HCC) Presented with shortness of breath and chest pain, orthopnea.  Started on IV Lasix with good response. Echo on showed EF 55 to 60%, diagnostic function could not be evaluated, mild LVH, left atrium mild to moderately dilated. -- Cardiology consulted --Cardiology starting Lasix drip after RHC today showed volume overload, severe pulmonary HTN --Lasix drip increased to 6 mg/h today --Continue Coreg, irbesartan, Imdur, empagliflozin --Monitor renal function electrolytes --40 mEq oral potassium today for K3.5 and on Lasix drip -- Strict I/O's and daily weights   Hypertensive emergency Initial BP 202/90, resulting in pulmonary edema and decompensated CHF. -- Renal artery duplex U/S limited, showed mildly elevated velocities at origin of renal arteries but could not be compared to aortic velocities due to bowel gas --MRA or CTA recommended if high clinical suspicion for  RAS Currently lower suspicion for RAS as compliance with meds since admission BP is controlled.  Not resistant hypertension. -- See essential hypertension  Pulmonary hypertension (HCC) Likely related to untreated OHS/OSA, morbid obesity.  Right Heart Cath on 7/3 --showed evidence of severely elevated right and left-sided filling pressures, severe pulmonary hypertension and normal cardiac output. 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  A.m. VBG on 7/2 with pH of 7.29, PCO2 of 85, HCO3 40.9 and PO2 of 70. -- Outpatient sleep study -- Trial overnight BiPAP -- Patient likely needs home NIV  Acute respiratory failure with hypoxia [facility]) Patient presented with hypertensive heart failure and dyspnea.  Despite diuresis she continues to require oxygen.  Qualifies for home oxygen at 2 L/min.  Suspect due to underlying OHS, ?pulmonary HTN.   --Cardiology planning for right heart cath tomorrow --Home O2 ordered -- Supplemental O2, maintain sats >90% --Sleep study recommended  Dizziness 7/1 - with BP control improved, patient having significant dizziness when up to ambulate.  She has had some soft BP's 99/52, 112/57.  Suspect orthostatic hypotension vs pt used to high baseline BP.  Chest pain Atypical, reproducible on palpation, likely musculoskeletal etiology vs esophageal etiology.  Troponin very minimally elevated with flat trend and EKG is nonacute. Most likely demand ischemia. Not consistent with ACS. 7/1 - recurrent bout this AM, trop down to 18 from 28 and EKG remains normal. --Cardiology following --Nitro paste discontinued -- Ischemic evaluation as outpatient  Morbid obesity (HCC) Body mass index is 46.9 kg/m. Complicates overall care and prognosis.  Recommend lifestyle modifications including physical activity and diet for weight loss and overall long-term health.  Highly suspect  OSA and/or OHS. Recommend sleep study as  outpatient. Qualifies for home O2 here.   Complex partial seizure disorder (HCC) - Resumed ox carbamazepine 600 mg p.o. twice daily; Keppra 500 mg p.o. twice daily - Per med reconciliation, patient is supposed to be taking Lamictal 25 mg tablet, 4 pills twice daily however on admission patient reported she is taking it 25 mg p.o. twice daily --Resumed on Lamictal at 25 mg p.o. twice daily because lamictal requires gradual increase dosing -Follow-up with primary care and/or neurology for further recommendations regarding Lamictal dosing - Ativan 2 mg IV as needed for seizure, 2 doses ordered  Chronic kidney disease, stage 3a (HCC) Renal function near baseline on admission up trended with IV diuresis. Slowly improving since Lasix stopped. --Monitor renal function   Creatinine 1.80 >> 1.60 today  Essential hypertension Presented with hypertensive emergency with pulmonary edema and CHF decompensation.  BP now controlled but with intermittent hypotension.  --Coreg 25 mg BID --Imdur 30 mg daily --Irbesartan 75 mg daily --Hydralazine 10 mg IV PRN -- Cardiology added 5 mg amlodipine  BP is quite labile ranging in past 24 hours from 101/71 to 181/75        Subjective: Patient was awake sitting up in bed when seen on rounds, talking to her husband on FaceTime.  She reports overall feeling better.  Reports good urine output with Lasix infusion.  Has not had episodes of chest pain the past 24 hours.  Reports dizziness when up to ambulate to the bathroom.  This has been an issue the past few days.  No other acute complaints at this time.  Physical Exam: Vitals:   11/26/21 0717 11/26/21 1214 11/26/21 1223 11/26/21 1553  BP: (!) 151/67 (!) 150/82  (!) 116/54  Pulse: 65 63  62  Resp: 16 12  19   Temp: 98.1 F (36.7 C) 97.6 F (36.4 C)  98.5 F (36.9 C)  TempSrc: Oral Oral  Oral  SpO2: 92% (!) 83% 94% 100%  Weight:      Height:       General exam: Awake and alert, no acute distress,  morbidly obese HEENT: Hearing grossly normal, moist mucous membranes Respiratory system: Lungs clear but diminished due to body habitus, no wheezes or rhonchi, on 2 L/min [place] O2 Cardiovascular system: Regular rate and rhythm, no lower extremity edema Central nervous system: A&O x3. no gross focal neurologic deficits, normal speech Extremities: moves all, normal tone Psychiatry: normal mood, congruent affect, judgement and insight appear normal   Data Reviewed: Notable labs: Chloride 95, potassium 3.5, bicarb 38, BUN 25, creatinine 1.46 from 1.41, mag 2.0    Family Communication: None  Disposition: Status is: Inpatient Remains inpatient appropriate because: Severity of illness with volume overload, remains on Lasix drip per cardiology   Planned Discharge Destination: Home    Time spent: 35 minutes  Author: , DO 11/26/2021 4:35 PM  For on call review [email or web address].

## 2021-11-27 DIAGNOSIS — I5033 Acute on chronic diastolic (congestive) heart failure: Secondary | ICD-10-CM | POA: Diagnosis not present

## 2021-11-27 DIAGNOSIS — I10 Essential (primary) hypertension: Secondary | ICD-10-CM | POA: Diagnosis not present

## 2021-11-27 LAB — BASIC METABOLIC PANEL
Anion gap: 14 (ref 5–15)
BUN: 28 mg/dL — ABNORMAL HIGH (ref 6–20)
CO2: 39 mmol/L — ABNORMAL HIGH (ref 22–32)
Calcium: 9.7 mg/dL (ref 8.9–10.3)
Chloride: 91 mmol/L — ABNORMAL LOW (ref 98–111)
Creatinine, Ser: 1.66 mg/dL — ABNORMAL HIGH (ref 0.44–1.00)
GFR, Estimated: 37 mL/min — ABNORMAL LOW (ref >60)
Glucose, Bld: 83 mg/dL (ref 70–99)
Potassium: 3.3 mmol/L — ABNORMAL LOW (ref 3.5–5.1)
Sodium: 144 mmol/L (ref 135–145)

## 2021-11-27 LAB — MAGNESIUM: Magnesium: 2 mg/dL (ref 1.7–2.4)

## 2021-11-27 MED ORDER — POTASSIUM CHLORIDE CRYS ER 20 MEQ PO TBCR
40.0000 meq | EXTENDED_RELEASE_TABLET | Freq: Once | ORAL | Status: AC
  Administered 2021-11-27: 40 meq via ORAL
  Filled 2021-11-27: qty 2

## 2021-11-27 NOTE — Progress Notes (Addendum)
PROGRESS NOTE    Jmya Uliano  VOZ:[id number] DOB: 02-25-1971  DOA: 11/20/2021 Date of Service: 11/27/21 PCP: [facility]     Brief Narrative / Hospital Course:  Laurenashley Viar is a 51 year old female with morbid obesity, hypertension, hyperlipidemia, chronic HFpEF, CKD 3B, who presented to the ED on 11/20/2021 for evaluation of shortness of breath and chest pain. Initial blood pressure 202/90 in the ED with otherwise normal vitals, SPO2 of 95% on room air.  Labs were mostly unremarkable but BNP elevated 580.8.  Chest x-ray with cardiomegaly, prominent central pulmonary vessels concerning for CHF and sepsis small/minimal bilateral pleural effusions. Blood pressure was treated with Vasotec 0.625 mg IV in the ED, also given furosemide 40 mg IV. 11/20/21: Admitted to the hospital for further evaluation management of acute on chronic HFpEF requiring IV diuresis, close monitoring and management of hypertensive urgency. 06/29-07/04: Underwent RHC 11/25/2021, severe pulmonary HTN, requiring lasix gtt. HTN improved. Trial BiPap for hypercapnic acidosis likely d/t OHS, plan for outpatient sleep study and likley needs home NIV 07/05: cardiology reduced lasix gtt from 6 to 4 mg/h, plan transition to po torsemide when euvolemic / if Cr worsens  Consultants:  Cardiology   Procedures: 11/25/2021 R heart catheterization     Subjective: Patient reports fatigue, no SOB/CP. RN reports patient fell in bathroom, no head trauma or new pain.     ASSESSMENT & PLAN:   Principal Problem:   Acute on chronic heart failure with preserved ejection fraction (HFpEF) (HCC) Active Problems:   Hypertensive emergency   Essential hypertension   Chronic kidney disease, stage 3a (HCC)   Complex partial seizure disorder (HCC)   Morbid obesity (HCC)   Chest pain   Dizziness   Acute respiratory failure with hypoxia (HCC)   Hypercapnic acidosis   Pulmonary hypertension (HCC)   Acute on  chronic heart failure with preserved ejection fraction (HFpEF) (HCC), POA IV Lasix with good response. Echo on showed EF 55 to 60%, diastolic function could not be evaluated, mild LVH, left atrium mild to moderately dilated. Cardiology consulted --> 07/04 Lasix drip after RHC showed volume overload, severe pulmonary HTN Lasix drip increased to 6 mg/h today Continue Coreg, irbesartan, Imdur, empagliflozin Strict I/O's and daily weights     Hypertensive emergency - resolved Initial BP 202/90, resulting in pulmonary edema and decompensated CHF. Renal artery duplex U/S limited, showed mildly elevated velocities at origin of renal arteries but could not be compared to aortic velocities due to bowel gas MRA or CTA recommended if high clinical suspicion for RAS Currently lower suspicion for RAS as compliance with meds since admission BP is controlled.  Not resistant hypertension.   Pulmonary hypertension (HCC) Likely related to untreated OHS/OSA, morbid obesity.  Right Heart Cath on 7/3 --showed evidence of severely elevated right and left-sided filling pressures, severe pulmonary hypertension and normal cardiac output. 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  A.m. VBG on 7/2 with pH of 7.29, PCO2 of 85, HCO3 40.9 and PO2 of 70. Outpatient sleep study Trial overnight BiPAP Patient likely needs home NIV   Acute respiratory failure with hypoxia (HCC) - resolved Patient presented with hypertensive heart failure and dyspnea.  Despite diuresis she continues to require oxygen.  Qualifies for home oxygen at 2 L/min.  Suspect due to underlying OHS, pulmonary HTN.   Home O2 ordered Supplemental O2, maintain sats >90% Sleep study recommended outpatient   Dizziness 7/1 -  with BP control improved, patient having significant dizziness when up to ambulate.  She has had some soft BP's 99/52, 112/57.  Suspect orthostatic hypotension vs pt used to  high baseline BP. Fall today, see RN notes    Chest pain Atypical, reproducible on palpation, likely musculoskeletal etiology vs esophageal etiology.  Troponin very minimally elevated with flat trend and EKG is nonacute. Most likely demand ischemia. Not consistent with ACS. 7/1 - recurrent bout this AM, trop down to 18 from 28 and EKG remains normal. Cardiology following Nitro paste discontinued Ischemic evaluation as outpatient   Morbid obesity (HCC) Body mass index is 46.9 kg/m. Complicates overall care and prognosis.  Recommend lifestyle modifications including physical activity and diet for weight loss and overall long-term health.   Highly suspect OSA and/or OHS. Recommend sleep study as outpatient. Qualifies for home O2 here.   Complex partial seizure disorder (HCC) Resumed oxcarbamazepine 600 mg p.o. twice daily; Keppra 500 mg p.o. twice daily Resumed on Lamictal at 25 mg p.o. twice daily because lamictal requires gradual increase dosing Follow-up with primary care and/or neurology for further recommendations regarding Lamictal dosing Ativan 2 mg IV as needed for seizure, 2 doses ordered   Chronic kidney disease, stage 3a (HCC) Renal function near baseline on admission up trended with IV diuresis. Slowly improving since Lasix stopped.    Essential hypertension Presented with hypertensive emergency with pulmonary edema and CHF decompensation.  BP now controlled but with intermittent hypotension.  --Coreg 25 mg BID --Imdur 30 mg daily --Irbesartan 75 mg daily --Hydralazine 10 mg IV PRN -- Cardiology added 5 mg amlodipine BP is quite labile ranging in past 24 hours from 101/71 to 181/75      DVT prophylaxis: Lovenox Code Status: FULL Family Communication: pt declined my offer today to call family / support person(s) Disposition Plan / TOC needs: likely back to previous home environment, may need PT/OT eval for HH vs SNF Barriers to discharge / significant pending  items: cardiology recs appreciated re: goals to come off lasix gtt and home rx going forward              Objective: Vitals:   11/26/21 2305 11/27/21 0354 11/27/21 0738 11/27/21 1427  BP: 112/62 135/61 122/63 (!) 93/49  Pulse: 62 64 62 61  Resp: 18 19 18 16   Temp: (!) 97.5 F (36.4 C) 97.8 F (36.6 C) 97.7 F (36.5 C) 98.2 F (36.8 C)  TempSrc: Oral Oral Oral Oral  SpO2: 96% 94% 95% 90%  Weight:      Height:        Intake/Output Summary (Last 24 hours) at 11/27/2021 1521 Last data filed at 11/27/2021 1137 Gross per 24 hour  Intake 660.91 ml  Output 2000 ml  Net -1339.09 ml   Filed Weights   11/24/21 0403 11/25/21 0300 11/26/21 0349  Weight: 131 kg 131.7 kg 128.2 kg    Examination:  Constitutional:  VS as above General Appearance: alert, well-developed, well-nourished, NAD Eyes: Normal lids and conjunctive, non-icteric sclera Ears, Nose, Mouth, Throat: Normal appearance Neck: No masses, trachea midline No thyroid enlargement/tenderness/mass appreciated Respiratory: Normal respiratory effort Breath sounds normal, no wheeze/rhonchi/rales Cardiovascular: S1/S2 normal, no murmur/rub/gallop auscultated Trace lower extremity edema Gastrointestinal: Nontender, no masses Musculoskeletal:  No clubbing/cyanosis of digits Neurological: No cranial nerve deficit on limited exam Motor and sensation intact and symmetric Psychiatric: Normal judgment/insight Normal mood and affect       Scheduled Medications:   amLODipine  5 mg Oral Daily  vitamin C  500 mg Oral Daily   aspirin EC  81 mg Oral Daily   atorvastatin  40 mg Oral q1800   carvedilol  25 mg Oral BID WC   empagliflozin  10 mg Oral Daily   enoxaparin (LOVENOX) injection  0.5 mg/kg Subcutaneous Q24H   escitalopram  20 mg Oral q morning   folic acid  [id number] mcg Oral Daily   irbesartan  75 mg Oral Daily   isosorbide mononitrate  30 mg Oral Daily   lamoTRIgine  25 mg Oral BID   levETIRAcetam  500 mg  Oral BID   mometasone-formoterol  2 puff Inhalation BID   oxcarbazepine  600 mg Oral BID   pantoprazole  40 mg Oral Daily   sodium chloride flush  3 mL Intravenous Q12H   sodium chloride flush  3 mL Intravenous Q12H    Continuous Infusions:  sodium chloride     furosemide (LASIX) 200 mg in dextrose 5 % 100 mL (2 mg/mL) infusion 4 mg/hr (11/27/21 1137)    PRN Medications:  sodium chloride, albuterol, butalbital-acetaminophen-caffeine, HYDROcodone-acetaminophen, linaclotide, LORazepam, ondansetron **OR** ondansetron (ZOFRAN) IV, mouth rinse, senna-docusate, sodium chloride flush, traZODone  Antimicrobials:  Anti-infectives (From admission, onward)    None       Data Reviewed: I have personally reviewed following labs and imaging studies  CBC: Recent Labs  Lab 11/21/21 0518  WBC 5.6  HGB 11.9*  HCT 38.9  MCV 86.6  PLT [id number]   Basic Metabolic Panel: Recent Labs  Lab 11/22/21 0551 11/23/21 0621 11/24/21 0526 11/25/21 0520 11/26/21 0538 11/27/21 0551  NA 143 142 140 144 141 144  K 4.0 3.9 4.1 4.2 3.5 3.3*  CL 102 102 101 101 95* 91*  CO2 33* 33* 34* 35* 38* 39*  GLUCOSE 72 77 102* 83 81 83  BUN 30* 30* 25* 21* 22* 28*  CREATININE 1.80* 1.60* 1.57* 1.41* 1.46* 1.66*  CALCIUM 8.7* 8.6* 8.5* 8.9 9.2 9.7  MG 2.1  --   --   --  2.0 2.0   GFR: Estimated Creatinine Clearance: 54.4 mL/min (A) (by C-G formula based on SCr of 1.66 mg/dL (H)). Liver Function Tests: No results for input(s): "AST", "ALT", "ALKPHOS", "BILITOT", "PROT", "ALBUMIN" in the last 168 hours. No results for input(s): "LIPASE", "AMYLASE" in the last 168 hours. No results for input(s): "AMMONIA" in the last 168 hours. Coagulation Profile: No results for input(s): "INR", "PROTIME" in the last 168 hours. Cardiac Enzymes: No results for input(s): "CKTOTAL", "CKMB", "CKMBINDEX", "TROPONINI" in the last 168 hours. BNP (last 3 results) No results for input(s): "PROBNP" in the last 8760 hours. HbA1C: No  results for input(s): "HGBA1C" in the last 72 hours. CBG: Recent Labs  Lab 11/24/21 2023 11/25/21 0721 11/25/21 1139  GLUCAP 112* 89 84   Lipid Profile: No results for input(s): "CHOL", "HDL", "LDLCALC", "TRIG", "CHOLHDL", "LDLDIRECT" in the last 72 hours. Thyroid Function Tests: No results for input(s): "TSH", "T4TOTAL", "FREET4", "T3FREE", "THYROIDAB" in the last 72 hours. Anemia Panel: No results for input(s): "VITAMINB12", "FOLATE", "FERRITIN", "TIBC", "IRON", "RETICCTPCT" in the last 72 hours. Urine analysis:    Component Value Date/Time   COLORURINE YELLOW (A) 06/14/2021 1830   APPEARANCEUR CLEAR (A) 06/14/2021 1830   LABSPEC 1.011 06/14/2021 1830   PHURINE 5.0 06/14/2021 1830   GLUCOSEU NEGATIVE 06/14/2021 1830   HGBUR NEGATIVE 06/14/2021 1830   BILIRUBINUR NEGATIVE 06/14/2021 1830   KETONESUR NEGATIVE 06/14/2021 1830   PROTEINUR 100 (A) 06/14/2021 1830   NITRITE  NEGATIVE 06/14/2021 1830   LEUKOCYTESUR LARGE (A) 06/14/2021 1830   Sepsis Labs: @LABRCNTIP (procalcitonin:4,lacticidven:4)  No results found for this or any previous visit (from the past 240 hour(s)).       Radiology Studies last 96 hours: CARDIAC CATHETERIZATION  Result Date: 11/25/2021   Hemodynamic findings consistent with severe pulmonary hypertension. Successful right heart catheterization via the right antecubital vein. This showed evidence of severely elevated right and left-sided filling pressures, severe pulmonary hypertension and normal cardiac output. RA: 22 mmHg RV: 92/18 with an end-diastolic pressure of 32 mmHg. PA: 95/44 with a mean of 64 mmHg PCW: 30 mmHg Cardiac output: 7.4 with an index of 3.16. Pulmonary vascular resistance: 4.5 Woods units Recommendations: The patient is significantly volume overloaded. Pulmonary hypertension is severe and seems to be of a mixed venous and arterial etiology. We will start the patient on furosemide drip and the dose should be uptitrated based on urine output  response.  Diuresis might be difficult due to the presence of severe pulmonary hypertension and likely RV dysfunction.  If renal function worsens with diuresis, she might require inotropic therapy for RV support and consideration to transfer to an advanced heart failure service.            LOS: 6 days    Emeterio Reeve, DO [facility] 11/27/2021, 3:21 PM   Staff may message me via secure chat in [place]  but this may not receive immediate response,  please page for urgent matters!  If 7PM-7AM, please contact night-coverage [email or web address]  Dictation software was used to generate the above note. Typos may occur and escape review, as with typed/written notes. Please contact Dr Sheppard Coil directly for clarity if needed.

## 2021-11-27 NOTE — Progress Notes (Signed)
Was the fall witnessed: No  Patient condition before and after the fall: Pt was A+Ox4, ambulating ind, post fall pt A+OX4, bed alarm is on and mats are down for safety.   Patient's reaction to the fall: Shock, she stated she thought she was able to walk to the bathroom, but then felt lightheaded and fell.   Name of the doctor that was notified including date and time: Dr. Lyn Hollingshead, approximately 14:30  Any interventions and vital signs: VS checked q 1hr, neuro's in 1 hour then if okay recheck in 4 hours. CT no ordered but if any changes a CT will be ordered.

## 2021-11-27 NOTE — Progress Notes (Signed)
Progress Note  Patient Name: Sheila Hays Date of Encounter: 11/27/2021  Primary Cardiologist: New to [facility] - consult by Agbor-Etang (previously seen inpatient a [facility] in [place])  Subjective   Dyspnea slowly improving. Continues to note intermittent, randomly occurring chest pain that is self limiting. Documented UOP 2.1 L for the past 24 hours, net - 3.2 L to date. Slight increase in BUN/SCr from 22/1.46 to 28/1.66 (consistent with prior readings throughout the admission).  Inpatient Medications    Scheduled Meds:  amLODipine  5 mg Oral Daily   vitamin C  500 mg Oral Daily   aspirin EC  81 mg Oral Daily   atorvastatin  40 mg Oral q1800   carvedilol  25 mg Oral BID WC   empagliflozin  10 mg Oral Daily   enoxaparin (LOVENOX) injection  0.5 mg/kg Subcutaneous Q24H   escitalopram  20 mg Oral q morning   folic acid  [id number] mcg Oral Daily   irbesartan  75 mg Oral Daily   isosorbide mononitrate  30 mg Oral Daily   lamoTRIgine  25 mg Oral BID   levETIRAcetam  500 mg Oral BID   mometasone-formoterol  2 puff Inhalation BID   oxcarbazepine  600 mg Oral BID   pantoprazole  40 mg Oral Daily   sodium chloride flush  3 mL Intravenous Q12H   sodium chloride flush  3 mL Intravenous Q12H   Continuous Infusions:  sodium chloride     furosemide (LASIX) 200 mg in dextrose 5 % 100 mL (2 mg/mL) infusion 6 mg/hr (11/27/21 0353)   PRN Meds: sodium chloride, albuterol, butalbital-acetaminophen-caffeine, HYDROcodone-acetaminophen, linaclotide, LORazepam, ondansetron **OR** ondansetron (ZOFRAN) IV, mouth rinse, senna-docusate, sodium chloride flush, traZODone   Vital Signs    Vitals:   11/26/21 2147 11/26/21 2305 11/27/21 0354 11/27/21 0738  BP: (!) 150/66 112/62 135/61 122/63  Pulse: 61 62 64 62  Resp:  18 19 18   Temp:  (!) 97.5 F (36.4 C) 97.8 F (36.6 C) 97.7 F (36.5 C)  TempSrc:  Oral Oral Oral  SpO2: 92% 96% 94% 95%  Weight:      Height:        Intake/Output  Summary (Last 24 hours) at 11/27/2021 1051 Last data filed at 11/27/2021 0900 Gross per 24 hour  Intake 164.11 ml  Output 2200 ml  Net -2035.89 ml   Filed Weights   11/24/21 0403 11/25/21 0300 11/26/21 0349  Weight: 131 kg 131.7 kg 128.2 kg    Telemetry    SR, 60s bpm - Personally Reviewed  ECG    No new tracings - Personally Reviewed  Physical Exam   GEN: No acute distress.   Neck: JVD difficult to assess secondary to body habitus. Cardiac: RRR, no murmurs, rubs, or gallops.  Respiratory: Diminished breath sounds bilaterally along the bases.  GI: Soft, nontender, non-distended.   MS: Trace pretibial edema; No deformity. Neuro:  Alert and oriented x 3; Nonfocal.  Psych: Normal affect.  Labs    Chemistry Recent Labs  Lab 11/25/21 0520 11/26/21 0538 11/27/21 0551  NA 144 141 144  K 4.2 3.5 3.3*  CL 101 95* 91*  CO2 35* 38* 39*  GLUCOSE 83 81 83  BUN 21* 22* 28*  CREATININE 1.41* 1.46* 1.66*  CALCIUM 8.9 9.2 9.7  GFRNONAA 45* 43* 37*  ANIONGAP 8 8 14      Hematology Recent Labs  Lab 11/20/21 1352 11/21/21 0518  WBC 4.3 5.6  RBC 4.38 4.49  HGB  11.7* 11.9*  HCT 38.5 38.9  MCV 87.9 86.6  MCH 26.7 26.5  MCHC 30.4 30.6  RDW 16.6* 16.1*  PLT 222 247    Cardiac EnzymesNo results for input(s): "TROPONINI" in the last 168 hours. No results for input(s): "TROPIPOC" in the last 168 hours.   BNP Recent Labs  Lab 11/20/21 1352  BNP 580.8*     DDimer No results for input(s): "DDIMER" in the last 168 hours.   Radiology    No results found.  Cardiac Studies   Nuclear stress test 05/01/2015 ([facility]): IMPRESSION-  1.  MYOCARDIAL PERFUSION STUDY IS ABNORMAL AND SUGGESTIVE OF  MYOCARDIAL ISCHEMIA.   2.  THERE IS A PORTON OF THE BASE TO MID PORTION OF THE LATERAL  INFERIOR SEGMENT OF MODERATE SEVERE COUNTS THAT DID SEEM IMPROVED WITH  REST.   3.  GATED IMAGES CALCULATED AN EJECTION FRACTION OF 35%.  ___________   2D echo  05/02/2015 ([facility]): CONCLUSIONS-  The study quality is technically difficult.  The study is technically limited due to poor acoustic windows.  The left ventricular size is mild to moderately dilated.  Mild to moderate concentric left ventricular hypertrophy is observed.  There is severely decreased left ventricular systolic function.  The estimated ejection fraction is 25-30%.  The left ventricular diastolic filling pattern is restrictive.  The left ventricular diastolic filling pattern is consistent with  elevated mean left atrial pressure.  There is mild to moderate mitral regurgitation.  There is mild tricuspid regurgitation.  There is mild to moderate pulmonic regurgitation.  There is evidence of moderate pulmonary hypertension.  The right ventricular systolic pressure is estimated to be 55-60 mmHg.  A trivial pericardial effusion is visualized.  There is no patent foramen ovale visualized.  A patent foramen ovale is not demonstrated with color doppler and  agitated saline contrast.  __________   2D echo 04/01/2017 ([facility]): Conclusions  Moderate concentric left ventricular hypertrophy  Normal left ventricular size and systolic function with no appreciable  segmental abnormality.  Ejection fraction is visually estimated at 50-55%  Normal right ventricle structure and function.  __________   [facility] 03/06/2018: There was no ST segment deviation noted during stress. No T wave inversion was noted during stress. Defect 1: There is a small defect of mild severity present in the basal anteroseptal and mid anteroseptal location. Findings consistent with ischemia. This is a low risk study. The left ventricular ejection fraction is mildly decreased (45-54%). Anteroseptal perfusion defect does not correlate with the inferior wall motion abnormality. The anteroseptal defect may be either ischemia or breast attenuation artifact. __________   2D echo  11/15/2019 ([facility]): Conclusions:                The study quality is technically difficult.  The study is technically limited due to poor acoustic windows.  The left ventricular size is mild to moderately dilated.  Mild to moderate concentric left ventricular hypertrophy is observed.  Global left ventricular wall motion and contractility are within normal  limits.  The estimated ejection fraction is 55-60%.  The left ventricular diastolic filling pattern is consistent with  pseudonormalization.  The left ventricular diastolic filling pattern is consistent with both  elevated mean left atrial pressure and elevated left ventricular  end-diastolic pressure.  There is mild to moderate mitral regurgitation.  There is mild tricuspid regurgitation.  There is mild to moderate pulmonic regurgitation.  There is evidence  that pulmonary hypertension may be underestimated.  The right ventricular systolic pressure is estimated to be 5-10 mmHg.  A trivial pericardial effusion is visualized.  There is no patent foramen ovale visualized.  A patent foramen ovale is not demonstrated by color Doppler.  There is no dilatation of the aortic root.  There is no dilatation of the ascending aorta.  __________   2D echo 11/30/2020: 1. Left ventricular ejection fraction, by estimation, is 55 to 60%. The  left ventricle has normal function. The left ventricle has no regional  wall motion abnormalities. Left ventricular diastolic parameters were  normal.   2. Right ventricular systolic function is normal. The right ventricular  size is normal.   3. The mitral valve is normal in structure. Trivial mitral valve  regurgitation.   4. The aortic valve is normal in structure. Aortic valve regurgitation is  not visualized. __________   2D echo 11/21/2021: 1. Left ventricular ejection fraction, by estimation, is 55 to 60%. The  left ventricle has normal function. The left ventricle has no  regional  wall motion abnormalities. The left ventricular internal cavity size was  mildly dilated. There is mild left  ventricular hypertrophy. Left ventricular diastolic function could not be  evaluated.   2. Right ventricular systolic function is normal. The right ventricular  size is not well visualized.   3. Left atrial size was mild to moderately dilated.   4. The mitral valve is normal in structure. No evidence of mitral valve  regurgitation.   5. The aortic valve was not well visualized. Aortic valve regurgitation  is not visualized.   6. The inferior vena cava is normal in size with <50% respiratory  variability, suggesting right atrial pressure of 8 mmHg.  Patient Profile     51 y.o. female with history of HFimpEF, CKD stage III, seizure disorder, uncontrolled HTN, and GERD who is being seen today for the evaluation of volume overload at the request of Dr. Arbutus Ped.  Assessment & Plan    1. HFimpEF with severe pulmonary hypertension: -Dyspnea improving with diuresis -RHC this admission showed severely elevated right and left-sided filling pressures, severe pulmonary hypertension and normal cardiac output -Renal function slightly worse today, though largely stable when compared to overall trend this admission -Reduce Lasix gtt to 4 mg/hr with reassessment of UOP over the next 24 hours -Diuresis might be difficult due to the presence of severe pulmonary hypertension and likely RV dysfunction -If renal function worsens with diuresis, she might require inotropic therapy for RV support and consideration for transfer to advanced heart failure service -Coreg, irbesartan, Jardiance -Echo as above   2. Elevated high sensitivity troponin: -Mildly elevated and flat trending, not consistent with ACS -Likely supply demand ischemia in the setting of poorly controlled HTN and CKD -No indication for heparin gtt at this time -Echo with preserved LV systolic function and normal wall  motion -ASA, Lipitor, Imdur, amlodipine  -Chest pain is atypical and historically has been reproducible to palpation, concerning for musculoskeletal etiology -Ideally, would undergo a trial of nonsteroidal, though with noted renal dysfunction that this is not ideal; ongoing management per primary service -Not ideal to proceed with SPECT/CT given body habitus, would like to avoid LHC with renal dysfunction, though if she continues to have pain, and her renal function continues to improve, we may need to consider cath prior to discharge -If pain resolves with medical therapy, would pursue PET/CT as an outpatient   3. Hypertensive heart disease: -She reports adherence  to her medications and denies missing any doses with BP typically in the 130s at home -Blood pressure > 200 mmHg upon presentation  -Improved -Renal artery ultrasound limited, though with mildly elevated velocities bilaterally  -Continue current therapy    4. Acute on CKD stage III: -Monitoring closely -Avoid nephrotoxic agents        For questions or updates, please contact [facility] Please consult [email or web address] for contact info under Cardiology/STEMI.    Signed, Eula Listen, PA-C [facility] Pager: [phone] 11/27/2021, 10:51 AM

## 2021-11-28 DIAGNOSIS — I5033 Acute on chronic diastolic (congestive) heart failure: Secondary | ICD-10-CM | POA: Diagnosis not present

## 2021-11-28 DIAGNOSIS — J9601 Acute respiratory failure with hypoxia: Secondary | ICD-10-CM | POA: Diagnosis not present

## 2021-11-28 DIAGNOSIS — E8729 Other acidosis: Secondary | ICD-10-CM

## 2021-11-28 DIAGNOSIS — N1831 Chronic kidney disease, stage 3a: Secondary | ICD-10-CM | POA: Diagnosis not present

## 2021-11-28 LAB — CBC
HCT: 45.5 % (ref 36.0–46.0)
Hemoglobin: 13.6 g/dL (ref 12.0–15.0)
MCH: 25.9 pg — ABNORMAL LOW (ref 26.0–34.0)
MCHC: 29.9 g/dL — ABNORMAL LOW (ref 30.0–36.0)
MCV: 86.5 fL (ref 80.0–100.0)
Platelets: 247 10*3/uL (ref 150–400)
RBC: 5.26 MIL/uL — ABNORMAL HIGH (ref 3.87–5.11)
RDW: 15.8 % — ABNORMAL HIGH (ref 11.5–15.5)
WBC: 5.2 10*3/uL (ref 4.0–10.5)
nRBC: 0 % (ref 0.0–0.2)

## 2021-11-28 LAB — BASIC METABOLIC PANEL
Anion gap: 9 (ref 5–15)
BUN: 34 mg/dL — ABNORMAL HIGH (ref 6–20)
CO2: 38 mmol/L — ABNORMAL HIGH (ref 22–32)
Calcium: 9.4 mg/dL (ref 8.9–10.3)
Chloride: 94 mmol/L — ABNORMAL LOW (ref 98–111)
Creatinine, Ser: 1.96 mg/dL — ABNORMAL HIGH (ref 0.44–1.00)
GFR, Estimated: 30 mL/min — ABNORMAL LOW (ref >60)
Glucose, Bld: 91 mg/dL (ref 70–99)
Potassium: 3.5 mmol/L (ref 3.5–5.1)
Sodium: 141 mmol/L (ref 135–145)

## 2021-11-28 LAB — LIPID PANEL
Cholesterol: 117 mg/dL (ref 0–200)
HDL: 50 mg/dL (ref >40)
LDL Cholesterol: 51 mg/dL (ref 0–99)
Total CHOL/HDL Ratio: 2.3 RATIO
Triglycerides: 82 mg/dL (ref <150)
VLDL: 16 mg/dL (ref 0–40)

## 2021-11-28 LAB — CK: Total CK: 38 U/L (ref 38–234)

## 2021-11-28 MED ORDER — SILDENAFIL CITRATE 20 MG PO TABS
20.0000 mg | ORAL_TABLET | Freq: Three times a day (TID) | ORAL | Status: DC
  Administered 2021-11-28 – 2021-11-29 (×5): 20 mg via ORAL
  Filled 2021-11-28 (×8): qty 1

## 2021-11-28 MED ORDER — POTASSIUM CHLORIDE CRYS ER 20 MEQ PO TBCR
40.0000 meq | EXTENDED_RELEASE_TABLET | Freq: Once | ORAL | Status: AC
  Administered 2021-11-28: 40 meq via ORAL
  Filled 2021-11-28: qty 2

## 2021-11-28 MED ORDER — TORSEMIDE 20 MG PO TABS
20.0000 mg | ORAL_TABLET | Freq: Two times a day (BID) | ORAL | Status: DC
  Administered 2021-11-28: 20 mg via ORAL
  Filled 2021-11-28: qty 1

## 2021-11-28 NOTE — Progress Notes (Signed)
PROGRESS NOTE    Sheila Hays  [id number] DOB: [birth year]  DOA: 11/20/2021 Date of Service: 11/28/21 PCP: [place] Narrative / Hospital Course:  Sheila Hays is a 51 year old female with morbid obesity, hypertension, hyperlipidemia, chronic HFpEF, CKD 3B, who presented to the ED on 11/20/2021 for evaluation of shortness of breath and chest pain. Initial blood pressure 202/90 in the ED with otherwise normal vitals, SPO2 of 95% on room air.  Labs were mostly unremarkable but BNP elevated 580.8.  Chest x-ray with cardiomegaly, prominent central pulmonary vessels concerning for CHF and sepsis small/minimal bilateral pleural effusions. Blood pressure was treated with Vasotec 0.625 mg IV in the ED, also given furosemide 40 mg IV. 11/20/21: Admitted to the hospital for further evaluation management of acute on chronic HFpEF requiring IV diuresis, close monitoring and management of hypertensive urgency. 06/29-07/04: Underwent RHC 11/25/2021, severe pulmonary HTN, requiring lasix gtt. HTN improved. Trial BiPap for hypercapnic acidosis likely d/t OHS, plan for outpatient sleep study and likley needs home NIV 07/05: cardiology reduced lasix gtt from 6 to 4 mg/h, plan transition to po torsemide when euvolemic / if Cr worsens. Fall in bathroom, no head trauma, no concerns on serial neuro checks.  07/06: Cr bumped up today to 1.9, she did have decent UOP yesterday but only -319 daily net. Net IO Since Admission: -3,707.98 mL [11/28/21 0855] but po intake seems inaccurately measured over 07/03-07/04 so question true net output. GIven renal fxn, I stopped lasix gtt and ordered po torsemide. Await further recommendations from cardiology. Given fall yesterday, will add CK to AM labs though doubt rhabdo would be contributing to renal fxn as diuresis effect seems much more likely.       Consultants:  Cardiology   Procedures: 11/25/2021 R heart catheterization      Subjective: Pt denied CP/SOB, feeling tired but no lightheadedness/dizziness      ASSESSMENT & PLAN:   Principal Problem:   Acute on chronic heart failure with preserved ejection fraction (HFpEF) (HCC) Active Problems:   Hypertensive emergency   Essential hypertension   Chronic kidney disease, stage 3a ([place])   Complex partial seizure disorder ([place])   Morbid obesity ([place])   Chest pain   Dizziness   Acute respiratory failure with hypoxia (HCC)   Hypercapnic acidosis   Pulmonary hypertension (HCC)   Acute on chronic heart failure with preserved ejection fraction (HFpEF) ([place]), POA Echo on showed EF 55 to [id number], diastolic function could not be evaluated, mild LVH, left atrium mild to moderately dilated. Cardiology consulted --> 07/04 Lasix drip after RHC showed volume overload, severe pulmonary HTN Lasix drip stopped morning 07/06 d/t renal fxn worsening - appreciate cardiology recs, if they feel lasix gtt is necessary can restart this  Continue Coreg, irbesartan, Imdur, empagliflozin Added torsemide po to continue diuresis Cardiology added sildenafil 20 mg tid  Strict I/O's and daily weights   Hypertensive emergency - resolved Initial BP 202/90, resulting in pulmonary edema and decompensated CHF. Renal artery duplex U/S limited, showed mildly elevated velocities at origin of renal arteries but could not be compared to aortic velocities due to bowel gas MRA or CTA recommended if high clinical suspicion for RAS Currently lower suspicion for RAS as compliance with meds since admission BP is controlled.  Not resistant hypertension.   Pulmonary hypertension ([place]) Likely related to untreated OHS/OSA, morbid obesity.  Right Heart Cath on 7/3 --showed evidence of severely elevated right and left-sided filling pressures, severe pulmonary  hypertension and normal cardiac output. 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  A.m. VBG on 7/2 with pH of 7.29, PCO2 of 85, HCO3 40.9 and PO2 of 70. Outpatient sleep study Trial overnight BiPAP Patient likely needs home NIV   Acute respiratory failure with hypoxia ([place]) - resolved Patient presented with hypertensive heart failure and dyspnea.  Despite diuresis she continues to require oxygen.  Qualifies for home oxygen at 2 L/min.  Suspect due to underlying OHS, pulmonary HTN.   Home O2 ordered Supplemental O2, maintain sats >90% Sleep study recommended outpatient   Dizziness 7/1 - with BP control improved, patient having significant dizziness when up to ambulate.  She has had some soft BP's 99/52, 112/57.  Suspect orthostatic hypotension vs pt used to high baseline BP. Fall yesterday, see RN notes    Chest pain Atypical, reproducible on palpation, likely musculoskeletal etiology vs esophageal etiology.  Troponin very minimally elevated with flat trend and EKG is nonacute. Most likely demand ischemia. Not consistent with ACS. 7/1 - recurrent bout this AM, trop down to 18 from 28 and EKG remains normal. Cardiology following Nitro paste discontinued Ischemic evaluation as outpatient   Morbid obesity ([place]) Body mass index is 46.9 kg/m. Complicates overall care and prognosis.  Recommend lifestyle modifications including physical activity and diet for weight loss and overall long-term health.   Highly suspect OSA and/or OHS. Recommend sleep study as outpatient. Qualifies for home O2 here.   Complex partial seizure disorder (HCC) Resumed oxcarbamazepine 600 mg p.o. twice daily; Keppra 500 mg p.o. twice daily Resumed on Lamictal at 25 mg p.o. twice daily because lamictal requires gradual increase dosing Follow-up with primary care and/or neurology for further recommendations regarding Lamictal dosing Ativan 2 mg IV as needed for seizure, 2 doses ordered   Chronic kidney disease, stage 3a ([place]) Renal function near baseline on  admission up trended with IV diuresis. Slowly improving since Lasix stopped.    Essential hypertension Presented with hypertensive emergency with pulmonary edema and CHF decompensation.  BP now controlled but with intermittent hypotension.        DVT prophylaxis: Lovenox Code Status: FULL Family Communication: pt asked I call niece Prudence Davidson [phone] Disposition Plan / TOC needs: likely back to previous home environment, may need PT/OT eval for [facility] vs SNF Barriers to discharge / significant pending items: cardiology recs appreciated re: goals and home rx going forward              Objective: Vitals:   11/27/21 1933 11/27/21 2311 11/28/21 0315 11/28/21 0741  BP: 117/60 (!) 147/71 (!) 127/54 (!) 130/58  Pulse: 62 65 71 74  Resp: 20 20 20 18   Temp: 98.8 F (37.1 C) 98.9 F (37.2 C) 99.9 F (37.7 C) 98.8 F (37.1 C)  TempSrc: Oral Oral Oral Oral  SpO2: 96% 96% 96% 96%  Weight:      Height:        Intake/Output Summary (Last 24 hours) at 11/28/2021 0854 Last data filed at 11/27/2021 1832 Gross per 24 hour  Intake 1380.91 ml  Output 1700 ml  Net -319.09 ml    Filed Weights   11/24/21 0403 11/25/21 0300 11/26/21 0349  Weight: 131 kg 131.7 kg 128.2 kg    Examination:  Constitutional:  VS as above General Appearance: alert, well-developed, well-nourished, NAD Eyes: Normal lids and conjunctive, non-icteric sclera Ears, Nose, Mouth, Throat:  Normal appearance Neck: No masses, trachea midline No thyroid enlargement/tenderness/mass appreciated Respiratory: Normal respiratory effort Breath sounds normal, no wheeze/rhonchi/rales Cardiovascular: S1/S2 normal, no murmur/rub/gallop auscultated Trace lower extremity edema Gastrointestinal: Nontender, no masses Musculoskeletal:  No clubbing/cyanosis of digits Neurological: No cranial nerve deficit on limited exam Motor and sensation intact and symmetric Psychiatric: Normal judgment/insight Normal mood and  affect       Scheduled Medications:   amLODipine  5 mg Oral Daily   vitamin C  500 mg Oral Daily   aspirin EC  81 mg Oral Daily   atorvastatin  40 mg Oral q1800   carvedilol  25 mg Oral BID WC   empagliflozin  10 mg Oral Daily   enoxaparin (LOVENOX) injection  0.5 mg/kg Subcutaneous Q24H   escitalopram  20 mg Oral q morning   folic acid  500 mcg Oral Daily   irbesartan  75 mg Oral Daily   isosorbide mononitrate  30 mg Oral Daily   lamoTRIgine  25 mg Oral BID   levETIRAcetam  500 mg Oral BID   mometasone-formoterol  2 puff Inhalation BID   oxcarbazepine  600 mg Oral BID   pantoprazole  40 mg Oral Daily   potassium chloride  40 mEq Oral Once   sodium chloride flush  3 mL Intravenous Q12H   sodium chloride flush  3 mL Intravenous Q12H    Continuous Infusions:  sodium chloride     furosemide (LASIX) 200 mg in dextrose 5 % 100 mL (2 mg/mL) infusion 4 mg/hr (11/27/21 1137)    PRN Medications:  sodium chloride, albuterol, butalbital-acetaminophen-caffeine, HYDROcodone-acetaminophen, linaclotide, LORazepam, ondansetron **OR** ondansetron (ZOFRAN) IV, mouth rinse, senna-docusate, sodium chloride flush, traZODone  Antimicrobials:  Anti-infectives (From admission, onward)    None       Data Reviewed: I have personally reviewed following labs and imaging studies  CBC: Recent Labs  Lab 11/28/21 0625  WBC 5.2  HGB 13.6  HCT 45.5  MCV 86.5  PLT 247    Basic Metabolic Panel: Recent Labs  Lab 11/22/21 0551 11/23/21 0621 11/24/21 0526 11/25/21 0520 11/26/21 0538 11/27/21 0551 11/28/21 0625  NA 143   < > 140 144 141 144 141  K 4.0   < > 4.1 4.2 3.5 3.3* 3.5  CL 102   < > 101 101 95* 91* 94*  CO2 33*   < > 34* 35* 38* 39* 38*  GLUCOSE 72   < > 102* 83 81 83 91  BUN 30*   < > 25* 21* 22* 28* 34*  CREATININE 1.80*   < > 1.57* 1.41* 1.46* 1.66* 1.96*  CALCIUM 8.7*   < > 8.5* 8.9 9.2 9.7 9.4  MG 2.1  --   --   --  2.0 2.0  --    < > = values in this interval not  displayed.    GFR: Estimated Creatinine Clearance: 46.1 mL/min (A) (by C-G formula based on SCr of 1.96 mg/dL (H)). Liver Function Tests: No results for input(s): "AST", "ALT", "ALKPHOS", "BILITOT", "PROT", "ALBUMIN" in the last 168 hours. No results for input(s): "LIPASE", "AMYLASE" in the last 168 hours. No results for input(s): "AMMONIA" in the last 168 hours. Coagulation Profile: No results for input(s): "INR", "PROTIME" in the last 168 hours. Cardiac Enzymes: No results for input(s): "CKTOTAL", "CKMB", "CKMBINDEX", "TROPONINI" in the last 168 hours. BNP (last 3 results) No results for input(s): "PROBNP" in the last 8760 hours. HbA1C: No results for input(s): "HGBA1C" in the last 72  hours. CBG: Recent Labs  Lab 11/24/21 2023 11/25/21 0721 11/25/21 1139  GLUCAP 112* 89 84    Lipid Profile: No results for input(s): "CHOL", "HDL", "LDLCALC", "TRIG", "CHOLHDL", "LDLDIRECT" in the last 72 hours. Thyroid Function Tests: No results for input(s): "TSH", "T4TOTAL", "FREET4", "T3FREE", "THYROIDAB" in the last 72 hours. Anemia Panel: No results for input(s): "VITAMINB12", "FOLATE", "FERRITIN", "TIBC", "IRON", "RETICCTPCT" in the last 72 hours. Urine analysis:    Component Value Date/Time   COLORURINE YELLOW (A) 06/14/2021 1830   APPEARANCEUR CLEAR (A) 06/14/2021 1830   LABSPEC 1.011 06/14/2021 1830   PHURINE 5.0 06/14/2021 1830   GLUCOSEU NEGATIVE 06/14/2021 1830   HGBUR NEGATIVE 06/14/2021 1830   BILIRUBINUR NEGATIVE 06/14/2021 1830   KETONESUR NEGATIVE 06/14/2021 1830   PROTEINUR 100 (A) 06/14/2021 1830   NITRITE NEGATIVE 06/14/2021 1830   LEUKOCYTESUR LARGE (A) 06/14/2021 1830   Sepsis Labs: @LABRCNTIP (procalcitonin:4,lacticidven:4)  No results found for this or any previous visit (from the past 240 hour(s)).       Radiology Studies last 96 hours: CARDIAC CATHETERIZATION  Result Date: 11/25/2021   Hemodynamic findings consistent with severe pulmonary  hypertension. Successful right heart catheterization via the right antecubital vein. This showed evidence of severely elevated right and left-sided filling pressures, severe pulmonary hypertension and normal cardiac output. RA: 22 mmHg RV: 92/18 with an end-diastolic pressure of 32 mmHg. PA: 95/44 with a mean of 64 mmHg PCW: 30 mmHg Cardiac output: 7.4 with an index of 3.16. Pulmonary vascular resistance: 4.5 Woods units Recommendations: The patient is significantly volume overloaded. Pulmonary hypertension is severe and seems to be of a mixed venous and arterial etiology. We will start the patient on furosemide drip and the dose should be uptitrated based on urine output response.  Diuresis might be difficult due to the presence of severe pulmonary hypertension and likely RV dysfunction.  If renal function worsens with diuresis, she might require inotropic therapy for RV support and consideration to transfer to an advanced heart failure service.            LOS: 7 days    01/26/2022, DO [facility] 11/28/2021, 8:54 AM   Staff may message me via secure chat in Epic  but this may not receive immediate response,  please page for urgent matters!  If 7PM-7AM, please contact night-coverage [email or web address]  Dictation software was used to generate the above note. Typos may occur and escape review, as with typed/written notes. Please contact Dr 01/29/2022 directly for clarity if needed.

## 2021-11-28 NOTE — Progress Notes (Signed)
Cardiology Progress Note   Patient Name: Sheila Hays Date of Encounter: 11/28/2021  Primary Cardiologist: Peter [place], MD  Subjective   Patient states she is feeling okay today.  C/o back and leg pain s/p fall yesterday afternoon - felt lightheaded and things went black. No events on tele during episode.  Dyspnea about the same as yesterday, overall improved since admission.  One episode of chest pain overnight, resolved with Norco.   Inpatient Medications    Scheduled Meds:  amLODipine  5 mg Oral Daily   vitamin C  500 mg Oral Daily   aspirin EC  81 mg Oral Daily   atorvastatin  40 mg Oral q1800   carvedilol  25 mg Oral BID WC   empagliflozin  10 mg Oral Daily   enoxaparin (LOVENOX) injection  0.5 mg/kg Subcutaneous Q24H   escitalopram  20 mg Oral q morning   folic acid  500 mcg Oral Daily   irbesartan  75 mg Oral Daily   isosorbide mononitrate  30 mg Oral Daily   lamoTRIgine  25 mg Oral BID   levETIRAcetam  500 mg Oral BID   mometasone-formoterol  2 puff Inhalation BID   oxcarbazepine  600 mg Oral BID   pantoprazole  40 mg Oral Daily   sodium chloride flush  3 mL Intravenous Q12H   sodium chloride flush  3 mL Intravenous Q12H   torsemide  20 mg Oral BID   Continuous Infusions:  sodium chloride     PRN Meds: sodium chloride, albuterol, butalbital-acetaminophen-caffeine, HYDROcodone-acetaminophen, linaclotide, LORazepam, ondansetron **OR** ondansetron (ZOFRAN) IV, mouth rinse, senna-docusate, sodium chloride flush, traZODone   Vital Signs    Vitals:   11/27/21 1933 11/27/21 2311 11/28/21 0315 11/28/21 0741  BP: 117/60 (!) 147/71 (!) 127/54 (!) 130/58  Pulse: 62 65 71 74  Resp: 20 20 20 18   Temp: 98.8 F (37.1 C) 98.9 F (37.2 C) 99.9 F (37.7 C) 98.8 F (37.1 C)  TempSrc: Oral Oral Oral Oral  SpO2: 96% 96% 96% 96%  Weight:      Height:        Intake/Output Summary (Last 24 hours) at 11/28/2021 1021 Last data filed at 11/28/2021 0936 Gross per 24 hour   Intake 1020.91 ml  Output 1000 ml  Net 20.91 ml   Filed Weights   11/24/21 0403 11/25/21 0300 11/26/21 0349  Weight: 131 kg 131.7 kg 128.2 kg    Physical Exam   GEN: morbidly obese, in no acute distress.  HEENT: Grossly normal.  Neck: Supple, obese, difficult to gauge JVP.  No carotid bruits, or masses. Cardiac: RRR, no murmurs, rubs, or gallops. No clubbing or cyanosis, or edema.  Radials 2+, DP/PT 2+ and equal bilaterally.  Respiratory:  Respirations regular and unlabored, diminished lung sounds auscultation bilaterally at bases w/ bibasilar crackles. GI: Obese, soft, nontender, nondistended, BS + x 4. MS: no deformity or atrophy. Skin: warm and dry, no rash. Neuro:  Strength and sensation are intact. Psych: AAOx3.  Flat affect.  Labs    Chemistry Recent Labs  Lab 11/26/21 0538 11/27/21 0551 11/28/21 0625  NA 141 144 141  K 3.5 3.3* 3.5  CL 95* 91* 94*  CO2 38* 39* 38*  GLUCOSE 81 83 91  BUN 22* 28* 34*  CREATININE 1.46* 1.66* 1.96*  CALCIUM 9.2 9.7 9.4  GFRNONAA 43* 37* 30*  ANIONGAP 8 14 9      Hematology Recent Labs  Lab 11/28/21 0625  WBC 5.2  RBC 5.26*  HGB 13.6  HCT 45.5  MCV 86.5  MCH 25.9*  MCHC 29.9*  RDW 15.8*  PLT 247    Cardiac Enzymes  Recent Labs  Lab 11/20/21 1352 11/20/21 1634 11/21/21 1019 11/23/21 0942  TROPONINIHS 23* 21* 28* 18*      BNP    Component Value Date/Time   BNP 580.8 (H) 11/20/2021 1352   Lipids  Lab Results  Component Value Date   CHOL 152 03/06/2018   HDL 42 03/06/2018   LDLCALC 89 03/06/2018   TRIG 103 03/06/2018   CHOLHDL 3.6 03/06/2018   Radiology    -------------  Telemetry    NSR rates in 60-70s - Personally Reviewed  ECG    No new readings - Personally Reviewed  Cardiac Studies   2D Echocardiogram 6.29.2023  1. Left ventricular ejection fraction, by estimation, is 55 to 60%. The  left ventricle has normal function. The left ventricle has no regional  wall motion abnormalities.  The left ventricular internal cavity size was  mildly dilated. There is mild left  ventricular hypertrophy. Left ventricular diastolic function could not be  evaluated.   2. Right ventricular systolic function is normal. The right ventricular  size is not well visualized.   3. Left atrial size was mild to moderately dilated.   4. The mitral valve is normal in structure. No evidence of mitral valve  regurgitation.   5. The aortic valve was not well visualized. Aortic valve regurgitation  is not visualized.   6. The inferior vena cava is normal in size with <50% respiratory  variability, suggesting right atrial pressure of 8 mmHg.  _____________   Right heart cath  11/25/21  RA: 22 mmHg RV: 92/18 with an end-diastolic pressure of 32 mmHg. PA: 95/44 with a mean of 64 mmHg PCW: 30 mmHg  Cardiac output: 7.4 with an index of 3.16. Pulmonary vascular resistance: 4.5 Woods units _____________   Patient Profile     51 y.o. female with history of HFpEF, morbid obesity, CKD 3, and hypertension who was admitted 6/28 w/ dyspnea, chest pain, and volume overload.    Assessment & Plan    1. Acute on chronic HFpEF/PAH: Admitted 6/28 w/ volume overload and dyspnea.  Echo this admission w/ EF 55-60%.  RHC 7/3 w/ markedly elevated filling pressures (PA 95/44, PCWP 30).  She has been on lasix gtt w/ rising creat.  I/O only minus 319 yesterday, and minus 1.6L since admission.  Wt appears to be at prior baseline.  She no longer has lower ext or obvious gut edema.  Body habitus makes exam challenging.  Creat up from 1.66  1.96, baseline 1.6. IV lasix gtt stopped and Torsemide 20 mg BID started, though w/ rising creat, I will hold tonight, as I suspect that she is intravascularly depleted. Will continue coreg, irbesartan, jardiance, though if creat rises further, may need to consider holding ARB/SGLT2i as well.  Once renal fxn stabilizes, she would likely benefit from spiro, in an effort to reduce risk of  rehosp.  We discussed the importance of daily weights, sodium restriction, medication compliance, and symptom reporting and she verbalizes understanding.  She will need sleep eval as outpt.  2. Essential HTN: Relatively table on current regimen.  Continue amlodipine, imdur, ibesartan.  Consider spiro pending improvement in renal fxn.  3. CKD 3: Creatine increased from 1.66  1.96, baseline 1.6. IV lasix stopped.  Torsemide 20 mg BID started - will hold this evenings dose.  4.  Demand Ischemia/Chest pain:  in setting of CHF, hsTrop mildly elevated @ @ 23  21  28   18.  Suspect this is demand ischemia in the setting of volume overload.  Echo reassuring.  Consider outpt lexiscan PET/CT.  Cont asa, nitrate.  F/u lipids.  5.  S/p Fall:  pt fell last night.  Patient unsure if it was a seizure or lightheadedness. No events on tele.  BP has been normal to high.  Etiology unclear.     Signed, , NP  11/28/2021, 10:21 AM    For questions or updates, please contact   Please consult [email or web address] for contact info under Cardiology/STEMI.

## 2021-11-29 ENCOUNTER — Inpatient Hospital Stay: Payer: Self-pay

## 2021-11-29 DIAGNOSIS — I5033 Acute on chronic diastolic (congestive) heart failure: Secondary | ICD-10-CM | POA: Diagnosis not present

## 2021-11-29 LAB — CBC
HCT: 45.5 % (ref 36.0–46.0)
Hemoglobin: 13.6 g/dL (ref 12.0–15.0)
MCH: 26.3 pg (ref 26.0–34.0)
MCHC: 29.9 g/dL — ABNORMAL LOW (ref 30.0–36.0)
MCV: 88 fL (ref 80.0–100.0)
Platelets: 236 10*3/uL (ref 150–400)
RBC: 5.17 MIL/uL — ABNORMAL HIGH (ref 3.87–5.11)
RDW: 15.9 % — ABNORMAL HIGH (ref 11.5–15.5)
WBC: 5.3 10*3/uL (ref 4.0–10.5)
nRBC: 0 % (ref 0.0–0.2)

## 2021-11-29 LAB — BASIC METABOLIC PANEL
Anion gap: 8 (ref 5–15)
BUN: 40 mg/dL — ABNORMAL HIGH (ref 6–20)
CO2: 37 mmol/L — ABNORMAL HIGH (ref 22–32)
Calcium: 9 mg/dL (ref 8.9–10.3)
Chloride: 98 mmol/L (ref 98–111)
Creatinine, Ser: 2.26 mg/dL — ABNORMAL HIGH (ref 0.44–1.00)
GFR, Estimated: 25 mL/min — ABNORMAL LOW (ref >60)
Glucose, Bld: 88 mg/dL (ref 70–99)
Potassium: 3.9 mmol/L (ref 3.5–5.1)
Sodium: 143 mmol/L (ref 135–145)

## 2021-11-29 LAB — GLUCOSE, CAPILLARY: Glucose-Capillary: 90 mg/dL (ref 70–99)

## 2021-11-29 MED ORDER — HYDROCODONE-ACETAMINOPHEN 5-325 MG PO TABS
1.0000 | ORAL_TABLET | Freq: Three times a day (TID) | ORAL | Status: DC | PRN
  Administered 2021-11-30 – 2021-12-05 (×9): 1 via ORAL
  Filled 2021-11-29 (×9): qty 1

## 2021-11-29 MED ORDER — CHLORHEXIDINE GLUCONATE CLOTH 2 % EX PADS
6.0000 | MEDICATED_PAD | Freq: Every day | CUTANEOUS | Status: DC
Start: 2021-11-29 — End: 2021-12-06
  Administered 2021-11-29 – 2021-12-04 (×4): 6 via TOPICAL

## 2021-11-29 MED ORDER — SODIUM CHLORIDE 0.9% FLUSH
10.0000 mL | Freq: Two times a day (BID) | INTRAVENOUS | Status: DC
  Administered 2021-11-30 – 2021-12-02 (×4): 10 mL

## 2021-11-29 MED ORDER — NOREPINEPHRINE 4 MG/250ML-% IV SOLN
0.0000 ug/min | INTRAVENOUS | Status: DC
  Administered 2021-11-29: 2 ug/min via INTRAVENOUS

## 2021-11-29 MED ORDER — SODIUM CHLORIDE 0.9% FLUSH
10.0000 mL | INTRAVENOUS | Status: DC | PRN
  Administered 2021-11-30: 10 mL

## 2021-11-29 MED ORDER — CARVEDILOL 6.25 MG PO TABS: 6.2500 mg | ORAL_TABLET | Freq: Two times a day (BID) | ORAL | Status: DC

## 2021-11-29 MED ORDER — STERILE WATER FOR INJECTION IJ SOLN
INTRAMUSCULAR | Status: AC
  Filled 2021-11-29: qty 10

## 2021-11-29 MED ORDER — ALTEPLASE 2 MG IJ SOLR
2.0000 mg | Freq: Once | INTRAMUSCULAR | Status: AC
  Administered 2021-11-29: 2 mg
  Filled 2021-11-29: qty 2

## 2021-11-29 MED ORDER — MILRINONE LACTATE IN DEXTROSE 20-5 MG/100ML-% IV SOLN
0.2500 ug/kg/min | INTRAVENOUS | Status: DC
  Administered 2021-11-29 – 2021-12-04 (×11): 0.25 ug/kg/min via INTRAVENOUS
  Filled 2021-11-29 (×12): qty 100

## 2021-11-29 MED ORDER — NOREPINEPHRINE 4 MG/250ML-% IV SOLN
INTRAVENOUS | Status: AC
  Filled 2021-11-29: qty 250

## 2021-11-29 NOTE — Progress Notes (Signed)
PROGRESS NOTE    Velva Molinari  ZOX:[id number] DOB: 1970/11/03  DOA: 11/20/2021 Date of Service: 11/29/21 PCP: [facility]     Brief Narrative / Hospital Course:  Amantha Sklar is a 51 year old female with morbid obesity, hypertension, hyperlipidemia, chronic HFpEF, CKD 3B, who presented to the ED on 11/20/2021 for evaluation of shortness of breath and chest pain. Initial blood pressure 202/90 in the ED with otherwise normal vitals, SPO2 of 95% on room air.  Labs were mostly unremarkable but BNP elevated 580.8.  Chest x-ray with cardiomegaly, prominent central pulmonary vessels concerning for CHF and sepsis small/minimal bilateral pleural effusions. Blood pressure was treated with Vasotec 0.625 mg IV in the ED, also given furosemide 40 mg IV. 11/20/21: Admitted to the hospital for further evaluation management of acute on chronic HFpEF requiring IV diuresis, close monitoring and management of hypertensive urgency. 06/29-07/04: Underwent RHC 11/25/2021, severe pulmonary HTN, requiring lasix gtt. HTN improved. Trial BiPap for hypercapnic acidosis likely d/t OHS, plan for outpatient sleep study and likley needs home NIV 07/05: cardiology reduced lasix gtt from 6 to 4 mg/h, plan transition to po torsemide when euvolemic / if Cr worsens. Fall in bathroom, no head trauma, no concerns on serial neuro checks.  07/06: Cr bumped up today to 1.9, she did have decent UOP yesterday but only -319 daily net. Net IO Since Admission: -3,707.98 mL [11/28/21 0855] but po intake seems inaccurately measured over 07/03-07/04 so question true net output. GIven renal fxn, I stopped lasix gtt and ordered po torsemide. Await further recommendations from cardiology. Given fall yesterday, will add CK to AM labs though doubt rhabdo would be contributing to renal fxn as diuresis effect seems much more likely.  07/07: moved to ICU per cardiology recs, placed on milrinone drip for a few days with plan to start  low-dose Lasix drip, placed PICC line and monitor CVP. BP low, MAP low, started norepi       Consultants:  Cardiology   Procedures: 11/25/2021 R heart catheterization     Subjective: Pt denied CP/SOB, feeling tired but no lightheadedness/dizziness      ASSESSMENT & PLAN:   Principal Problem:   Acute on chronic heart failure with preserved ejection fraction (HFpEF) (HCC) Active Problems:   Hypertensive emergency   Essential hypertension   Chronic kidney disease, stage 3a (HCC)   Complex partial seizure disorder (HCC)   Morbid obesity (HCC)   Chest pain   Dizziness   Acute respiratory failure with hypoxia (HCC)   Hypercapnic acidosis   Pulmonary hypertension (HCC)   Acute on chronic heart failure with preserved ejection fraction (HFpEF) (HCC), POA Echo on showed EF 55 to 60%, diastolic function could not be evaluated, mild LVH, left atrium mild to moderately dilated. 07/07 moved to ICU per cardiology recs, placed on milrinone drip for a few days with plan to start low-dose Lasix drip, placed PICC line and monitor CVP. BP low, MAP low, started norepi    Hypertensive emergency - resolved Initial BP 202/90, resulting in pulmonary edema and decompensated CHF. lower suspicion for RAS    Pulmonary hypertension (HCC) Likely related to untreated OHS/OSA, morbid obesity.  Right Heart Cath on 7/3 --showed evidence of severely elevated right and left-sided filling pressures, severe pulmonary hypertension and normal cardiac output. 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  A.m. VBG on  7/2 with pH of 7.29, PCO2 of 85, HCO3 40.9 and PO2 of 70. Outpatient sleep study Trial overnight BiPAP Patient likely needs home NIV   Acute respiratory failure with hypoxia (HCC) - resolved Patient presented with hypertensive heart failure and dyspnea.  Despite diuresis  she continues to require oxygen.  Qualifies for home oxygen at 2 L/min.  Suspect due to underlying OHS, pulmonary HTN.   Home O2 ordered Supplemental O2, maintain sats >90% Sleep study recommended outpatient   Dizziness 7/1 - with BP control improved, patient having significant dizziness when up to ambulate.  She has had some soft BP's 99/52, 112/57.  Suspect orthostatic hypotension vs pt used to high baseline BP. Fall yesterday, see RN notes    Chest pain Atypical, reproducible on palpation, likely musculoskeletal etiology vs esophageal etiology.  Troponin very minimally elevated with flat trend and EKG is nonacute. Most likely demand ischemia. Not consistent with ACS. 7/1 - recurrent bout this AM, trop down to 18 from 28 and EKG remains normal. Cardiology following Nitro paste discontinued Ischemic evaluation as outpatient   Morbid obesity (HCC) Body mass index is 46.9 kg/m. Complicates overall care and prognosis.  Recommend lifestyle modifications including physical activity and diet for weight loss and overall long-term health.   Highly suspect OSA and/or OHS. Recommend sleep study as outpatient. Qualifies for home O2 here.   Complex partial seizure disorder (HCC) Resumed oxcarbamazepine 600 mg p.o. twice daily; Keppra 500 mg p.o. twice daily Resumed on Lamictal at 25 mg p.o. twice daily because lamictal requires gradual increase dosing Follow-up with primary care and/or neurology for further recommendations regarding Lamictal dosing Ativan 2 mg IV as needed for seizure, 2 doses ordered   Chronic kidney disease, stage 3a (HCC) Renal function near baseline on admission up trended with IV diuresis. Was improving since Lasix stopped. but climbing again   Essential hypertension Presented with hypertensive emergency with pulmonary edema and CHF decompensation.  BP now controlled but with intermittent hypotension.        DVT prophylaxis: Lovenox Code Status: FULL Family  Communication: pt declined call to family today, she was on the phone w/ someone when I rounded  Disposition Plan / TOC needs: likely back to previous home environment, may need PT/OT eval for [facility] vs SNF Barriers to discharge / significant pending items: cardiology recs appreciated re: goals and home rx going forward. Currently care has been escalated and patient is in ICU              Objective: Vitals:   11/29/21 1630 11/29/21 1700 11/29/21 1730 11/29/21 1735  BP: (!) 97/54 101/60 (!) 78/41 (!) 86/45  Pulse: 60 61 62   Resp: 16 (!) 22 13   Temp:      TempSrc:      SpO2: 100% 99% 100%   Weight:      Height:        Intake/Output Summary (Last 24 hours) at 11/29/2021 1741 Last data filed at 11/29/2021 1501 Gross per 24 hour  Intake 402.36 ml  Output --  Net 402.36 ml    Filed Weights   11/25/21 0300 11/26/21 0349 11/29/21 0423  Weight: 131.7 kg 128.2 kg 120.2 kg    Examination:  Constitutional:  VS as above General Appearance: alert, well-developed, well-nourished, NAD Eyes: Normal lids and conjunctive, non-icteric sclera Ears, Nose, Mouth, Throat: Normal appearance Neck: No masses, trachea midline Respiratory: Normal respiratory effort Breath sounds normal, no wheeze/rhonchi/rales Cardiovascular: S1/S2 normal, no murmur/rub/gallop auscultated Trace lower  extremity edema Gastrointestinal: Nontender, no masses Musculoskeletal:  No clubbing/cyanosis of digits Neurological: No cranial nerve deficit on limited exam Motor and sensation intact and symmetric Psychiatric: Normal judgment/insight Normal mood and affect       Scheduled Medications:   vitamin C  500 mg Oral Daily   aspirin EC  81 mg Oral Daily   atorvastatin  40 mg Oral q1800   [START ON 11/30/2021] carvedilol  6.25 mg Oral BID WC   Chlorhexidine Gluconate Cloth  6 each Topical Daily   enoxaparin (LOVENOX) injection  0.5 mg/kg Subcutaneous Q24H   escitalopram  20 mg Oral q morning   folic  acid  [id number] mcg Oral Daily   lamoTRIgine  25 mg Oral BID   levETIRAcetam  500 mg Oral BID   mometasone-formoterol  2 puff Inhalation BID   oxcarbazepine  600 mg Oral BID   pantoprazole  40 mg Oral Daily   sildenafil  20 mg Oral TID   sodium chloride flush  10-40 mL Intracatheter Q12H   sodium chloride flush  3 mL Intravenous Q12H   sodium chloride flush  3 mL Intravenous Q12H    Continuous Infusions:  sodium chloride     milrinone 0.25 mcg/kg/min (11/29/21 1637)    PRN Medications:  sodium chloride, albuterol, butalbital-acetaminophen-caffeine, HYDROcodone-acetaminophen, linaclotide, LORazepam, ondansetron **OR** ondansetron (ZOFRAN) IV, mouth rinse, senna-docusate, sodium chloride flush, sodium chloride flush, traZODone  Antimicrobials:  Anti-infectives (From admission, onward)    None       Data Reviewed: I have personally reviewed following labs and imaging studies  CBC: Recent Labs  Lab 11/28/21 0625 11/29/21 0511  WBC 5.2 5.3  HGB 13.6 13.6  HCT 45.5 45.5  MCV 86.5 88.0  PLT 247 [id number]    Basic Metabolic Panel: Recent Labs  Lab 11/25/21 0520 11/26/21 0538 11/27/21 0551 11/28/21 0625 11/29/21 0511  NA 144 141 144 141 143  K 4.2 3.5 3.3* 3.5 3.9  CL 101 95* 91* 94* 98  CO2 35* 38* 39* 38* 37*  GLUCOSE 83 81 83 91 88  BUN 21* 22* 28* 34* 40*  CREATININE 1.41* 1.46* 1.66* 1.96* 2.26*  CALCIUM 8.9 9.2 9.7 9.4 9.0  MG  --  2.0 2.0  --   --     GFR: Estimated Creatinine Clearance: 38.5 mL/min (A) (by C-G formula based on SCr of 2.26 mg/dL (H)). Liver Function Tests: No results for input(s): "AST", "ALT", "ALKPHOS", "BILITOT", "PROT", "ALBUMIN" in the last 168 hours. No results for input(s): "LIPASE", "AMYLASE" in the last 168 hours. No results for input(s): "AMMONIA" in the last 168 hours. Coagulation Profile: No results for input(s): "INR", "PROTIME" in the last 168 hours. Cardiac Enzymes: Recent Labs  Lab 11/28/21 0625  CKTOTAL 38   BNP (last 3  results) No results for input(s): "PROBNP" in the last 8760 hours. HbA1C: No results for input(s): "HGBA1C" in the last 72 hours. CBG: Recent Labs  Lab 11/24/21 2023 11/25/21 0721 11/25/21 1139 11/29/21 1451  GLUCAP 112* 89 84 90    Lipid Profile: Recent Labs    11/28/21 1324  CHOL 117  HDL 50  LDLCALC 51  TRIG 82  CHOLHDL 2.3   Thyroid Function Tests: No results for input(s): "TSH", "T4TOTAL", "FREET4", "T3FREE", "THYROIDAB" in the last 72 hours. Anemia Panel: No results for input(s): "VITAMINB12", "FOLATE", "FERRITIN", "TIBC", "IRON", "RETICCTPCT" in the last 72 hours. Urine analysis:    Component Value Date/Time   COLORURINE YELLOW (A) 06/14/2021 [place] (  A) 06/14/2021 1830   LABSPEC 1.011 06/14/2021 1830   PHURINE 5.0 06/14/2021 1830   GLUCOSEU NEGATIVE 06/14/2021 1830   HGBUR NEGATIVE 06/14/2021 1830   [place] 06/14/2021 1830   [place] 06/14/2021 1830   PROTEINUR 100 (A) 06/14/2021 1830   NITRITE NEGATIVE 06/14/2021 1830   LEUKOCYTESUR LARGE (A) 06/14/2021 1830   Sepsis Labs: @LABRCNTIP (procalcitonin:4,lacticidven:4)  No results found for this or any previous visit (from the past 240 hour(s)).       Radiology Studies last 96 hours: [place] EKG SITE RITE  Result Date: 11/29/2021 If Site Rite image not attached, placement could not be confirmed due to current cardiac rhythm.           LOS: 8 days    Emeterio Reeve, DO [facility] 11/29/2021, 5:41 PM   Staff may message me via secure chat in [place]  but this may not receive immediate response,  please page for urgent matters!  If 7PM-7AM, please contact night-coverage [email or web address]  Dictation software was used to generate the above note. Typos may occur and escape review, as with typed/written notes. Please contact Dr Sheppard Coil directly for clarity if needed.

## 2021-11-29 NOTE — Care Management Important Message (Signed)
Important Message  Patient Details  Name: Sheila Hays MRN: [id number] Date of Birth: 05-Sep-1970   Medicare Important Message Given:  Yes     [name] 11/29/2021, 11:27 AM

## 2021-11-29 NOTE — Progress Notes (Signed)
This pt PICC line is unable to aspirate blood on three lumen. Insertion site checked stat locked in place.  IV team consult put in for lumen de clogging. Alteplase given on proximal port with dwelling time of two hours. @2330  IV team nurse came back and aspirated proximal and distal lumens (drawing blood but very sluggish). Medial lumen is completely clogged. Awaiting order for Xray to ascertain PICC line's position.

## 2021-11-29 NOTE — Progress Notes (Addendum)
Cardiology Progress Note   Patient Name: Sheila Hays Date of Encounter: 11/29/2021  Primary Cardiologist: Peter [place], MD  Subjective   Overall feels better.  Dyspnea improved since yesterday and overall since admission.  Says that she ambulated some in the room yesterday while wearing O2 - tolerated well.  Has been having chest wall pain and tenderness, that is worse w/ palpation - improves after norco.  Inpatient Medications    Scheduled Meds:  amLODipine  5 mg Oral Daily   vitamin C  500 mg Oral Daily   aspirin EC  81 mg Oral Daily   atorvastatin  40 mg Oral q1800   carvedilol  25 mg Oral BID WC   enoxaparin (LOVENOX) injection  0.5 mg/kg Subcutaneous Q24H   escitalopram  20 mg Oral q morning   folic acid  500 mcg Oral Daily   isosorbide mononitrate  30 mg Oral Daily   lamoTRIgine  25 mg Oral BID   levETIRAcetam  500 mg Oral BID   mometasone-formoterol  2 puff Inhalation BID   oxcarbazepine  600 mg Oral BID   pantoprazole  40 mg Oral Daily   sildenafil  20 mg Oral TID   sodium chloride flush  3 mL Intravenous Q12H   sodium chloride flush  3 mL Intravenous Q12H   Continuous Infusions:  sodium chloride     PRN Meds: sodium chloride, albuterol, butalbital-acetaminophen-caffeine, HYDROcodone-acetaminophen, linaclotide, LORazepam, ondansetron **OR** ondansetron (ZOFRAN) IV, mouth rinse, senna-docusate, sodium chloride flush, traZODone   Vital Signs    Vitals:   11/28/21 1946 11/28/21 2335 11/29/21 0409 11/29/21 0423  BP: (!) 110/58 105/61 (!) 107/50   Pulse: 73 65 68   Resp: 20 20 20    Temp: 98.9 F (37.2 C) 98.9 F (37.2 C) 99 F (37.2 C)   TempSrc: Oral Oral Oral   SpO2: 94% 90% 92%   Weight:    120.2 kg  Height:        Intake/Output Summary (Last 24 hours) at 11/29/2021 0720 Last data filed at 11/28/2021 1639 Gross per 24 hour  Intake 840 ml  Output --  Net 840 ml   Filed Weights   11/25/21 0300 11/26/21 0349 11/29/21 0423  Weight: 131.7 kg 128.2  kg 120.2 kg    Physical Exam   GEN: Obese female, in no acute distress.  HEENT: Grossly normal.  Neck: Supple, obese, difficult to gauge JVD. No carotid bruits, or masses. Cardiac: RRR, no murmurs, rubs, or gallops. No clubbing, cyanosis, edema.  Radials 2+, DP/PT 2+ and equal bilaterally.  Respiratory:  Respirations regular and unlabored, bibasilar crackles that clear w/ deep breathing. GI: Obese, Soft, nontender, nondistended, BS + x 4. MS: no deformity or atrophy. Skin: warm and dry, no rash. Neuro:  Strength and sensation are intact. Psych: AAOx3.  Normal affect.  Labs    Chemistry Recent Labs  Lab 11/27/21 0551 11/28/21 0625 11/29/21 0511  NA 144 141 143  K 3.3* 3.5 3.9  CL 91* 94* 98  CO2 39* 38* 37*  GLUCOSE 83 91 88  BUN 28* 34* 40*  CREATININE 1.66* 1.96* 2.26*  CALCIUM 9.7 9.4 9.0  GFRNONAA 37* 30* 25*  ANIONGAP 14 9 8      Hematology Recent Labs  Lab 11/28/21 0625 11/29/21 0511  WBC 5.2 5.3  RBC 5.26* 5.17*  HGB 13.6 13.6  HCT 45.5 45.5  MCV 86.5 88.0  MCH 25.9* 26.3  MCHC 29.9* 29.9*  RDW 15.8* 15.9*  PLT 247 236  Cardiac Enzymes  Recent Labs  Lab 11/20/21 1352 11/20/21 1634 11/21/21 1019 11/23/21 0942  TROPONINIHS 23* 21* 28* 18*      BNP    Component Value Date/Time   BNP 580.8 (H) 11/20/2021 1352   Lipids  Lab Results  Component Value Date   CHOL 117 11/28/2021   HDL 50 11/28/2021   LDLCALC 51 11/28/2021   TRIG 82 11/28/2021   CHOLHDL 2.3 11/28/2021   Radiology   ------------   Telemetry    NSR, rates in the 60s - Personally Reviewed  ECG    No new tracings - Personally Reviewed  Cardiac Studies   2D Echocardiogram 6.29.2023   1. Left ventricular ejection fraction, by estimation, is 55 to 60%. The  left ventricle has normal function. The left ventricle has no regional  wall motion abnormalities. The left ventricular internal cavity size was  mildly dilated. There is mild left  ventricular hypertrophy. Left  ventricular diastolic function could not be  evaluated.   2. Right ventricular systolic function is normal. The right ventricular  size is not well visualized.   3. Left atrial size was mild to moderately dilated.   4. The mitral valve is normal in structure. No evidence of mitral valve  regurgitation.   5. The aortic valve was not well visualized. Aortic valve regurgitation  is not visualized.   6. The inferior vena cava is normal in size with <50% respiratory  variability, suggesting right atrial pressure of 8 mmHg.  _____________    Right heart cath  11/25/21   RA: 22 mmHg RV: 92/18 with an end-diastolic pressure of 32 mmHg. PA: 95/44 with a mean of 64 mmHg PCW: 30 mmHg  Cardiac output: 7.4 with an index of 3.16. Pulmonary vascular resistance: 4.5 [place] units  Patient Profile     51 y.o. female with history of HFpEF, morbid obesity, CKD 3, and hypertension who was admitted 6/28 w/ dyspnea, chest pain, and volume overload.  Assessment & Plan    1. Acute on chronic HFpEF/PAH: Admitted 6/28 w/ volume overload and dyspnea.  Echo this admission w/ EF 55-60%.  RHC 7/3 w/ markedly elevated filling pressures (PA 95/44, PCWP 30).  Lasix gtt discontinued 7/6 d/t rising creat.  I/O inaccurate in last 24 hours, minus 1.6L since admission.  Wt appears to be at prior baseline.  She no longer has lower ext or obvious abd edema.  Body habitus makes exam challenging.  Creat continues to rise from 1.66  1.96 2.26, baseline 1.6. IV lasix gtt stopped yesterday and Torsemide 20 mg BID started, though w/ rising creat, evening dose was held. Given rising creat we will continue to hold Torsemide until return to baseline. Will continue coreg but holding Jardiance and irbesartan given rising creat.  Sildenafil started 7/6 - will need to watch out for hypotension.  Watch volume status closely off of meds.  She will need sleep eval as outpt.   2. Essential HTN: Relatively stable on current regimen.  Continue  amlodipine, imdur. Ibesartan held d/t rising creat.     3. AKI on CKD 3: Creatine increased from 1.66  1.96  2.26, baseline 1.6. IV lasix stopped 7/6.  Torsemide 20 mg BID started 7/6 but evening dose was held.  We will continue to hold until creat elevation returns to baseline.  May need to gently hydrate.   4.  Demand Ischemia/Chest pain:  in setting of CHF, hsTrop mildly elevated @ @ 23  21  28   18.  Suspect this is demand ischemia in the setting of volume overload.  Echo reassuring.  Lipids wnl. Consider outpt lexiscan PET/CT.  Cont asa, nitrate, statin.  5.  MSK Chest pain:  reproducible c/p w/ palpation.  She has been receiving norco.  Rec de-escalating analgesics.   Signed, Nicolasa Ducking, NP  11/29/2021, 7:20 AM    For questions or updates, please contact   Please consult [email or web address] for contact info under Cardiology/STEMI.

## 2021-11-29 NOTE — Progress Notes (Signed)
Peripherally Inserted Central Catheter Placement  The IV Nurse has discussed with the patient and/or persons authorized to consent for the patient, the purpose of this procedure and the potential benefits and risks involved with this procedure.  The benefits include less needle sticks, lab draws from the catheter, and the patient may be discharged home with the catheter. Risks include, but not limited to, infection, bleeding, blood clot (thrombus formation), and puncture of an artery; nerve damage and irregular heartbeat and possibility to perform a PICC exchange if needed/ordered by physician.  Alternatives to this procedure were also discussed.  Bard Power PICC patient education guide, fact sheet on infection prevention and patient information card has been provided to patient /or left at bedside.    PICC Placement Documentation  PICC Triple Lumen 11/29/21 Right Brachial 44 cm 0 cm (Active)  Indication for Insertion or Continuance of Line Vasoactive infusions 11/29/21 1600  Exposed Catheter (cm) 0 cm 11/29/21 1600  Site Assessment Clean, Dry, Intact 11/29/21 1600  Lumen #1 Status Flushed;Blood return noted;Saline locked 11/29/21 1600  Lumen #2 Status Flushed;Blood return noted;Saline locked 11/29/21 1600  Lumen #3 Status Flushed;Blood return noted;Saline locked 11/29/21 1600  Dressing Type Transparent;Securing device 11/29/21 1600  Dressing Status Clean, Dry, Intact;Antimicrobial disc in place 11/29/21 1600  Dressing Change Due 12/06/21 11/29/21 1600       Stacie Glaze Horton 11/29/2021, 4:31 PM

## 2021-11-30 ENCOUNTER — Inpatient Hospital Stay: Payer: Medicare Other

## 2021-11-30 DIAGNOSIS — I5033 Acute on chronic diastolic (congestive) heart failure: Secondary | ICD-10-CM | POA: Diagnosis not present

## 2021-11-30 LAB — BASIC METABOLIC PANEL
Anion gap: 12 (ref 5–15)
BUN: 38 mg/dL — ABNORMAL HIGH (ref 6–20)
CO2: 33 mmol/L — ABNORMAL HIGH (ref 22–32)
Calcium: 9.4 mg/dL (ref 8.9–10.3)
Chloride: 94 mmol/L — ABNORMAL LOW (ref 98–111)
Creatinine, Ser: 1.84 mg/dL — ABNORMAL HIGH (ref 0.44–1.00)
GFR, Estimated: 33 mL/min — ABNORMAL LOW (ref >60)
Glucose, Bld: 129 mg/dL — ABNORMAL HIGH (ref 70–99)
Potassium: 3.4 mmol/L — ABNORMAL LOW (ref 3.5–5.1)
Sodium: 139 mmol/L (ref 135–145)

## 2021-11-30 MED ORDER — NOREPINEPHRINE 4 MG/250ML-% IV SOLN
2.0000 ug/min | INTRAVENOUS | Status: DC
  Administered 2021-11-30: 6 ug/min via INTRAVENOUS
  Administered 2021-11-30: 4 ug/min via INTRAVENOUS
  Filled 2021-11-30 (×2): qty 250

## 2021-11-30 MED ORDER — SODIUM CHLORIDE 0.9 % IV SOLN: 250.0000 mL | INTRAVENOUS | Status: DC

## 2021-11-30 MED ORDER — POTASSIUM CHLORIDE CRYS ER 20 MEQ PO TBCR
40.0000 meq | EXTENDED_RELEASE_TABLET | Freq: Once | ORAL | Status: AC
  Administered 2021-11-30: 40 meq via ORAL
  Filled 2021-11-30: qty 2

## 2021-11-30 NOTE — Progress Notes (Signed)
PROGRESS NOTE    Sheila Hays  [id number] DOB: Dec 23, 1970  DOA: 11/20/2021 Date of Service: 11/30/21 PCP: [facility] Narrative / Hospital Course:  Sheila Hays is a 51 year old female with morbid obesity, hypertension, hyperlipidemia, chronic HFpEF, CKD 3B, who presented to the ED on 11/20/2021 for evaluation of shortness of breath and chest pain. Initial blood pressure 202/90 in the ED with otherwise normal vitals, SPO2 of 95% on room air.  Labs were mostly unremarkable but BNP elevated 580.8.  Chest x-ray with cardiomegaly, prominent central pulmonary vessels concerning for CHF and sepsis small/minimal bilateral pleural effusions. Blood pressure was treated with Vasotec 0.625 mg IV in the ED, also given furosemide 40 mg IV. 11/20/21: Admitted to the hospital for further evaluation management of acute on chronic HFpEF requiring IV diuresis, close monitoring and management of hypertensive urgency. 06/29-07/04: Underwent RHC 11/25/2021, severe pulmonary HTN, requiring lasix gtt. HTN improved. Trial BiPap for hypercapnic acidosis likely d/t OHS, plan for outpatient sleep study and likley needs home NIV 07/05: cardiology reduced lasix gtt from 6 to 4 mg/h, plan transition to po torsemide when euvolemic / if Cr worsens. Fall in bathroom, no head trauma, no concerns on serial neuro checks.  07/06: Cr bumped up today to 1.9, she did have decent UOP yesterday but only -319 daily net. Net IO Since Admission: -3,707.98 mL [11/28/21 0855] but po intake seems inaccurately measured over 07/03-07/04 so question true net output. GIven renal fxn, I stopped lasix gtt and ordered po torsemide. Await further recommendations from cardiology. Given fall yesterday, will add CK to AM labs though doubt rhabdo would be contributing to renal fxn as diuresis effect seems much more likely.  07/07: moved to ICU per cardiology recs, placed on milrinone drip for a few days with plan to start  low-dose Lasix drip, placed PICC line and monitor CVP. BP low, MAP low, started norepi  07/08: C/o chest pain, same as her usual sternal pain. Cardiology recs: continue milrinone. D/c sildenafil and carvedilol d/t hypotension.  Await Co. oximetry panel and CVP measurements understanding that a normal CVP is not the goal in the setting of right heart failure.  Consider resuming diuretic therapy tomorrow.  She will need sleep study as outpatient. Sill on milrinone and norepi this morning, BP improved. AM BMP showed improving Cr.         Consultants:  Cardiology   Procedures: 11/25/2021 R heart catheterization     Subjective: Pt denied CP/SOB, feeling tired but no lightheadedness/dizziness      ASSESSMENT & PLAN:   Principal Problem:   Acute on chronic heart failure with preserved ejection fraction (HFpEF) (HCC) Active Problems:   Hypertensive emergency   Essential hypertension   Chronic kidney disease, stage 3a ([place])   Complex partial seizure disorder ([place])   Morbid obesity ([place])   Chest pain   Dizziness   Acute respiratory failure with hypoxia (HCC)   Hypercapnic acidosis   Pulmonary hypertension (HCC)   Acute on chronic heart failure with preserved ejection fraction (HFpEF) ([place]), POA Echo on showed EF 55 to [id number], diastolic function could not be evaluated, mild LVH, left atrium mild to moderately dilated. 07/07 moved to ICU per cardiology recs, placed on milrinone drip, placed PICC line and monitor CVP. BP low, MAP low, started norepi --> improved 07/08 but remins on drips  07/08 continue milrinone. D/c sildenafil and carvedilol d/t hypotension.  Await Co. oximetry panel and CVP measurements understanding that a  normal CVP is not the goal in the setting of right heart failure. Consider resuming diuretic therapy 07/09.  She will need sleep study as outpatient.  BP improved.  AM BMP showed improving Cr.    Hypertensive emergency - resolved Initial BP 202/90, resulting in  pulmonary edema and decompensated CHF. lower suspicion for RAS    Pulmonary hypertension (HCC), severe Likely related to untreated OHS/OSA, morbid obesity.   Right Heart Cath on 7/3 --showed evidence of severely elevated right and left-sided filling pressures, severe pulmonary hypertension and normal cardiac output. 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  A.m. VBG on 7/2 with pH of 7.29, PCO2 of 85, HCO3 40.9 and PO2 of 70. Outpatient sleep study overnight BiPAP Patient likely needs home NIV   Acute respiratory failure with hypoxia (HCC) - resolved Patient presented with hypertensive heart failure and dyspnea.  Despite diuresis she continues to require oxygen.  Qualifies for home oxygen at 2 L/min.  Suspect due to underlying OHS, pulmonary HTN.   Home O2 ordered Supplemental O2, maintain sats >90% Sleep study recommended outpatient   Dizziness 7/1 - with BP control improved, patient having significant dizziness when up to ambulate.  She has had some soft BP's 99/52, 112/57.  Suspect orthostatic hypotension vs pt used to high baseline BP. Fall yesterday, see RN notes    Chest pain - recurrent  Atypical, has been reproducible on palpation, likely musculoskeletal etiology vs esophageal etiology.  Troponin very minimally elevated with flat trend and EKG is nonacute. Most likely demand ischemia. Not consistent with ACS. 7/1 - recurrent bout in AM, trop down to 18 from 28 and EKG remains normal. 7/8 - recurrent again, cardiology aware  Cardiology following Nitro paste discontinued Ischemic evaluation as outpatient   Morbid obesity (HCC) Body mass index is 46.9 kg/m. Complicates overall care and prognosis.  Recommend lifestyle modifications including physical activity and diet for weight loss and overall  long-term health.   Highly suspect OSA and/or OHS. Recommend sleep study as outpatient. Qualifies for home O2 here.   Complex partial seizure disorder (HCC) Resumed oxcarbamazepine 600 mg p.o. twice daily; Keppra 500 mg p.o. twice daily Resumed on Lamictal at 25 mg p.o. twice daily because lamictal requires gradual increase dosing Follow-up with primary care and/or neurology for further recommendations regarding Lamictal dosing Ativan 2 mg IV as needed for seizure, 2 doses ordered   Chronic kidney disease, stage 3a (HCC) Renal function near baseline on admission up trended with IV diuresis. Was improving since Lasix stopped. but climbing again   Essential hypertension Presented with hypertensive emergency with pulmonary edema and CHF decompensation.  BP now controlled but with intermittent hypotension.        DVT prophylaxis: Lovenox Code Status: FULL Family Communication: niece at bedside on rounds  Disposition Plan / TOC needs: likely back to previous home environment, may need PT/OT eval for [facility] vs SNF Barriers to discharge / significant pending items: cardiology recs appreciated re: goals and home rx going forward. Currently care has been escalated and patient is in ICU              Objective: Vitals:   11/30/21 0700 11/30/21 0800 11/30/21 0810 11/30/21 0900  BP: (!) 96/57 133/71 112/68 116/65  Pulse: 76 76 77 78  Resp: 19 17  17 16  Temp:  98.4 F (36.9 C)    TempSrc:  Oral    SpO2: 97% 98% 96% 98%  Weight:      Height:        Intake/Output Summary (Last 24 hours) at 11/30/2021 0930 Last data filed at 11/30/2021 0900 Gross per 24 hour  Intake 932.61 ml  Output 950 ml  Net -17.39 ml    Filed Weights   11/25/21 0300 11/26/21 0349 11/29/21 0423  Weight: 131.7 kg 128.2 kg 120.2 kg    Examination:  Constitutional:  VS as above General Appearance: alert, well-developed, well-nourished, NAD Eyes: Normal lids and conjunctive, non-icteric sclera Ears,  Nose, Mouth, Throat: Normal appearance Neck: No masses, trachea midline Respiratory: Normal respiratory effort Breath sounds normal, no wheeze/rhonchi/rales Cardiovascular: S1/S2 normal, no murmur/rub/gallop auscultated Trace lower extremity edema Gastrointestinal: Nontender, no masses - habitus limits exam  Musculoskeletal:  No clubbing/cyanosis of digits Neurological: No cranial nerve deficit on limited exam Motor and sensation intact and symmetric Psychiatric: Normal judgment/insight Normal mood and affect       Scheduled Medications:   vitamin C  500 mg Oral Daily   aspirin EC  81 mg Oral Daily   atorvastatin  40 mg Oral q1800   Chlorhexidine Gluconate Cloth  6 each Topical Daily   enoxaparin (LOVENOX) injection  0.5 mg/kg Subcutaneous Q24H   escitalopram  20 mg Oral q morning   folic acid  [id number] mcg Oral Daily   lamoTRIgine  25 mg Oral BID   levETIRAcetam  500 mg Oral BID   mometasone-formoterol  2 puff Inhalation BID   oxcarbazepine  600 mg Oral BID   pantoprazole  40 mg Oral Daily   sodium chloride flush  10-40 mL Intracatheter Q12H   sodium chloride flush  3 mL Intravenous Q12H   sodium chloride flush  3 mL Intravenous Q12H    Continuous Infusions:  sodium chloride     sodium chloride Stopped (11/30/21 0128)   milrinone 0.25 mcg/kg/min (11/30/21 0900)   norepinephrine (LEVOPHED) Adult infusion 5 mcg/min (11/30/21 0900)    PRN Medications:  sodium chloride, albuterol, butalbital-acetaminophen-caffeine, HYDROcodone-acetaminophen, linaclotide, LORazepam, ondansetron **OR** ondansetron (ZOFRAN) IV, mouth rinse, senna-docusate, sodium chloride flush, sodium chloride flush, traZODone  Antimicrobials:  Anti-infectives (From admission, onward)    None       Data Reviewed: I have personally reviewed following labs and imaging studies  CBC: Recent Labs  Lab 11/28/21 0625 11/29/21 0511  WBC 5.2 5.3  HGB 13.6 13.6  HCT 45.5 45.5  MCV 86.5 88.0  PLT  247 [id number]    Basic Metabolic Panel: Recent Labs  Lab 11/25/21 0520 11/26/21 0538 11/27/21 0551 11/28/21 0625 11/29/21 0511  NA 144 141 144 141 143  K 4.2 3.5 3.3* 3.5 3.9  CL 101 95* 91* 94* 98  CO2 35* 38* 39* 38* 37*  GLUCOSE 83 81 83 91 88  BUN 21* 22* 28* 34* 40*  CREATININE 1.41* 1.46* 1.66* 1.96* 2.26*  CALCIUM 8.9 9.2 9.7 9.4 9.0  MG  --  2.0 2.0  --   --     GFR: Estimated Creatinine Clearance: 38.5 mL/min (A) (by C-G formula based on SCr of 2.26 mg/dL (H)). Liver Function Tests: No results for input(s): "AST", "ALT", "ALKPHOS", "BILITOT", "PROT", "ALBUMIN" in the last 168 hours. No results for input(s): "LIPASE", "AMYLASE" in the last 168 hours. No results for input(s): "AMMONIA" in the last 168 hours. Coagulation Profile: No results for input(s): "INR", "PROTIME" in the  last 168 hours. Cardiac Enzymes: Recent Labs  Lab 11/28/21 0625  CKTOTAL 38    BNP (last 3 results) No results for input(s): "PROBNP" in the last 8760 hours. HbA1C: No results for input(s): "HGBA1C" in the last 72 hours. CBG: Recent Labs  Lab 11/24/21 2023 11/25/21 0721 11/25/21 1139 11/29/21 1451  GLUCAP 112* 89 84 90    Lipid Profile: Recent Labs    11/28/21 1324  CHOL 117  HDL 50  LDLCALC 51  TRIG 82  CHOLHDL 2.3    Thyroid Function Tests: No results for input(s): "TSH", "T4TOTAL", "FREET4", "T3FREE", "THYROIDAB" in the last 72 hours. Anemia Panel: No results for input(s): "VITAMINB12", "FOLATE", "FERRITIN", "TIBC", "IRON", "RETICCTPCT" in the last 72 hours. Urine analysis:    Component Value Date/Time   COLORURINE YELLOW (A) 06/14/2021 1830   APPEARANCEUR CLEAR (A) 06/14/2021 1830   LABSPEC 1.011 06/14/2021 1830   PHURINE 5.0 06/14/2021 1830   GLUCOSEU NEGATIVE 06/14/2021 1830   HGBUR NEGATIVE 06/14/2021 1830   BILIRUBINUR NEGATIVE 06/14/2021 1830   KETONESUR NEGATIVE 06/14/2021 1830   PROTEINUR 100 (A) 06/14/2021 1830   NITRITE NEGATIVE 06/14/2021 1830    LEUKOCYTESUR LARGE (A) 06/14/2021 1830   Sepsis Labs: @LABRCNTIP (procalcitonin:4,lacticidven:4)  No results found for this or any previous visit (from the past 240 hour(s)).       Radiology Studies last 96 hours: DG Chest Port 1 View  Result Date: 11/30/2021 CLINICAL DATA:  PICC placement EXAM: PORTABLE CHEST 1 VIEW COMPARISON:  11/20/2021 FINDINGS: Right upper extremity central venous catheter tip overlies the brachiocephalic region. Mild cardiomegaly. Low lung volume. No acute airspace disease. IMPRESSION: 1. Right upper extremity central venous catheter tip overlies the brachiocephalic region. 2. Cardiomegaly Electronically Signed   By: 11/22/2021 M.D.   On: 11/30/2021 00:30   01/31/2022 EKG SITE RITE  Result Date: 11/29/2021 If Site Rite image not attached, placement could not be confirmed due to current cardiac rhythm.           LOS: 9 days    01/30/2022, DO [facility] 11/30/2021, 9:30 AM   Staff may message me via secure chat in Epic  but this may not receive immediate response,  please page for urgent matters!  If 7PM-7AM, please contact night-coverage [email or web address]  Dictation software was used to generate the above note. Typos may occur and escape review, as with typed/written notes. Please contact Dr 01/31/2022 directly for clarity if needed.

## 2021-11-30 NOTE — Progress Notes (Addendum)
Lengthy assessment of PICC line per consult.  At bedside at 1640 -PICC able to be flushed but resistant and has blood return.  RUE with "fluffy" adipose tissue, suspect probable source of improper functioning of the PICC.  Attempted to  infuse meds via white and grey ports, but the pump continued to alarm occlusion.  After assessing the anatomy of BUE, uncertain that a new PICC placement would be a solution. Instructed Angela RN on position of arm as was for PICC placement- abducted at 90 degree angle would be best option for use.  Pt and family at bedside verbalize understanding of complications and do not want a new PICC placed at this time. Notified Dr Lyn Hollingshead re the problem solving and difficulties with the PICC via telephone call.  Aware of options including CVC via IJ or subclavian or IR to place a central access, as possibility of same issue arising on either UE.  States she will notify Dr Tenny Craw with cardiology re the PICC and CVP readings.

## 2021-11-30 NOTE — Progress Notes (Signed)
An USGPIV (ultrasound guided PIV) has been placed for short-term vasopressor infusion. A correctly placed ivWatch must be used when administering Vasopressors. Should this treatment be needed beyond 72 hours, central line access should be obtained.  It will be the responsibility of the bedside nurse to follow best practice to prevent extravasations.   ?

## 2021-11-30 NOTE — Progress Notes (Signed)
Xray done for PICC line position. Tip overlies the brachiocephalic region. NP and Dr Para March made aware. PICC line exchange order put in. Ultrasound guided IV inserted peripherally for levophed and milrinone whilst waiting for PICC line to be exchange.

## 2021-11-30 NOTE — Progress Notes (Addendum)
Spoke w/ IV team RN Aggie Cosier - see her note as well. Sounds like PICC is not a viable option at the moment and pt has declined repeat attempt at PICC today anyway. Would consider central line via IJ or subclavian but not sure benefit is worth risk without speaking w/ cardiology. IR to place central PICC access may also be a option. Will ask Dr Tenny Craw for her opinion, there is no apparent emergent need for central access but having CVP readings would be ideal help guide treatment. Spoke to Dr Tenny Craw, agrees can defer central access for now and reassess in am / prn.

## 2021-11-30 NOTE — Progress Notes (Addendum)
Cardiology Progress Note   Patient Name: Sheila Hays Date of Encounter: 11/30/2021  Primary Cardiologist: Peter Martinique, MD  Subjective   Cont to complain of intermittent chest wall pain and soreness.  No significant change in breathing since starting milrinone.  Inpatient Medications    Scheduled Meds:  vitamin C  500 mg Oral Daily   aspirin EC  81 mg Oral Daily   atorvastatin  40 mg Oral q1800   Chlorhexidine Gluconate Cloth  6 each Topical Daily   enoxaparin (LOVENOX) injection  0.5 mg/kg Subcutaneous Q24H   escitalopram  20 mg Oral q morning   folic acid  [id number] mcg Oral Daily   lamoTRIgine  25 mg Oral BID   levETIRAcetam  500 mg Oral BID   mometasone-formoterol  2 puff Inhalation BID   oxcarbazepine  600 mg Oral BID   pantoprazole  40 mg Oral Daily   potassium chloride  40 mEq Oral Once   sodium chloride flush  10-40 mL Intracatheter Q12H   sodium chloride flush  3 mL Intravenous Q12H   sodium chloride flush  3 mL Intravenous Q12H   Continuous Infusions:  sodium chloride     sodium chloride Stopped (11/30/21 0128)   milrinone 0.25 mcg/kg/min (11/30/21 0900)   norepinephrine (LEVOPHED) Adult infusion 5 mcg/min (11/30/21 0900)   PRN Meds: sodium chloride, albuterol, butalbital-acetaminophen-caffeine, HYDROcodone-acetaminophen, linaclotide, LORazepam, ondansetron **OR** ondansetron (ZOFRAN) IV, mouth rinse, senna-docusate, sodium chloride flush, sodium chloride flush, traZODone   Vital Signs    Vitals:   11/30/21 0945 11/30/21 1000 11/30/21 1015 11/30/21 1030  BP: 122/71 119/70 (!) 110/57 (!) 97/58  Pulse: 83 81 86 84  Resp: 16 20 15 19   Temp:      TempSrc:      SpO2: 97% 96% 96% 96%  Weight:      Height:        Intake/Output Summary (Last 24 hours) at 11/30/2021 1143 Last data filed at 11/30/2021 1000 Gross per 24 hour  Intake 872.61 ml  Output 1250 ml  Net -377.39 ml   Filed Weights   11/25/21 0300 11/26/21 0349 11/29/21 0423  Weight: 131.7 kg 128.2  kg 120.2 kg    Physical Exam   GEN: morbidly obese, in no acute distress.  HEENT: Grossly normal.  Neck: Supple, obese, difficult to gauge JVP, no carotid bruits, or masses. Cardiac: RRR, no murmurs, rubs, or gallops. No clubbing, cyanosis, edema.  Radials 2+, DP/PT 2+ and equal bilaterally.  Respiratory:  Respirations regular and unlabored, bibasilar crackles with faint expiratory wheezing. GI: Obese, soft, nontender, nondistended, BS + x 4. MS: no deformity or atrophy. Skin: warm and dry, no rash. Neuro:  Strength and sensation are intact. Psych: AAOx3.  Normal affect.  Labs    Chemistry Recent Labs  Lab 11/28/21 0625 11/29/21 0511 11/30/21 1002  NA 141 143 139  K 3.5 3.9 3.4*  CL 94* 98 94*  CO2 38* 37* 33*  GLUCOSE 91 88 129*  BUN 34* 40* 38*  CREATININE 1.96* 2.26* 1.84*  CALCIUM 9.4 9.0 9.4  GFRNONAA 30* 25* 33*  ANIONGAP 9 8 12      Hematology Recent Labs  Lab 11/28/21 0625 11/29/21 0511  WBC 5.2 5.3  RBC 5.26* 5.17*  HGB 13.6 13.6  HCT 45.5 45.5  MCV 86.5 88.0  MCH 25.9* 26.3  MCHC 29.9* 29.9*  RDW 15.8* 15.9*  PLT 247 236    Cardiac Enzymes  Recent Labs  Lab 11/20/21 1352 11/20/21 1634 11/21/21  1019 11/23/21 0942  TROPONINIHS 23* 21* 28* 18*      BNP    Component Value Date/Time   BNP 580.8 (H) 11/20/2021 1352   Lipids  Lab Results  Component Value Date   CHOL 117 11/28/2021   HDL 50 11/28/2021   LDLCALC 51 11/28/2021   TRIG 82 11/28/2021   CHOLHDL 2.3 11/28/2021    Radiology    DG Chest Port 1 View  Result Date: 11/30/2021 CLINICAL DATA:  PICC placement EXAM: PORTABLE CHEST 1 VIEW COMPARISON:  11/20/2021 FINDINGS: Right upper extremity central venous catheter tip overlies the brachiocephalic region. Mild cardiomegaly. Low lung volume. No acute airspace disease. IMPRESSION: 1. Right upper extremity central venous catheter tip overlies the brachiocephalic region. 2. Cardiomegaly Electronically Signed   By: Jasmine Pang M.D.   On:  11/30/2021 00:30   [place] EKG SITE RITE  Result Date: 11/29/2021 If Site Rite image not attached, placement could not be confirmed due to current cardiac rhythm.   Telemetry    Regular sinus rhythm- Personally Reviewed  Cardiac Studies   2D Echocardiogram 6.29.2023   1. Left ventricular ejection fraction, by estimation, is 55 to 60%. The  left ventricle has normal function. The left ventricle has no regional  wall motion abnormalities. The left ventricular internal cavity size was  mildly dilated. There is mild left  ventricular hypertrophy. Left ventricular diastolic function could not be  evaluated.   2. Right ventricular systolic function is normal. The right ventricular  size is not well visualized.   3. Left atrial size was mild to moderately dilated.   4. The mitral valve is normal in structure. No evidence of mitral valve  regurgitation.   5. The aortic valve was not well visualized. Aortic valve regurgitation  is not visualized.   6. The inferior vena cava is normal in size with <50% respiratory  variability, suggesting right atrial pressure of 8 mmHg.  _____________    Right heart cath  11/25/21   RA: 22 mmHg RV: 92/18 with an end-diastolic pressure of 32 mmHg. PA: 95/44 with a mean of 64 mmHg PCW: 30 mmHg  Cardiac output: 7.4 with an index of 3.16. Pulmonary vascular resistance: 4.5 Woods units  Patient Profile     51 y.o. female with history of HFpEF, morbid obesity, CKD 3, and hypertension who was admitted 6/28 w/ dyspnea, chest pain, and volume overload.  Assessment & Plan    1.  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  Echo this admission with an EF of 55 to 60%.  Right heart catheterization on July 3 with marked elevated filling pressures (PA 95/44, PCWP 30).  Lasix drip discontinued July 6 due to rising creatinine, and switch to oral torsemide, of which she received 1 dose prior to [place] discontinuing due to ongoing elevation  of creatinine, which reached 2.26 on July 7.  In that setting, will transfer to the unit out of concern for low output right ventricular failure and pulmonary hypertension.  Due to complications with the PICC line, a car oximetry panel has yet to be drawn however, her renal function has improved on milrinone, with a BUN of 38 and creatinine of 1.84 this morning.  Body habitus continues to make volume status very difficult to gauge.  I did discontinue carvedilol and sildenafil this AM due to hypotension requiring initiation of norepinephrine last night.  Rec continue milrinone at currently dose and will avoid adding back diuretic at this time,  as we suspect she will begin diuresing with improved forward flow.  Await Co. oximetry panel and CVP measurements understanding that a normal CVP is not our goal in the setting of right heart failure.  We will consider resuming diuretic therapy tomorrow.  She will need sleep study as outpatient.  2.  Essential hypertension/hypotension: Patient became hypotensive last night, and pressures have been soft ever since adding sildenafil.  This is now discontinued.  Carvedilol also on hold.  We previously held irbesartan secondary to rising creatinine.  3.  Demand ischemia/chest wall pain: In setting of CHF, high-sensitivity troponin peaked at 28 with a relatively flat trend.  Likely represents demand ischemia in the setting of volume overload.  Echo with normal LVEF.  She continues to report chest wall pain and tenderness for which she is receiving oral analgesics.  Lipids within normal limits.  Consider outpatient Lexiscan PET/CT.  Continue aspirin and statin therapy.  Nitrate on hold in the setting of previous sildenafil usage and current hypotension.  4.  Acute on chronic stage III kidney disease: Creatinine rose to 2.26 on July 7, up from a baseline of approximately 1.6.  Last dose of diuretic was on the morning of July 6.  ARB and SGLT2 inhibitor on hold.  Creatinine  improved with initiation of milrinone and is 1.84 this morning with a BUN of 38.  Continue to follow with inotropic therapy.  5.  Hypokalemia: Supplement.  Signed, Nicolasa Ducking, NP  11/30/2021, 11:43 AM    Patient seen and examined   I agree with findings as noted by C Berge above  Pt resting comfortably in bed   ALmost flat. Neck is full Lungs are relatively clear Cardiac exam  RRR  No S3  prominent S2 Abd  Mild RUQ tenderness Ext with tr edema   Has pneumatic compression on   Pt with HFpEF and severe  pulmonary HTN  PAP at RHC 95/44  PCWP 30     As noted lasix was d/c'd due to rise in Cr.    Now on milrinone to help with forward output   Cr has improved some on this   1.84      Did receive sildenafil yesteday  May have lead to hypotension requiring pressors   This and carvedilol was d/c'd today      I have reviewed her echo here and compared to 1 year ago     Echo one year ago was normal  LV/RV size normal  LVEF and RVEF were normal Echo on 6/29  RV is minimally larger, if at all.  It appears mildly hypertrophied and with very mild dysfunction   There is some septal flattening consistent with high pressures.    Overall, not the echo really typical for such severe pulmonary HTN       Agree with trial of milrinone   Taper pressors   Reassess renal function in am I would get LE dopplers just to exclude DVT.  Low prob, but do not want to miss.  Patient should have sleep study as outpt  On 2=3 L at night   Dietrich Pates MD   For questions or updates, please contact   Please consult [email or web address] for contact info under Cardiology/STEMI.

## 2021-11-30 NOTE — Progress Notes (Signed)
Peripherally Inserted Central Catheter Placement  The IV Nurse has discussed with the patient and/or persons authorized to consent for the patient, the purpose of this procedure and the potential benefits and risks involved with this procedure.  The benefits include less needle sticks, lab draws from the catheter, and the patient may be discharged home with the catheter. Risks include, but not limited to, infection, bleeding, blood clot (thrombus formation), and puncture of an artery; nerve damage and irregular heartbeat and possibility to perform a PICC exchange if needed/ordered by physician.  Alternatives to this procedure were also discussed.  Bard Power PICC patient education guide, fact sheet on infection prevention and patient information card has been provided to patient /or left at bedside.    PICC Placement Documentation  PICC Triple Lumen 11/29/21 Right Brachial 44 cm 0 cm (Active)  Indication for Insertion or Continuance of Line Vasoactive infusions 11/29/21 2000  Exposed Catheter (cm) 0 cm 11/29/21 1600  Site Assessment Clean, Dry, Intact 11/29/21 2114  Lumen #1 Status Infusing;Flushed;Blood return noted 11/29/21 2114  Lumen #2 Status In-line blood sampling system in place;Blood return noted 11/29/21 2114  Lumen #3 Status Occluded 11/29/21 2114  Dressing Type Transparent;Securing device 11/29/21 2114  Dressing Status Antimicrobial disc in place;Clean, Dry, Intact 11/29/21 2114  Line Care Connections checked and tightened;Zeroed and calibrated 11/29/21 2000  Dressing Change Due 12/06/21 11/29/21 2114     PICC Triple Lumen 11/30/21 Right Cephalic 48 cm 1 cm (Active)  Indication for Insertion or Continuance of Line Vasoactive infusions;Prolonged intravenous therapies 11/30/21 1353  Exposed Catheter (cm) 1 cm 11/30/21 1353  Site Assessment Clean, Dry, Intact 11/30/21 1353  Lumen #1 Status Flushed;Saline locked;Blood return noted 11/30/21 1353  Lumen #2 Status Flushed;Saline  locked;Blood return noted 11/30/21 1353  Lumen #3 Status Flushed;Saline locked;Blood return noted 11/30/21 1353  Dressing Type Transparent;Securing device 11/30/21 1353  Dressing Status Antimicrobial disc in place;Clean, Dry, Intact 11/30/21 1353  Safety Lock Not Applicable 11/30/21 1353  Line Care Connections checked and tightened 11/30/21 1353  Line Adjustment (NICU/IV Team Only) No 11/30/21 1353  Dressing Intervention New dressing 11/30/21 1353  Dressing Change Due 12/07/21 11/30/21 1353       Elliot Dally 11/30/2021, 1:54 PM

## 2021-12-01 DIAGNOSIS — I5033 Acute on chronic diastolic (congestive) heart failure: Secondary | ICD-10-CM | POA: Diagnosis not present

## 2021-12-01 LAB — CBC
HCT: 43.2 % (ref 36.0–46.0)
Hemoglobin: 13 g/dL (ref 12.0–15.0)
MCH: 26.5 pg (ref 26.0–34.0)
MCHC: 30.1 g/dL (ref 30.0–36.0)
MCV: 88.2 fL (ref 80.0–100.0)
Platelets: 212 10*3/uL (ref 150–400)
RBC: 4.9 MIL/uL (ref 3.87–5.11)
RDW: 15.7 % — ABNORMAL HIGH (ref 11.5–15.5)
WBC: 5.1 10*3/uL (ref 4.0–10.5)
nRBC: 0 % (ref 0.0–0.2)

## 2021-12-01 LAB — BASIC METABOLIC PANEL
Anion gap: 8 (ref 5–15)
BUN: 36 mg/dL — ABNORMAL HIGH (ref 6–20)
CO2: 36 mmol/L — ABNORMAL HIGH (ref 22–32)
Calcium: 9.1 mg/dL (ref 8.9–10.3)
Chloride: 97 mmol/L — ABNORMAL LOW (ref 98–111)
Creatinine, Ser: 1.82 mg/dL — ABNORMAL HIGH (ref 0.44–1.00)
GFR, Estimated: 33 mL/min — ABNORMAL LOW (ref >60)
Glucose, Bld: 97 mg/dL (ref 70–99)
Potassium: 4 mmol/L (ref 3.5–5.1)
Sodium: 141 mmol/L (ref 135–145)

## 2021-12-01 MED ORDER — FUROSEMIDE 10 MG/ML IJ SOLN
80.0000 mg | Freq: Once | INTRAMUSCULAR | Status: AC
  Administered 2021-12-01: 80 mg via INTRAVENOUS
  Filled 2021-12-01: qty 8

## 2021-12-01 NOTE — Progress Notes (Signed)
Cardiology Progress Note   Patient Name: Sheila Hays Date of Encounter: 12/01/2021  Primary Cardiologist: Peter Martinique, MD  Subjective   Pt is breathing OK in bed  No CP  Inpatient Medications    Scheduled Meds:  vitamin C  500 mg Oral Daily   aspirin EC  81 mg Oral Daily   atorvastatin  40 mg Oral q1800   Chlorhexidine Gluconate Cloth  6 each Topical Daily   enoxaparin (LOVENOX) injection  0.5 mg/kg Subcutaneous Q24H   escitalopram  20 mg Oral q morning   folic acid  [id number] mcg Oral Daily   lamoTRIgine  25 mg Oral BID   levETIRAcetam  500 mg Oral BID   mometasone-formoterol  2 puff Inhalation BID   oxcarbazepine  600 mg Oral BID   pantoprazole  40 mg Oral Daily   sodium chloride flush  10-40 mL Intracatheter Q12H   sodium chloride flush  3 mL Intravenous Q12H   sodium chloride flush  3 mL Intravenous Q12H   Continuous Infusions:  sodium chloride     sodium chloride Stopped (11/30/21 0128)   milrinone 0.25 mcg/kg/min (12/01/21 0800)   norepinephrine (LEVOPHED) Adult infusion 3 mcg/min (12/01/21 0800)   PRN Meds: sodium chloride, albuterol, butalbital-acetaminophen-caffeine, HYDROcodone-acetaminophen, linaclotide, LORazepam, ondansetron **OR** ondansetron (ZOFRAN) IV, mouth rinse, senna-docusate, sodium chloride flush, sodium chloride flush, traZODone   Vital Signs    Vitals:   12/01/21 0730 12/01/21 0745 12/01/21 0800 12/01/21 0815  BP: (!) 113/56 (!) 106/51 (!) 86/50 (!) 92/51  Pulse: 70 71 74 69  Resp: 18 19 16 17   Temp:    98.2 F (36.8 C)  TempSrc:    Oral  SpO2: 99% 98% 99% 97%  Weight:      Height:        Intake/Output Summary (Last 24 hours) at 12/01/2021 0853 Last data filed at 12/01/2021 0800 Gross per 24 hour  Intake 633.57 ml  Output 900 ml  Net -266.43 ml   Net negative 3.4 L     Filed Weights   11/25/21 0300 11/26/21 0349 11/29/21 0423  Weight: 131.7 kg 128.2 kg 120.2 kg    Physical Exam   GEN: morbidly obese, in no acute distress.   HEENT: Grossly normal.  Neck: Unable to assess JVP   Cardiac: RRR, no murmurs  Tr LE edeam Respiratory:  Respirations regular and unlabored, relatively clear to auscul  No wheezes  GI: Obese, soft, nontender, nondistended, BS + x 4. MS: no deformity or atrophy Neuro:  Grossly intact  Labs    Chemistry Recent Labs  Lab 11/29/21 0511 11/30/21 1002 12/01/21 0547  NA 143 139 141  K 3.9 3.4* 4.0  CL 98 94* 97*  CO2 37* 33* 36*  GLUCOSE 88 129* 97  BUN 40* 38* 36*  CREATININE 2.26* 1.84* 1.82*  CALCIUM 9.0 9.4 9.1  GFRNONAA 25* 33* 33*  ANIONGAP 8 12 8      Hematology Recent Labs  Lab 11/28/21 0625 11/29/21 0511 12/01/21 0547  WBC 5.2 5.3 5.1  RBC 5.26* 5.17* 4.90  HGB 13.6 13.6 13.0  HCT 45.5 45.5 43.2  MCV 86.5 88.0 88.2  MCH 25.9* 26.3 26.5  MCHC 29.9* 29.9* 30.1  RDW 15.8* 15.9* 15.7*  PLT 247 236 212    Cardiac Enzymes  Recent Labs  Lab 11/20/21 1352 11/20/21 1634 11/21/21 1019 11/23/21 0942  TROPONINIHS 23* 21* 28* 18*      BNP    Component Value Date/Time   BNP  580.8 (H) 11/20/2021 1352   Lipids  Lab Results  Component Value Date   CHOL 117 11/28/2021   HDL 50 11/28/2021   LDLCALC 51 11/28/2021   TRIG 82 11/28/2021   CHOLHDL 2.3 11/28/2021    Radiology    DG Chest Port 1 View  Result Date: 11/30/2021 CLINICAL DATA:  PICC placement EXAM: PORTABLE CHEST 1 VIEW COMPARISON:  11/20/2021 FINDINGS: Right upper extremity central venous catheter tip overlies the brachiocephalic region. Mild cardiomegaly. Low lung volume. No acute airspace disease. IMPRESSION: 1. Right upper extremity central venous catheter tip overlies the brachiocephalic region. 2. Cardiomegaly Electronically Signed   By: Jasmine Pang M.D.   On: 11/30/2021 00:30   [place] EKG SITE RITE  Result Date: 11/29/2021 If Site Rite image not attached, placement could not be confirmed due to current cardiac rhythm.   Telemetry    SR - Personally Reviewed  Cardiac Studies   2D  Echocardiogram 6.29.2023   1. Left ventricular ejection fraction, by estimation, is 55 to 60%. The  left ventricle has normal function. The left ventricle has no regional  wall motion abnormalities. The left ventricular internal cavity size was  mildly dilated. There is mild left  ventricular hypertrophy. Left ventricular diastolic function could not be  evaluated.   2. Right ventricular systolic function is normal. The right ventricular  size is not well visualized.   3. Left atrial size was mild to moderately dilated.   4. The mitral valve is normal in structure. No evidence of mitral valve  regurgitation.   5. The aortic valve was not well visualized. Aortic valve regurgitation  is not visualized.   6. The inferior vena cava is normal in size with <50% respiratory  variability, suggesting right atrial pressure of 8 mmHg.  _____________    Right heart cath  11/25/21   RA: 22 mmHg RV: 92/18 with an end-diastolic pressure of 32 mmHg. PA: 95/44 with a mean of 64 mmHg PCW: 30 mmHg  Cardiac output: 7.4 with an index of 3.16. Pulmonary vascular resistance: 4.5 Woods units  Patient Profile     51 y.o. female with history of HFpEF, morbid obesity, CKD 3, and hypertension who was admitted 6/28 w/ dyspnea, chest pain, and volume overload.  Assessment & Plan    1.  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  Echo this admission with an EF of 55 to 60%.  Right heart catheterization on July 3 with marked elevated filling pressures (PA 95/44, PCWP 30).  Lasix drip discontinued July 6 due to rising creatinine  Or switched to oral torsemide, She  received 1 dose and it was stopped when Cr went up to 2.26    No on milrinone    Carvedilol and sildenafil discontinued           I have reviewed her echo here and compared to 1 year ago     Echo one year ago was normal  LV/RV size normal  LVEF and RVEF were normal Echo on 6/29  RV is minimally larger, if at all.   It appears mildly hypertrophied and with very mild dysfunction   There is some septal flattening consistent with high pressures.    Overall, not the echo really typical for such severe pulmonary HTN       Pt is almost off of levo  Nurse says her bp goes down when she gets pain meds Unfortunately pt does not have PICC line inserted  Problems yesterday   Line clotted     Not trusting any readings  Will keep on current milrinone for now and give one dose of IV lasix   Strict I/O             LE dopplers done today   2.  Essential hypertension/hypotension: BP improving Goes down when gets pain meds  Recomm taper levo to off and follow   3.  Demand ischemia/chest wall pain: In setting of CHF, high-sensitivity troponin peaked at 28 with a relatively flat trend.  Likely represents demand ischemia in the setting of volume overload. Pt without CP now   4.  Acute on chronic stage III kidney disease: Cr peak at 2.26 on 7/7   It is 36 and 1.82 today.  Last dose of diuretic was on the morning of July 6.  Will dose x 1 and follow    5  ? OSA  Pt needs a sleep study   May be contrib to Pulmonary HTN    Signed, Dietrich Pates, MD  12/01/2021, 8:53 AM Dietrich Pates MD   For questions or updates, please contact   Please consult [email or web address] for contact info under Cardiology/STEMI.

## 2021-12-01 NOTE — Progress Notes (Signed)
Discussed with IV team on call tonight : PICC line occluded.  I suspect due to anatomy . There is no intervention that will resolve this issue at this time . Trouble shooting exhausted. Will defer to MD in am. For now will use PIV .

## 2021-12-01 NOTE — Progress Notes (Signed)
PROGRESS NOTE    Sheila Hays  [id number] DOB: [birth year]  DOA: 11/20/2021 Date of Service: 12/01/21 PCP: [facility] Narrative / Hospital Course:  Sheila Hays is a 51 year old female with morbid obesity, hypertension, hyperlipidemia, chronic HFpEF, CKD 3B, who presented to the ED on 11/20/2021 for evaluation of shortness of breath and chest pain. Initial blood pressure 202/90 in the ED with otherwise normal vitals, SPO2 of 95% on room air.  Labs were mostly unremarkable but BNP elevated 580.8.  Chest x-ray with cardiomegaly, prominent central pulmonary vessels concerning for CHF and sepsis small/minimal bilateral pleural effusions. Blood pressure was treated with Vasotec 0.625 mg IV in the ED, also given furosemide 40 mg IV. 11/20/21: Admitted to the hospital for further evaluation management of acute on chronic HFpEF requiring IV diuresis, close monitoring and management of hypertensive urgency. 06/29-07/04: Underwent RHC 11/25/2021, severe pulmonary HTN, requiring lasix gtt. HTN improved. Trial BiPap for hypercapnic acidosis likely d/t OHS, plan for outpatient sleep study and likley needs home NIV 07/05: cardiology reduced lasix gtt from 6 to 4 mg/h, plan transition to po torsemide when euvolemic / if Cr worsens. Fall in bathroom, no head trauma, no concerns on serial neuro checks.  07/06: Cr bumped up today to 1.9, she did have decent UOP yesterday but only -319 daily net. Net IO Since Admission: -3,707.98 mL [11/28/21 0855] but po intake seems inaccurately measured over 07/03-07/04 so question true net output. GIven renal fxn, I stopped lasix gtt and ordered po torsemide. Await further recommendations from cardiology. Given fall yesterday, will add CK to AM labs though doubt rhabdo would be contributing to renal fxn as diuresis effect seems much more likely.  07/07: moved to ICU per cardiology recs, placed on milrinone drip for a few days with plan to start  low-dose Lasix drip, placed PICC line and monitor CVP. BP low, MAP low, started norepi  07/08: C/o chest pain, same as her usual sternal pain. Cardiology recs: continue milrinone. D/c sildenafil and carvedilol d/t hypotension.  Await Co. oximetry panel and CVP measurements (understanding that a normal CVP is not the goal in the setting of right heart failure).  Consider resuming diuretic therapy tomorrow. [place]ill on milrinone and norepi this morning, BP improved. AM BMP showed improving Cr. PICC occluded, failed exchange, d/w Dr Harrington Challenger, may consider central access btu will hold off for now unless it becomes emergent  07/09: Cr stable from yesterday. BP low but holding on norepi. No PICC/Central access planned as of now. Cardiology following. For now remains on milrinone and norepinephrine. Of note, I discussed w/ patient inability to measure CVP may be an issue but for now there is not an urgent need for central acces, would defer to cardiology / may need to place emergent access if her condition acutely worsens. PT states would be ok w/ central line if absolutely needed but prefers to avoid this.         Consultants:  Cardiology   Procedures: 11/25/2021 R heart catheterization     Subjective: Pt denied CP/SOB, feeling tired but no lightheadedness/dizziness      ASSESSMENT & PLAN:   Principal Problem:   Acute on chronic heart failure with preserved ejection fraction (HFpEF) (HCC) Active Problems:   Hypertensive emergency   Essential hypertension   Chronic kidney disease, stage 3a ([place])   Complex partial seizure disorder ([place])   Morbid obesity ([place])   Chest pain   Dizziness   Acute respiratory  failure with hypoxia (HCC)   Hypercapnic acidosis   Pulmonary hypertension (HCC)   Acute on chronic heart failure with preserved ejection fraction (HFpEF) ([place]), POA Echo on showed EF 55 to [id number], diastolic function could not be evaluated, mild LVH, left atrium mild to moderately  dilated. 07/07 moved to ICU per cardiology recs, placed on milrinone drip, placed PICC line and monitor CVP. BP low, MAP low, started norepi --> improved 07/08 but remins on drips  07/08 continue milrinone. D/c sildenafil and carvedilol d/t hypotension. Unable to monitor CVP as cannot obtain access. Per cardiology, Consider resuming diuretic therapy 07/09.   will need sleep study as outpatient.  BP improved but still low and on norepi AM BMP showed improving Cr yesterday and is about the same today. .    Hypertensive emergency - resolved Initial BP 202/90, resulting in pulmonary edema and decompensated CHF. lower suspicion for RAS    Pulmonary hypertension ([place]), severe Likely related to untreated OHS/OSA, morbid obesity.   Right Heart Cath on 7/3 --showed evidence of severely elevated right and left-sided filling pressures, severe pulmonary hypertension and normal cardiac output. 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  A.m. VBG on 7/2 with pH of 7.29, PCO2 of 85, HCO3 40.9 and PO2 of 70. Outpatient sleep study overnight BiPAP Patient likely needs home NIV   Acute respiratory failure with hypoxia ([place]) - resolved Patient presented with hypertensive heart failure and dyspnea.  Despite diuresis she continues to require oxygen.  Qualifies for home oxygen at 2 L/min.  Suspect due to underlying OHS, pulmonary HTN.   Home O2 ordered Supplemental O2, maintain sats >90% Sleep study recommended outpatient   Dizziness 7/1 - with BP control improved, patient having significant dizziness when up to ambulate.  She has had some soft BP's 99/52, 112/57.  Suspect orthostatic hypotension vs pt used to high baseline BP. Fall yesterday, see RN notes    Chest pain - recurrent  Atypical, has been reproducible on palpation, likely  musculoskeletal etiology vs esophageal etiology.  Troponin very minimally elevated with flat trend and EKG is nonacute. Most likely demand ischemia. Not consistent with ACS. 7/1 - recurrent bout in AM, trop down to 18 from 28 and EKG remains normal. 7/8 - recurrent again, cardiology aware  Cardiology following Nitro paste discontinued Ischemic evaluation as outpatient   Morbid obesity ([place]) Body mass index is 46.9 kg/m. Complicates overall care and prognosis.  Recommend lifestyle modifications including physical activity and diet for weight loss and overall long-term health.   Highly suspect OSA and/or OHS. Recommend sleep study as outpatient. Qualifies for home O2 here.   Complex partial seizure disorder (HCC) Resumed oxcarbamazepine 600 mg p.o. twice daily; Keppra 500 mg p.o. twice daily Resumed on Lamictal at 25 mg p.o. twice daily because lamictal requires gradual increase dosing Follow-up with primary care and/or neurology for further recommendations regarding Lamictal dosing Ativan 2 mg IV as needed for seizure, 2 doses ordered   Chronic kidney disease, stage 3a ([place]) Renal function near baseline on admission up trended with IV diuresis. Was improving since Lasix stopped. but climbing again   Essential hypertension Presented with hypertensive emergency with pulmonary edema and CHF decompensation.  BP now controlled but with intermittent hypotension.        DVT prophylaxis: Lovenox Code Status: FULL  Family Communication: niece at bedside, she will be going back to [facility] and requests call w/ any updates Disposition Plan / TOC needs: likely back to previous home environment, may need PT/OT eval for [facility] vs SNF once stable, suspect may need SNF Barriers to discharge / significant pending items: cardiology recs appreciated re: goals and home rx going forward. Currently care has been escalated and patient is in ICU on milrinone and norepi drips               Objective: Vitals:   12/01/21 0945 12/01/21 1000 12/01/21 1015 12/01/21 1030  BP: (!) 117/57 121/78 102/66 (!) 96/51  Pulse: 75 81 79 79  Resp: 16 14 12 15   Temp:      TempSrc:      SpO2: 100% 98% 99% 97%  Weight:      Height:        Intake/Output Summary (Last 24 hours) at 12/01/2021 1050 Last data filed at 12/01/2021 1000 Gross per 24 hour  Intake 721.32 ml  Output 1100 ml  Net -378.68 ml   Filed Weights   11/25/21 0300 11/26/21 0349 11/29/21 0423  Weight: 131.7 kg 128.2 kg 120.2 kg    Examination:  Constitutional:  VS as above General Appearance: alert, well-developed, well-nourished, NAD Eyes: Normal lids and conjunctive, non-icteric sclera Ears, Nose, Mouth, Throat: Normal appearance Neck: No masses, trachea midline Respiratory: Normal respiratory effort Breath sounds normal, no wheeze/rhonchi/rales Cardiovascular: S1/S2 normal, no murmur/rub/gallop auscultated Trace lower extremity edema Gastrointestinal: Nontender, no masses - habitus limits exam  Musculoskeletal:  No clubbing/cyanosis of digits Neurological: No cranial nerve deficit on limited exam Motor and sensation intact and symmetric Psychiatric: Normal judgment/insight Normal mood and affect       Scheduled Medications:   vitamin C  500 mg Oral Daily   aspirin EC  81 mg Oral Daily   atorvastatin  40 mg Oral q1800   Chlorhexidine Gluconate Cloth  6 each Topical Daily   enoxaparin (LOVENOX) injection  0.5 mg/kg Subcutaneous Q24H   escitalopram  20 mg Oral q morning   folic acid  500 mcg Oral Daily   lamoTRIgine  25 mg Oral BID   levETIRAcetam  500 mg Oral BID   mometasone-formoterol  2 puff Inhalation BID   oxcarbazepine  600 mg Oral BID   pantoprazole  40 mg Oral Daily   sodium chloride flush  10-40 mL Intracatheter Q12H   sodium chloride flush  3 mL Intravenous Q12H   sodium chloride flush  3 mL Intravenous Q12H    Continuous Infusions:  sodium chloride      sodium chloride Stopped (11/30/21 0128)   milrinone 0.25 mcg/kg/min (12/01/21 1000)   norepinephrine (LEVOPHED) Adult infusion 2 mcg/min (12/01/21 1000)    PRN Medications:  sodium chloride, albuterol, butalbital-acetaminophen-caffeine, HYDROcodone-acetaminophen, linaclotide, LORazepam, ondansetron **OR** ondansetron (ZOFRAN) IV, mouth rinse, senna-docusate, sodium chloride flush, sodium chloride flush, traZODone  Antimicrobials:  Anti-infectives (From admission, onward)    None       Data Reviewed: I have personally reviewed following labs and imaging studies  CBC: Recent Labs  Lab 11/28/21 0625 11/29/21 0511 12/01/21 0547  WBC 5.2 5.3 5.1  HGB 13.6 13.6 13.0  HCT 45.5 45.5 43.2  MCV 86.5 88.0 88.2  PLT 247 236 212   Basic Metabolic Panel: Recent Labs  Lab 11/26/21 0538 11/27/21 0551 11/28/21 0625 11/29/21 0511 11/30/21 1002 12/01/21 0547  NA 141 144 141 143 139 141  K 3.5 3.3* 3.5 3.9 3.4*  4.0  CL 95* 91* 94* 98 94* 97*  CO2 38* 39* 38* 37* 33* 36*  GLUCOSE 81 83 91 88 129* 97  BUN 22* 28* 34* 40* 38* 36*  CREATININE 1.46* 1.66* 1.96* 2.26* 1.84* 1.82*  CALCIUM 9.2 9.7 9.4 9.0 9.4 9.1  MG 2.0 2.0  --   --   --   --    GFR: Estimated Creatinine Clearance: 47.8 mL/min (A) (by C-G formula based on SCr of 1.82 mg/dL (H)). Liver Function Tests: No results for input(s): "AST", "ALT", "ALKPHOS", "BILITOT", "PROT", "ALBUMIN" in the last 168 hours. No results for input(s): "LIPASE", "AMYLASE" in the last 168 hours. No results for input(s): "AMMONIA" in the last 168 hours. Coagulation Profile: No results for input(s): "INR", "PROTIME" in the last 168 hours. Cardiac Enzymes: Recent Labs  Lab 11/28/21 0625  CKTOTAL 38   BNP (last 3 results) No results for input(s): "PROBNP" in the last 8760 hours. HbA1C: No results for input(s): "HGBA1C" in the last 72 hours. CBG: Recent Labs  Lab 11/24/21 2023 11/25/21 0721 11/25/21 1139 11/29/21 1451  GLUCAP 112*  89 84 90   Lipid Profile: Recent Labs    11/28/21 1324  CHOL 117  HDL 50  LDLCALC 51  TRIG 82  CHOLHDL 2.3   Thyroid Function Tests: No results for input(s): "TSH", "T4TOTAL", "FREET4", "T3FREE", "THYROIDAB" in the last 72 hours. Anemia Panel: No results for input(s): "VITAMINB12", "FOLATE", "FERRITIN", "TIBC", "IRON", "RETICCTPCT" in the last 72 hours. Urine analysis:    Component Value Date/Time   COLORURINE YELLOW (A) 06/14/2021 1830   APPEARANCEUR CLEAR (A) 06/14/2021 1830   LABSPEC 1.011 06/14/2021 1830   PHURINE 5.0 06/14/2021 1830   GLUCOSEU NEGATIVE 06/14/2021 1830   HGBUR NEGATIVE 06/14/2021 1830   BILIRUBINUR NEGATIVE 06/14/2021 1830   KETONESUR NEGATIVE 06/14/2021 1830   PROTEINUR 100 (A) 06/14/2021 1830   NITRITE NEGATIVE 06/14/2021 1830   LEUKOCYTESUR LARGE (A) 06/14/2021 1830   Sepsis Labs: @LABRCNTIP (procalcitonin:4,lacticidven:4)  No results found for this or any previous visit (from the past 240 hour(s)).       Radiology Studies last 96 hours: DG Chest Port 1 View  Result Date: 11/30/2021 CLINICAL DATA:  PICC placement EXAM: PORTABLE CHEST 1 VIEW COMPARISON:  11/20/2021 FINDINGS: Right upper extremity central venous catheter tip overlies the brachiocephalic region. Mild cardiomegaly. Low lung volume. No acute airspace disease. IMPRESSION: 1. Right upper extremity central venous catheter tip overlies the brachiocephalic region. 2. Cardiomegaly Electronically Signed   By: 11/22/2021 M.D.   On: 11/30/2021 00:30   01/31/2022 EKG SITE RITE  Result Date: 11/29/2021 If Site Rite image not attached, placement could not be confirmed due to current cardiac rhythm.           LOS: 10 days    01/30/2022, DO [facility] 12/01/2021, 10:50 AM   Staff may message me via secure chat in Epic  but this may not receive immediate response,  please page for urgent matters!  If 7PM-7AM, please contact night-coverage [email or web address]  Dictation software  was used to generate the above note. Typos may occur and escape review, as with typed/written notes. Please contact Dr 02/01/2022 directly for clarity if needed.

## 2021-12-01 NOTE — Progress Notes (Signed)
Well documented issues with access and inable to use for any reason. Unable to obtain CVP. Cardiologist and [facility] aware

## 2021-12-01 NOTE — Plan of Care (Signed)
  Problem: Education: Goal: Ability to demonstrate management of disease process will improve Outcome: Progressing Goal: Ability to verbalize understanding of medication therapies will improve Outcome: Progressing Goal: Individualized Educational Video(s) Outcome: Progressing   Problem: Activity: Goal: Capacity to carry out activities will improve Outcome: Progressing   Problem: Cardiac: Goal: Ability to achieve and maintain adequate cardiopulmonary perfusion will improve Outcome: Progressing   Problem: Education: Goal: Knowledge of General Education information will improve Description: Including pain rating scale, medication(s)/side effects and non-pharmacologic comfort measures Outcome: Progressing   Problem: Health Behavior/Discharge Planning: Goal: Ability to manage health-related needs will improve Outcome: Progressing   Problem: Clinical Measurements: Goal: Ability to maintain clinical measurements within normal limits will improve Outcome: Progressing Goal: Will remain free from infection Outcome: Progressing Goal: Diagnostic test results will improve Outcome: Progressing Goal: Respiratory complications will improve Outcome: Progressing Goal: Cardiovascular complication will be avoided Outcome: Progressing   Problem: Activity: Goal: Risk for activity intolerance will decrease Outcome: Progressing   Problem: Elimination: Goal: Will not experience complications related to bowel motility Outcome: Progressing Goal: Will not experience complications related to urinary retention Outcome: Progressing   Problem: Nutrition: Goal: Adequate nutrition will be maintained Outcome: Progressing   Problem: Coping: Goal: Level of anxiety will decrease Outcome: Progressing   Problem: Elimination: Goal: Will not experience complications related to bowel motility Outcome: Progressing Goal: Will not experience complications related to urinary retention Outcome:  Progressing   Problem: Safety: Goal: Ability to remain free from injury will improve Outcome: Progressing   Problem: Skin Integrity: Goal: Risk for impaired skin integrity will decrease Outcome: Progressing   Problem: Health Behavior/Discharge Planning: Goal: Ability to safely manage health-related needs after discharge will improve Outcome: Progressing

## 2021-12-02 DIAGNOSIS — I5033 Acute on chronic diastolic (congestive) heart failure: Secondary | ICD-10-CM | POA: Diagnosis not present

## 2021-12-02 LAB — CBC
HCT: 43.4 % (ref 36.0–46.0)
Hemoglobin: 13.1 g/dL (ref 12.0–15.0)
MCH: 26.5 pg (ref 26.0–34.0)
MCHC: 30.2 g/dL (ref 30.0–36.0)
MCV: 87.7 fL (ref 80.0–100.0)
Platelets: 219 10*3/uL (ref 150–400)
RBC: 4.95 MIL/uL (ref 3.87–5.11)
RDW: 15.6 % — ABNORMAL HIGH (ref 11.5–15.5)
WBC: 5.3 10*3/uL (ref 4.0–10.5)
nRBC: 0 % (ref 0.0–0.2)

## 2021-12-02 LAB — COOXEMETRY PANEL
Carboxyhemoglobin: 1.2 % (ref 0.5–1.5)
Methemoglobin: 1.2 % (ref 0.0–1.5)
O2 Saturation: 39.5 %
Total hemoglobin: 14.1 g/dL (ref 12.0–16.0)
Total oxygen content: 38.6 %

## 2021-12-02 LAB — BASIC METABOLIC PANEL
Anion gap: 9 (ref 5–15)
BUN: 34 mg/dL — ABNORMAL HIGH (ref 6–20)
CO2: 36 mmol/L — ABNORMAL HIGH (ref 22–32)
Calcium: 9.1 mg/dL (ref 8.9–10.3)
Chloride: 96 mmol/L — ABNORMAL LOW (ref 98–111)
Creatinine, Ser: 2 mg/dL — ABNORMAL HIGH (ref 0.44–1.00)
GFR, Estimated: 30 mL/min — ABNORMAL LOW (ref >60)
Glucose, Bld: 91 mg/dL (ref 70–99)
Potassium: 3.8 mmol/L (ref 3.5–5.1)
Sodium: 141 mmol/L (ref 135–145)

## 2021-12-02 NOTE — Progress Notes (Signed)
PROGRESS NOTE    Sheila Hays  [id number] DOB: July 06, 1970  DOA: 11/20/2021 Date of Service: 12/02/21 PCP: [facility] Narrative / Hospital Course:  Sheila Hays is a 51 year old female with morbid obesity, hypertension, hyperlipidemia, chronic HFpEF, CKD 3B, who presented to the ED on 11/20/2021 for evaluation of shortness of breath and chest pain. Initial blood pressure 202/90 in the ED with otherwise normal vitals, SPO2 of 95% on room air.  Labs were mostly unremarkable but BNP elevated 580.8.  Chest x-ray with cardiomegaly, prominent central pulmonary vessels concerning for CHF and sepsis small/minimal bilateral pleural effusions. Blood pressure was treated with Vasotec 0.625 mg IV in the ED, also given furosemide 40 mg IV. 11/20/21: Admitted to the hospital for further evaluation management of acute on chronic HFpEF requiring IV diuresis, close monitoring and management of hypertensive urgency. 06/29-07/04: Underwent RHC 11/25/2021, severe pulmonary HTN, requiring lasix gtt. HTN improved. Trial BiPap for hypercapnic acidosis likely d/t OHS, plan for outpatient sleep study and likley needs home NIV 07/05: cardiology reduced lasix gtt from 6 to 4 mg/h, plan transition to po torsemide when euvolemic / if Cr worsens. Fall in bathroom, no head trauma, no concerns on serial neuro checks.  07/06: Cr bumped up today to 1.9, she did have decent UOP yesterday but only -319 daily net. Net IO Since Admission: -3,707.98 mL [11/28/21 0855] but po intake seems inaccurately measured over 07/03-07/04 so question true net output. GIven renal fxn, I stopped lasix gtt and ordered po torsemide. Await further recommendations from cardiology. Given fall yesterday, will add CK to AM labs though doubt rhabdo would be contributing to renal fxn as diuresis effect seems much more likely.  07/07: moved to ICU per cardiology recs, placed on milrinone drip for a few days with plan to start  low-dose Lasix drip, placed PICC line and monitor CVP. BP low, MAP low, started norepi  07/08: C/o chest pain, same as her usual sternal pain. Cardiology recs: continue milrinone. D/c sildenafil and carvedilol d/t hypotension.  Await Co. oximetry panel and CVP measurements (understanding that a normal CVP is not the goal in the setting of right heart failure).  Consider resuming diuretic therapy tomorrow. Sill on milrinone and norepi this morning, BP improved. AM BMP showed improving Cr. PICC occluded, failed exchange, d/w Dr Harrington Challenger, may consider central access btu will hold off for now unless it becomes emergent  07/09: Cr stable from yesterday. BP low but holding on norepi. No PICC/Central access planned as of now. Cardiology following. For now remains on milrinone and norepinephrine. Of note, I discussed w/ patient inability to measure CVP may be an issue but for now there is not an urgent need for central acces, would defer to cardiology / may need to place emergent access if her condition acutely worsens. PT states would be ok w/ central line if absolutely needed but prefers to avoid this.  Received 1 dose IV Lasix. 07/10: Weaned off norepi.  BP stable.  Remains off carvedilol, sildenafil.        Consultants:  Cardiology   Procedures: 11/25/2021 R heart catheterization     Subjective: Pt denied CP/SOB, feeling tired     ASSESSMENT & PLAN:   Principal Problem:   Acute on chronic heart failure with preserved ejection fraction (HFpEF) (HCC) Active Problems:   Hypertensive emergency   Essential hypertension   Chronic kidney disease, stage 3a ([place])   Complex partial seizure disorder ([place])   Morbid  obesity ([place])   Chest pain   Dizziness   Acute respiratory failure with hypoxia (HCC)   Hypercapnic acidosis   Pulmonary hypertension (HCC)   Acute on chronic heart failure with preserved ejection fraction (HFpEF) ([place]), POA Echo on showed EF 55 to [id number], diastolic function could not  be evaluated, mild LVH, left atrium mild to moderately dilated. 07/07 moved to ICU per cardiology recs, placed on milrinone drip, placed PICC line and monitor CVP. Hypotensive, needed norepi --> off this lat 07/09  07/08-07/10 continuing milrinone.  D/c sildenafil and carvedilol d/t hypotension.  Unable to monitor CVP as cannot obtain access. will need sleep study as outpatient.  BP improved but still low and on norepi   AKI on Chronic kidney disease, stage 3a ([place]) Renal function near baseline on admission up trended with IV diuresis.  Was improving since Lasix stopped. but climbing again Trend BMP Unable to give fluids d/t cardiac issues May need to involve nephrology if not improving tomorrow off diuresis   Pulmonary hypertension ([place]), severe Likely related to untreated OHS/OSA, morbid obesity.   Right Heart Cath on 7/3 --showed evidence of severely elevated right and left-sided filling pressures, severe pulmonary hypertension and normal cardiac output. Initially was on Lasix drip which was d/c d/t AKI ICU on milrinone drip, renal fxn improving  Sildenafil added but held d/t hypotension     Hypertensive emergency - resolved Initial BP 202/90, resulting in pulmonary edema and decompensated CHF. 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  A.m. VBG on 7/2 with pH of 7.29, PCO2 of 85, HCO3 40.9 and PO2 of 70. Outpatient sleep study overnight BiPAP Patient likely needs home NIV   Acute respiratory failure with hypoxia ([place]) - resolved Patient presented with hypertensive heart failure and dyspnea.  Despite diuresis she continues to require oxygen.  Qualifies for home oxygen at 2 L/min.  Suspect due to underlying OHS, pulmonary HTN.   Home O2 ordered Supplemental O2, maintain sats >90% Sleep study recommended outpatient   Dizziness 7/1 - with BP control improved, patient having significant dizziness when up to ambulate.  She  has had some soft BP's 99/52, 112/57.  Suspect orthostatic hypotension vs pt used to high baseline BP. Fall yesterday, see RN notes    Chest pain - recurrent  Atypical, has been reproducible on palpation, likely musculoskeletal etiology vs esophageal etiology.  Troponin very minimally elevated with flat trend and EKG is nonacute. Most likely demand ischemia. Not consistent with ACS. 7/1 - recurrent bout in AM, trop down to 18 from 28 and EKG remains normal. 7/8 - recurrent again, cardiology aware  Cardiology following Nitro paste discontinued Ischemic evaluation as outpatient   Morbid obesity ([place]) Body mass index is 46.9 kg/m. Complicates overall care and prognosis.  Recommend lifestyle modifications including physical activity and diet for weight loss and overall long-term health.   Highly suspect OSA and/or OHS. Recommend sleep study as outpatient. Qualifies for home O2 here.   Complex partial seizure disorder (HCC) Resumed oxcarbamazepine 600 mg p.o. twice daily; Keppra 500 mg p.o. twice daily Resumed on Lamictal at 25 mg p.o. twice daily because lamictal requires gradual increase dosing Follow-up with primary care and/or neurology for further recommendations regarding Lamictal dosing Ativan 2 mg IV as needed for seizure, 2 doses ordered   Essential hypertension Presented with hypertensive emergency with pulmonary edema and CHF decompensation.  BP now controlled but with intermittent hypotension.  DVT prophylaxis: Lovenox Code Status: FULL Family Communication: pt declined call to family at this time  Disposition Plan / TOC needs: likely back to previous home environment, may need PT/OT eval for [facility] vs SNF once stable, suspect may need SNF Barriers to discharge / significant pending items: cardiology recs appreciated re: goals and home rx going forward. Currently care has been escalated and patient is in ICU on milrinone and norepi drips               Objective: Vitals:   12/02/21 1000 12/02/21 1100 12/02/21 1200 12/02/21 1505  BP: 101/63 (!) 99/56 110/61 102/67  Pulse: 80 81 76 71  Resp: 18 17 15 15   Temp:   98.3 F (36.8 C)   TempSrc:   Oral   SpO2: 95% 97% 96% 95%  Weight:      Height:        Intake/Output Summary (Last 24 hours) at 12/02/2021 1529 Last data filed at 12/02/2021 1500 Gross per 24 hour  Intake 844.52 ml  Output 1650 ml  Net -805.48 ml    Filed Weights   11/25/21 0300 11/26/21 0349 11/29/21 0423  Weight: 131.7 kg 128.2 kg 120.2 kg    Examination:  Constitutional:  VS as above General Appearance: alert, well-developed, well-nourished, NAD Neck: No masses, trachea midline Respiratory: Normal respiratory effort Cardiovascular: Trace lower extremity edema Gastrointestinal: Nontender, no masses - habitus limits exam  Musculoskeletal:  No clubbing/cyanosis of digits Neurological: No cranial nerve deficit on limited exam Motor and sensation intact and symmetric Psychiatric: Normal judgment/insight Normal mood and affect       Scheduled Medications:   vitamin C  500 mg Oral Daily   aspirin EC  81 mg Oral Daily   atorvastatin  40 mg Oral q1800   Chlorhexidine Gluconate Cloth  6 each Topical Daily   enoxaparin (LOVENOX) injection  0.5 mg/kg Subcutaneous Q24H   escitalopram  20 mg Oral q morning   folic acid  500 mcg Oral Daily   lamoTRIgine  25 mg Oral BID   levETIRAcetam  500 mg Oral BID   mometasone-formoterol  2 puff Inhalation BID   oxcarbazepine  600 mg Oral BID   pantoprazole  40 mg Oral Daily   sodium chloride flush  10-40 mL Intracatheter Q12H   sodium chloride flush  3 mL Intravenous Q12H   sodium chloride flush  3 mL Intravenous Q12H    Continuous Infusions:  sodium chloride     sodium chloride Stopped (11/30/21 0128)   milrinone 0.25 mcg/kg/min (12/02/21 1500)    PRN Medications:  sodium chloride, albuterol, butalbital-acetaminophen-caffeine,  HYDROcodone-acetaminophen, linaclotide, LORazepam, ondansetron **OR** ondansetron (ZOFRAN) IV, mouth rinse, senna-docusate, sodium chloride flush, sodium chloride flush, traZODone  Antimicrobials:  Anti-infectives (From admission, onward)    None       Data Reviewed: I have personally reviewed following labs and imaging studies  CBC: Recent Labs  Lab 11/28/21 0625 11/29/21 0511 12/01/21 0547 12/02/21 0830  WBC 5.2 5.3 5.1 5.3  HGB 13.6 13.6 13.0 13.1  HCT 45.5 45.5 43.2 43.4  MCV 86.5 88.0 88.2 87.7  PLT 247 236 212 219    Basic Metabolic Panel: Recent Labs  Lab 11/26/21 0538 11/27/21 0551 11/28/21 0625 11/29/21 0511 11/30/21 1002 12/01/21 0547 12/02/21 0830  NA 141 144 141 143 139 141 141  K 3.5 3.3* 3.5 3.9 3.4* 4.0 3.8  CL 95* 91* 94* 98 94* 97* 96*  CO2 38* 39* 38* 37* 33* 36* 36*  GLUCOSE 81 83 91 88 129* 97 91  BUN 22* 28* 34* 40* 38* 36* 34*  CREATININE 1.46* 1.66* 1.96* 2.26* 1.84* 1.82* 2.00*  CALCIUM 9.2 9.7 9.4 9.0 9.4 9.1 9.1  MG 2.0 2.0  --   --   --   --   --     GFR: Estimated Creatinine Clearance: 43.5 mL/min (A) (by C-G formula based on SCr of 2 mg/dL (H)). Liver Function Tests: No results for input(s): "AST", "ALT", "ALKPHOS", "BILITOT", "PROT", "ALBUMIN" in the last 168 hours. No results for input(s): "LIPASE", "AMYLASE" in the last 168 hours. No results for input(s): "AMMONIA" in the last 168 hours. Coagulation Profile: No results for input(s): "INR", "PROTIME" in the last 168 hours. Cardiac Enzymes: Recent Labs  Lab 11/28/21 0625  CKTOTAL 38    BNP (last 3 results) No results for input(s): "PROBNP" in the last 8760 hours. HbA1C: No results for input(s): "HGBA1C" in the last 72 hours. CBG: Recent Labs  Lab 11/29/21 1451  GLUCAP 90    Lipid Profile: No results for input(s): "CHOL", "HDL", "LDLCALC", "TRIG", "CHOLHDL", "LDLDIRECT" in the last 72 hours.  Thyroid Function Tests: No results for input(s): "TSH", "T4TOTAL",  "FREET4", "T3FREE", "THYROIDAB" in the last 72 hours. Anemia Panel: No results for input(s): "VITAMINB12", "FOLATE", "FERRITIN", "TIBC", "IRON", "RETICCTPCT" in the last 72 hours. Urine analysis:    Component Value Date/Time   COLORURINE YELLOW (A) 06/14/2021 1830   APPEARANCEUR CLEAR (A) 06/14/2021 1830   LABSPEC 1.011 06/14/2021 1830   PHURINE 5.0 06/14/2021 1830   GLUCOSEU NEGATIVE 06/14/2021 1830   HGBUR NEGATIVE 06/14/2021 1830   BILIRUBINUR NEGATIVE 06/14/2021 1830   KETONESUR NEGATIVE 06/14/2021 1830   PROTEINUR 100 (A) 06/14/2021 1830   NITRITE NEGATIVE 06/14/2021 1830   LEUKOCYTESUR LARGE (A) 06/14/2021 1830   Sepsis Labs: @LABRCNTIP (procalcitonin:4,lacticidven:4)  No results found for this or any previous visit (from the past 240 hour(s)).       Radiology Studies last 96 hours: US Venous Img Lower Bilateral (DVT)  Result Date: 12/02/2021 CLINICAL DATA:  Bilateral lower extremity pain.  Evaluate for DVT. EXAM: BILATERAL LOWER EXTREMITY VENOUS DOPPLER ULTRASOUND TECHNIQUE: Gray-scale sonography with graded compression, as well as color Doppler and duplex ultrasound were performed to evaluate the lower extremity deep venous systems from the level of the common femoral vein and including the common femoral, femoral, profunda femoral, popliteal and calf veins including the posterior tibial, peroneal and gastrocnemius veins when visible. The superficial great saphenous vein was also interrogated. Spectral Doppler was utilized to evaluate flow at rest and with distal augmentation maneuvers in the common femoral, femoral and popliteal veins. COMPARISON:  Left lower extremity venous Doppler ultrasound-09/22/2020 (negative). FINDINGS: Examination is degraded due to patient body habitus and poor sonographic window. RIGHT LOWER EXTREMITY Common Femoral Vein: No evidence of thrombus. Normal compressibility, respiratory phasicity and response to augmentation. Saphenofemoral Junction: No  evidence of thrombus. Normal compressibility and flow on color Doppler imaging. Profunda Femoral Vein: No evidence of thrombus. Normal compressibility and flow on color Doppler imaging. Femoral Vein: No evidence of thrombus. Normal compressibility, respiratory phasicity and response to augmentation. Popliteal Vein: No evidence of thrombus. Normal compressibility, respiratory phasicity and response to augmentation. Calf Veins: No evidence of thrombus. Normal compressibility and flow on color Doppler imaging. Superficial Great Saphenous Vein: No evidence of thrombus. Normal compressibility. Other Findings:  None. LEFT LOWER EXTREMITY Common Femoral Vein: No evidence of thrombus. Normal compressibility, respiratory phasicity and response to augmentation. Saphenofemoral Junction: No evidence  of thrombus. Normal compressibility and flow on color Doppler imaging. Profunda Femoral Vein: No evidence of thrombus. Normal compressibility and flow on color Doppler imaging. Femoral Vein: No evidence of thrombus. Normal compressibility, respiratory phasicity and response to augmentation. Popliteal Vein: No evidence of thrombus. Normal compressibility, respiratory phasicity and response to augmentation. Calf Veins: Appear patent where visualized. Superficial Great Saphenous Vein: No evidence of thrombus. Normal compressibility. Other Findings:  None. IMPRESSION: No evidence of DVT within either lower extremity. Electronically Signed   By: Simonne Come M.D.   On: 12/02/2021 08:13   DG Chest Port 1 View  Result Date: 11/30/2021 CLINICAL DATA:  PICC placement EXAM: PORTABLE CHEST 1 VIEW COMPARISON:  11/20/2021 FINDINGS: Right upper extremity central venous catheter tip overlies the brachiocephalic region. Mild cardiomegaly. Low lung volume. No acute airspace disease. IMPRESSION: 1. Right upper extremity central venous catheter tip overlies the brachiocephalic region. 2. Cardiomegaly Electronically Signed   By: Jasmine Pang M.D.    On: 11/30/2021 00:30   [place] EKG SITE RITE  Result Date: 11/29/2021 If Site Rite image not attached, placement could not be confirmed due to current cardiac rhythm.           LOS: 11 days    Sunnie Nielsen, DO [facility] 12/02/2021, 3:29 PM   Staff may message me via secure chat in Epic  but this may not receive immediate response,  please page for urgent matters!  If 7PM-7AM, please contact night-coverage [email or web address]  Dictation software was used to generate the above note. Typos may occur and escape review, as with typed/written notes. Please contact Dr Lyn Hollingshead directly for clarity if needed.

## 2021-12-02 NOTE — Progress Notes (Signed)
Unable to obtain weight this morning due to malfunction of bed scale: informed day shift RN to obtain when pt is up in chair

## 2021-12-02 NOTE — Progress Notes (Signed)
Cardiology Progress Note   Patient Name: Sheila Hays Date of Encounter: 12/02/2021  Primary Cardiologist: Peter Martinique, MD  Subjective   PICC line is not working and thus not able to obtain a CVP.  The patient reports improvement in shortness of breath.  She required norepinephrine drip over the weekend but that weaned off.  Interestingly, she developed significant hypotension with milrinone that required stopping antihypertensive medications.  Inpatient Medications    Scheduled Meds:  vitamin C  500 mg Oral Daily   aspirin EC  81 mg Oral Daily   atorvastatin  40 mg Oral q1800   Chlorhexidine Gluconate Cloth  6 each Topical Daily   enoxaparin (LOVENOX) injection  0.5 mg/kg Subcutaneous Q24H   escitalopram  20 mg Oral q morning   folic acid  [id number] mcg Oral Daily   lamoTRIgine  25 mg Oral BID   levETIRAcetam  500 mg Oral BID   mometasone-formoterol  2 puff Inhalation BID   oxcarbazepine  600 mg Oral BID   pantoprazole  40 mg Oral Daily   sodium chloride flush  10-40 mL Intracatheter Q12H   sodium chloride flush  3 mL Intravenous Q12H   sodium chloride flush  3 mL Intravenous Q12H   Continuous Infusions:  sodium chloride     sodium chloride Stopped (11/30/21 0128)   milrinone 0.25 mcg/kg/min (12/02/21 1100)   norepinephrine (LEVOPHED) Adult infusion Stopped (12/01/21 1512)   PRN Meds: sodium chloride, albuterol, butalbital-acetaminophen-caffeine, HYDROcodone-acetaminophen, linaclotide, LORazepam, ondansetron **OR** ondansetron (ZOFRAN) IV, mouth rinse, senna-docusate, sodium chloride flush, sodium chloride flush, traZODone   Vital Signs    Vitals:   12/02/21 0900 12/02/21 1000 12/02/21 1100 12/02/21 1200  BP: 113/65 101/63 (!) 99/56 110/61  Pulse: 75 80 81 76  Resp: (!) 8 18 17 15   Temp:      TempSrc:      SpO2: 97% 95% 97%   Weight:      Height:        Intake/Output Summary (Last 24 hours) at 12/02/2021 1320 Last data filed at 12/02/2021 1100 Gross per 24  hour  Intake 843.29 ml  Output 1875 ml  Net -1031.71 ml    Net negative 3.4 L     Filed Weights   11/25/21 0300 11/26/21 0349 11/29/21 0423  Weight: 131.7 kg 128.2 kg 120.2 kg    Physical Exam   GEN: morbidly obese, in no acute distress.  HEENT: Grossly normal.  Neck: Unable to assess JVP   Cardiac: RRR, no murmurs  Tr LE edeam Respiratory:  Respirations regular and unlabored, relatively clear to auscul  No wheezes  GI: Obese, soft, nontender, nondistended, BS + x 4. MS: no deformity or atrophy Neuro:  Grossly intact  Labs    Chemistry Recent Labs  Lab 11/30/21 1002 12/01/21 0547 12/02/21 0830  NA 139 141 141  K 3.4* 4.0 3.8  CL 94* 97* 96*  CO2 33* 36* 36*  GLUCOSE 129* 97 91  BUN 38* 36* 34*  CREATININE 1.84* 1.82* 2.00*  CALCIUM 9.4 9.1 9.1  GFRNONAA 33* 33* 30*  ANIONGAP 12 8 9       Hematology Recent Labs  Lab 11/29/21 0511 12/01/21 0547 12/02/21 0830  WBC 5.3 5.1 5.3  RBC 5.17* 4.90 4.95  HGB 13.6 13.0 13.1  HCT 45.5 43.2 43.4  MCV 88.0 88.2 87.7  MCH 26.3 26.5 26.5  MCHC 29.9* 30.1 30.2  RDW 15.9* 15.7* 15.6*  PLT 236 212 219     Cardiac  Enzymes  Recent Labs  Lab 11/20/21 1352 11/20/21 1634 11/21/21 1019 11/23/21 0942  TROPONINIHS 23* 21* 28* 18*       BNP    Component Value Date/Time   BNP 580.8 (H) 11/20/2021 1352   Lipids  Lab Results  Component Value Date   CHOL 117 11/28/2021   HDL 50 11/28/2021   LDLCALC 51 11/28/2021   TRIG 82 11/28/2021   CHOLHDL 2.3 11/28/2021    Radiology    US Venous Img Lower Bilateral (DVT)  Result Date: 12/02/2021 CLINICAL DATA:  Bilateral lower extremity pain.  Evaluate for DVT. EXAM: BILATERAL LOWER EXTREMITY VENOUS DOPPLER ULTRASOUND TECHNIQUE: Gray-scale sonography with graded compression, as well as color Doppler and duplex ultrasound were performed to evaluate the lower extremity deep venous systems from the level of the common femoral vein and including the common femoral,  femoral, profunda femoral, popliteal and calf veins including the posterior tibial, peroneal and gastrocnemius veins when visible. The superficial great saphenous vein was also interrogated. Spectral Doppler was utilized to evaluate flow at rest and with distal augmentation maneuvers in the common femoral, femoral and popliteal veins. COMPARISON:  Left lower extremity venous Doppler ultrasound-09/22/2020 (negative). FINDINGS: Examination is degraded due to patient body habitus and poor sonographic window. RIGHT LOWER EXTREMITY Common Femoral Vein: No evidence of thrombus. Normal compressibility, respiratory phasicity and response to augmentation. Saphenofemoral Junction: No evidence of thrombus. Normal compressibility and flow on color Doppler imaging. Profunda Femoral Vein: No evidence of thrombus. Normal compressibility and flow on color Doppler imaging. Femoral Vein: No evidence of thrombus. Normal compressibility, respiratory phasicity and response to augmentation. Popliteal Vein: No evidence of thrombus. Normal compressibility, respiratory phasicity and response to augmentation. Calf Veins: No evidence of thrombus. Normal compressibility and flow on color Doppler imaging. Superficial Great Saphenous Vein: No evidence of thrombus. Normal compressibility. Other Findings:  None. LEFT LOWER EXTREMITY Common Femoral Vein: No evidence of thrombus. Normal compressibility, respiratory phasicity and response to augmentation. Saphenofemoral Junction: No evidence of thrombus. Normal compressibility and flow on color Doppler imaging. Profunda Femoral Vein: No evidence of thrombus. Normal compressibility and flow on color Doppler imaging. Femoral Vein: No evidence of thrombus. Normal compressibility, respiratory phasicity and response to augmentation. Popliteal Vein: No evidence of thrombus. Normal compressibility, respiratory phasicity and response to augmentation. Calf Veins: Appear patent where visualized. Superficial  Great Saphenous Vein: No evidence of thrombus. Normal compressibility. Other Findings:  None. IMPRESSION: No evidence of DVT within either lower extremity. Electronically Signed   By: Simonne Come M.D.   On: 12/02/2021 08:13   DG Chest Port 1 View  Result Date: 11/30/2021 CLINICAL DATA:  PICC placement EXAM: PORTABLE CHEST 1 VIEW COMPARISON:  11/20/2021 FINDINGS: Right upper extremity central venous catheter tip overlies the brachiocephalic region. Mild cardiomegaly. Low lung volume. No acute airspace disease. IMPRESSION: 1. Right upper extremity central venous catheter tip overlies the brachiocephalic region. 2. Cardiomegaly Electronically Signed   By: Jasmine Pang M.D.   On: 11/30/2021 00:30   [place] EKG SITE RITE  Result Date: 11/29/2021 If Site Rite image not attached, placement could not be confirmed due to current cardiac rhythm.   Telemetry    SR - Personally Reviewed  Cardiac Studies   2D Echocardiogram 6.29.2023   1. Left ventricular ejection fraction, by estimation, is 55 to 60%. The  left ventricle has normal function. The left ventricle has no regional  wall motion abnormalities. The left ventricular internal cavity size was  mildly dilated. There is mild  left  ventricular hypertrophy. Left ventricular diastolic function could not be  evaluated.   2. Right ventricular systolic function is normal. The right ventricular  size is not well visualized.   3. Left atrial size was mild to moderately dilated.   4. The mitral valve is normal in structure. No evidence of mitral valve  regurgitation.   5. The aortic valve was not well visualized. Aortic valve regurgitation  is not visualized.   6. The inferior vena cava is normal in size with <50% respiratory  variability, suggesting right atrial pressure of 8 mmHg.  _____________    Right heart cath  11/25/21   RA: 22 mmHg RV: 92/18 with an end-diastolic pressure of 32 mmHg. PA: 95/44 with a mean of 64 mmHg PCW: 30 mmHg  Cardiac  output: 7.4 with an index of 3.16. Pulmonary vascular resistance: 4.5 [place] units  Patient Profile     51 y.o. female with history of HFpEF, morbid obesity, CKD 3, and hypertension who was admitted 6/28 w/ dyspnea, chest pain, and volume overload.  Assessment & Plan    1.  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  Echo this admission with an EF of 55 to 60%.  Right heart catheterization on July 3 with marked elevated filling pressures (PA 95/44, PCWP 30).  Lasix drip discontinued July 6 due to rising creatinine  Or switched to oral torsemide, She  received 1 dose and it was stopped when Cr went up to 2.26    Clinically, seems to be better but renal function did not return to baseline.  She received 1 dose of IV Lasix yesterday with good urine output. The plan is to keep her on milrinone another day and likely stop tomorrow and start oral torsemide. Both carvedilol and sildenafil were discontinued due to hypotension.  Blood pressure is now stable.    I agree that her echocardiogram did not show significant RV dysfunction.  However, by right heart catheterization, there was severe pulmonary hypertension and severely elevated LVEDP.   2.  Essential hypertension/hypotension: Off norepinephrine drip after antihypertensive medications were stopped.  3.  Demand ischemia/chest wall pain: In setting of CHF, high-sensitivity troponin peaked at 28 with a relatively flat trend.  Likely represents demand ischemia in the setting of volume overload. Pt without CP now   4.  Acute on chronic stage III kidney disease:    5  ? OSA  Pt needs a sleep study   May be contrib to Pulmonary HTN    Signed, Lorine Bears, MD  12/02/2021, 1:20 PM  For questions or updates, please contact   Please consult [email or web address] for contact info under Cardiology/STEMI.

## 2021-12-03 ENCOUNTER — Ambulatory Visit: Payer: Medicare Other | Admitting: Family

## 2021-12-03 DIAGNOSIS — I272 Pulmonary hypertension, unspecified: Secondary | ICD-10-CM | POA: Diagnosis not present

## 2021-12-03 DIAGNOSIS — I5033 Acute on chronic diastolic (congestive) heart failure: Secondary | ICD-10-CM | POA: Diagnosis not present

## 2021-12-03 LAB — CBC
HCT: 41.8 % (ref 36.0–46.0)
Hemoglobin: 12.6 g/dL (ref 12.0–15.0)
MCH: 26.1 pg (ref 26.0–34.0)
MCHC: 30.1 g/dL (ref 30.0–36.0)
MCV: 86.7 fL (ref 80.0–100.0)
Platelets: 229 10*3/uL (ref 150–400)
RBC: 4.82 MIL/uL (ref 3.87–5.11)
RDW: 15.3 % (ref 11.5–15.5)
WBC: 5.1 10*3/uL (ref 4.0–10.5)
nRBC: 0 % (ref 0.0–0.2)

## 2021-12-03 LAB — BASIC METABOLIC PANEL
Anion gap: 8 (ref 5–15)
BUN: 32 mg/dL — ABNORMAL HIGH (ref 6–20)
CO2: 36 mmol/L — ABNORMAL HIGH (ref 22–32)
Calcium: 8.9 mg/dL (ref 8.9–10.3)
Chloride: 94 mmol/L — ABNORMAL LOW (ref 98–111)
Creatinine, Ser: 1.69 mg/dL — ABNORMAL HIGH (ref 0.44–1.00)
GFR, Estimated: 36 mL/min — ABNORMAL LOW (ref >60)
Glucose, Bld: 89 mg/dL (ref 70–99)
Potassium: 3.4 mmol/L — ABNORMAL LOW (ref 3.5–5.1)
Sodium: 138 mmol/L (ref 135–145)

## 2021-12-03 MED ORDER — FUROSEMIDE 10 MG/ML IJ SOLN
80.0000 mg | Freq: Once | INTRAMUSCULAR | Status: AC
  Administered 2021-12-03: 80 mg via INTRAVENOUS
  Filled 2021-12-03: qty 8

## 2021-12-03 MED ORDER — POTASSIUM CHLORIDE CRYS ER 20 MEQ PO TBCR
40.0000 meq | EXTENDED_RELEASE_TABLET | Freq: Once | ORAL | Status: AC
  Administered 2021-12-03: 40 meq via ORAL
  Filled 2021-12-03: qty 2

## 2021-12-03 NOTE — Progress Notes (Signed)
PROGRESS NOTE    Sheila Hays  [id number] DOB: 02-Nov-1970  DOA: 11/20/2021 Date of Service: 12/03/21 PCP: [place] Narrative / Hospital Course:  Sheila Hays is a 51 year old female with morbid obesity, hypertension, hyperlipidemia, chronic HFpEF, CKD 3B, who presented to the ED on 11/20/2021 for evaluation of shortness of breath and chest pain. Initial blood pressure 202/90 in the ED with otherwise normal vitals, SPO2 of 95% on room air.  Labs were mostly unremarkable but BNP elevated 580.8.  Chest x-ray with cardiomegaly, prominent central pulmonary vessels concerning for CHF and sepsis small/minimal bilateral pleural effusions. Blood pressure was treated with Vasotec 0.625 mg IV in the ED, also given furosemide 40 mg IV. 11/20/21: Admitted to the hospital for further evaluation management of acute on chronic HFpEF requiring IV diuresis, close monitoring and management of hypertensive urgency. 06/29-07/04: Underwent RHC 11/25/2021, severe pulmonary HTN, requiring lasix gtt. HTN improved. Trial BiPap for hypercapnic acidosis likely d/t OHS, plan for outpatient sleep study and likley needs home NIV 07/05: cardiology reduced lasix gtt from 6 to 4 mg/h, plan transition to po torsemide when euvolemic / if Cr worsens. Fall in bathroom, no head trauma, no concerns on serial neuro checks.  07/06: Cr bumped up today to 1.9, she did have decent UOP yesterday but only -319 daily net. Net IO Since Admission: -3,707.98 mL [11/28/21 0855] but po intake seems inaccurately measured over 07/03-07/04 so question true net output. GIven renal fxn, I stopped lasix gtt and ordered po torsemide. Await further recommendations from cardiology. Given fall yesterday, will add CK to AM labs though doubt rhabdo would be contributing to renal fxn as diuresis effect seems much more likely.  07/07: moved to ICU per cardiology recs, placed on milrinone drip for a few days with plan to start  low-dose Lasix drip, placed PICC line and monitor CVP. BP low, MAP low, started norepi  07/08: C/o chest pain, same as her usual sternal pain. Cardiology recs: continue milrinone. D/c sildenafil and carvedilol d/t hypotension.  Await Co. oximetry panel and CVP measurements (understanding that a normal CVP is not the goal in the setting of right heart failure).  Consider resuming diuretic therapy tomorrow. Sill on milrinone and norepi this morning, BP improved. AM BMP showed improving Cr. PICC occluded, failed exchange 07/09: Cr stable from yesterday. BP low but holding on norepi. No PICC/Central access planned as of now. Cardiology following. For now remains on milrinone and norepinephrine. Of note, I discussed w/ patient inability to measure CVP may be an issue but for now there is not an urgent need for central acces. Received 1 dose IV Lasix. 07/10: Weaned off norepi.  BP stable.  Remains off carvedilol, sildenafil. 07/11: remains on milrinone, got furosemide 80 IV x1 today, may consider wean off milrinone soon if renal fxn tolerates vs transfer to [facility] for evaluation by advanced heart failure team. Would avoid sildenafil.         Consultants:  Cardiology   Procedures: 11/25/2021 R heart catheterization     Subjective: Pt denied CP/SOB, I answered questions re: renal function.      ASSESSMENT & PLAN:   Principal Problem:   Acute on chronic heart failure with preserved ejection fraction (HFpEF) (HCC) Active Problems:   Hypertensive emergency   Essential hypertension   Chronic kidney disease, stage 3a (HCC)   Complex partial seizure disorder ([place])   Morbid obesity ([place])   Chest pain   Dizziness  Acute respiratory failure with hypoxia (HCC)   Hypercapnic acidosis   Pulmonary hypertension (HCC)   Acute on chronic heart failure with preserved ejection fraction (HFpEF) ([place]), POA Echo on showed EF 55 to [id number], diastolic function could not be evaluated, mild LVH, left atrium mild  to moderately dilated. 07/07 moved to ICU per cardiology recs, placed on milrinone drip, placed PICC line and monitor CVP. Hypotensive, needed norepi --> off this lat 07/09  07/08-07/11 continuing milrinone. 07/11 Lasix IV x1, monitor renal fxn   D/c sildenafil and carvedilol d/t hypotension.  Unable to monitor CVP as cannot obtain access. will need sleep study as outpatient.  BP improved    AKI on Chronic kidney disease, stage 3a ([place]) Renal function near baseline on admission up trended with IV diuresis.  Trend BMP since was given lasix x1 IV today Unable to give fluids d/t cardiac compromise May need to involve nephrology  Lab Results  Component Value Date   CREATININE 1.69 (H) 12/03/2021   CREATININE 2.00 (H) 12/02/2021   CREATININE 1.82 (H) 12/01/2021   Pulmonary hypertension ([place]), severe Likely related to untreated OHS/OSA, morbid obesity.   Right Heart Cath on 7/3 --showed evidence of severely elevated right and left-sided filling pressures, severe pulmonary hypertension and normal cardiac output. Initially was on Lasix drip which was d/c d/t AKI ICU on milrinone drip, renal fxn improving  Sildenafil added but stopped d/t hypotension     Hypertensive emergency - resolved Initial BP 202/90, resulting in pulmonary edema and decompensated CHF. 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  A.m. VBG on 7/2 with pH of 7.29, PCO2 of 85, HCO3 40.9 and PO2 of 70. Outpatient sleep study overnight BiPAP Patient likely needs home NIV   Acute respiratory failure with hypoxia ([place]) - resolved Patient presented with hypertensive heart failure and dyspnea.  Despite diuresis she continues to require oxygen.  Qualifies for home oxygen at 2 L/min.  Suspect due to underlying OHS, pulmonary HTN.   Home O2 ordered Supplemental O2, maintain sats >90% Sleep study recommended outpatient   Dizziness - resolved 7/1 - with BP  control improved, patient having significant dizziness when up to ambulate.    Chest pain - recurrent  Atypical, has been reproducible on palpation, likely musculoskeletal etiology vs esophageal etiology.  Troponin very minimally elevated with flat trend and EKG is nonacute. Most likely demand ischemia. Not consistent with ACS. 7/1 - recurrent bout in AM, trop down to 18 from 28 and EKG remains normal. 7/8 - recurrent again, cardiology aware  Cardiology following Nitro paste discontinued   Morbid obesity ([place]) Body mass index is 46.9 kg/m. Complicates overall care and prognosis.  Recommend lifestyle modifications including physical activity and diet for weight loss and overall long-term health.   Highly suspect OSA and/or OHS. Recommend sleep study as outpatient. Qualifies for home O2 here.   Complex partial seizure disorder (HCC) Resumed oxcarbamazepine 600 mg p.o. twice daily; Keppra 500 mg p.o. twice daily Resumed on Lamictal at 25 mg p.o. twice daily because lamictal requires gradual increase dosing Follow-up with primary care and/or neurology for further recommendations regarding Lamictal dosing Ativan 2 mg IV as needed for seizure, 2 doses ordered   Essential hypertension Presented with hypertensive emergency with pulmonary edema and CHF decompensation.  BP now controlled but with intermittent hypotension.        DVT prophylaxis: Lovenox Code Status: FULL Family Communication: pt declined call to  family at this time  Disposition Plan / TOC needs: ICU for now, may need transfer to [facility] if not improving / if renal funciton worsening  Barriers to discharge / significant pending items: cardiology recs appreciated re: goals and home rx going forward. Currently care has been escalated and patient is in ICU on milrinone,             Objective: Vitals:   12/03/21 1300 12/03/21 1400 12/03/21 1500 12/03/21 1600  BP: (!) 146/79 115/63 124/62 126/71  Pulse: 81 74 72 71   Resp: 19 17 16 17   Temp:   97.9 F (36.6 C)   TempSrc:   Oral   SpO2: 100% 98% 97% 96%  Weight:      Height:        Intake/Output Summary (Last 24 hours) at 12/03/2021 1734 Last data filed at 12/03/2021 1600 Gross per 24 hour  Intake 924.55 ml  Output 750 ml  Net 174.55 ml    Filed Weights   11/25/21 0300 11/26/21 0349 11/29/21 0423  Weight: 131.7 kg 128.2 kg 120.2 kg    Examination:  Constitutional:  VS as above General Appearance: alert, well-developed, well-nourished, NAD Respiratory: Normal respiratory effort Cardiovascular: S1S2, RRR Musculoskeletal:  No clubbing/cyanosis of digits Neurological: No cranial nerve deficit on limited exam Psychiatric: Normal judgment/insight Flat mood and affect       Scheduled Medications:   vitamin C  500 mg Oral Daily   aspirin EC  81 mg Oral Daily   atorvastatin  40 mg Oral q1800   Chlorhexidine Gluconate Cloth  6 each Topical Daily   enoxaparin (LOVENOX) injection  0.5 mg/kg Subcutaneous Q24H   escitalopram  20 mg Oral q morning   folic acid  500 mcg Oral Daily   lamoTRIgine  25 mg Oral BID   levETIRAcetam  500 mg Oral BID   mometasone-formoterol  2 puff Inhalation BID   oxcarbazepine  600 mg Oral BID   pantoprazole  40 mg Oral Daily   sodium chloride flush  10-40 mL Intracatheter Q12H   sodium chloride flush  3 mL Intravenous Q12H   sodium chloride flush  3 mL Intravenous Q12H    Continuous Infusions:  sodium chloride     sodium chloride Stopped (11/30/21 0128)   milrinone 0.25 mcg/kg/min (12/03/21 1600)    PRN Medications:  sodium chloride, albuterol, butalbital-acetaminophen-caffeine, HYDROcodone-acetaminophen, linaclotide, LORazepam, ondansetron **OR** ondansetron (ZOFRAN) IV, mouth rinse, senna-docusate, sodium chloride flush, sodium chloride flush, traZODone  Antimicrobials:  Anti-infectives (From admission, onward)    None       Data Reviewed: I have personally reviewed following labs and  imaging studies  CBC: Recent Labs  Lab 11/28/21 0625 11/29/21 0511 12/01/21 0547 12/02/21 0830 12/03/21 0425  WBC 5.2 5.3 5.1 5.3 5.1  HGB 13.6 13.6 13.0 13.1 12.6  HCT 45.5 45.5 43.2 43.4 41.8  MCV 86.5 88.0 88.2 87.7 86.7  PLT 247 236 212 219 229    Basic Metabolic Panel: Recent Labs  Lab 11/27/21 0551 11/28/21 0625 11/29/21 0511 11/30/21 1002 12/01/21 0547 12/02/21 0830 12/03/21 0425  NA 144   < > 143 139 141 141 138  K 3.3*   < > 3.9 3.4* 4.0 3.8 3.4*  CL 91*   < > 98 94* 97* 96* 94*  CO2 39*   < > 37* 33* 36* 36* 36*  GLUCOSE 83   < > 88 129* 97 91 89  BUN 28*   < > 40* 38* 36* 34*  32*  CREATININE 1.66*   < > 2.26* 1.84* 1.82* 2.00* 1.69*  CALCIUM 9.7   < > 9.0 9.4 9.1 9.1 8.9  MG 2.0  --   --   --   --   --   --    < > = values in this interval not displayed.    GFR: Estimated Creatinine Clearance: 51.5 mL/min (A) (by C-G formula based on SCr of 1.69 mg/dL (H)). Liver Function Tests: No results for input(s): "AST", "ALT", "ALKPHOS", "BILITOT", "PROT", "ALBUMIN" in the last 168 hours. No results for input(s): "LIPASE", "AMYLASE" in the last 168 hours. No results for input(s): "AMMONIA" in the last 168 hours. Coagulation Profile: No results for input(s): "INR", "PROTIME" in the last 168 hours. Cardiac Enzymes: Recent Labs  Lab 11/28/21 0625  CKTOTAL 38    BNP (last 3 results) No results for input(s): "PROBNP" in the last 8760 hours. HbA1C: No results for input(s): "HGBA1C" in the last 72 hours. CBG: Recent Labs  Lab 11/29/21 1451  GLUCAP 90    Lipid Profile: No results for input(s): "CHOL", "HDL", "LDLCALC", "TRIG", "CHOLHDL", "LDLDIRECT" in the last 72 hours.  Thyroid Function Tests: No results for input(s): "TSH", "T4TOTAL", "FREET4", "T3FREE", "THYROIDAB" in the last 72 hours. Anemia Panel: No results for input(s): "VITAMINB12", "FOLATE", "FERRITIN", "TIBC", "IRON", "RETICCTPCT" in the last 72 hours. Urine analysis:    Component Value  Date/Time   COLORURINE YELLOW (A) 06/14/2021 1830   APPEARANCEUR CLEAR (A) 06/14/2021 1830   LABSPEC 1.011 06/14/2021 1830   PHURINE 5.0 06/14/2021 1830   GLUCOSEU NEGATIVE 06/14/2021 1830   HGBUR NEGATIVE 06/14/2021 1830   BILIRUBINUR NEGATIVE 06/14/2021 1830   KETONESUR NEGATIVE 06/14/2021 1830   PROTEINUR 100 (A) 06/14/2021 1830   NITRITE NEGATIVE 06/14/2021 1830   LEUKOCYTESUR LARGE (A) 06/14/2021 1830   Sepsis Labs: @LABRCNTIP (procalcitonin:4,lacticidven:4)  No results found for this or any previous visit (from the past 240 hour(s)).       Radiology Studies last 96 hours: Venous Img Lower Bilateral (DVT)  Result Date: 12/02/2021 CLINICAL DATA:  Bilateral lower extremity pain.  Evaluate for DVT. EXAM: BILATERAL LOWER EXTREMITY VENOUS DOPPLER ULTRASOUND TECHNIQUE: Gray-scale sonography with graded compression, as well as color Doppler and duplex ultrasound were performed to evaluate the lower extremity deep venous systems from the level of the common femoral vein and including the common femoral, femoral, profunda femoral, popliteal and calf veins including the posterior tibial, peroneal and gastrocnemius veins when visible. The superficial great saphenous vein was also interrogated. Spectral Doppler was utilized to evaluate flow at rest and with distal augmentation maneuvers in the common femoral, femoral and popliteal veins. COMPARISON:  Left lower extremity venous Doppler ultrasound-09/22/2020 (negative). FINDINGS: Examination is degraded due to patient body habitus and poor sonographic window. RIGHT LOWER EXTREMITY Common Femoral Vein: No evidence of thrombus. Normal compressibility, respiratory phasicity and response to augmentation. Saphenofemoral Junction: No evidence of thrombus. Normal compressibility and flow on color Doppler imaging. Profunda Femoral Vein: No evidence of thrombus. Normal compressibility and flow on color Doppler imaging. Femoral Vein: No evidence of  thrombus. Normal compressibility, respiratory phasicity and response to augmentation. Popliteal Vein: No evidence of thrombus. Normal compressibility, respiratory phasicity and response to augmentation. Calf Veins: No evidence of thrombus. Normal compressibility and flow on color Doppler imaging. Superficial Great Saphenous Vein: No evidence of thrombus. Normal compressibility. Other Findings:  None. LEFT LOWER EXTREMITY Common Femoral Vein: No evidence of thrombus. Normal compressibility, respiratory phasicity and response to augmentation. Saphenofemoral  Junction: No evidence of thrombus. Normal compressibility and flow on color Doppler imaging. Profunda Femoral Vein: No evidence of thrombus. Normal compressibility and flow on color Doppler imaging. Femoral Vein: No evidence of thrombus. Normal compressibility, respiratory phasicity and response to augmentation. Popliteal Vein: No evidence of thrombus. Normal compressibility, respiratory phasicity and response to augmentation. Calf Veins: Appear patent where visualized. Superficial Great Saphenous Vein: No evidence of thrombus. Normal compressibility. Other Findings:  None. IMPRESSION: No evidence of DVT within either lower extremity. Electronically Signed   By: Sandi Mariscal M.D.   On: 12/02/2021 08:13   DG Chest Port 1 View  Result Date: 11/30/2021 CLINICAL DATA:  PICC placement EXAM: PORTABLE CHEST 1 VIEW COMPARISON:  11/20/2021 FINDINGS: Right upper extremity central venous catheter tip overlies the brachiocephalic region. Mild cardiomegaly. Low lung volume. No acute airspace disease. IMPRESSION: 1. Right upper extremity central venous catheter tip overlies the brachiocephalic region. 2. Cardiomegaly Electronically Signed   By: Donavan Foil M.D.   On: 11/30/2021 00:30            LOS: 12 days    Emeterio Reeve, DO [facility] 12/03/2021, 5:34 PM   Staff may message me via secure chat in [place]  but this may not receive immediate  response,  please page for urgent matters!  If 7PM-7AM, please contact night-coverage [email or web address]  Dictation software was used to generate the above note. Typos may occur and escape review, as with typed/written notes. Please contact Dr Sheppard Coil directly for clarity if needed.

## 2021-12-03 NOTE — Progress Notes (Signed)
Progress Note  Patient Name: Sheila Hays Date of Encounter: 12/03/2021  [facility] Cardiologist: Peter [place], MD   Subjective   Pt reports overall improvement with breathing. Her PICC line stopped working, removed yesterday.  Less UOP yesterday as diuretics held, continued on milrinone.  Inpatient Medications    Scheduled Meds:  vitamin C  500 mg Oral Daily   aspirin EC  81 mg Oral Daily   atorvastatin  40 mg Oral q1800   Chlorhexidine Gluconate Cloth  6 each Topical Daily   enoxaparin (LOVENOX) injection  0.5 mg/kg Subcutaneous Q24H   escitalopram  20 mg Oral q morning   folic acid  500 mcg Oral Daily   furosemide  80 mg Intravenous Once   lamoTRIgine  25 mg Oral BID   levETIRAcetam  500 mg Oral BID   mometasone-formoterol  2 puff Inhalation BID   oxcarbazepine  600 mg Oral BID   pantoprazole  40 mg Oral Daily   potassium chloride  40 mEq Oral Once   sodium chloride flush  10-40 mL Intracatheter Q12H   sodium chloride flush  3 mL Intravenous Q12H   sodium chloride flush  3 mL Intravenous Q12H   Continuous Infusions:  sodium chloride     sodium chloride Stopped (11/30/21 0128)   milrinone 0.25 mcg/kg/min (12/03/21 0728)   PRN Meds: sodium chloride, albuterol, butalbital-acetaminophen-caffeine, HYDROcodone-acetaminophen, linaclotide, LORazepam, ondansetron **OR** ondansetron (ZOFRAN) IV, mouth rinse, senna-docusate, sodium chloride flush, sodium chloride flush, traZODone   Vital Signs    Vitals:   12/02/21 1700 12/02/21 1800 12/02/21 2000 12/03/21 0728  BP: 128/78 (!) 145/84 115/69 (!) 152/70  Pulse: 69 70 75 69  Resp: 15 15 17 15   Temp:   98.3 F (36.8 C) 98.7 F (37.1 C)  TempSrc:   Oral Oral  SpO2: 95% 100% 99% 96%  Weight:      Height:        Intake/Output Summary (Last 24 hours) at 12/03/2021 0948 Last data filed at 12/03/2021 0700 Gross per 24 hour  Intake 915.55 ml  Output 200 ml  Net 715.55 ml      11/29/2021    4:23 AM 11/26/2021     3:49 AM 11/25/2021    3:00 AM  Last 3 Weights  Weight (lbs) 265 lb 1.6 oz 282 lb 10.1 oz 290 lb 5.5 oz  Weight (kg) 120.249 kg 128.2 kg 131.7 kg      Telemetry    NSR, rate in 70s - Personally Reviewed  ECG    No new readings - Personally Reviewed  Physical Exam   GEN: Obese female in no acute distress.   Neck: Difficult to assess JVD d/t body habitus Cardiac: RRR, no murmurs, rubs, or gallops.  Respiratory: Clear to auscultation bilaterally. GI: Soft, nontender, non-distended  MS: Trace lower extremity edema; No deformity. Neuro:  Nonfocal  Psych: Normal affect   Labs    High Sensitivity Troponin:   Recent Labs  Lab 11/20/21 1352 11/20/21 1634 11/21/21 1019 11/23/21 0942  TROPONINIHS 23* 21* 28* 18*     Chemistry Recent Labs  Lab 11/27/21 0551 11/28/21 0625 12/01/21 0547 12/02/21 0830 12/03/21 0425  NA 144   < > 141 141 138  K 3.3*   < > 4.0 3.8 3.4*  CL 91*   < > 97* 96* 94*  CO2 39*   < > 36* 36* 36*  GLUCOSE 83   < > 97 91 89  BUN 28*   < >  36* 34* 32*  CREATININE 1.66*   < > 1.82* 2.00* 1.69*  CALCIUM 9.7   < > 9.1 9.1 8.9  MG 2.0  --   --   --   --   GFRNONAA 37*   < > 33* 30* 36*  ANIONGAP 14   < > 8 9 8    < > = values in this interval not displayed.    Lipids  Recent Labs  Lab 11/28/21 1324  CHOL 117  TRIG 82  HDL 50  LDLCALC 51  CHOLHDL 2.3    Hematology Recent Labs  Lab 12/01/21 0547 12/02/21 0830 12/03/21 0425  WBC 5.1 5.3 5.1  RBC 4.90 4.95 4.82  HGB 13.0 13.1 12.6  HCT 43.2 43.4 41.8  MCV 88.2 87.7 86.7  MCH 26.5 26.5 26.1  MCHC 30.1 30.2 30.1  RDW 15.7* 15.6* 15.3  PLT 212 219 229   Thyroid No results for input(s): "TSH", "FREET4" in the last 168 hours.  BNPNo results for input(s): "BNP", "PROBNP" in the last 168 hours.  DDimer No results for input(s): "DDIMER" in the last 168 hours.   Radiology    No results found.  Cardiac Studies   2D echo 11/21/2021:  1. Left ventricular ejection fraction, by estimation, is  55 to 60%. The  left ventricle has normal function. The left ventricle has no regional  wall motion abnormalities. The left ventricular internal cavity size was  mildly dilated. There is mild left  ventricular hypertrophy. Left ventricular diastolic function could not be  evaluated.   2. Right ventricular systolic function is normal. The right ventricular  size is not well visualized.   3. Left atrial size was mild to moderately dilated.   4. The mitral valve is normal in structure. No evidence of mitral valve  regurgitation.   5. The aortic valve was not well visualized. Aortic valve regurgitation  is not visualized.   6. The inferior vena cava is normal in size with <50% respiratory  variability, suggesting right atrial pressure of 8 mmHg  Right Heart Cath 11/25/21:  RA: 22 mmHg RV: 92/18 with an end-diastolic pressure of 32 mmHg. PA: 95/44 with a mean of 64 mmHg PCW: 30 mmHg  Cardiac output: 7.4 with an index of 3.16. Pulmonary vascular resistance: 4.5 [place] units  Patient Profile     51 y.o. female with history of HFpEF, morbid obesity, CKD III, and HTN who was admitted 6/28 with dyspnea, chest pain, and volume overload.   Assessment & Plan      HFpEF with severe pulmonary hypertension: -RHC 7/3 showed severely elevated right and left-sided filling pressures (PA 95/44, PCWP 30) severe pulmonary hypertension and normal cardiac output -Lasix drip discontinued on 7/6 d/t rising creatinine, switched to oral torsemide, She received 1 dose and it was stopped when Cr went up to 2.26  - Received one dose of 80 mg IV lasix on 7/9 with almost 1 L out - Volume up 771 ml yesterday - Monitor creatinine, down from 2 to 1.69 today - Continue milrinone - Added one dose of IV lasix 80 mg today  - Potassium 3.4 today, Replace with goal of 4 - Both carvedilol and sildenafil were discontinued due to hypotension over the weekend.  Blood pressure is now stable.     Hypertension/  hypotension: - HTN meds stopped over the weekend d/t hypotension w/ milrinone - Weaned off norepinephrine - BP stable today  Acute on Chronic Kidney Disease stage III: -Monitoring closely -Avoid nephrotoxic  agents   Demand Ischemia/Chest wall pain: - in setting of CHF, hsTrop mildly elevated @ 23  21  28  18    - Likely demand ischemia in the setting of volume overload   - Denies chest pain today - Continue asa, nitrate, statin     For questions or updates, please contact [facility] Please consult [email or web address] for contact info under        Signed, Chalonda Schlatter, NP  12/03/2021, 9:48 AM

## 2021-12-04 DIAGNOSIS — I5033 Acute on chronic diastolic (congestive) heart failure: Secondary | ICD-10-CM | POA: Diagnosis not present

## 2021-12-04 DIAGNOSIS — N1831 Chronic kidney disease, stage 3a: Secondary | ICD-10-CM | POA: Diagnosis not present

## 2021-12-04 LAB — BASIC METABOLIC PANEL
Anion gap: 8 (ref 5–15)
BUN: 29 mg/dL — ABNORMAL HIGH (ref 6–20)
CO2: 34 mmol/L — ABNORMAL HIGH (ref 22–32)
Calcium: 9.1 mg/dL (ref 8.9–10.3)
Chloride: 98 mmol/L (ref 98–111)
Creatinine, Ser: 1.65 mg/dL — ABNORMAL HIGH (ref 0.44–1.00)
GFR, Estimated: 37 mL/min — ABNORMAL LOW (ref >60)
Glucose, Bld: 92 mg/dL (ref 70–99)
Potassium: 3.6 mmol/L (ref 3.5–5.1)
Sodium: 140 mmol/L (ref 135–145)

## 2021-12-04 MED ORDER — CALCIUM CARBONATE ANTACID 500 MG PO CHEW: 1.0000 | CHEWABLE_TABLET | Freq: Three times a day (TID) | ORAL | Status: DC | PRN

## 2021-12-04 MED ORDER — TORSEMIDE 20 MG PO TABS
40.0000 mg | ORAL_TABLET | Freq: Two times a day (BID) | ORAL | Status: DC
  Administered 2021-12-04 – 2021-12-05 (×4): 40 mg via ORAL
  Filled 2021-12-04 (×4): qty 2

## 2021-12-04 NOTE — Progress Notes (Signed)
Progress Note  Patient Name: Sheila Hays Date of Encounter: 12/04/2021  [facility] Cardiologist: Peter Swaziland, MD   Subjective   Shortness of breath better, denies chest pain or dizziness.  No acute events overnight  Inpatient Medications    Scheduled Meds:  vitamin C  500 mg Oral Daily   aspirin EC  81 mg Oral Daily   atorvastatin  40 mg Oral q1800   Chlorhexidine Gluconate Cloth  6 each Topical Daily   enoxaparin (LOVENOX) injection  0.5 mg/kg Subcutaneous Q24H   escitalopram  20 mg Oral q morning   folic acid  500 mcg Oral Daily   lamoTRIgine  25 mg Oral BID   levETIRAcetam  500 mg Oral BID   mometasone-formoterol  2 puff Inhalation BID   oxcarbazepine  600 mg Oral BID   pantoprazole  40 mg Oral Daily   sodium chloride flush  3 mL Intravenous Q12H   sodium chloride flush  3 mL Intravenous Q12H   torsemide  40 mg Oral BID   Continuous Infusions:  sodium chloride     sodium chloride Stopped (11/30/21 0128)   PRN Meds: sodium chloride, albuterol, butalbital-acetaminophen-caffeine, HYDROcodone-acetaminophen, linaclotide, LORazepam, ondansetron **OR** ondansetron (ZOFRAN) IV, mouth rinse, senna-docusate, sodium chloride flush, traZODone   Vital Signs    Vitals:   12/04/21 0900 12/04/21 1000 12/04/21 1100 12/04/21 1253  BP: 123/64 124/87 135/73 135/62  Pulse: 70 76 76 76  Resp: 16 19 19 16   Temp:      TempSrc:      SpO2: 94% 94% 100% 100%  Weight:      Height:        Intake/Output Summary (Last 24 hours) at 12/04/2021 1401 Last data filed at 12/04/2021 1200 Gross per 24 hour  Intake 1266.11 ml  Output 200 ml  Net 1066.11 ml      11/29/2021    4:23 AM 11/26/2021    3:49 AM 11/25/2021    3:00 AM  Last 3 Weights  Weight (lbs) 265 lb 1.6 oz 282 lb 10.1 oz 290 lb 5.5 oz  Weight (kg) 120.249 kg 128.2 kg 131.7 kg      Telemetry    Sinus rhythm, heart rate 78- Personally Reviewed  ECG     - Personally Reviewed  Physical Exam   GEN: No acute  distress.   Neck: No JVD Cardiac: RRR, no murmurs, rubs, or gallops.  Respiratory: Diminished breath sounds, no wheezing GI: Soft, nontender, non-distended  MS: No edema; No deformity. Neuro:  Nonfocal  Psych: Normal affect   Labs    High Sensitivity Troponin:   Recent Labs  Lab 11/20/21 1352 11/20/21 1634 11/21/21 1019 11/23/21 0942  TROPONINIHS 23* 21* 28* 18*     Chemistry Recent Labs  Lab 12/02/21 0830 12/03/21 0425 12/04/21 0504  NA 141 138 140  K 3.8 3.4* 3.6  CL 96* 94* 98  CO2 36* 36* 34*  GLUCOSE 91 89 92  BUN 34* 32* 29*  CREATININE 2.00* 1.69* 1.65*  CALCIUM 9.1 8.9 9.1  GFRNONAA 30* 36* 37*  ANIONGAP 9 8 8     Lipids  Recent Labs  Lab 11/28/21 1324  CHOL 117  TRIG 82  HDL 50  LDLCALC 51  CHOLHDL 2.3    Hematology Recent Labs  Lab 12/01/21 0547 12/02/21 0830 12/03/21 0425  WBC 5.1 5.3 5.1  RBC 4.90 4.95 4.82  HGB 13.0 13.1 12.6  HCT 43.2 43.4 41.8  MCV 88.2 87.7 86.7  MCH 26.5  26.5 26.1  MCHC 30.1 30.2 30.1  RDW 15.7* 15.6* 15.3  PLT 212 219 229   Thyroid No results for input(s): "TSH", "FREET4" in the last 168 hours.  BNPNo results for input(s): "BNP", "PROBNP" in the last 168 hours.  DDimer No results for input(s): "DDIMER" in the last 168 hours.   Radiology    No results found.  Cardiac Studies   TTE 11/21/2021 1. Left ventricular ejection fraction, by estimation, is 55 to 60%. The  left ventricle has normal function. The left ventricle has no regional  wall motion abnormalities. The left ventricular internal cavity size was  mildly dilated. There is mild left  ventricular hypertrophy. Left ventricular diastolic function could not be  evaluated.   2. Right ventricular systolic function is normal. The right ventricular  size is not well visualized.   3. Left atrial size was mild to moderately dilated.   4. The mitral valve is normal in structure. No evidence of mitral valve  regurgitation.   5. The aortic valve was not  well visualized. Aortic valve regurgitation  is not visualized.   6. The inferior vena cava is normal in size with <50% respiratory  variability, suggesting right atrial pressure of 8 mmHg.   Patient Profile     51 y.o. female with history of HFpEF, morbid obesity, CKD 3, hypertension presenting with shortness of breath, chest pain, leg edema.  Being seen for HFpEF exacerbation, volume overload.  Assessment & Plan    1.  HFpEF, -Appears euvolemic -Net -2.1 L over the past 24 hours -Creatinine of 1.65, around baseline over the past year. -Stop milrinone, start p.o. torsemide 40 mg twice daily  2.  Hypertension -BP controlled hypertensive patient -Continue torsemide   3.  Snoring, morbid obesity -Patient likely has OSA -Recommend sleep study as outpatient   4.  CKD 3 -Appears stable -Avoid nephrotoxic's  A lot of patient's symptoms is primarily driven by morbid obesity.  Weight loss will be extremely beneficial for patient's overall health.  This was discussed in detail with patient.  Greater than 50% was spent in counseling and coordination of care with patient Total encounter time 50 minutes or more    Signed, Debbe Odea, MD  12/04/2021, 2:01 PM

## 2021-12-04 NOTE — Progress Notes (Signed)
PROGRESS NOTE    Sheila Hays  [id number] DOB: January 01, 1971  DOA: 11/20/2021 Date of Service: 12/04/21 PCP: Nice Narrative / Hospital Course:  Sheila Hays is a 51 year old female with morbid obesity, hypertension, hyperlipidemia, chronic HFpEF, CKD 3B, who presented to the ED on 11/20/2021 for evaluation of shortness of breath and chest pain. Initial blood pressure 202/90 in the ED with otherwise normal vitals, SPO2 of 95% on room air.  Labs were mostly unremarkable but BNP elevated 580.8.  Chest x-ray with cardiomegaly, prominent central pulmonary vessels concerning for CHF and sepsis small/minimal bilateral pleural effusions. Blood pressure was treated with Vasotec 0.625 mg IV in the ED, also given furosemide 40 mg IV. 11/20/21: Admitted to the hospital for further evaluation management of acute on chronic HFpEF requiring IV diuresis, close monitoring and management of hypertensive urgency. 06/29-07/04: Underwent RHC 11/25/2021, severe pulmonary HTN, requiring lasix gtt. HTN improved. Trial BiPap for hypercapnic acidosis likely d/t OHS, plan for outpatient sleep study and likley needs home NIV 07/05: cardiology reduced lasix gtt from 6 to 4 mg/h, plan transition to po torsemide when euvolemic / if Cr worsens. Fall in bathroom, no head trauma, no concerns on serial neuro checks.  07/06: Cr bumped up today to 1.9, she did have decent UOP yesterday but only -319 daily net. Net IO Since Admission: -3,707.98 mL [11/28/21 0855] but po intake seems inaccurately measured over 07/03-07/04 so question true net output. GIven renal fxn, I stopped lasix gtt and ordered po torsemide. Await further recommendations from cardiology. Given fall yesterday, will add CK to AM labs though doubt rhabdo would be contributing to renal fxn as diuresis effect seems much more likely.  07/07: moved to ICU per cardiology recs, placed on milrinone drip for a few days with plan to start  low-dose Lasix drip, placed PICC line and monitor CVP. BP low, MAP low, started norepi  07/08: C/o chest pain, same as her usual sternal pain. Cardiology recs: continue milrinone. D/c sildenafil and carvedilol d/t hypotension.  Await Co. oximetry panel and CVP measurements (understanding that a normal CVP is not the goal in the setting of right heart failure).  Consider resuming diuretic therapy tomorrow. Sill on milrinone and norepi this morning, BP improved. AM BMP showed improving Cr. PICC occluded, failed exchange 07/09: Cr stable from yesterday. BP low but holding on norepi. No PICC/Central access planned as of now. Cardiology following. For now remains on milrinone and norepinephrine. Of note, I discussed w/ patient inability to measure CVP may be an issue but for now there is not an urgent need for central acces. Received 1 dose IV Lasix. 07/10: Weaned off norepi.  BP stable.  Remains off carvedilol, sildenafil. 07/11: remains on milrinone, got furosemide 80 IV x1 today, may consider wean off milrinone soon if renal fxn tolerates vs transfer to [facility] for evaluation by advanced heart failure team. Would avoid sildenafil.  07/12: Per cardio note Appears euvolemic, Net -2.1 L over the past 24 hours, Creatinine of 1.65, around baseline over the past year, Stop milrinone, start p.o. torsemide 40 mg twice daily        Consultants:  Cardiology   Procedures: 11/25/2021 R heart catheterization     Subjective: Pt denied CP/SOB, I answered questions re: renal function.      ASSESSMENT & PLAN:   Principal Problem:   Acute on chronic heart failure with preserved ejection fraction (HFpEF) (HCC) Active Problems:   Hypertensive emergency  Essential hypertension   Chronic kidney disease, stage 3a (HCC)   Complex partial seizure disorder (HCC)   Morbid obesity (HCC)   Chest pain   Dizziness   Acute respiratory failure with hypoxia (HCC)   Hypercapnic acidosis   Pulmonary hypertension  (HCC)   Acute on chronic heart failure with preserved ejection fraction (HFpEF) (HCC), POA Echo on showed EF 55 to 60%, diastolic function could not be evaluated, mild LVH, left atrium mild to moderately dilated. 07/07 moved to ICU per cardiology recs, placed on milrinone drip, placed PICC line and monitor CVP. Hypotensive, needed norepi --> off this lat 07/09  07/08-07/11 continuing milrinone. 07/11 Lasix IV x1, monitor renal fxn   D/c sildenafil and carvedilol d/t hypotension.  Unable to monitor CVP as cannot obtain access. will need sleep study as outpatient.  BP improved  07/12: per cardio Stop milrinone, start p.o. torsemide 40 mg twice daily   AKI on Chronic kidney disease, stage 3a (HCC) Renal function near baseline on admission up trended with IV diuresis, now back to baseline  Lab Results  Component Value Date   CREATININE 1.65 (H) 12/04/2021   CREATININE 1.69 (H) 12/03/2021   CREATININE 2.00 (H) 12/02/2021   Pulmonary hypertension (HCC), severe Likely related to untreated OHS/OSA, morbid obesity.   Right Heart Cath on 7/3 --showed evidence of severely elevated right and left-sided filling pressures, severe pulmonary hypertension and normal cardiac output. Initially was on Lasix drip which was d/c d/t AKI ICU on milrinone drip, renal fxn improving --> d/c'ed 07/12 Sildenafil added but stopped d/t hypotension     Hypertensive emergency - resolved Initial BP 202/90, resulting in pulmonary edema and decompensated CHF. 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  A.m. VBG on 7/2 with pH of 7.29, PCO2 of 85, HCO3 40.9 and PO2 of 70. Outpatient sleep study overnight BiPAP Patient likely needs home NIV   Acute respiratory failure with hypoxia (HCC) - resolved Patient presented with hypertensive heart failure and dyspnea.  Despite diuresis she continues to require oxygen.  Qualifies for home oxygen at 2 L/min.   Suspect due to underlying OHS, pulmonary HTN.   Home O2 ordered Supplemental O2, maintain sats >90% Sleep study recommended outpatient   Dizziness - resolved 7/1 - with BP control improved, patient having significant dizziness when up to ambulate.    Chest pain - recurrent  Atypical, has been reproducible on palpation, likely musculoskeletal etiology vs esophageal etiology.  Troponin very minimally elevated with flat trend and EKG is nonacute. Most likely demand ischemia. Not consistent with ACS. 7/1 - recurrent bout in AM, trop down to 18 from 28 and EKG remains normal. 7/8 - recurrent again, cardiology aware  Cardiology following Nitro paste discontinued   Morbid obesity (HCC) Body mass index is 46.9 kg/m. Complicates overall care and prognosis.  Recommend lifestyle modifications including physical activity and diet for weight loss and overall long-term health.   Highly suspect OSA and/or OHS. Recommend sleep study as outpatient. Qualifies for home O2 here.   Complex partial seizure disorder (HCC) Resumed oxcarbamazepine 600 mg p.o. twice daily; Keppra 500 mg p.o. twice daily Resumed on Lamictal at 25 mg p.o. twice daily because lamictal requires gradual increase dosing Follow-up with primary care and/or neurology for further recommendations regarding Lamictal dosing Ativan 2 mg IV as needed for seizure, 2 doses ordered   Essential hypertension Presented with hypertensive emergency with pulmonary edema and CHF decompensation.  BP now controlled but with intermittent hypotension.        DVT prophylaxis: Lovenox Code Status: FULL Family Communication: pt declined call to family at this time  Disposition Plan / TOC needs: ICU for now, may need transfer to [facility] if not improving / if renal funciton worsening but thankfully renal function stabilizing  Barriers to discharge / significant pending items: cardiology recs appreciated re: goals and home rx going forward. Currently care  has been escalated and patient is in ICU on milrinone but taken off this today, may be able to go back to PCU tomorrow              Objective: Vitals:   12/04/21 0900 12/04/21 1000 12/04/21 1100 12/04/21 1253  BP: 123/64 124/87 135/73 135/62  Pulse: 70 76 76 76  Resp: 16 19 19 16   Temp:      TempSrc:      SpO2: 94% 94% 100% 100%  Weight:      Height:        Intake/Output Summary (Last 24 hours) at 12/04/2021 1504 Last data filed at 12/04/2021 1400 Gross per 24 hour  Intake 1257.1 ml  Output 800 ml  Net 457.1 ml    Filed Weights   11/25/21 0300 11/26/21 0349 11/29/21 0423  Weight: 131.7 kg 128.2 kg 120.2 kg    Examination:  Constitutional:  VS as above General Appearance: alert, well-developed, well-nourished, NAD Respiratory: Normal respiratory effort Cardiovascular: S1S2, RRR Musculoskeletal:  No clubbing/cyanosis of digits Neurological: No cranial nerve deficit on limited exam Psychiatric: Normal judgment/insight Flat mood and affect       Scheduled Medications:   vitamin C  500 mg Oral Daily   aspirin EC  81 mg Oral Daily   atorvastatin  40 mg Oral q1800   Chlorhexidine Gluconate Cloth  6 each Topical Daily   enoxaparin (LOVENOX) injection  0.5 mg/kg Subcutaneous Q24H   escitalopram  20 mg Oral q morning   folic acid  500 mcg Oral Daily   lamoTRIgine  25 mg Oral BID   levETIRAcetam  500 mg Oral BID   mometasone-formoterol  2 puff Inhalation BID   oxcarbazepine  600 mg Oral BID   pantoprazole  40 mg Oral Daily   sodium chloride flush  3 mL Intravenous Q12H   sodium chloride flush  3 mL Intravenous Q12H   torsemide  40 mg Oral BID    Continuous Infusions:  sodium chloride     sodium chloride Stopped (11/30/21 0128)    PRN Medications:  sodium chloride, albuterol, butalbital-acetaminophen-caffeine, HYDROcodone-acetaminophen, linaclotide, LORazepam, ondansetron **OR** ondansetron (ZOFRAN) IV, mouth rinse, senna-docusate, sodium chloride  flush, traZODone  Antimicrobials:  Anti-infectives (From admission, onward)    None       Data Reviewed: I have personally reviewed following labs and imaging studies  CBC: Recent Labs  Lab 11/28/21 0625 11/29/21 0511 12/01/21 0547 12/02/21 0830 12/03/21 0425  WBC 5.2 5.3 5.1 5.3 5.1  HGB 13.6 13.6 13.0 13.1 12.6  HCT 45.5 45.5 43.2 43.4 41.8  MCV 86.5 88.0 88.2 87.7 86.7  PLT 247 236 212 219 229    Basic Metabolic Panel: Recent Labs  Lab 11/30/21 1002 12/01/21 0547 12/02/21 0830 12/03/21 0425 12/04/21 0504  NA 139 141 141 138 140  K 3.4* 4.0 3.8 3.4* 3.6  CL 94* 97* 96* 94* 98  CO2 33* 36* 36* 36* 34*  GLUCOSE 129* 97 91 89 92  BUN 38* 36* 34* 32* 29*  CREATININE 1.84*  1.82* 2.00* 1.69* 1.65*  CALCIUM 9.4 9.1 9.1 8.9 9.1    GFR: Estimated Creatinine Clearance: 52.7 mL/min (A) (by C-G formula based on SCr of 1.65 mg/dL (H)). Liver Function Tests: No results for input(s): "AST", "ALT", "ALKPHOS", "BILITOT", "PROT", "ALBUMIN" in the last 168 hours. No results for input(s): "LIPASE", "AMYLASE" in the last 168 hours. No results for input(s): "AMMONIA" in the last 168 hours. Coagulation Profile: No results for input(s): "INR", "PROTIME" in the last 168 hours. Cardiac Enzymes: Recent Labs  Lab 11/28/21 0625  CKTOTAL 38    BNP (last 3 results) No results for input(s): "PROBNP" in the last 8760 hours. HbA1C: No results for input(s): "HGBA1C" in the last 72 hours. CBG: Recent Labs  Lab 11/29/21 1451  GLUCAP 90    Lipid Profile: No results for input(s): "CHOL", "HDL", "LDLCALC", "TRIG", "CHOLHDL", "LDLDIRECT" in the last 72 hours.  Thyroid Function Tests: No results for input(s): "TSH", "T4TOTAL", "FREET4", "T3FREE", "THYROIDAB" in the last 72 hours. Anemia Panel: No results for input(s): "VITAMINB12", "FOLATE", "FERRITIN", "TIBC", "IRON", "RETICCTPCT" in the last 72 hours. Urine analysis:    Component Value Date/Time   COLORURINE YELLOW (A)  06/14/2021 1830   APPEARANCEUR CLEAR (A) 06/14/2021 1830   LABSPEC 1.011 06/14/2021 1830   PHURINE 5.0 06/14/2021 1830   GLUCOSEU NEGATIVE 06/14/2021 1830   HGBUR NEGATIVE 06/14/2021 1830   BILIRUBINUR NEGATIVE 06/14/2021 1830   [place] 06/14/2021 1830   PROTEINUR 100 (A) 06/14/2021 1830   NITRITE NEGATIVE 06/14/2021 1830   LEUKOCYTESUR LARGE (A) 06/14/2021 1830   Sepsis Labs: @LABRCNTIP (procalcitonin:4,lacticidven:4)  No results found for this or any previous visit (from the past 240 hour(s)).       Radiology Studies last 96 hours: No results found.          LOS: 13 days    Emeterio Reeve, DO [facility] 12/04/2021, 3:04 PM   Staff may message me via secure chat in [place]  but this may not receive immediate response,  please page for urgent matters!  If 7PM-7AM, please contact night-coverage [email or web address]  Dictation software was used to generate the above note. Typos may occur and escape review, as with typed/written notes. Please contact Dr Sheppard Coil directly for clarity if needed.

## 2021-12-05 DIAGNOSIS — I509 Heart failure, unspecified: Secondary | ICD-10-CM | POA: Diagnosis not present

## 2021-12-05 DIAGNOSIS — R42 Dizziness and giddiness: Secondary | ICD-10-CM

## 2021-12-05 DIAGNOSIS — N1831 Chronic kidney disease, stage 3a: Secondary | ICD-10-CM | POA: Diagnosis not present

## 2021-12-05 DIAGNOSIS — R0789 Other chest pain: Secondary | ICD-10-CM | POA: Diagnosis not present

## 2021-12-05 DIAGNOSIS — I5033 Acute on chronic diastolic (congestive) heart failure: Secondary | ICD-10-CM | POA: Diagnosis not present

## 2021-12-05 DIAGNOSIS — J9601 Acute respiratory failure with hypoxia: Secondary | ICD-10-CM | POA: Diagnosis not present

## 2021-12-05 LAB — BASIC METABOLIC PANEL
Anion gap: 10 (ref 5–15)
BUN: 32 mg/dL — ABNORMAL HIGH (ref 6–20)
CO2: 35 mmol/L — ABNORMAL HIGH (ref 22–32)
Calcium: 9 mg/dL (ref 8.9–10.3)
Chloride: 94 mmol/L — ABNORMAL LOW (ref 98–111)
Creatinine, Ser: 1.81 mg/dL — ABNORMAL HIGH (ref 0.44–1.00)
GFR, Estimated: 33 mL/min — ABNORMAL LOW (ref >60)
Glucose, Bld: 85 mg/dL (ref 70–99)
Potassium: 3.4 mmol/L — ABNORMAL LOW (ref 3.5–5.1)
Sodium: 139 mmol/L (ref 135–145)

## 2021-12-05 MED ORDER — CARVEDILOL 3.125 MG PO TABS
3.1250 mg | ORAL_TABLET | Freq: Two times a day (BID) | ORAL | Status: DC
  Administered 2021-12-05 – 2021-12-06 (×2): 3.125 mg via ORAL
  Filled 2021-12-05 (×3): qty 1

## 2021-12-05 MED ORDER — POTASSIUM CHLORIDE CRYS ER 20 MEQ PO TBCR
40.0000 meq | EXTENDED_RELEASE_TABLET | Freq: Once | ORAL | Status: AC
  Administered 2021-12-05: 40 meq via ORAL
  Filled 2021-12-05: qty 2

## 2021-12-05 NOTE — Progress Notes (Signed)
  Transition of Care [facility]) Screening Note   Patient Details  Name: Sheila Hays Date of Birth: [birth year]   Transition of Care [facility]) CM/SW Contact:    Gildardo Griffes, LCSW Phone Number: 12/05/2021, 9:34 AM    Transition of Care Department [facility]) has reviewed patient and no TOC needs have been identified at this time. We will continue to monitor patient advancement through interdisciplinary progression rounds. If new patient transition needs arise, please place a TOC consult.  [place], [place] [phone]

## 2021-12-05 NOTE — Evaluation (Signed)
Physical Therapy Evaluation Patient Details Name: Sheila Hays MRN: [id number] DOB: 27-Mar-1971 Today's Date: 12/05/2021  History of Present Illness  Pt is a 51 y.o. female presenting to hospital 11/20/21 c/o SOB and chest pain.  Pt admitted with acute exacerbation of CHF, complex partial seizure disorder, and NSTEMI.  S/p R heart catheterization 7/3.  Fall noted 7/5 in bathroom.  Moved to ICU 7/7 (now transferred to PCU).  PMH includes CHF, CKD, htn, seizures, NSTEMI, morbid obesity.  Clinical Impression  Prior to hospital admission, pt was independent with functional mobility; lives with her boyfriend in 1 level apt (ground floor; no steps to navigate).  Pt reporting 7/10 mid sternal chest pain (for past 30 minutes) upon PT arrival and 9/10 headache--nurse notified of pt's chest pain and came to give pt pain meds (nurse reports chest pain not cardiac related); nurse cleared pt for ambulation and present during mobility portion of sessions activities.  Currently pt is CGA with transfers and ambulation 15 feet with slow cautious gait and wide BOS (no AD use), and then after sitting rest break pt ambulated 75 feet with RW use (all with chair follow for safety).  No dizziness reported at rest or initially with ambulation but distance ambulated each trial limited d/t pt's c/o feeling dizzy and pt feeling like she would fall over (although improved balance and gait mechanics noted with RW use); dizziness resolved within 1-2 minutes of sitting rest break.  BP 157/95 after 1st 15 feet of ambulation; HR in 80's bpm during sessions activities.  Pt reporting improved mid sternal chest pain and headache (down to 5/10 for both) end of session at rest.  Pt would benefit from skilled PT to address noted impairments and functional limitations (see below for any additional details).  Upon hospital discharge, pt would benefit from HHPT.    Recommendations for follow up therapy are one component of a multi-disciplinary  discharge planning process, led by the attending physician.  Recommendations may be updated based on patient status, additional functional criteria and insurance authorization.  Follow Up Recommendations Home health PT      Assistance Recommended at Discharge Intermittent Supervision/Assistance  Patient can return home with the following  A little help with walking and/or transfers;A little help with bathing/dressing/bathroom;Assistance with cooking/housework;Assist for transportation;Help with stairs or ramp for entrance    Equipment Recommendations Rolling walker (2 wheels)  Recommendations for Other Services       Functional Status Assessment Patient has had a recent decline in their functional status and demonstrates the ability to make significant improvements in function in a reasonable and predictable amount of time.     Precautions / Restrictions Precautions Precautions: Fall Restrictions Weight Bearing Restrictions: No      Mobility  Bed Mobility               General bed mobility comments: Deferred (pt sitting edge of bed beginning/end of session)    Transfers Overall transfer level: Needs assistance Equipment used: None, Rolling walker (2 wheels) Transfers: Sit to/from Stand, Bed to chair/wheelchair/BSC Sit to Stand: Min guard (x1 trial from bed; x2 trials from transport chair)   Step pivot transfers: Min guard (transport chair to bed with UE support)       General transfer comment: mild increased effort to stand; overall steady    Ambulation/Gait Ambulation/Gait assistance: Min guard Gait Distance (Feet):  (15 feet; 75 feet) Assistive device: None, Rolling walker (2 wheels)   Gait velocity: decreased  General Gait Details: wide BOS with increased B lateral sway and increased time to take shorter steps ambulating without UE support (slower gait speed); mild increased BOS with partial to step through gait pattern ambulating with RW use (mild  decreased gait speed)  Stairs            Wheelchair Mobility    Modified Rankin (Stroke Patients Only)       Balance Overall balance assessment: Needs assistance Sitting-balance support: No upper extremity supported, Feet supported Sitting balance-Leahy Scale: Normal Sitting balance - Comments: steady sitting reaching outside BOS   Standing balance support: No upper extremity supported, During functional activity Standing balance-Leahy Scale: Fair Standing balance comment: pt with slow cautious gait ambulating without UE support (with wide BOS and increased B lateral sway)                             Pertinent Vitals/Pain Pain Assessment Pain Assessment: 0-10 Pain Score: 5  Pain Location: mid sternal chest and headache Pain Descriptors / Indicators: Pressure, Headache Pain Intervention(s): Limited activity within patient's tolerance, Monitored during session, Repositioned, RN gave pain meds during session    Home Living Family/patient expects to be discharged to:: Private residence Living Arrangements: Spouse/significant other (pt's boyfriend) Available Help at Discharge: Friend(s) Type of Home: Apartment (ground floor) Home Access: Level entry       Home Layout: One level Home Equipment: Grab bars - tub/shower      Prior Function Prior Level of Function : Independent/Modified Independent             Mobility Comments: H/o falls when she has a seizure       Hand Dominance        Extremity/Trunk Assessment   Upper Extremity Assessment Upper Extremity Assessment: Generalized weakness    Lower Extremity Assessment Lower Extremity Assessment: Generalized weakness    Cervical / Trunk Assessment Cervical / Trunk Assessment: Other exceptions Cervical / Trunk Exceptions: forward head/shoulders  Communication   Communication: No difficulties  Cognition Arousal/Alertness: Awake/alert Behavior During Therapy: WFL for tasks  assessed/performed Overall Cognitive Status: Within Functional Limits for tasks assessed                                          General Comments  Nursing cleared pt for participation in physical therapy.  Pt agreeable to PT session.    Exercises  Gait training with RW   Assessment/Plan    PT Assessment Patient needs continued PT services  PT Problem List Decreased strength;Decreased activity tolerance;Decreased balance;Decreased mobility;Decreased knowledge of use of DME;Decreased knowledge of precautions;Cardiopulmonary status limiting activity       PT Treatment Interventions DME instruction;Gait training;Functional mobility training;Therapeutic activities;Therapeutic exercise;Balance training;Patient/family education    PT Goals (Current goals can be found in the Care Plan section)  Acute Rehab PT Goals Patient Stated Goal: to go home PT Goal Formulation: With patient Time For Goal Achievement: 12/19/21 Potential to Achieve Goals: Good    Frequency Min 2X/week     Co-evaluation               AM-PAC PT "6 Clicks" Mobility  Outcome Measure Help needed turning from your back to your side while in a flat bed without using bedrails?: None Help needed moving from lying on your back to sitting on the side of a  flat bed without using bedrails?: None Help needed moving to and from a bed to a chair (including a wheelchair)?: A Little Help needed standing up from a chair using your arms (e.g., wheelchair or bedside chair)?: A Little Help needed to walk in hospital room?: A Little Help needed climbing 3-5 steps with a railing? : A Little 6 Click Score: 20    End of Session Equipment Utilized During Treatment: Gait belt Activity Tolerance: Other (comment) (limited distance ambulated d/t dizziness) Patient left: with call bell/phone within reach (sitting on edge of bed eating lunch) Nurse Communication: Mobility status;Precautions (nurse present for all  functional mobility) PT Visit Diagnosis: Unsteadiness on feet (R26.81);Muscle weakness (generalized) (M62.81);Other abnormalities of gait and mobility (R26.89);Dizziness and giddiness (R42)    Time: 5465-0354 PT Time Calculation (min) (ACUTE ONLY): 28 min   Charges:   PT Evaluation $PT Eval Low Complexity: 1 Low PT Treatments $Gait Training: 8-22 mins       Hendricks Limes, PT 12/05/21, 2:11 PM

## 2021-12-05 NOTE — Progress Notes (Signed)
Progress Note  Patient Name: Sheila Hays Date of Encounter: 12/05/2021  [facility] Cardiologist: Peter [place], MD   Subjective   Patient seen on AM rounds. Denies any chest pain or shortness of breath. Did have a fall last evening in the restroom. Patient states that she urinated several different times yesterday and overnight.   Inpatient Medications    Scheduled Meds:  vitamin C  500 mg Oral Daily   aspirin EC  81 mg Oral Daily   atorvastatin  40 mg Oral q1800   carvedilol  3.125 mg Oral BID WC   Chlorhexidine Gluconate Cloth  6 each Topical Daily   enoxaparin (LOVENOX) injection  0.5 mg/kg Subcutaneous Q24H   escitalopram  20 mg Oral q morning   folic acid  500 mcg Oral Daily   lamoTRIgine  25 mg Oral BID   levETIRAcetam  500 mg Oral BID   mometasone-formoterol  2 puff Inhalation BID   oxcarbazepine  600 mg Oral BID   pantoprazole  40 mg Oral Daily   potassium chloride  40 mEq Oral Once   sodium chloride flush  3 mL Intravenous Q12H   torsemide  40 mg Oral BID   Continuous Infusions:  PRN Meds: albuterol, butalbital-acetaminophen-caffeine, calcium carbonate, HYDROcodone-acetaminophen, linaclotide, LORazepam, ondansetron **OR** ondansetron (ZOFRAN) IV, mouth rinse, senna-docusate, traZODone   Vital Signs    Vitals:   12/04/21 1924 12/04/21 2308 12/05/21 0321 12/05/21 0738  BP: 116/62 (!) 131/54 (!) 123/52 (!) 128/50  Pulse: 75 71 70 71  Resp: 18 18 16 17   Temp: 99.4 F (37.4 C) 98.9 F (37.2 C) 98.7 F (37.1 C) 98.5 F (36.9 C)  TempSrc: Oral Oral Oral Oral  SpO2: 92% 90% 98% 94%  Weight:      Height:        Intake/Output Summary (Last 24 hours) at 12/05/2021 1006 Last data filed at 12/05/2021 1004 Gross per 24 hour  Intake 1045.52 ml  Output 925 ml  Net 120.52 ml      11/29/2021    4:23 AM 11/26/2021    3:49 AM 11/25/2021    3:00 AM  Last 3 Weights  Weight (lbs) 265 lb 1.6 oz 282 lb 10.1 oz 290 lb 5.5 oz  Weight (kg) 120.249 kg 128.2 kg  131.7 kg      Telemetry    Sinus rates 60-70- Personally Reviewed  ECG    No new tracings - Personally Reviewed  Physical Exam   GEN: No acute distress.   Neck: No JVD Cardiac: RRR, no murmurs, rubs, or gallops.  Respiratory: Diminished to auscultation bilaterally.Respirations are unlabored on room air at rest GI: Soft, nontender, obese,non-distended, bowel sounds present in all 4 quadrants MS: No edema; No deformity. Neuro:  Nonfocal  Psych: Normal affect    Labs    High Sensitivity Troponin:   Recent Labs  Lab 11/20/21 1352 11/20/21 1634 11/21/21 1019 11/23/21 0942  TROPONINIHS 23* 21* 28* 18*     Chemistry Recent Labs  Lab 12/03/21 0425 12/04/21 0504 12/05/21 0403  NA 138 140 139  K 3.4* 3.6 3.4*  CL 94* 98 94*  CO2 36* 34* 35*  GLUCOSE 89 92 85  BUN 32* 29* 32*  CREATININE 1.69* 1.65* 1.81*  CALCIUM 8.9 9.1 9.0  GFRNONAA 36* 37* 33*  ANIONGAP 8 8 10     Lipids  Recent Labs  Lab 11/28/21 1324  CHOL 117  TRIG 82  HDL 50  LDLCALC 51  CHOLHDL 2.3  Hematology Recent Labs  Lab 12/01/21 0547 12/02/21 0830 12/03/21 0425  WBC 5.1 5.3 5.1  RBC 4.90 4.95 4.82  HGB 13.0 13.1 12.6  HCT 43.2 43.4 41.8  MCV 88.2 87.7 86.7  MCH 26.5 26.5 26.1  MCHC 30.1 30.2 30.1  RDW 15.7* 15.6* 15.3  PLT 212 219 229   Thyroid No results for input(s): "TSH", "FREET4" in the last 168 hours.  BNPNo results for input(s): "BNP", "PROBNP" in the last 168 hours.  DDimer No results for input(s): "DDIMER" in the last 168 hours.   Radiology    No results found.  Cardiac Studies   Right Herat Catheterization 11/25/2021 Successful right heart catheterization via the right antecubital vein. This showed evidence of severely elevated right and left-sided filling pressures, severe pulmonary hypertension and normal cardiac output.   RA: 22 mmHg RV: 92/18 with an end-diastolic pressure of 32 mmHg. PA: 95/44 with a mean of 64 mmHg PCW: 30 mmHg  Cardiac output: 7.4 with  an index of 3.16. Pulmonary vascular resistance: 4.5 Woods units   Recommendations: The patient is significantly volume overloaded. Pulmonary hypertension is severe and seems to be of a mixed venous and arterial etiology. We will start the patient on furosemide drip and the dose should be uptitrated based on urine output response.  Diuresis might be difficult due to the presence of severe pulmonary hypertension and likely RV dysfunction.  If renal function worsens with diuresis, she might require inotropic therapy for RV support and consideration to transfer to an advanced heart failure service.   Echocardiogram completed 11/21/2021  1. Left ventricular ejection fraction, by estimation, is 55 to 60%. The  left ventricle has normal function. The left ventricle has no regional  wall motion abnormalities. The left ventricular internal cavity size was  mildly dilated. There is mild left  ventricular hypertrophy. Left ventricular diastolic function could not be  evaluated.   2. Right ventricular systolic function is normal. The right ventricular  size is not well visualized.   3. Left atrial size was mild to moderately dilated.   4. The mitral valve is normal in structure. No evidence of mitral valve  regurgitation.   5. The aortic valve was not well visualized. Aortic valve regurgitation  is not visualized.   6. The inferior vena cava is normal in size with <50% respiratory  variability, suggesting right atrial pressure of 8 mmHg.   Patient Profile     51 y.o. female with a history of HFpEF, morbid obesity, CKD III, hypertension, who presented with shortness of breath, being seen and evaluated for HFpEF exacerbation, volume overload.  Assessment & Plan    HFpEF with severe pulmonary hypertension -RHC 7/3 showed severely elevated right and left sided filling pressures (PA 95/44, PCWP 30) severe pulmonary hypertension with normal cardiac output,-lasix drip was discontinued on 7/6 d/t  increasing creatnine, she has been switched to torsemide 40 mg bid -Continue carvedilol 3.125 mg twice daily -Torsemide 40 mg twice daily -Will revisit restarting sildenafil as outpatient -We will need outpatient visit at heart failure clinic in [facility]  Hypertension -Blood pressure 128/50 -Blood pressure stable -Orthostatic vitals done this morning related to dizziness patient not orthostatic  CKD III -Creatinine 1.81 this morning improved closer to baseline -Monitor/trend -Daily BMP -Avoid nephrotoxic agents  Morbid Obesity -Recommend sleep study as outpatient  Hypokalemia -Potassium this morning 3.4 -K. Dur 40 mEq first dose now and daily -Monitor/trend/replete as needed -Continue daily K-Dur -Daily BMP     For questions  or updates, please contact [facility] Please consult [email or web address] for contact info under        Signed, Dorien Mayotte, NP  12/05/2021, 10:06 AM

## 2021-12-05 NOTE — Progress Notes (Signed)
PROGRESS NOTE    Sheila Hays  [id number] DOB: May 03, 1971  DOA: 11/20/2021 Date of Service: 12/05/21 PCP: [place] Narrative / Hospital Course:  Sheila Hays is a 51 year old female with morbid obesity, hypertension, hyperlipidemia, chronic HFpEF, CKD 3B, who presented to the ED on 11/20/2021 for evaluation of shortness of breath and chest pain. Initial blood pressure 202/90 in the ED with otherwise normal vitals, SPO2 of 95% on room air.  Labs were mostly unremarkable but BNP elevated 580.8.  Chest x-ray with cardiomegaly, prominent central pulmonary vessels concerning for CHF and sepsis small/minimal bilateral pleural effusions. Blood pressure was treated with Vasotec 0.625 mg IV in the ED, also given furosemide 40 mg IV. 11/20/21: Admitted to the hospital for further evaluation management of acute on chronic HFpEF requiring IV diuresis, close monitoring and management of hypertensive urgency. 06/29-07/04: Underwent RHC 11/25/2021, severe pulmonary HTN, requiring lasix gtt. HTN improved. Trial BiPap for hypercapnic acidosis likely d/t OHS, plan for outpatient sleep study and likley needs home NIV 07/05: cardiology reduced lasix gtt from 6 to 4 mg/h, plan transition to po torsemide when euvolemic / if Cr worsens. Fall in bathroom, no head trauma, no concerns on serial neuro checks.  07/06: Cr bumped up today to 1.9, she did have decent UOP yesterday but only -319 daily net. Net IO Since Admission: -3,707.98 mL [11/28/21 0855] but po intake seems inaccurately measured over 07/03-07/04 so question true net output. GIven renal fxn, I stopped lasix gtt and ordered po torsemide. Await further recommendations from cardiology. Given fall yesterday, will add CK to AM labs though doubt rhabdo would be contributing to renal fxn as diuresis effect seems much more likely.  07/07: moved to ICU per cardiology recs, placed on milrinone drip for a few days with plan to start  low-dose Lasix drip, placed PICC line and monitor CVP. BP low, MAP low, started norepi  07/08: C/o chest pain, same as her usual sternal pain. Cardiology recs: continue milrinone. D/c sildenafil and carvedilol d/t hypotension.  Await Co. oximetry panel and CVP measurements (understanding that a normal CVP is not the goal in the setting of right heart failure).  Consider resuming diuretic therapy tomorrow. Sill on milrinone and norepi this morning, BP improved. AM BMP showed improving Cr. PICC occluded, failed exchange 07/09: Cr stable from yesterday. BP low but holding on norepi. No PICC/Central access planned as of now. Cardiology following. For now remains on milrinone and norepinephrine. Of note, I discussed w/ patient inability to measure CVP may be an issue but for now there is not an urgent need for central acces. Received 1 dose IV Lasix. 07/10: Weaned off norepi.  BP stable.  Remains off carvedilol, sildenafil. 07/11: remains on milrinone, got furosemide 80 IV x1 today, may consider wean off milrinone soon if renal fxn tolerates vs transfer to [facility] for evaluation by advanced heart failure team. Would avoid sildenafil.  07/12: Per cardio note - Appears euvolemic, Net -2.1 L over the past 24 hours, Creatinine of 1.65, around baseline over the past year, Stop milrinone, start p.o. torsemide 40 mg twice daily. Pt moved out of ICU.  07/13: tolerating torsemide 40 mg bid, adding back low dose carvedilol 3.125 mg bid        Consultants:  Cardiology   Procedures: 11/25/2021 R heart catheterization     Subjective: Pt denied CP/SOB, asks about when she can go home!      ASSESSMENT & PLAN:   Principal Problem:  Acute on chronic heart failure with preserved ejection fraction (HFpEF) (HCC) Active Problems:   Hypertensive emergency   Essential hypertension   Chronic kidney disease, stage 3a (HCC)   Complex partial seizure disorder (HCC)   Morbid obesity (HCC)   Chest pain   Dizziness    Acute respiratory failure with hypoxia (HCC)   Hypercapnic acidosis   Pulmonary hypertension (HCC)   Acute on chronic heart failure with preserved ejection fraction (HFpEF) (HCC), POA Echo on showed EF 55 to 60%, diastolic function could not be evaluated, mild LVH, left atrium mild to moderately dilated. 07/07 moved to ICU per cardiology recs, placed on milrinone drip, placed PICC line and monitor CVP. Hypotensive, needed norepi --> off this lat 07/09  07/08-07/11 continuing milrinone. 07/11 Lasix IV x1, monitor renal fxn   D/c sildenafil and carvedilol d/t hypotension.  Unable to monitor CVP as cannot obtain access. will need sleep study as outpatient.  BP improved  07/12: per cardio Stop milrinone, start p.o. torsemide 40 mg twice daily. Pt moved out of ICU 07/13: added carvedilol 3.125   AKI on Chronic kidney disease, stage 3a (HCC) Renal function near baseline on admission up trended with IV diuresis, now back to around baseline, trended up slightly today Lab Results  Component Value Date   CREATININE 1.81 (H) 12/05/2021   CREATININE 1.65 (H) 12/04/2021   CREATININE 1.69 (H) 12/03/2021   Pulmonary hypertension (HCC), severe Likely related to untreated OHS/OSA, morbid obesity.   Right Heart Cath on 7/3 --showed evidence of severely elevated right and left-sided filling pressures, severe pulmonary hypertension and normal cardiac output. Initially was on Lasix drip which was d/c d/t AKI ICU on milrinone drip, renal fxn improving --> d/c'ed 07/12 Sildenafil was added but stopped d/t hypotension     Hypertensive emergency - resolved Initial BP 202/90, resulting in pulmonary edema and decompensated CHF. 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  A.m. VBG on 7/2 with pH of 7.29, PCO2 of 85, HCO3 40.9 and PO2 of 70. Outpatient sleep study overnight BiPAP Patient likely needs home NIV   Acute respiratory failure  with hypoxia (HCC) - resolved Patient presented with hypertensive heart failure and dyspnea.  Despite diuresis she continues to require oxygen.  Qualifies for home oxygen at 2 L/min.  Suspect due to underlying OHS, pulmonary HTN.   Home O2 ordered Supplemental O2, maintain sats >90% Sleep study recommended outpatient   Dizziness - resolved 7/1 - with BP control improved, patient having significant dizziness when up to ambulate.    Chest pain - recurrent  Atypical, has been reproducible on palpation, likely musculoskeletal etiology vs esophageal etiology.  Troponin very minimally elevated with flat trend and EKG is nonacute. Most likely demand ischemia. Not consistent with ACS. 7/1 - recurrent bout in AM, trop down to 18 from 28 and EKG remains normal. 7/8 - recurrent again, cardiology aware  Cardiology following   Morbid obesity (HCC) Body mass index is 46.9 kg/m. Complicates overall care and prognosis.  Recommend lifestyle modifications including physical activity and diet for weight loss and overall long-term health.   Highly suspect OSA and/or OHS. Recommend sleep study as outpatient. Qualifies for home O2 here.   Complex partial seizure disorder (HCC) Resumed oxcarbamazepine 600 mg p.o. twice daily; Keppra 500 mg p.o. twice daily Resumed on Lamictal at 25 mg p.o. twice daily because lamictal requires gradual increase dosing Follow-up with primary care and/or neurology for  further recommendations regarding Lamictal dosing Ativan 2 mg IV as needed for seizure, 2 doses ordered   Essential hypertension Presented with hypertensive emergency with pulmonary edema and CHF decompensation.  BP now controlled but with intermittent hypotension.        DVT prophylaxis: Lovenox Code Status: FULL Family Communication: pt declined call to family at this time  Disposition Plan / TOC needs: PCU for now,  Barriers to discharge / significant pending items: cardiology recs appreciated re:  goals and home rx going forward.             Objective: Vitals:   12/04/21 1700 12/04/21 1924 12/04/21 2308 12/05/21 0321  BP:  116/62 (!) 131/54 (!) 123/52  Pulse: 67 75 71 70  Resp: 18 18 18 16   Temp:  99.4 F (37.4 C) 98.9 F (37.2 C) 98.7 F (37.1 C)  TempSrc:  Oral Oral Oral  SpO2: 98% 92% 90% 98%  Weight:      Height:        Intake/Output Summary (Last 24 hours) at 12/05/2021 0716 Last data filed at 12/04/2021 1900 Gross per 24 hour  Intake 1308.08 ml  Output 925 ml  Net 383.08 ml    Filed Weights   11/25/21 0300 11/26/21 0349 11/29/21 0423  Weight: 131.7 kg 128.2 kg 120.2 kg    Examination:  Constitutional:  VS as above General Appearance: alert, well-developed, well-nourished, NAD Respiratory: Normal respiratory effort Cardiovascular: S1S2, RRR Musculoskeletal:  No clubbing/cyanosis of digits Neurological: No cranial nerve deficit on limited exam Psychiatric: Normal judgment/insight Flat mood and affect       Scheduled Medications:   vitamin C  500 mg Oral Daily   aspirin EC  81 mg Oral Daily   atorvastatin  40 mg Oral q1800   Chlorhexidine Gluconate Cloth  6 each Topical Daily   enoxaparin (LOVENOX) injection  0.5 mg/kg Subcutaneous Q24H   escitalopram  20 mg Oral q morning   folic acid  500 mcg Oral Daily   lamoTRIgine  25 mg Oral BID   levETIRAcetam  500 mg Oral BID   mometasone-formoterol  2 puff Inhalation BID   oxcarbazepine  600 mg Oral BID   pantoprazole  40 mg Oral Daily   sodium chloride flush  3 mL Intravenous Q12H   torsemide  40 mg Oral BID    Continuous Infusions:    PRN Medications:  albuterol, butalbital-acetaminophen-caffeine, calcium carbonate, HYDROcodone-acetaminophen, linaclotide, LORazepam, ondansetron **OR** ondansetron (ZOFRAN) IV, mouth rinse, senna-docusate, traZODone  Antimicrobials:  Anti-infectives (From admission, onward)    None       Data Reviewed: I have personally reviewed following  labs and imaging studies  CBC: Recent Labs  Lab 11/29/21 0511 12/01/21 0547 12/02/21 0830 12/03/21 0425  WBC 5.3 5.1 5.3 5.1  HGB 13.6 13.0 13.1 12.6  HCT 45.5 43.2 43.4 41.8  MCV 88.0 88.2 87.7 86.7  PLT 236 212 219 229    Basic Metabolic Panel: Recent Labs  Lab 12/01/21 0547 12/02/21 0830 12/03/21 0425 12/04/21 0504 12/05/21 0403  NA 141 141 138 140 139  K 4.0 3.8 3.4* 3.6 3.4*  CL 97* 96* 94* 98 94*  CO2 36* 36* 36* 34* 35*  GLUCOSE 97 91 89 92 85  BUN 36* 34* 32* 29* 32*  CREATININE 1.82* 2.00* 1.69* 1.65* 1.81*  CALCIUM 9.1 9.1 8.9 9.1 9.0    GFR: Estimated Creatinine Clearance: 48 mL/min (A) (by C-G formula based on SCr of 1.81 mg/dL (H)). Liver Function Tests: No  results for input(s): "AST", "ALT", "ALKPHOS", "BILITOT", "PROT", "ALBUMIN" in the last 168 hours. No results for input(s): "LIPASE", "AMYLASE" in the last 168 hours. No results for input(s): "AMMONIA" in the last 168 hours. Coagulation Profile: No results for input(s): "INR", "PROTIME" in the last 168 hours. Cardiac Enzymes: No results for input(s): "CKTOTAL", "CKMB", "CKMBINDEX", "TROPONINI" in the last 168 hours.  BNP (last 3 results) No results for input(s): "PROBNP" in the last 8760 hours. HbA1C: No results for input(s): "HGBA1C" in the last 72 hours. CBG: Recent Labs  Lab 11/29/21 1451  GLUCAP 90    Lipid Profile: No results for input(s): "CHOL", "HDL", "LDLCALC", "TRIG", "CHOLHDL", "LDLDIRECT" in the last 72 hours.  Thyroid Function Tests: No results for input(s): "TSH", "T4TOTAL", "FREET4", "T3FREE", "THYROIDAB" in the last 72 hours. Anemia Panel: No results for input(s): "VITAMINB12", "FOLATE", "FERRITIN", "TIBC", "IRON", "RETICCTPCT" in the last 72 hours. Urine analysis:    Component Value Date/Time   COLORURINE YELLOW (A) 06/14/2021 1830   APPEARANCEUR CLEAR (A) 06/14/2021 1830   LABSPEC 1.011 06/14/2021 1830   PHURINE 5.0 06/14/2021 1830   GLUCOSEU NEGATIVE 06/14/2021  1830   HGBUR NEGATIVE 06/14/2021 1830   BILIRUBINUR NEGATIVE 06/14/2021 1830   [place] 06/14/2021 1830   PROTEINUR 100 (A) 06/14/2021 1830   NITRITE NEGATIVE 06/14/2021 1830   LEUKOCYTESUR LARGE (A) 06/14/2021 1830   Sepsis Labs: @LABRCNTIP (procalcitonin:4,lacticidven:4)  No results found for this or any previous visit (from the past 240 hour(s)).       Radiology Studies last 96 hours: No results found.          LOS: 14 days    Emeterio Reeve, DO [facility] 12/05/2021, 7:16 AM   Staff may message me via secure chat in [place]  but this may not receive immediate response,  please page for urgent matters!  If 7PM-7AM, please contact night-coverage [email or web address]  Dictation software was used to generate the above note. Typos may occur and escape review, as with typed/written notes. Please contact Dr Sheppard Coil directly for clarity if needed.

## 2021-12-06 DIAGNOSIS — I5033 Acute on chronic diastolic (congestive) heart failure: Secondary | ICD-10-CM | POA: Diagnosis not present

## 2021-12-06 LAB — BASIC METABOLIC PANEL
Anion gap: 10 (ref 5–15)
BUN: 37 mg/dL — ABNORMAL HIGH (ref 6–20)
CO2: 34 mmol/L — ABNORMAL HIGH (ref 22–32)
Calcium: 9.3 mg/dL (ref 8.9–10.3)
Chloride: 97 mmol/L — ABNORMAL LOW (ref 98–111)
Creatinine, Ser: 2.04 mg/dL — ABNORMAL HIGH (ref 0.44–1.00)
GFR, Estimated: 29 mL/min — ABNORMAL LOW (ref >60)
Glucose, Bld: 85 mg/dL (ref 70–99)
Potassium: 3.6 mmol/L (ref 3.5–5.1)
Sodium: 141 mmol/L (ref 135–145)

## 2021-12-06 MED ORDER — CALCIUM CARBONATE ANTACID 500 MG PO CHEW: 1.0000 | CHEWABLE_TABLET | Freq: Three times a day (TID) | ORAL | Status: DC | PRN

## 2021-12-06 MED ORDER — FOLIC ACID 1 MG PO TABS: 500.0000 ug | ORAL_TABLET | Freq: Every day | ORAL | 0 refills | Status: DC

## 2021-12-06 MED ORDER — CARVEDILOL 3.125 MG PO TABS: 3.1250 mg | ORAL_TABLET | Freq: Two times a day (BID) | ORAL | 0 refills | Status: DC

## 2021-12-06 NOTE — Progress Notes (Signed)
Progress Note  Patient Name: Sheila Hays Date of Encounter: 12/06/2021  [facility] Cardiologist: Peter [place], MD   Subjective   She feels significantly better with no chest pain.  She reports significant improvement in shortness of breath and leg edema.  Renal function is slightly worse.  Inpatient Medications    Scheduled Meds:  vitamin C  500 mg Oral Daily   aspirin EC  81 mg Oral Daily   atorvastatin  40 mg Oral q1800   carvedilol  3.125 mg Oral BID WC   Chlorhexidine Gluconate Cloth  6 each Topical Daily   enoxaparin (LOVENOX) injection  0.5 mg/kg Subcutaneous Q24H   escitalopram  20 mg Oral q morning   folic acid  500 mcg Oral Daily   lamoTRIgine  25 mg Oral BID   levETIRAcetam  500 mg Oral BID   mometasone-formoterol  2 puff Inhalation BID   oxcarbazepine  600 mg Oral BID   pantoprazole  40 mg Oral Daily   sodium chloride flush  3 mL Intravenous Q12H   Continuous Infusions:  PRN Meds: albuterol, butalbital-acetaminophen-caffeine, calcium carbonate, HYDROcodone-acetaminophen, linaclotide, LORazepam, ondansetron **OR** ondansetron (ZOFRAN) IV, mouth rinse, senna-docusate, traZODone   Vital Signs    Vitals:   12/05/21 2332 12/06/21 0802 12/06/21 0939 12/06/21 0947  BP: 97/65 (!) 120/38 136/78   Pulse: 89 66 72   Resp: 18 16    Temp: 98.4 F (36.9 C) 98.5 F (36.9 C)    TempSrc:      SpO2: 98% 90% 90%   Weight:    119.2 kg  Height:        Intake/Output Summary (Last 24 hours) at 12/06/2021 1045 Last data filed at 12/05/2021 1900 Gross per 24 hour  Intake 220 ml  Output --  Net 220 ml       12/06/2021    9:47 AM 11/29/2021    4:23 AM 11/26/2021    3:49 AM  Last 3 Weights  Weight (lbs) 262 lb 12.8 oz 265 lb 1.6 oz 282 lb 10.1 oz  Weight (kg) 119.205 kg 120.249 kg 128.2 kg      Telemetry    Sinus rates 60-70- Personally Reviewed  ECG    No new tracings - Personally Reviewed  Physical Exam   GEN: No acute distress.   Neck: No  JVD Cardiac: RRR, no murmurs, rubs, or gallops.  Respiratory: Clear to auscultation. GI: Soft, nontender, obese,non-distended, bowel sounds present in all 4 quadrants MS: No edema; No deformity. Neuro:  Nonfocal  Psych: Normal affect    Labs    High Sensitivity Troponin:   Recent Labs  Lab 11/20/21 1352 11/20/21 1634 11/21/21 1019 11/23/21 0942  TROPONINIHS 23* 21* 28* 18*      Chemistry Recent Labs  Lab 12/04/21 0504 12/05/21 0403 12/06/21 0404  NA 140 139 141  K 3.6 3.4* 3.6  CL 98 94* 97*  CO2 34* 35* 34*  GLUCOSE 92 85 85  BUN 29* 32* 37*  CREATININE 1.65* 1.81* 2.04*  CALCIUM 9.1 9.0 9.3  GFRNONAA 37* 33* 29*  ANIONGAP 8 10 10      Lipids  No results for input(s): "CHOL", "TRIG", "HDL", "LABVLDL", "LDLCALC", "CHOLHDL" in the last 168 hours.   Hematology Recent Labs  Lab 12/01/21 0547 12/02/21 0830 12/03/21 0425  WBC 5.1 5.3 5.1  RBC 4.90 4.95 4.82  HGB 13.0 13.1 12.6  HCT 43.2 43.4 41.8  MCV 88.2 87.7 86.7  MCH 26.5 26.5 26.1  MCHC 30.1  30.2 30.1  RDW 15.7* 15.6* 15.3  PLT 212 219 229    Thyroid No results for input(s): "TSH", "FREET4" in the last 168 hours.  BNPNo results for input(s): "BNP", "PROBNP" in the last 168 hours.  DDimer No results for input(s): "DDIMER" in the last 168 hours.   Radiology    No results found.  Cardiac Studies   Right Herat Catheterization 11/25/2021 Successful right heart catheterization via the right antecubital vein. This showed evidence of severely elevated right and left-sided filling pressures, severe pulmonary hypertension and normal cardiac output.   RA: 22 mmHg RV: 92/18 with an end-diastolic pressure of 32 mmHg. PA: 95/44 with a mean of 64 mmHg PCW: 30 mmHg  Cardiac output: 7.4 with an index of 3.16. Pulmonary vascular resistance: 4.5 [place] units   Recommendations: The patient is significantly volume overloaded. Pulmonary hypertension is severe and seems to be of a mixed venous and arterial  etiology. We will start the patient on furosemide drip and the dose should be uptitrated based on urine output response.  Diuresis might be difficult due to the presence of severe pulmonary hypertension and likely RV dysfunction.  If renal function worsens with diuresis, she might require inotropic therapy for RV support and consideration to transfer to an advanced heart failure service.   Echocardiogram completed 11/21/2021  1. Left ventricular ejection fraction, by estimation, is 55 to 60%. The  left ventricle has normal function. The left ventricle has no regional  wall motion abnormalities. The left ventricular internal cavity size was  mildly dilated. There is mild left  ventricular hypertrophy. Left ventricular diastolic function could not be  evaluated.   2. Right ventricular systolic function is normal. The right ventricular  size is not well visualized.   3. Left atrial size was mild to moderately dilated.   4. The mitral valve is normal in structure. No evidence of mitral valve  regurgitation.   5. The aortic valve was not well visualized. Aortic valve regurgitation  is not visualized.   6. The inferior vena cava is normal in size with <50% respiratory  variability, suggesting right atrial pressure of 8 mmHg.   Patient Profile     51 y.o. female with a history of HFpEF, morbid obesity, CKD III, hypertension, who presented with shortness of breath, being seen and evaluated for HFpEF exacerbation, volume overload.  Assessment & Plan    HFpEF with severe pulmonary hypertension -RHC 7/3 showed severely elevated right and left sided filling pressures (PA 95/44, PCWP 30) severe pulmonary hypertension with normal cardiac output,-lasix drip was discontinued on 7/6 d/t increasing creatnine, she was treated with few days of milrinone with improvement. Urine output is not accurate but she is down 12 kg since her admission and I suspect that she is currently euvolemic. She was on torsemide  40 mg twice daily but that is currently on hold due to mildly worsening renal function. I suspect that the patient can likely be discharged home on 20 mg twice daily of torsemide to be resumed tomorrow.  We can arrange for close follow-up in our office.  Hypertension -Continue small dose carvedilol.  The patient did have hypotension when milrinone was started and thus the dose of carvedilol was decreased.  CKD III -She does have underlying chronic kidney disease and previous baseline creatinine is around 1.5.  Creatinine today is 2.  Hold diuretics for a day and resume tomorrow.  Morbid Obesity -Recommend sleep study as outpatient       For  questions or updates, please contact [facility] Please consult [email or web address] for contact info under        Signed, Lorine Bears, MD  12/06/2021, 10:45 AM

## 2021-12-06 NOTE — TOC Transition Note (Signed)
Transition of Care [facility]) - CM/SW Discharge Note   Patient Details  Name: Sheila Hays MRN: [id number] Date of Birth: Oct 10, 1970  Transition of Care [facility]) CM/SW Contact:  Gildardo Griffes, LCSW Phone Number: 12/06/2021, 11:52 AM   Clinical Narrative:     Patient to discharge home today, patient confirms her O2 was already delivered to home, reports someone will bring it for transport at time of discharge. Reports she declines HH at this time, states she has home support.   No further discharge needs identified.    Final next level of care: Home/Self Care Barriers to Discharge: No Barriers Identified   Patient Goals and CMS Choice Patient states their goals for this hospitalization and ongoing recovery are:: to go home CMS Medicare.gov Compare Post Acute Care list provided to:: Patient Choice offered to / list presented to : Patient  Discharge Placement                       Discharge Plan and Services                                     Social Determinants of Health (SDOH) Interventions     Readmission Risk Interventions     No data to display

## 2021-12-06 NOTE — Progress Notes (Signed)
Pt refusing bed alarm - states " get this alarm off my bed"/ encouraged pt to call out for assistance before getting out of bed/ call bell and phone within reach/ will monitor.

## 2021-12-06 NOTE — Progress Notes (Signed)
Heart Failure Nurse Navigator Note  Met with patient today, she had just finished breakfast sitting on the edge of the bed in no acute distress.  She states at home that she has a support of her fianc.  She states that she checks her blood pressure twice a day and record does not have a scale for doing daily weights.  Supplied her with a scale from TOC, discussed daily weights first thing in the morning after going to the bathroom, recording and what to report to her care providers.  She voices understanding  Also discussed low-sodium diet, fluid restrict and changes in symptoms.    She has follow-up in the outpatient heart failure clinic on July 27 at 4 PM.  She had no further questions.  Jimsey Shaffer RN [facility] 

## 2021-12-06 NOTE — Progress Notes (Signed)
Discharge instructions explained to pt/ verbalized an understanding/ iv and tele removed/ will transport off unit via wheelchair.  

## 2021-12-06 NOTE — Care Management Important Message (Signed)
Important Message  Patient Details  Name: Sheila Hays MRN: [id number] Date of Birth: June 04, 1970   Medicare Important Message Given:  Yes     [name] 12/06/2021, 12:55 PM

## 2021-12-06 NOTE — Discharge Summary (Signed)
Physician Discharge Summary   Patient: Sheila Hays MRN: [id number]  DOB: 13-Apr-1971   Admit:     Date of Admission: 11/20/2021 Admitted from: home   Discharge: Date of discharge: 12/06/21 Disposition: Home Condition at discharge: good  CODE STATUS: FULL   Diet recommendation: Cardiac and Carb modified diet   Discharge Physician: Emeterio Reeve, DO [facility]     PCP: [place]  Recommendations for Outpatient Follow-up:  Follow up with PCP [place] in 1-2 weeks Please obtain labs/tests:  Sleep study Cardiology to arrange BMP early next week (12/09/21 Mon or soon after)  Please follow up on the following pending results: none Please ensure compliance w/ meds and cardiology f/u    Discharge Instructions     Diet - low sodium heart healthy   Complete by: As directed    Increase activity slowly   Complete by: As directed          Hospital Course:  Sheila Hays is a 51 year old female with morbid obesity, hypertension, hyperlipidemia, chronic HFpEF, CKD 3B, who presented to the ED on 11/20/2021 for evaluation of shortness of breath and chest pain. Initial blood pressure 202/90 in the ED with otherwise normal vitals, SPO2 of 95% on room air.  Labs were mostly unremarkable but BNP elevated 580.8.  Chest x-ray with cardiomegaly, prominent central pulmonary vessels concerning for CHF and sepsis small/minimal bilateral pleural effusions. Blood pressure was treated with Vasotec 0.625 mg IV in the ED, also given furosemide 40 mg IV. 11/20/21: Admitted to the hospital for further evaluation management of acute on chronic HFpEF requiring IV diuresis, close monitoring and management of hypertensive urgency. 06/29-07/04: Underwent RHC 11/25/2021, severe pulmonary HTN, requiring lasix gtt. HTN improved. Trial BiPap for hypercapnic acidosis likely d/t OHS, plan for outpatient sleep study and likley needs home NIV 07/05:  cardiology reduced lasix gtt from 6 to 4 mg/h, plan transition to po torsemide when euvolemic / if Cr worsens. Fall in bathroom, no head trauma, no concerns on serial neuro checks.  07/06: Cr bumped up today to 1.9, she did have decent UOP yesterday but only -319 daily net. Net IO Since Admission: -3,707.98 mL [11/28/21 0855] but po intake seems inaccurately measured over 07/03-07/04 so question true net output. GIven renal fxn, I stopped lasix gtt and ordered po torsemide. Await further recommendations from cardiology. Given fall yesterday, will add CK to AM labs though doubt rhabdo would be contributing to renal fxn as diuresis effect seems much more likely.  07/07: moved to ICU per cardiology recs, placed on milrinone drip for a few days with plan to start low-dose Lasix drip, placed PICC line and monitor CVP. BP low, MAP low, started norepi  07/08: C/o chest pain, same as her usual sternal pain. Cardiology recs: continue milrinone. D/c sildenafil and carvedilol d/t hypotension.  Await Co. oximetry panel and CVP measurements (understanding that a normal CVP is not the goal in the setting of right heart failure).  Consider resuming diuretic therapy tomorrow. Sill on milrinone and norepi this morning, BP improved. AM BMP showed improving Cr. PICC occluded, failed exchange 07/09: Cr stable from yesterday. BP low but holding on norepi. No PICC/Central access planned as of now. Cardiology following. For now remains on milrinone and norepinephrine. Of note, I discussed w/ patient inability to measure CVP may be an issue but for now there is not an urgent need for central acces. Received 1 dose IV Lasix. 07/10: Weaned off  norepi.  BP stable.  Remains off carvedilol, sildenafil. 07/11: remains on milrinone, got furosemide 80 IV x1 today, may consider wean off milrinone soon if renal fxn tolerates vs transfer to [facility] for evaluation by advanced heart failure team. Would avoid sildenafil.  07/12: Per cardio note -  Appears euvolemic, Net -2.1 L over the past 24 hours, Creatinine of 1.65, around baseline over the past year, Stop milrinone, start p.o. torsemide 40 mg twice daily. Pt moved out of ICU.  07/13: tolerating torsemide 40 mg bid, adding back low dose carvedilol 3.125 mg bid per cardiology.  07/14: Creatinine trended up again today --> 2.04. Per my discussion w/ Dr Fletcher Anon (cardiology) can be d/c home today to start po torsemide 20 mg bid starting tomorrow, continue Coreg, and his office will arrange BMP early next week              Consultants:  Cardiology    Procedures: 11/25/2021 R heart catheterization        Subjective: Pt denied CP/SOB, asks about when she can go home! No CP/SOB, comfortable on room air.         Discharge Diagnoses: Principal Problem:   Acute on chronic heart failure with preserved ejection fraction (HFpEF) (HCC) Active Problems:   Hypertensive emergency   Essential hypertension   Chronic kidney disease, stage 3a (HCC)   Complex partial seizure disorder ([place])   Morbid obesity ([place])   Chest pain   Dizziness   Acute respiratory failure with hypoxia (HCC)   Hypercapnic acidosis   Pulmonary hypertension ([place])    Assessment & Plan:  Acute on chronic heart failure with preserved ejection fraction (HFpEF) ([place]), POA Echo on showed EF 55 to [id number], diastolic function could not be evaluated, mild LVH, left atrium mild to moderately dilated. See hospital course above  07/14: d/w cardiology team - Dr Fletcher Anon recommends discharge today 12/06/21  on po torsemide 20 mg bid to start tomorrow and his office will arrange BMP early next week    AKI on Chronic kidney disease, stage 3a ([place]) Renal function near baseline on admission up trended with IV diuresis, now back to around baseline, trended up slightly and continues to climb - question medication effect vs cardiorenal syndrome d/w cardiology team - Dr Fletcher Anon recommends discharge today 12/06/21  on po torsemide 20 mg  bid to start tomorrow and his office will arrange BMP early next week  Recent Labs       Lab Results  Component Value Date    CREATININE 2.04 (H) 12/06/2021    CREATININE 1.81 (H) 12/05/2021    CREATININE 1.65 (H) 12/04/2021      Pulmonary hypertension ([place]), severe Likely related to untreated OHS/OSA, morbid obesity.   Right Heart Cath on 7/3 --showed evidence of severely elevated right and left-sided filling pressures, severe pulmonary hypertension and normal cardiac output. See hospital course     Hypertensive emergency - resolved Initial BP 202/90, resulting in pulmonary edema and decompensated CHF. 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  Outpatient sleep study   Acute respiratory failure with hypoxia ([place]) - resolved Patient presented with hypertensive heart failure and dyspnea.  Despite diuresis she continues to require oxygen.  Qualifies for home oxygen at 2 L/min.  Suspect due to underlying OHS, pulmonary HTN.   Home O2 ordered Has been saturating well on RA as of 07/14 Supplemental O2, maintain sats >90% Sleep study recommended outpatient  Dizziness - resolved then recurred 7/1 - with BP control improved, patient having significant dizziness when up to ambulate.  7/13 - has been supine/essentially bedbound until today, encourage OOB and sitting up to eat    Chest pain - recurrent  Atypical, has been reproducible on palpation, likely musculoskeletal etiology vs esophageal etiology.  Troponin very minimally elevated with flat trend and EKG is nonacute. Most likely demand ischemia. Not consistent with ACS. 7/1 - recurrent bout in AM, trop down to 18 from 28 and EKG remains normal. 7/8 - recurrent again, cardiology aware  Cardiology following   Morbid obesity ([place]) Body mass index is 46.9 kg/m. Complicates overall care and prognosis.  Recommend lifestyle modifications including physical activity and  diet for weight loss and overall long-term health.   Highly suspect OSA and/or OHS. Recommend sleep study as outpatient. Qualifies for home O2 here.   Complex partial seizure disorder (HCC) Resumed oxcarbamazepine 600 mg p.o. twice daily; Keppra 500 mg p.o. twice daily Resumed on Lamictal at 25 mg p.o. twice daily because lamictal requires gradual increase dosing Follow-up with primary care and/or neurology for further recommendations regarding Lamictal dosing Ativan 2 mg IV as needed for seizure, 2 doses ordered   Essential hypertension Presented with hypertensive emergency with pulmonary edema and CHF decompensation.  BP now controlled but with intermittent hypotension.         Discharge Instructions  Allergies as of 12/06/2021       Reactions   Ciprofloxacin Itching        Medication List     STOP taking these medications    butalbital-acetaminophen-caffeine 50-325-40 MG tablet Commonly known as: FIORICET   furosemide 40 MG tablet Commonly known as: LASIX   indomethacin 25 MG capsule Commonly known as: INDOCIN   irbesartan 75 MG tablet Commonly known as: AVAPRO   isosorbide mononitrate 30 MG 24 hr tablet Commonly known as: IMDUR   loratadine 10 MG tablet Commonly known as: CLARITIN   nitroGLYCERIN 0.4 MG SL tablet Commonly known as: Nitrostat   Potassium Chloride ER 20 MEQ Tbcr   Symbicort 160-4.5 MCG/ACT inhaler Generic drug: budesonide-formoterol   zinc gluconate 50 MG tablet       TAKE these medications    Advair Diskus 250-50 MCG/ACT Aepb Generic drug: fluticasone-salmeterol Inhale 1 puff into the lungs 2 (two) times daily.   albuterol 108 (90 Base) MCG/ACT inhaler Commonly known as: VENTOLIN HFA Inhale 2 puffs into the lungs every 4 (four) hours as needed.   aspirin EC 81 MG tablet Take 81 mg by mouth daily.   atorvastatin 40 MG tablet Commonly known as: LIPITOR Take 1 tablet (40 mg total) by mouth daily at 6 PM.   calcium  carbonate 500 MG chewable tablet Commonly known as: TUMS - dosed in mg elemental calcium Chew 1 tablet (200 mg of elemental calcium total) by mouth 3 (three) times daily as needed for indigestion or heartburn.   carvedilol 3.125 MG tablet Commonly known as: COREG Take 1 tablet (3.125 mg total) by mouth 2 (two) times daily with a meal. What changed:  medication strength how much to take   escitalopram 20 MG tablet Commonly known as: LEXAPRO Take 20 mg by mouth every morning.   folic acid 1 MG tablet Commonly known as: FOLVITE Take 0.5 tablets (0.5 mg total) by mouth daily. Start taking on: December 07, 2021 What changed:  medication strength how much to take   ipratropium-albuterol 0.5-2.5 (3) MG/3ML Soln Commonly known as: DUONEB Take  3 mLs by nebulization every 6 (six) hours as needed.   lamoTRIgine 25 MG tablet Commonly known as: LAMICTAL Take 25 mg by mouth 2 (two) times daily.   levETIRAcetam 500 MG tablet Commonly known as: KEPPRA Take 1 tablet (500 mg total) by mouth 2 (two) times daily.   Linzess 145 MCG Caps capsule Generic drug: linaclotide Take 145 mcg by mouth daily as needed.   montelukast 10 MG tablet Commonly known as: SINGULAIR Take 10 mg by mouth daily.   omeprazole 20 MG capsule Commonly known as: PRILOSEC Take 20 mg by mouth daily.   oxcarbazepine 600 MG tablet Commonly known as: TRILEPTAL Take 600 mg by mouth 2 (two) times daily.   traZODone 100 MG tablet Commonly known as: DESYREL Take 100 mg by mouth at bedtime as needed for sleep.   vitamin C 500 MG tablet Commonly known as: ASCORBIC ACID Take 500 mg by mouth daily.               Durable Medical Equipment  (From admission, onward)           Start     Ordered   12/06/21 0916  For home use only DME Walker rolling  Once       Question Answer Comment  Walker: With 5 Inch Wheels   Patient needs a walker to treat with the following condition Generalized weakness      12/06/21  0915   11/23/21 1228  For home use only DME oxygen  Once       Question Answer Comment  Length of Need Lifetime   Mode or (Route) Nasal cannula   Liters per Minute 2   Frequency Continuous (stationary and portable oxygen unit needed)   Oxygen delivery system Gas      11/23/21 1227              Allergies  Allergen Reactions   Ciprofloxacin Itching       Discharge Exam: Vitals:   12/06/21 0939 12/06/21 1133  BP: 136/78 (!) 151/75  Pulse: 72 71  Resp:  19  Temp:  97.8 F (36.6 C)  SpO2: 90% 100%  General: Pt is alert, awake, not in acute distress Cardiovascular: RRR, S1/S2 +, no rubs, no gallops Respiratory: CTA bilaterally, no wheezing, no rhonchi Abdominal: Soft, NT, ND, bowel sounds + Extremities: no edema, no cyanosis     The results of significant diagnostics from this hospitalization (including imaging, microbiology, ancillary and laboratory) are listed below for reference.     Microbiology: No results found for this or any previous visit (from the past 240 hour(s)).   Labs: BNP (last 3 results) Recent Labs    11/20/21 1352  BNP 580.8*   Basic Metabolic Panel: Recent Labs  Lab 12/02/21 0830 12/03/21 0425 12/04/21 0504 12/05/21 0403 12/06/21 0404  NA 141 138 140 139 141  K 3.8 3.4* 3.6 3.4* 3.6  CL 96* 94* 98 94* 97*  CO2 36* 36* 34* 35* 34*  GLUCOSE 91 89 92 85 85  BUN 34* 32* 29* 32* 37*  CREATININE 2.00* 1.69* 1.65* 1.81* 2.04*  CALCIUM 9.1 8.9 9.1 9.0 9.3   Liver Function Tests: No results for input(s): "AST", "ALT", "ALKPHOS", "BILITOT", "PROT", "ALBUMIN" in the last 168 hours. No results for input(s): "LIPASE", "AMYLASE" in the last 168 hours. No results for input(s): "AMMONIA" in the last 168 hours. CBC: Recent Labs  Lab 12/01/21 0547 12/02/21 0830 12/03/21 0425  WBC 5.1 5.3 5.1  HGB  13.0 13.1 12.6  HCT 43.2 43.4 41.8  MCV 88.2 87.7 86.7  PLT 212 219 229   Cardiac Enzymes: No results for input(s): "CKTOTAL", "CKMB",  "CKMBINDEX", "TROPONINI" in the last 168 hours. BNP: Invalid input(s): "POCBNP" CBG: Recent Labs  Lab 11/29/21 1451  GLUCAP 90   D-Dimer No results for input(s): "DDIMER" in the last 72 hours. Hgb A1c No results for input(s): "HGBA1C" in the last 72 hours. Lipid Profile No results for input(s): "CHOL", "HDL", "LDLCALC", "TRIG", "CHOLHDL", "LDLDIRECT" in the last 72 hours. Thyroid function studies No results for input(s): "TSH", "T4TOTAL", "T3FREE", "THYROIDAB" in the last 72 hours.  Invalid input(s): "FREET3" Anemia work up No results for input(s): "VITAMINB12", "FOLATE", "FERRITIN", "TIBC", "IRON", "RETICCTPCT" in the last 72 hours. Urinalysis    Component Value Date/Time   COLORURINE YELLOW (A) 06/14/2021 1830   APPEARANCEUR CLEAR (A) 06/14/2021 1830   LABSPEC 1.011 06/14/2021 1830   PHURINE 5.0 06/14/2021 1830   GLUCOSEU NEGATIVE 06/14/2021 1830   HGBUR NEGATIVE 06/14/2021 1830   BILIRUBINUR NEGATIVE 06/14/2021 1830   KETONESUR NEGATIVE 06/14/2021 1830   PROTEINUR 100 (A) 06/14/2021 1830   NITRITE NEGATIVE 06/14/2021 1830   LEUKOCYTESUR LARGE (A) 06/14/2021 1830   Sepsis Labs Recent Labs  Lab 12/01/21 0547 12/02/21 0830 12/03/21 0425  WBC 5.1 5.3 5.1   Microbiology No results found for this or any previous visit (from the past 240 hour(s)). Imaging ECHOCARDIOGRAM COMPLETE  Result Date: 11/21/2021    ECHOCARDIOGRAM REPORT   Patient Name:   Sheila Hays [facility] Date of Exam: 11/21/2021 Medical Rec #:  [id number]          Height:       66.0 in Accession #:    [id number]         Weight:       290.6 lb Date of Birth:  1970/09/12          BSA:          2.344 m Patient Age:    64 years           BP:           173/90 mmHg Patient Gender: F                  HR:           72 bpm. Exam Location:  [facility] Procedure: 2D Echo, Cardiac Doppler and Color Doppler Indications:     Dyspnea R06.00  History:         Patient has prior history of Echocardiogram examinations, most                   recent 11/30/2020. CHF; Risk Factors:Hypertension. CKD.  Sonographer:     Sherrie Sport Referring Phys:  [id number] AMY N COX Diagnosing Phys: Kate Sable MD  Sonographer Comments: Technically challenging study due to limited acoustic windows, no apical window and suboptimal subcostal window. IMPRESSIONS  1. Left ventricular ejection fraction, by estimation, is 55 to 60%. The left ventricle has normal function. The left ventricle has no regional wall motion abnormalities. The left ventricular internal cavity size was mildly dilated. There is mild left ventricular hypertrophy. Left ventricular diastolic function could not be evaluated.  2. Right ventricular systolic function is normal. The right ventricular size is not well visualized.  3. Left atrial size was mild to moderately dilated.  4. The mitral valve is normal in structure. No evidence of mitral valve regurgitation.  5. The aortic valve was  not well visualized. Aortic valve regurgitation is not visualized.  6. The inferior vena cava is normal in size with <50% respiratory variability, suggesting right atrial pressure of 8 mmHg. FINDINGS  Left Ventricle: Left ventricular ejection fraction, by estimation, is 55 to 60%. The left ventricle has normal function. The left ventricle has no regional wall motion abnormalities. The left ventricular internal cavity size was mildly dilated. There is  mild left ventricular hypertrophy. Left ventricular diastolic function could not be evaluated. Right Ventricle: The right ventricular size is not well visualized. No increase in right ventricular wall thickness. Right ventricular systolic function is normal. Left Atrium: Left atrial size was mild to moderately dilated. Right Atrium: Right atrial size was not well visualized. Pericardium: Trivial pericardial effusion is present. Mitral Valve: The mitral valve is normal in structure. No evidence of mitral valve regurgitation. Tricuspid Valve: The tricuspid valve is normal in  structure. Tricuspid valve regurgitation is not demonstrated. Aortic Valve: The aortic valve was not well visualized. Aortic valve regurgitation is not visualized. Pulmonic Valve: The pulmonic valve was normal in structure. Pulmonic valve regurgitation is not visualized. Aorta: The aortic root is normal in size and structure. Venous: The inferior vena cava is normal in size with less than 50% respiratory variability, suggesting right atrial pressure of 8 mmHg. IAS/Shunts: The interatrial septum was not well visualized.  LEFT VENTRICLE PLAX 2D LVIDd:         5.30 cm LVIDs:         3.70 cm LV PW:         1.30 cm LV IVS:        1.50 cm LVOT diam:     2.00 cm LVOT Area:     3.14 cm  LEFT ATRIUM         Index LA diam:    5.20 cm 2.22 cm/m   AORTA Ao Root diam: 2.83 cm  SHUNTS Systemic Diam: 2.00 cm Debbe Odea MD Electronically signed by Debbe Odea MD Signature Date/Time: 11/21/2021/2:06:55 PM    Final       Time coordinating discharge: Over 30 minutes  SIGNED:  Sunnie Nielsen DO [facility]

## 2021-12-11 ENCOUNTER — Ambulatory Visit (INDEPENDENT_AMBULATORY_CARE_PROVIDER_SITE_OTHER): Payer: Medicare Other | Admitting: Medical

## 2021-12-11 ENCOUNTER — Telehealth: Payer: Self-pay

## 2021-12-11 ENCOUNTER — Encounter: Payer: Self-pay | Admitting: Medical

## 2021-12-11 VITALS — BP 137/84 | HR 89 | Ht 66.0 in | Wt 267.4 lb

## 2021-12-11 DIAGNOSIS — I272 Pulmonary hypertension, unspecified: Secondary | ICD-10-CM

## 2021-12-11 DIAGNOSIS — I10 Essential (primary) hypertension: Secondary | ICD-10-CM | POA: Diagnosis not present

## 2021-12-11 DIAGNOSIS — I38 Endocarditis, valve unspecified: Secondary | ICD-10-CM

## 2021-12-11 DIAGNOSIS — I5032 Chronic diastolic (congestive) heart failure: Secondary | ICD-10-CM

## 2021-12-11 DIAGNOSIS — Z6841 Body Mass Index (BMI) 40.0 and over, adult: Secondary | ICD-10-CM

## 2021-12-11 DIAGNOSIS — G4733 Obstructive sleep apnea (adult) (pediatric): Secondary | ICD-10-CM

## 2021-12-11 NOTE — Patient Instructions (Addendum)
Medication Instructions:  Your physician recommends that you continue on your current medications as directed. Please refer to the Current Medication list given to you today.  *If you need a refill on your cardiac medications before your next appointment, please call your pharmacy*  Lab Work: NONE ordered at this time of appointment   If you have labs (blood work) drawn today and your tests are completely normal, you will receive your results only by: MyChart Message (if you have MyChart) OR A paper copy in the mail If you have any lab test that is abnormal or we need to change your treatment, we will call you to review the results.  Testing/Procedures: NONE ordered at this time of appointment   Follow-Up: At [facility], you and your health needs are our priority.  As part of our continuing mission to provide you with exceptional heart care, we have created designated Provider Care Teams.  These Care Teams include your primary Cardiologist (physician) and Advanced Practice Providers (APPs -  Physician Assistants and Nurse Practitioners) who all work together to provide you with the care you need, when you need it.    Your next appointment:   6-8 week(s)  The format for your next appointment:   In Person  Provider:   You may see one of the following Advanced Practice Providers on your designated Care Team:   Nicolasa Ducking, NP Eula Listen, PA-C Cadence Fransico Michael, PA-C   Other Instructions You have been referred to Pulmonology   Important Information About Sugar

## 2021-12-11 NOTE — Telephone Encounter (Signed)
   Post hospital discharge note for Heart Failure   Call to patient.  She states she feels she is doing well.  Continues to weigh herself daily, usually getting 263#. Had gone up 2# and doctor put her back on the torsemide. Is now back down to 263#.  She had no symptoms of SOB, fatigue,increased swelling, PND or orthopnea.  She is sticking with low sodium diet and fluid restriction.  Went over medication list, she discontinued the medications she was ordered to and changed the dosage of the coreg and folic acid.  She states she has an appointment in the cardiology office this afternoon - to arrive at 2:55 PM.  Discussed her appointment in the heart failure clinic on July 27 at 4 PM.  She states transportation is not a problem.    She had no further questions.  Tresa Endo RN [facility]

## 2021-12-11 NOTE — Progress Notes (Signed)
Cardiology Office Note:    Date:  12/12/2021   ID:  Sheila Hays [birth year], MRN [id number]  PCP:  [place] Cardiologist:  Peter Martinique, MD  [place] Electrophysiologist:  None   Referring MD: [place]   Chief Complaint: Hospital follow-up  History of Present Illness:    Sheila Hays is a 51 y.o. female with a hx of HFimpEF, CKD stage 3, seizure disorder, uncontrolled HTN, and GERD who is being seen for hospital follow-up.   Sheila Hays previously underwent nuclear stress test in 2016 that was abnormal, notable for perfusion defect of the lateral inferior segment with an EF of 35%. Echo at that time demonstrated an EF of 25-30%, mildly to moderately dilated LV cavity size, mild to moderate concentric LVH, mild to moderate MR, mild TR, PASP 55-60 mmHg, and a trivial pericardial effusion. Repeat echo in 2018 demonstrated a low normal LVSF with an EF of 50-55%, normal wall motion.    We previously evaluated her at [facility] in [place] for chest pain following transfer from [facility]. Nuclear stress test at that time showed a small defect of mild severity in the basal anteroseptal and mid anteroseptal location consistent with ischemia vs breast attenuation artifact with an EF of 45-54%. Overall, this was a low risk study.    Echo during admission in 11/2020 for volume overload in the setting of poorly controlled hypertension showed an EF of 55-60%, no RWMA, normal LV diastolic function parameters, normal RV systolic function and ventricular cavity size, and trivial mitral regurgitation.  She was admitted 6/28-7/14 for shortness of breath and chest pain. BNP ws 500s. CXR with b/l pleural effusions and concern for sepsis. She was admitted for diuresis with IV lasix and hypertensive urgency. Gay 11/25/21 showed severe pulmonary HTN. She had a fall in the bathroom with negative work-up. She was moved to the ICU for  milrinone drip and PICC line. Sildenafil and BB were held for hypotension. Required norepinephrine. She was eventually transitioned to torsmide 40mg  daily.   Today, the patient reports she is doing well. She denies chest pain, SOB, LLE, orthopnea, or pnd. She noted weights were climbing 263>267 over the last few days. PCP started the patient back on torsemide 20mg  yesterday. Also recommended fluid restriction. PCP referred her to nephrology. She has labs in 2 days. She denies chest pain or significant shortness of breath. No LLE, orthopnea, or pnd.   Past Medical History:  Diagnosis Date   CHF (congestive heart failure) (HCC)    Chronic kidney disease (CKD), stage III (moderate) (HCC)    GERD (gastroesophageal reflux disease)    Hypertension    Seizures ([place])    Followed at [facility]    Past Surgical History:  Procedure Laterality Date   RIGHT HEART CATH N/A 11/25/2021   Procedure: RIGHT HEART CATH;  Surgeon: Wellington Hampshire, MD;  Location: [facility];  Service: Cardiovascular;  Laterality: N/A;    Current Medications: Current Meds  Medication Sig   ADVAIR DISKUS 250-50 MCG/ACT AEPB Inhale 1 puff into the lungs 2 (two) times daily.   albuterol (VENTOLIN HFA) 108 (90 Base) MCG/ACT inhaler Inhale 2 puffs into the lungs every 4 (four) hours as needed.   aspirin EC 81 MG tablet Take 81 mg by mouth daily.   atorvastatin (LIPITOR) 40 MG tablet Take 1 tablet (40 mg total) by mouth daily at 6 PM.   carvedilol (COREG) 3.125 MG tablet Take  1 tablet (3.125 mg total) by mouth 2 (two) times daily with a meal.   folic acid (FOLVITE) 1 MG tablet Take 0.5 tablets (0.5 mg total) by mouth daily.   ipratropium-albuterol (DUONEB) 0.5-2.5 (3) MG/3ML SOLN Take 3 mLs by nebulization every 6 (six) hours as needed.   lamoTRIgine (LAMICTAL) 25 MG tablet Take 25 mg by mouth 2 (two) times daily.   LINZESS 145 MCG CAPS capsule Take 145 mcg by mouth daily as needed.   montelukast (SINGULAIR) 10 MG tablet Take  10 mg by mouth daily.   omeprazole (PRILOSEC) 20 MG capsule Take 20 mg by mouth daily.   oxcarbazepine (TRILEPTAL) 600 MG tablet Take 600 mg by mouth 2 (two) times daily.   rosuvastatin (CRESTOR) 20 MG tablet Take 20 mg by mouth at bedtime.   torsemide (DEMADEX) 20 MG tablet Take 20 mg by mouth 2 (two) times daily.   traZODone (DESYREL) 100 MG tablet Take 100 mg by mouth at bedtime as needed for sleep.   vitamin C (ASCORBIC ACID) 500 MG tablet Take 500 mg by mouth daily.     Allergies:   Ciprofloxacin   Social History   Socioeconomic History   Marital status: Single    Spouse name: Not on file   Number of children: Not on file   Years of education: Not on file   Highest education level: Not on file  Occupational History   Not on file  Tobacco Use   Smoking status: Former   Smokeless tobacco: Former    Quit date: 03/06/2017  Substance and Sexual Activity   Alcohol use: Not Currently   Drug use: Never   Sexual activity: Not Currently  Other Topics Concern   Not on file  Social History Narrative   Not on file   Social Determinants of Health   Financial Resource Strain: Not on file  Food Insecurity: Not on file  Transportation Needs: Not on file  Physical Activity: Not on file  Stress: Not on file  Social Connections: Not on file     Family History: The patient's family history includes Diabetes in her sister; Heart failure in her sister; Hypertension in her father and mother; Seizures in her sister.  ROS:   Please see the history of present illness.     All other systems reviewed and are negative.  EKGs/Labs/Other Studies Reviewed:    The following studies were reviewed today:  Cardiac cath 11/25/21   Hemodynamic findings consistent with severe pulmonary hypertension.   Successful right heart catheterization via the right antecubital vein. This showed evidence of severely elevated right and left-sided filling pressures, severe pulmonary hypertension and normal  cardiac output.   RA: 22 mmHg RV: 92/18 with an end-diastolic pressure of 32 mmHg. PA: 95/44 with a mean of 64 mmHg PCW: 30 mmHg  Cardiac output: 7.4 with an index of 3.16. Pulmonary vascular resistance: 4.5 Woods units   Recommendations: The patient is significantly volume overloaded. Pulmonary hypertension is severe and seems to be of a mixed venous and arterial etiology. We will start the patient on furosemide drip and the dose should be uptitrated based on urine output response.  Diuresis might be difficult due to the presence of severe pulmonary hypertension and likely RV dysfunction.  If renal function worsens with diuresis, she might require inotropic therapy for RV support and consideration to transfer to an advanced heart failure service.    Echo 10/2021  1. Left ventricular ejection fraction, by estimation, is 55 to 60%.  The  left ventricle has normal function. The left ventricle has no regional  wall motion abnormalities. The left ventricular internal cavity size was  mildly dilated. There is mild left  ventricular hypertrophy. Left ventricular diastolic function could not be  evaluated.   2. Right ventricular systolic function is normal. The right ventricular  size is not well visualized.   3. Left atrial size was mild to moderately dilated.   4. The mitral valve is normal in structure. No evidence of mitral valve  regurgitation.   5. The aortic valve was not well visualized. Aortic valve regurgitation  is not visualized.   6. The inferior vena cava is normal in size with <50% respiratory  variability, suggesting right atrial pressure of 8 mmHg.   EKG:  EKG is  ordered today.  The ekg ordered today demonstrates NSR 90bpm, IVCD, atrial enlargement, no significant changes  Recent Labs: 08/29/2021: ALT 18 11/20/2021: B Natriuretic Peptide 580.8 11/27/2021: Magnesium 2.0 12/03/2021: Hemoglobin 12.6; Platelets 229 12/06/2021: BUN 37; Creatinine, Ser 2.04; Potassium 3.6; Sodium  141  Recent Lipid Panel    Component Value Date/Time   CHOL 117 11/28/2021 1324   TRIG 82 11/28/2021 1324   HDL 50 11/28/2021 1324   CHOLHDL 2.3 11/28/2021 1324   VLDL 16 11/28/2021 1324   LDLCALC 51 11/28/2021 1324    Physical Exam:    VS:  BP 137/84   Pulse 89   Ht 5\' 6"  (1.676 m)   Wt 267 lb 6.4 oz (121.3 kg)   SpO2 99%   BMI 43.16 kg/m     Wt Readings from Last 3 Encounters:  12/11/21 267 lb 6.4 oz (121.3 kg)  12/06/21 262 lb 12.8 oz (119.2 kg)  08/29/21 229 lb 4.5 oz (104 kg)     GEN:  Well nourished, well developed in no acute distress HEENT: Normal NECK: No JVD; No carotid bruits LYMPHATICS: No lymphadenopathy CARDIAC: RRR, no murmurs, rubs, gallops RESPIRATORY:  diminished breath sounds ABDOMEN: Soft, non-tender, non-distended MUSCULOSKELETAL:  No edema; No deformity  SKIN: Warm and dry NEUROLOGIC:  Alert and oriented x 3 PSYCHIATRIC:  Normal affect   ASSESSMENT:    1. Pulmonary hypertension, unspecified ([place])   2. Chronic diastolic heart failure due to valvular disease ([place])   3. Essential hypertension   4. Class 3 severe obesity due to excess calories with body mass index (BMI) of 40.0 to 44.9 in adult, unspecified whether serious comorbidity present ([place])   5. OSA (obstructive sleep apnea)    PLAN:    In order of problems listed above:  HFpEF with severe pulmonary HTN H/o CM EF 25-30% in 2016>>LVEF 55-60% Patient appears relatively euvolemic on exam. Torsemide was recently restarted 20mg  daily by PCP. She reports baseline weight around 263lbs, today it's 267lbs. Echo during admission showed LVEF 55-60%, no WMA, mild LVH, normal RVSF, moderately dilated LA. RHC showed severe pulmonary hypertension with possible RV dysfunction. Cath site is stable. She was started on sildenafil in the hospital, but it was held for hypotension I will refer her to pulmonology to follow pulmonary HTN. Suspect pulmonary HTN from untreated OSA, OHS, and CHF.  Continue Coreg.  CKD is limiting GDMT. If kidney function remains stable at follow-up consider GDMT at follow-up. No labs today as PCP has been following kidney function.  HTN BP is mildly elevated today. We will continue GDMT at follow-up.  CKD stge 3 Scr baseline around  1.8, most recent Scr 2.04/BUN 37. Torsemide was recently restarted at 20mg   daily. PCP will check labs in 2 days and patient sees Clarisa Kindred next week. She was also referred to nephrology.   Obesity Weight loss recommended.   OSA Not on CPAP. She has sleep study scheduled for August.   Disposition: Follow up in 6-8 week(s) with MD/APP     Signed, Raley Novicki David Stall, PA-C  12/12/2021 1:05 PM    [facility]

## 2021-12-12 ENCOUNTER — Ambulatory Visit: Admission: RE | Admit: 2021-12-12 | Payer: Medicare Other | Source: Home / Self Care | Admitting: Gastroenterology

## 2021-12-12 ENCOUNTER — Encounter: Payer: Self-pay | Admitting: Medical

## 2021-12-12 ENCOUNTER — Encounter: Admission: RE | Payer: Self-pay | Source: Home / Self Care

## 2021-12-12 SURGERY — COLONOSCOPY WITH PROPOFOL
Anesthesia: General

## 2021-12-19 ENCOUNTER — Ambulatory Visit: Payer: Medicare Other | Admitting: Family

## 2021-12-25 ENCOUNTER — Ambulatory Visit: Payer: Medicare Other | Attending: Family | Admitting: Family

## 2021-12-25 ENCOUNTER — Encounter: Payer: Self-pay | Admitting: Family

## 2021-12-25 VITALS — BP 137/71 | HR 88 | Resp 16 | Ht 66.0 in | Wt 262.1 lb

## 2021-12-25 DIAGNOSIS — I10 Essential (primary) hypertension: Secondary | ICD-10-CM | POA: Diagnosis not present

## 2021-12-25 DIAGNOSIS — K219 Gastro-esophageal reflux disease without esophagitis: Secondary | ICD-10-CM | POA: Insufficient documentation

## 2021-12-25 DIAGNOSIS — I38 Endocarditis, valve unspecified: Secondary | ICD-10-CM

## 2021-12-25 DIAGNOSIS — I5032 Chronic diastolic (congestive) heart failure: Secondary | ICD-10-CM | POA: Diagnosis present

## 2021-12-25 DIAGNOSIS — I272 Pulmonary hypertension, unspecified: Secondary | ICD-10-CM | POA: Diagnosis not present

## 2021-12-25 DIAGNOSIS — I130 Hypertensive heart and chronic kidney disease with heart failure and stage 1 through stage 4 chronic kidney disease, or unspecified chronic kidney disease: Secondary | ICD-10-CM | POA: Insufficient documentation

## 2021-12-25 DIAGNOSIS — G40219 Localization-related (focal) (partial) symptomatic epilepsy and epileptic syndromes with complex partial seizures, intractable, without status epilepticus: Secondary | ICD-10-CM | POA: Diagnosis not present

## 2021-12-25 DIAGNOSIS — R569 Unspecified convulsions: Secondary | ICD-10-CM | POA: Diagnosis not present

## 2021-12-25 LAB — BASIC METABOLIC PANEL
Anion gap: 8 (ref 5–15)
BUN: 38 mg/dL — ABNORMAL HIGH (ref 6–20)
CO2: 31 mmol/L (ref 22–32)
Calcium: 9.4 mg/dL (ref 8.9–10.3)
Chloride: 102 mmol/L (ref 98–111)
Creatinine, Ser: 1.83 mg/dL — ABNORMAL HIGH (ref 0.44–1.00)
GFR, Estimated: 33 mL/min — ABNORMAL LOW (ref >60)
Glucose, Bld: 84 mg/dL (ref 70–99)
Potassium: 3.8 mmol/L (ref 3.5–5.1)
Sodium: 141 mmol/L (ref 135–145)

## 2021-12-25 NOTE — Patient Instructions (Addendum)
Continue weighing daily and call for an overnight weight gain of 3 pounds or more or a weekly weight gain of more than 5 pounds.  Continue to eat healthy on low sodium and low cholesterol diet.   If you have voicemail, please make sure your mailbox is cleaned out so that we may leave a message and please make sure to listen to any voicemails.

## 2021-12-25 NOTE — Progress Notes (Signed)
Patient ID: Sheila Hays, female    DOB: [birth year], 51 y.o.   MRN: [id number]  HPI  Sheila Hays is a 51 y/o female with a history of HTN, CKD, GERD, seizures and chronic heart failure.   Echo report from 11/21/21 reviewed and showed an EF of 55-60% along with mild LVH and mild/moderate LAE. Echo report from 11/30/20 reviewed and showed an EF of 55-60% without LVH.   RHC done 11/25/21 and showed: Hemodynamic findings consistent with severe pulmonary hypertension.  Successful right heart catheterization via the right antecubital vein. This showed evidence of severely elevated right and left-sided filling pressures, severe pulmonary hypertension and normal cardiac output.  RA: 22 mmHg RV: 92/18 with an end-diastolic pressure of 32 mmHg. PA: 95/44 with a mean of 64 mmHg PCW: 30 mmHg  Cardiac output: 7.4 with an index of 3.16. Pulmonary vascular resistance: 4.5 Woods units  Admitted 11/20/21 due to acute on chronic heart failure. Initially given IV lasix, then lasix drip with transition to oral diuretics. Cardiology consult obtained. Given IV vasotec due to HTN. RHC completed. Milrinone drip needed for a few days. Discharged after 16 days.   She presents today for a follow-up visit although hasn't been seen since July 2022. She presents  with no complaints. Patient is walking at night around parking lot and eating very healthy. She has lost 5 pounds since last visit and is actively trying to lose weight. She drinks mostly water and unsweetened pineapple juice. Denies headaches, dizziness, SOB, chest pain/pressure, experiences occasional palpitations, denies abdominal pain, constipation nor diarrhea. Denies swelling in lower extremities. Denies smoking tobacco or alcohol use.   Past Medical History:  Diagnosis Date   CHF (congestive heart failure) (HCC)    Chronic kidney disease (CKD), stage III (moderate) (HCC)    GERD (gastroesophageal reflux disease)    Hypertension    Seizures ([place])     Followed at [facility]   Past Surgical History:  Procedure Laterality Date   RIGHT HEART CATH N/A 11/25/2021   Procedure: RIGHT HEART CATH;  Surgeon: Wellington Hampshire, MD;  Location: [place] [facility];  Service: Cardiovascular;  Laterality: N/A;   Family History  Problem Relation Age of Onset   Hypertension Mother    Hypertension Father    Heart failure Sister    Diabetes Sister    Seizures Sister    Social History   Tobacco Use   Smoking status: Former   Smokeless tobacco: Former    Quit date: 03/06/2017  Substance Use Topics   Alcohol use: Not Currently   Allergies  Allergen Reactions   Ciprofloxacin Itching   Prior to Admission medications   Medication Sig Start Date End Date Taking? Authorizing Provider  acetaminophen (TYLENOL) 500 MG tablet Take 500 mg by mouth every 6 (six) hours as needed.   Yes [provider]  ADVAIR DISKUS 250-50 MCG/ACT AEPB Inhale 1 puff into the lungs 2 (two) times daily. 11/18/21  Yes [provider]  albuterol (VENTOLIN HFA) 108 (90 Base) MCG/ACT inhaler Inhale 2 puffs into the lungs every 4 (four) hours as needed. 10/16/20  Yes [provider]  aspirin EC 81 MG tablet Take 81 mg by mouth daily.   Yes [provider]  calcium carbonate (TUMS - DOSED IN MG ELEMENTAL CALCIUM) 500 MG chewable tablet Chew 1 tablet (200 mg of elemental calcium total) by mouth 3 (three) times daily as needed for indigestion or heartburn. 12/06/21  Yes Emeterio Reeve, DO  carvedilol (  COREG) 3.125 MG tablet Take 1 tablet (3.125 mg total) by mouth 2 (two) times daily with a meal. 12/06/21  Yes Sunnie Nielsen, DO  folic acid (FOLVITE) 1 MG tablet Take 0.5 tablets (0.5 mg total) by mouth daily. 12/07/21  Yes Sunnie Nielsen, DO  ipratropium-albuterol (DUONEB) 0.5-2.5 (3) MG/3ML SOLN Take 3 mLs by nebulization every 6 (six) hours as needed. 11/20/21  Yes [provider]  LINZESS 145 MCG CAPS capsule Take 145 mcg by mouth daily as  needed. 10/08/21  Yes [provider]  montelukast (SINGULAIR) 10 MG tablet Take 10 mg by mouth daily. 11/06/21  Yes [provider]  omeprazole (PRILOSEC) 20 MG capsule Take 20 mg by mouth daily.   Yes [provider]  oxcarbazepine (TRILEPTAL) 600 MG tablet Take 600 mg by mouth 2 (two) times daily.   Yes [provider]  rosuvastatin (CRESTOR) 20 MG tablet Take 20 mg by mouth at bedtime. 12/10/21  Yes [provider]  torsemide (DEMADEX) 20 MG tablet Take 20 mg by mouth 2 (two) times daily. 12/10/21  Yes [provider]  traZODone (DESYREL) 100 MG tablet Take 100 mg by mouth at bedtime as needed for sleep.   Yes [provider]  vitamin C (ASCORBIC ACID) 500 MG tablet Take 500 mg by mouth daily.   Yes [provider]  atorvastatin (LIPITOR) 40 MG tablet Take 1 tablet (40 mg total) by mouth daily at 6 PM. Patient not taking: Reported on 12/25/2021 03/06/18   Laverda Page B, NP  lamoTRIgine (LAMICTAL) 25 MG tablet Take 25 mg by mouth 2 (two) times daily. Patient not taking: Reported on 12/25/2021 10/08/21   [provider]   Review of Systems  Constitutional:  Negative for appetite change and fatigue.  HENT:  Negative for congestion, postnasal drip and sore throat.   Eyes: Negative.   Respiratory:  Negative for cough, chest tightness and shortness of breath.        + snoring  Cardiovascular:  Negative for chest pain, palpitations and leg swelling.  Gastrointestinal:  Negative for abdominal distention and abdominal pain.  Endocrine: Negative.   Genitourinary: Negative.   Musculoskeletal:  Negative for back pain and neck pain.  Skin: Negative.   Allergic/Immunologic: Negative.   Neurological:  Negative for dizziness and light-headedness.  Hematological:  Negative for adenopathy. Does not bruise/bleed easily.  Psychiatric/Behavioral:  Negative for dysphoric mood and sleep disturbance (sleeping on 3 pillows). The  patient is not nervous/anxious.     Vitals:   12/25/21 1135  BP: 137/71  Pulse: 88  Resp: 16  SpO2: 100%  Weight: 262 lb 2 oz (118.9 kg)  Height: 5\' 6"  (1.676 m)   Wt Readings from Last 3 Encounters:  12/25/21 262 lb 2 oz (118.9 kg)  12/11/21 267 lb 6.4 oz (121.3 kg)  12/06/21 262 lb 12.8 oz (119.2 kg)   Lab Results  Component Value Date   CREATININE 2.04 (H) 12/06/2021   CREATININE 1.81 (H) 12/05/2021   CREATININE 1.65 (H) 12/04/2021    Physical Exam Vitals and nursing note reviewed.  Constitutional:      Appearance: Normal appearance.  HENT:     Head: Normocephalic and atraumatic.  Cardiovascular:     Rate and Rhythm: Normal rate and regular rhythm.  Pulmonary:     Effort: Pulmonary effort is normal. No respiratory distress.     Breath sounds: Normal breath sounds. No wheezing or rales.  Abdominal:     General: There is no  distension.     Palpations: Abdomen is soft.  Musculoskeletal:        General: No tenderness.     Cervical back: Normal range of motion and neck supple.     Right lower leg: No edema.     Left lower leg: No edema.  Skin:    General: Skin is warm and dry.  Neurological:     General: No focal deficit present.     Mental Status: She is alert and oriented to person, place, and time.  Psychiatric:        Mood and Affect: Mood normal.        Behavior: Behavior normal.        Thought Content: Thought content normal.      Assessment & Plan:  1: Chronic heart failure with preserved ejection fraction with structural changes (LVH/LAE)- - NYHA class I - euvolemic today - weighing daily; reminded to call for an overnight weight gain of > 2 pounds or a weekly weight gain of > 5 pounds - not adding salt; reviewed the importance of reading food labels to keep her daily sodium intake to 2000mg  / day - drinking 60-90 ounces of fluid daily; advised to keep it closer to the 60-64 ounces/ day - saw cardiology Fransico Michael) 12/11/21 - on GDMT of  - BNP  11/20/21 was 580.8  2: HTN- - BP looks good (137/71) - seeing PCP at [facility]  - BMP 12/06/21 reviewed and showed sodium 141, potassium 3.6, creatinine 2.04 and GFR 29 - will recheck BMP today - saw nephrology Cassie Freer) 01/21/21  3: Seizures- - saw neurology Marlene Bast) 12/13/20    Medication bottles reviewed.   Follow-up 2 months.

## 2022-01-06 ENCOUNTER — Ambulatory Visit: Payer: Medicare Other

## 2022-01-08 ENCOUNTER — Other Ambulatory Visit (HOSPITAL_BASED_OUTPATIENT_CLINIC_OR_DEPARTMENT_OTHER): Payer: Self-pay | Admitting: Osteopathic Medicine

## 2022-01-23 ENCOUNTER — Ambulatory Visit: Payer: Medicare Other | Attending: Neurology

## 2022-01-30 ENCOUNTER — Encounter: Payer: Self-pay | Admitting: Medical

## 2022-01-30 ENCOUNTER — Ambulatory Visit: Payer: Medicare Other | Attending: Medical | Admitting: Medical

## 2022-01-30 VITALS — BP 130/82 | HR 99 | Ht 64.0 in | Wt 260.0 lb

## 2022-01-30 DIAGNOSIS — N1831 Chronic kidney disease, stage 3a: Secondary | ICD-10-CM

## 2022-01-30 DIAGNOSIS — G4733 Obstructive sleep apnea (adult) (pediatric): Secondary | ICD-10-CM

## 2022-01-30 DIAGNOSIS — I272 Pulmonary hypertension, unspecified: Secondary | ICD-10-CM

## 2022-01-30 DIAGNOSIS — I10 Essential (primary) hypertension: Secondary | ICD-10-CM

## 2022-01-30 DIAGNOSIS — Z6841 Body Mass Index (BMI) 40.0 and over, adult: Secondary | ICD-10-CM

## 2022-01-30 DIAGNOSIS — I5032 Chronic diastolic (congestive) heart failure: Secondary | ICD-10-CM | POA: Diagnosis not present

## 2022-01-30 NOTE — Patient Instructions (Signed)
Medication Instructions:   Your physician recommends that you continue on your current medications as directed. Please refer to the Current Medication list given to you today.  *If you need a refill on your cardiac medications before your next appointment, please call your pharmacy*   Lab Work:  None Ordered  If you have labs (blood work) drawn today and your tests are completely normal, you will receive your results only by: MyChart Message (if you have MyChart) OR A paper copy in the mail If you have any lab test that is abnormal or we need to change your treatment, we will call you to review the results.   Testing/Procedures:  None Ordered   Follow-Up: At [facility], you and your health needs are our priority.  As part of our continuing mission to provide you with exceptional heart care, we have created designated Provider Care Teams.  These Care Teams include your primary Cardiologist (physician) and Advanced Practice Providers (APPs -  Physician Assistants and Nurse Practitioners) who all work together to provide you with the care you need, when you need it.  We recommend signing up for the patient portal called "MyChart".  Sign up information is provided on this After Visit Summary.  MyChart is used to connect with patients for Virtual Visits (Telemedicine).  Patients are able to view lab/test results, encounter notes, upcoming appointments, etc.  Non-urgent messages can be sent to your provider as well.   To learn more about what you can do with MyChart, go to [email or web address].    Your next appointment:   2 month(s)  The format for your next appointment:   In Person  Provider:   You may see Peter [place], MD or one of the following Advanced Practice Providers on your designated Care Team:   Nicolasa Ducking, NP Eula Listen, PA-C Cadence Fransico Michael, [place] Charlsie Quest, NP   Other Instructions  You have been referred to nephrology - they will call you with  an appointment.    Important Information About Sugar

## 2022-01-30 NOTE — Progress Notes (Signed)
Cardiology Office Note:    Date:  01/31/2022   ID:  Tabor, Bartram 1970-05-29, MRN [id number]  PCP:  [facility]  [facility] Cardiologist:  Peter Swaziland, MD  [facility] Electrophysiologist:  None   Referring MD: [facility] Service*   Chief Complaint: 2 month follow-up  History of Present Illness:    Sheila Hays is a 51 y.o. female with a hx of HFimpEF, CKD stage 3, seizure disorder, uncontrolled HTN, and GERD who is being seen for hospital follow-up.    Sheila Hays previously underwent nuclear stress test in 2016 that was abnormal, notable for perfusion defect of the lateral inferior segment with an EF of 35%. Echo at that time demonstrated an EF of 25-30%, mildly to moderately dilated LV cavity size, mild to moderate concentric LVH, mild to moderate MR, mild TR, PASP 55-60 mmHg, and a trivial pericardial effusion. Repeat echo in 2018 demonstrated a low normal LVSF with an EF of 50-55%, normal wall motion.    We previously evaluated her at [facility] in [place] for chest pain following transfer from [facility] Nuclear stress test at that time showed a small defect of mild severity in the basal anteroseptal and mid anteroseptal location consistent with ischemia vs breast attenuation artifact with an EF of 45-54%. Overall, this was a low risk study.    Echo during admission in 11/2020 for volume overload in the setting of poorly controlled hypertension showed an EF of 55-60%, no RWMA, normal LV diastolic function parameters, normal RV systolic function and ventricular cavity size, and trivial mitral regurgitation.   She was admitted 6/28-7/14 for shortness of breath and chest pain. BNP ws 500s. CXR with b/l pleural effusions and concern for sepsis. She was admitted for diuresis with IV lasix and hypertensive urgency. RHC 11/25/21 showed severe pulmonary HTN. She had a fall in the bathroom with negative work-up. She was moved to the ICU for  milrinone drip and PICC line. Sildenafil and BB were held for hypotension. Required norepinephrine. She was eventually transitioned to torsmide 40mg  daily.    Last seen 12/11/21 and was doing well. She denied chest pain, SOB, LLE, orthopnea or pnd. She noted increasing weights and was started back on torsemide 20mg  daily.   Today, the patietn reports she has been taking Torsemide 20mg  BID instead of 20mg  daily for the last month. She has no SOB, LLE. No chest pain. Weight has gone down about 7 lbs in the last 2 months. I think she needs repeat BMET, she says PCP can do this tomorrow.   Past Medical History:  Diagnosis Date   CHF (congestive heart failure) (HCC)    Chronic kidney disease (CKD), stage III (moderate) (HCC)    GERD (gastroesophageal reflux disease)    Hypertension    Seizures (HCC)    Followed at [facility]    Past Surgical History:  Procedure Laterality Date   RIGHT HEART CATH N/A 11/25/2021   Procedure: RIGHT HEART CATH;  Surgeon: , MD;  Location: [facility] INVASIVE CV LAB;  Service: Cardiovascular;  Laterality: N/A;    Current Medications: Current Meds  Medication Sig   acetaminophen (TYLENOL) 500 MG tablet Take 500 mg by mouth every 6 (six) hours as needed.   ADVAIR DISKUS 250-50 MCG/ACT AEPB Inhale 1 puff into the lungs 2 (two) times daily.   albuterol (VENTOLIN HFA) 108 (90 Base) MCG/ACT inhaler Inhale 2 puffs into the lungs every 4 (four) hours as needed.  aspirin EC 81 MG tablet Take 81 mg by mouth daily.   atorvastatin (LIPITOR) 40 MG tablet Take 1 tablet (40 mg total) by mouth daily at 6 PM.   calcium carbonate (TUMS - DOSED IN MG ELEMENTAL CALCIUM) 500 MG chewable tablet Chew 1 tablet (200 mg of elemental calcium total) by mouth 3 (three) times daily as needed for indigestion or heartburn.   carvedilol (COREG) 3.125 MG tablet Take 1 tablet (3.125 mg total) by mouth 2 (two) times daily with a meal.   folic acid (FOLVITE) 1 MG tablet Take 0.5 tablets (0.5 mg  total) by mouth daily.   ipratropium-albuterol (DUONEB) 0.5-2.5 (3) MG/3ML SOLN Take 3 mLs by nebulization every 6 (six) hours as needed.   lamoTRIgine (LAMICTAL) 25 MG tablet Take 25 mg by mouth 2 (two) times daily.   LINZESS 145 MCG CAPS capsule Take 145 mcg by mouth daily as needed.   montelukast (SINGULAIR) 10 MG tablet Take 10 mg by mouth daily.   omeprazole (PRILOSEC) 20 MG capsule Take 20 mg by mouth daily.   oxcarbazepine (TRILEPTAL) 600 MG tablet Take 600 mg by mouth 2 (two) times daily.   rosuvastatin (CRESTOR) 20 MG tablet Take 20 mg by mouth at bedtime.   torsemide (DEMADEX) 20 MG tablet Take 20 mg by mouth 2 (two) times daily.   traZODone (DESYREL) 100 MG tablet Take 100 mg by mouth at bedtime as needed for sleep.   vitamin C (ASCORBIC ACID) 500 MG tablet Take 500 mg by mouth daily.     Allergies:   Ciprofloxacin   Social History   Socioeconomic History   Marital status: Single    Spouse name: Not on file   Number of children: Not on file   Years of education: Not on file   Highest education level: Not on file  Occupational History   Not on file  Tobacco Use   Smoking status: Former   Smokeless tobacco: Former    Quit date: 03/06/2017  Vaping Use   Vaping Use: Never used  Substance and Sexual Activity   Alcohol use: Not Currently   Drug use: Never   Sexual activity: Not Currently  Other Topics Concern   Not on file  Social History Narrative   Not on file   Social Determinants of Health   Financial Resource Strain: Not on file  Food Insecurity: Not on file  Transportation Needs: Not on file  Physical Activity: Not on file  Stress: Not on file  Social Connections: Not on file     Family History: The patient's family history includes Diabetes in her sister; Heart failure in her sister; Hypertension in her father and mother; Seizures in her sister.  ROS:   Please see the history of present illness.     All other systems reviewed and are  negative.  EKGs/Labs/Other Studies Reviewed:    The following studies were reviewed today:  Cardiac cath 11/25/21   Hemodynamic findings consistent with severe pulmonary hypertension.   Successful right heart catheterization via the right antecubital vein. This showed evidence of severely elevated right and left-sided filling pressures, severe pulmonary hypertension and normal cardiac output.   RA: 22 mmHg RV: 92/18 with an end-diastolic pressure of 32 mmHg. PA: 95/44 with a mean of 64 mmHg PCW: 30 mmHg  Cardiac output: 7.4 with an index of 3.16. Pulmonary vascular resistance: 4.5 Woods units   Recommendations: The patient is significantly volume overloaded. Pulmonary hypertension is severe and seems to be of a  mixed venous and arterial etiology. We will start the patient on furosemide drip and the dose should be uptitrated based on urine output response.  Diuresis might be difficult due to the presence of severe pulmonary hypertension and likely RV dysfunction.  If renal function worsens with diuresis, she might require inotropic therapy for RV support and consideration to transfer to an advanced heart failure service.     Echo 10/2021  1. Left ventricular ejection fraction, by estimation, is 55 to 60%. The  left ventricle has normal function. The left ventricle has no regional  wall motion abnormalities. The left ventricular internal cavity size was  mildly dilated. There is mild left  ventricular hypertrophy. Left ventricular diastolic function could not be  evaluated.   2. Right ventricular systolic function is normal. The right ventricular  size is not well visualized.   3. Left atrial size was mild to moderately dilated.   4. The mitral valve is normal in structure. No evidence of mitral valve  regurgitation.   5. The aortic valve was not well visualized. Aortic valve regurgitation  is not visualized.   6. The inferior vena cava is normal in size with <50% respiratory   variability, suggesting right atrial pressure of 8 mmHg.   EKG:  EKG is not ordered today.   Recent Labs: 08/29/2021: ALT 18 11/20/2021: B Natriuretic Peptide 580.8 11/27/2021: Magnesium 2.0 12/03/2021: Hemoglobin 12.6; Platelets 229 12/25/2021: BUN 38; Creatinine, Ser 1.83; Potassium 3.8; Sodium 141  Recent Lipid Panel    Component Value Date/Time   CHOL 117 11/28/2021 1324   TRIG 82 11/28/2021 1324   HDL 50 11/28/2021 1324   CHOLHDL 2.3 11/28/2021 1324   VLDL 16 11/28/2021 1324   LDLCALC 51 11/28/2021 1324     Physical Exam:    VS:  BP 130/82 (BP Location: Left Arm, Patient Position: Sitting, Cuff Size: Large)   Pulse 99   Ht 5\' 4"  (1.626 m)   Wt 260 lb (117.9 kg)   SpO2 99%   BMI 44.63 kg/m     Wt Readings from Last 3 Encounters:  01/30/22 260 lb (117.9 kg)  12/25/21 262 lb 2 oz (118.9 kg)  12/11/21 267 lb 6.4 oz (121.3 kg)     GEN:  Well nourished, well developed in no acute distress HEENT: Normal NECK: No JVD; No carotid bruits LYMPHATICS: No lymphadenopathy CARDIAC: RRR, no murmurs, rubs, gallops RESPIRATORY:  Clear to auscultation without rales, wheezing or rhonchi  ABDOMEN: Soft, non-tender, non-distended MUSCULOSKELETAL:  No edema; No deformity  SKIN: Warm and dry NEUROLOGIC:  Alert and oriented x 3 PSYCHIATRIC:  Normal affect   ASSESSMENT:    1. Chronic diastolic heart failure (HCC)   2. Chronic kidney disease, stage 3a (HCC)   3. Pulmonary hypertension, unspecified (HCC)   4. Essential hypertension   5. Class 3 severe obesity due to excess calories with body mass index (BMI) of 40.0 to 44.9 in adult, unspecified whether serious comorbidity present (HCC)   6. OSA (obstructive sleep apnea)    PLAN:    In order of problems listed above:  HFpEF with severe pulmonary HTN H/o CM 25-30% in 2016>>LVEF 55-60% Patient self-increased the Torsemide from 20mg  daily to 20mg  BID about a month ago. She will need repeat BMET. She appears euvolemic on exam. Patient  says PCP will check labs tomorrow, recommend BMET. She also seen 12/13/21 in a month. Goal to continue GDMT if kidney function remains stable. Continue Coreg.   HTN BP is  good today, continue current medications.   CKD stage 3 We will refer again to nephrology.   Obesity Weight loss recommended.   OSA She did not do the sleep study, unsure if she will follow-up with this.   Disposition: Follow up in 2 month(s) with MD/APP     Signed, Gaither Biehn Ninfa Meeker, PA-C  01/31/2022 10:01 AM    [facility]

## 2022-01-31 ENCOUNTER — Encounter: Payer: Self-pay | Admitting: Medical

## 2022-02-03 ENCOUNTER — Encounter: Payer: Self-pay | Admitting: Medical

## 2022-02-13 ENCOUNTER — Institutional Professional Consult (permissible substitution): Payer: Medicare Other | Admitting: Internal Medicine

## 2022-02-25 ENCOUNTER — Other Ambulatory Visit: Payer: Self-pay | Admitting: Internal Medicine

## 2022-02-25 DIAGNOSIS — Z1231 Encounter for screening mammogram for malignant neoplasm of breast: Secondary | ICD-10-CM

## 2022-03-04 ENCOUNTER — Ambulatory Visit: Payer: Medicare Other | Admitting: Family

## 2022-03-07 ENCOUNTER — Ambulatory Visit: Payer: Medicare Other | Admitting: Family

## 2022-03-07 ENCOUNTER — Ambulatory Visit
Admission: RE | Admit: 2022-03-07 | Discharge: 2022-03-07 | Disposition: A | Discharge status: Home / Self Care | Payer: Medicare Other | Source: Ambulatory Visit | Attending: Internal Medicine | Admitting: Internal Medicine

## 2022-03-07 DIAGNOSIS — Z1231 Encounter for screening mammogram for malignant neoplasm of breast: Secondary | ICD-10-CM

## 2022-03-14 ENCOUNTER — Ambulatory Visit: Payer: Medicare Other | Admitting: Family

## 2022-03-16 NOTE — Progress Notes (Deleted)
Patient ID: Sheila Hays, female    DOB: Oct 22, 1970, 51 y.o.   MRN: [id number]  HPI  Sheila Hays is a 51 y/o female with a history of HTN, CKD, GERD, seizures and chronic heart failure.   Echo report from 11/21/21 reviewed and showed an EF of 55-60% along with mild LVH and mild/moderate LAE. Echo report from 11/30/20 reviewed and showed an EF of 55-60% without LVH.   RHC done 11/25/21 and showed: Hemodynamic findings consistent with severe pulmonary hypertension.  Successful right heart catheterization via the right antecubital vein. This showed evidence of severely elevated right and left-sided filling pressures, severe pulmonary hypertension and normal cardiac output.  RA: 22 mmHg RV: 92/18 with an end-diastolic pressure of 32 mmHg. PA: 95/44 with a mean of 64 mmHg PCW: 30 mmHg  Cardiac output: 7.4 with an index of 3.16. Pulmonary vascular resistance: 4.5 Woods units  Admitted 11/20/21 due to acute on chronic heart failure. Initially given IV lasix, then lasix drip with transition to oral diuretics. Cardiology consult obtained. Given IV vasotec due to HTN. RHC completed. Milrinone drip needed for a few days. Discharged after 16 days.   She presents today for a follow-up with a chief complaint of   Past Medical History:  Diagnosis Date   CHF (congestive heart failure) ([place])    Chronic kidney disease (CKD), stage III (moderate) (HCC)    GERD (gastroesophageal reflux disease)    Hypertension    Seizures ([place])    Followed at [facility]   Past Surgical History:  Procedure Laterality Date   RIGHT HEART CATH N/A 11/25/2021   Procedure: RIGHT HEART CATH;  Surgeon: Wellington Hampshire, MD;  Location: [place] CV LAB;  Service: Cardiovascular;  Laterality: N/A;   Family History  Problem Relation Age of Onset   Hypertension Mother    Hypertension Father    Heart failure Sister    Diabetes Sister    Seizures Sister    Breast cancer Neg Hx    Social History   Tobacco Use   Smoking  status: Former   Smokeless tobacco: Former    Quit date: 03/06/2017  Substance Use Topics   Alcohol use: Not Currently   Allergies  Allergen Reactions   Ciprofloxacin Itching    Review of Systems  Constitutional:  Negative for appetite change and fatigue.  HENT:  Negative for congestion, postnasal drip and sore throat.   Eyes: Negative.   Respiratory:  Negative for cough, chest tightness and shortness of breath.        + snoring  Cardiovascular:  Negative for chest pain, palpitations and leg swelling.  Gastrointestinal:  Negative for abdominal distention and abdominal pain.  Endocrine: Negative.   Genitourinary: Negative.   Musculoskeletal:  Negative for back pain and neck pain.  Skin: Negative.   Allergic/Immunologic: Negative.   Neurological:  Negative for dizziness and light-headedness.  Hematological:  Negative for adenopathy. Does not bruise/bleed easily.  Psychiatric/Behavioral:  Negative for dysphoric mood and sleep disturbance (sleeping on 3 pillows). The patient is not nervous/anxious.       Physical Exam Vitals and nursing note reviewed.  Constitutional:      Appearance: Normal appearance.  HENT:     Head: Normocephalic and atraumatic.  Cardiovascular:     Rate and Rhythm: Normal rate and regular rhythm.  Pulmonary:     Effort: Pulmonary effort is normal. No respiratory distress.     Breath sounds: Normal breath sounds. No wheezing or rales.  Abdominal:  General: There is no distension.     Palpations: Abdomen is soft.  Musculoskeletal:        General: No tenderness.     Cervical back: Normal range of motion and neck supple.     Right lower leg: No edema.     Left lower leg: No edema.  Skin:    General: Skin is warm and dry.  Neurological:     General: No focal deficit present.     Mental Status: She is alert and oriented to person, place, and time.  Psychiatric:        Mood and Affect: Mood normal.        Behavior: Behavior normal.         Thought Content: Thought content normal.      Assessment & Plan:  1: Chronic heart failure with preserved ejection fraction with structural changes (LVH/LAE)- - NYHA class I - euvolemic today - weighing daily; reminded to call for an overnight weight gain of > 2 pounds or a weekly weight gain of > 5 pounds - not adding salt; reviewed the importance of reading food labels to keep her daily sodium intake to 2000mg  / day - drinking 60-90 ounces of fluid daily; advised to keep it closer to the 60-64 ounces/ day - saw cardiology Kathlen Mody) 01/30/22 - on GDMT of  - BNP 11/20/21 was 580.8  2: HTN- - BP  - seeing PCP at [facility] 12/25/21 reviewed and showed sodium 141, potassium 3.8, creatinine 1.83 and GFR 33 - saw nephrology Karl Pock) 01/21/21  3: Seizures- - saw neurology Cornelia Copa) 12/13/20    Medication bottles reviewed.

## 2022-03-17 ENCOUNTER — Ambulatory Visit: Payer: Medicare Other | Admitting: Family

## 2022-03-17 ENCOUNTER — Telehealth: Payer: Self-pay | Admitting: Family

## 2022-03-17 NOTE — Telephone Encounter (Signed)
Patient did not show for her Heart Failure Clinic appointment on 03/17/22. Will attempt to reschedule.

## 2022-04-04 ENCOUNTER — Encounter: Payer: Self-pay | Admitting: Medical

## 2022-04-04 ENCOUNTER — Ambulatory Visit: Payer: Medicare Other | Attending: Medical | Admitting: Medical

## 2022-04-04 NOTE — Progress Notes (Deleted)
Cardiology Office Note:    Date:  04/04/2022   ID:  Sheila Hays, Sheila Hays 21-Jun-1970, MRN [id number]  PCP:  [facility] Cardiologist:  Peter Martinique, MD  [place] Electrophysiologist:  None   Referring MD: [facility] Service*   Chief Complaint: 2 month follow-up  History of Present Illness:    Kiwanna Bouska is a 51 y.o. female with a hx of HFimpEF, CKD stage 3, seizure disorder, uncontrolled HTN, and GERD who is being seen for 2 month follow-up.    Ms. Stute previously underwent nuclear stress test in 2016 that was abnormal, notable for perfusion defect of the lateral inferior segment with an EF of 35%. Echo at that time demonstrated an EF of 25-30%, mildly to moderately dilated LV cavity size, mild to moderate concentric LVH, mild to moderate MR, mild TR, PASP 55-60 mmHg, and a trivial pericardial effusion. Repeat echo in 2018 demonstrated a low normal LVSF with an EF of 50-55%, normal wall motion.    We previously evaluated her at [facility] in [place] for chest pain following transfer from [facility]. Nuclear stress test at that time showed a small defect of mild severity in the basal anteroseptal and mid anteroseptal location consistent with ischemia vs breast attenuation artifact with an EF of 45-54%. Overall, this was a low risk study.    Echo during admission in 11/2020 for volume overload in the setting of poorly controlled hypertension showed an EF of 55-60%, no RWMA, normal LV diastolic function parameters, normal RV systolic function and ventricular cavity size, and trivial mitral regurgitation.   She was admitted 6/28-7/14 for shortness of breath and chest pain. BNP ws 500s. CXR with b/l pleural effusions and concern for sepsis. She was admitted for diuresis with IV lasix and hypertensive urgency. [place] 11/25/21 showed severe pulmonary HTN. She had a fall in the bathroom with negative work-up. She was moved to the ICU for  milrinone drip and PICC line. Sildenafil and BB were held for hypotension. Required norepinephrine. She was eventually transitioned to torsmide 40mg  daily.    She was seen 12/11/21 and was doing well. She denied chest pain, SOB, LLE, orthopnea or pnd. She noted increasing weights and was started back on torsemide 20mg  daily.   Last seen 01/30/22 and was doing well, she was taking torsemide 20mg  BID instead of 20mg  diaiy.   Today,   Past Medical History:  Diagnosis Date   CHF (congestive heart failure) (HCC)    Chronic kidney disease (CKD), stage III (moderate) (HCC)    GERD (gastroesophageal reflux disease)    Hypertension    Seizures ([place])    Followed at [facility]    Past Surgical History:  Procedure Laterality Date   RIGHT HEART CATH N/A 11/25/2021   Procedure: RIGHT HEART CATH;  Surgeon: Wellington Hampshire, MD;  Location: [place] CV LAB;  Service: Cardiovascular;  Laterality: N/A;    Current Medications: No outpatient medications have been marked as taking for the 04/04/22 encounter (Appointment) with Kathlen Mody, Chord Takahashi H, PA-C.     Allergies:   Ciprofloxacin   Social History   Socioeconomic History   Marital status: Single    Spouse name: Not on file   Number of children: Not on file   Years of education: Not on file   Highest education level: Not on file  Occupational History   Not on file  Tobacco Use   Smoking status: Former   Smokeless tobacco: Former  Quit date: 03/06/2017  Vaping Use   Vaping Use: Never used  Substance and Sexual Activity   Alcohol use: Not Currently   Drug use: Never   Sexual activity: Not Currently  Other Topics Concern   Not on file  Social History Narrative   Not on file   Social Determinants of Health   Financial Resource Strain: Not on file  Food Insecurity: Not on file  Transportation Needs: Not on file  Physical Activity: Not on file  Stress: Not on file  Social Connections: Not on file     Family History: The patient's  family history includes Diabetes in her sister; Heart failure in her sister; Hypertension in her father and mother; Seizures in her sister. There is no history of Breast cancer.  ROS:   Please see the history of present illness.     All other systems reviewed and are negative.  EKGs/Labs/Other Studies Reviewed:    The following studies were reviewed today:  Cardiac cath 11/25/21   Hemodynamic findings consistent with severe pulmonary hypertension.   Successful right heart catheterization via the right antecubital vein. This showed evidence of severely elevated right and left-sided filling pressures, severe pulmonary hypertension and normal cardiac output.   RA: 22 mmHg RV: 92/18 with an end-diastolic pressure of 32 mmHg. PA: 95/44 with a mean of 64 mmHg PCW: 30 mmHg  Cardiac output: 7.4 with an index of 3.16. Pulmonary vascular resistance: 4.5 Woods units   Recommendations: The patient is significantly volume overloaded. Pulmonary hypertension is severe and seems to be of a mixed venous and arterial etiology. We will start the patient on furosemide drip and the dose should be uptitrated based on urine output response.  Diuresis might be difficult due to the presence of severe pulmonary hypertension and likely RV dysfunction.  If renal function worsens with diuresis, she might require inotropic therapy for RV support and consideration to transfer to an advanced heart failure service.     Echo 10/2021  1. Left ventricular ejection fraction, by estimation, is 55 to 60%. The  left ventricle has normal function. The left ventricle has no regional  wall motion abnormalities. The left ventricular internal cavity size was  mildly dilated. There is mild left  ventricular hypertrophy. Left ventricular diastolic function could not be  evaluated.   2. Right ventricular systolic function is normal. The right ventricular  size is not well visualized.   3. Left atrial size was mild to moderately  dilated.   4. The mitral valve is normal in structure. No evidence of mitral valve  regurgitation.   5. The aortic valve was not well visualized. Aortic valve regurgitation  is not visualized.   6. The inferior vena cava is normal in size with <50% respiratory  variability, suggesting right atrial pressure of 8 mmHg.   EKG:  EKG is *** ordered today.  The ekg ordered today demonstrates ***  Recent Labs: 08/29/2021: ALT 18 11/20/2021: B Natriuretic Peptide 580.8 11/27/2021: Magnesium 2.0 12/03/2021: Hemoglobin 12.6; Platelets 229 12/25/2021: BUN 38; Creatinine, Ser 1.83; Potassium 3.8; Sodium 141  Recent Lipid Panel    Component Value Date/Time   CHOL 117 11/28/2021 1324   TRIG 82 11/28/2021 1324   HDL 50 11/28/2021 1324   CHOLHDL 2.3 11/28/2021 1324   VLDL 16 11/28/2021 1324   LDLCALC 51 11/28/2021 1324     Risk Assessment/Calculations:   {Does this patient have ATRIAL FIBRILLATION?:[phone]}   Physical Exam:    VS:  There were no  vitals taken for this visit.    Wt Readings from Last 3 Encounters:  01/30/22 260 lb (117.9 kg)  12/25/21 262 lb 2 oz (118.9 kg)  12/11/21 267 lb 6.4 oz (121.3 kg)     GEN: *** Well nourished, well developed in no acute distress HEENT: Normal NECK: No JVD; No carotid bruits LYMPHATICS: No lymphadenopathy CARDIAC: ***RRR, no murmurs, rubs, gallops RESPIRATORY:  Clear to auscultation without rales, wheezing or rhonchi  ABDOMEN: Soft, non-tender, non-distended MUSCULOSKELETAL:  No edema; No deformity  SKIN: Warm and dry NEUROLOGIC:  Alert and oriented x 3 PSYCHIATRIC:  Normal affect   ASSESSMENT:    No diagnosis found. PLAN:    In order of problems listed above:  HFpEF H/o CM 25-30% in 2016>>LVEF 55-60%  HTN  CKD stage 3  Obesity  OSA   Disposition: Follow up {follow up:15908} with ***   Shared Decision Making/Informed Consent   {Are you ordering a CV Procedure (e.g. stress test, cath, DCCV, TEE, etc)?   Press F2         :[id number]}    Signed, Alok Minshall David Stall, PA-C  04/04/2022 1:43 PM    [facility]

## 2022-05-27 ENCOUNTER — Encounter: Payer: Self-pay | Admitting: *Deleted

## 2022-05-30 ENCOUNTER — Ambulatory Visit: Payer: Medicare Other | Attending: Medical | Admitting: Medical

## 2022-05-30 ENCOUNTER — Encounter: Payer: Self-pay | Admitting: Medical

## 2022-05-30 VITALS — BP 145/83 | HR 95 | Ht 65.0 in | Wt 266.4 lb

## 2022-05-30 DIAGNOSIS — Z6841 Body Mass Index (BMI) 40.0 and over, adult: Secondary | ICD-10-CM

## 2022-05-30 DIAGNOSIS — I272 Pulmonary hypertension, unspecified: Secondary | ICD-10-CM | POA: Diagnosis not present

## 2022-05-30 DIAGNOSIS — I5022 Chronic systolic (congestive) heart failure: Secondary | ICD-10-CM | POA: Diagnosis not present

## 2022-05-30 DIAGNOSIS — N1831 Chronic kidney disease, stage 3a: Secondary | ICD-10-CM | POA: Diagnosis not present

## 2022-05-30 DIAGNOSIS — G4733 Obstructive sleep apnea (adult) (pediatric): Secondary | ICD-10-CM

## 2022-05-30 DIAGNOSIS — Z79899 Other long term (current) drug therapy: Secondary | ICD-10-CM

## 2022-05-30 DIAGNOSIS — I10 Essential (primary) hypertension: Secondary | ICD-10-CM | POA: Diagnosis not present

## 2022-05-30 MED ORDER — EMPAGLIFLOZIN 10 MG PO TABS: 10.0000 mg | ORAL_TABLET | Freq: Every day | ORAL | 6 refills | Status: DC

## 2022-05-30 MED ORDER — CARVEDILOL 6.25 MG PO TABS: 6.2500 mg | ORAL_TABLET | Freq: Two times a day (BID) | ORAL | 1 refills | Status: DC

## 2022-05-30 NOTE — Patient Instructions (Addendum)
Medication Instructions:  - Your physician has recommended you make the following change in your medication:   1) INCREASE Coreg (Carvedilol) to 6.25 mg: - take 1 tablet by mouth twice daily   2) START Jardiance 10 mg: - take 1 tablet by mouth once daily    Samples Given: (& free trial card provided)  Jardiance 10 mg Lot: [id number] Exp: March 2026 # 1 box   *If you need a refill on your cardiac medications before your next appointment, please call your pharmacy*   Lab Work: - Your physician recommends that you return for lab work in: 1-2 weeks  BMP  Medical Mall Entrance at [facility] 1st desk on the right to check in (REGISTRATION)  Lab hours: Monday- Friday (7:30 am- 5:30 pm)   If you have labs (blood work) drawn today and your tests are completely normal, you will receive your results only by: MyChart Message (if you have MyChart) OR A paper copy in the mail If you have any lab test that is abnormal or we need to change your treatment, we will call you to review the results.   Testing/Procedures:  1) You have been referred to : [facility] (for evaluation of sleep apnea) Their office will contact you directly for an appointment. However, if it has been more than 1 week and you have not heard from them, please call their office directly at [phone] to follow up.    2) You have been referred to : [facility] (for chronic kidney disease) We will need to fax your referral/ records to the nephrology office. They should contact you with an appointment within 2 weeks. However,  if it has been more than 2 weeks and you have not heard from them please call their office directly at [phone] to follow up.    Follow-Up: At [facility], you and your health needs are our priority.  As part of our continuing mission to provide you with exceptional heart care, we have created designated Provider Care Teams.  These Care Teams include your primary  Cardiologist (physician) and Advanced Practice Providers (APPs -  Physician Assistants and Nurse Practitioners) who all work together to provide you with the care you need, when you need it.  We recommend signing up for the patient portal called "MyChart".  Sign up information is provided on this After Visit Summary.  MyChart is used to connect with patients for Virtual Visits (Telemedicine).  Patients are able to view lab/test results, encounter notes, upcoming appointments, etc.  Non-urgent messages can be sent to your provider as well.   To learn more about what you can do with MyChart, go to [email or web address].    Your next appointment:   2 month(s)  The format for your next appointment:   In Person  Provider:   One of our general cardiologist (need to establish)      Other Instructions  Empagliflozin Tablets What is this medication? EMPAGLIFLOZIN (EM pa gli FLOE zin) treats type 2 diabetes. It works by helping your kidneys remove sugar (glucose) from your blood through the urine, which decreases your blood sugar. It may also be used to lower the risk of worsening heart failure. It can also lower the risk of death caused by heart failure and type 2 diabetes. It works by helping your kidneys remove salt (sodium) from your blood through the urine. This decreases the amount of work the heart has to do. Changes to diet and exercise are often  combined with this medication. This medicine may be used for other purposes; ask your health care provider or pharmacist if you have questions. COMMON BRAND NAME(S): Jardiance What should I tell my care team before I take this medication? They need to know if you have any of these conditions: Dehydration Diabetic ketoacidosis Diet low in salt Eating less due to illness, surgery, dieting, or any other reason Frequently drink alcohol Having surgery High cholesterol High levels of potassium in the blood History of pancreatitis or pancreas  problems History of yeast infection of the penis or vagina Infections in the bladder, kidneys, or urinary tract Kidney disease Liver disease Low blood pressure On hemodialysis Problems urinating Type 1 diabetes Uncircumcised female An unusual or allergic reaction to empagliflozin, other medications, foods, dyes, or preservatives Pregnant or trying to get pregnant Breast-feeding How should I use this medication? Take this medication by mouth with water. Take it as directed on the prescription label at the same time every day. You may take it with or without food. Keep taking it unless your care team tells you to stop. A special MedGuide will be given to you by the pharmacist with each prescription and refill. Be sure to read this information carefully each time. Talk to your care team about the use of this medication in children. While it may be prescribed for children as young as 10 years for selected conditions, precautions do apply. Overdosage: If you think you have taken too much of this medicine contact a poison control center or emergency room at once. NOTE: This medicine is only for you. Do not share this medicine with others. What if I miss a dose? If you miss a dose, take it as soon as you can. If it is almost time for your next dose, take only that dose. Do not take double or extra doses. What may interact with this medication? Alcohol Diuretics Insulin Lithium This list may not describe all possible interactions. Give your health care provider a list of all the medicines, herbs, non-prescription drugs, or dietary supplements you use. Also tell them if you smoke, drink alcohol, or use illegal drugs. Some items may interact with your medicine. What should I watch for while using this medication? Visit your care team for regular checks on your progress. Tell your care team if your symptoms do not start to get better or if they get worse. This medication can cause a serious condition  in which there is too much acid in the blood. If you develop nausea, vomiting, stomach pain, unusual tiredness, or breathing problems, stop taking this medication and call your care team right away. If possible, use a ketone dipstick to check for ketones in your urine. Check with your care team if you have severe diarrhea, nausea, and vomiting, or if you sweat a lot. The loss of too much body fluid may make it dangerous for you to take this medication. A test called the HbA1C (A1C) will be monitored. This is a simple blood test. It measures your blood sugar control over the last 2 to 3 months. You will receive this test every 3 to 6 months. Learn how to check your blood sugar. Learn the symptoms of low and high blood sugar and how to manage them. Always carry a quick-source of sugar with you in case you have symptoms of low blood sugar. Examples include hard sugar candy or glucose tablets. Make sure others know that you can choke if you eat or drink when you  develop serious symptoms of low blood sugar, such as seizures or unconsciousness. Get medical help at once. Tell your care team if you have high blood sugar. You might need to change the dose of your medication. If you are sick or exercising more than usual, you may need to change the dose of your medication. What side effects may I notice from receiving this medication? Side effects that you should report to your care team as soon as possible: Allergic reactions--skin rash, itching, hives, swelling of the face, lips, tongue, or throat Dehydration--increased thirst, dry mouth, feeling faint or lightheaded, headache, dark yellow or brown urine Diabetic ketoacidosis (DKA)--increased thirst or amount of urine, dry mouth, fatigue, fruity odor to breath, trouble breathing, stomach pain, nausea, vomiting Genital yeast infection--redness, swelling, pain, or itchiness, odor, thick or lumpy discharge New pain or tenderness, change in skin color, sores or  ulcers, infection of the leg or foot Infection or redness, swelling, tenderness, or pain in the genitals, or area from the genitals to the back of the rectum Urinary tract infection (UTI)--burning when passing urine, passing frequent small amounts of urine, bloody or cloudy urine, pain in the lower back or sides This list may not describe all possible side effects. Call your doctor for medical advice about side effects. You may report side effects to FDA at 1-800-FDA-1088. Where should I keep my medication? Keep out of the reach of children and pets. Store at room temperature between 20 and 25 degrees C (68 and 77 degrees F). Get rid of any unused medication after the expiration date. To get rid of medications that are no longer needed or have expired: Take the medication to a medication take-back program. Check with your pharmacy or law enforcement to find a location. If you cannot return the medication, check the label or package insert to see if the medication should be thrown out in the garbage or flushed down the toilet. If you are not sure, ask your care team. If it is safe to put it in the trash, take the medication out of the container. Mix the medication with cat litter, dirt, coffee grounds, or other unwanted substance. Seal the mixture in a bag or container. Put it in the trash. NOTE: This sheet is a summary. It may not cover all possible information. If you have questions about this medicine, talk to your doctor, pharmacist, or health care provider.  2023 Elsevier/Gold Standard (2020-07-26 00:00:00)   Important Information About Sugar

## 2022-05-30 NOTE — Progress Notes (Signed)
Cardiology Office Note:    Date:  05/30/2022   ID:  Sheila Hays, Sheila Hays 1971/01/07, MRN [id number]  PCP:  [place]  [facility] Cardiologist:  Peter Martinique, MD  [place] Electrophysiologist:  None   Referring MD: [facility]*   Chief Complaint: 2 month follow-up  History of Present Illness:    Sheila Hays is a 52 y.o. female with a hx of  HFimpEF, CKD stage 3, seizure disorder, uncontrolled HTN, and GERD who is being seen for 2 month follow-up.    [name] previously underwent nuclear stress test in 2016 that was abnormal, notable for perfusion defect of the lateral inferior segment with an EF of 35%. Echo at that time demonstrated an EF of 25-30%, mildly to moderately dilated LV cavity size, mild to moderate concentric LVH, mild to moderate MR, mild TR, PASP 55-60 mmHg, and a trivial pericardial effusion. Repeat echo in 2018 demonstrated a low normal LVSF with an EF of 50-55%, normal wall motion.    We previously evaluated her at [facility] in [place] for chest pain following transfer from [facility]. Nuclear stress test at that time showed a small defect of mild severity in the basal anteroseptal and mid anteroseptal location consistent with ischemia vs breast attenuation artifact with an EF of 45-54%. Overall, this was a low risk study.    Echo during admission in 11/2020 for volume overload in the setting of poorly controlled hypertension showed an EF of 55-60%, no RWMA, normal LV diastolic function parameters, normal RV systolic function and ventricular cavity size, and trivial mitral regurgitation.   She was admitted 6/28-7/14 for shortness of breath and chest pain. BNP ws 500s. CXR with b/l pleural effusions and concern for sepsis. She was admitted for diuresis with IV lasix and hypertensive urgency. [place] 11/25/21 showed severe pulmonary HTN. She had a fall in the bathroom with negative work-up. She was moved to the ICU for  milrinone drip and PICC line. Sildenafil and BB were held for hypotension. Required norepinephrine. She was eventually transitioned to torsmide 40mg  daily.    Last seen 12/11/21 and was doing well. She denied chest pain, SOB, LLE, orthopnea or pnd. She noted increasing weights and was started back on torsemide 20mg  daily.   Last seen 01/30/22 and was doubling up on Torsemide. Weight was down 7 lbs. PCP was going to check labs.   Today, the patient is overall doing well. BP is high, however  she did have her medications. She said she didn't sleep well last night. She has both Crestor and Lipitor on her medication list, however she is not on Lipitor. She is taking Torsemide 20mg  BID. No chest pain or shortness of breath. She has some lower leg edema on exam. She says she has not heard back from nephrology and is unsure if she is going to do a sleep study.   Past Medical History:  Diagnosis Date   CHF (congestive heart failure) (HCC)    Chronic kidney disease (CKD), stage III (moderate) (HCC)    GERD (gastroesophageal reflux disease)    Hypertension    Seizures ([place])    Followed at [facility]    Past Surgical History:  Procedure Laterality Date   RIGHT HEART CATH N/A 11/25/2021   Procedure: RIGHT HEART CATH;  Surgeon: Wellington Hampshire, MD;  Location: [place] [facility];  Service: Cardiovascular;  Laterality: N/A;    Current Medications: Current Meds  Medication Sig   acetaminophen (TYLENOL)  500 MG tablet Take 500 mg by mouth every 6 (six) hours as needed.   ADVAIR DISKUS 250-50 MCG/ACT AEPB Inhale 1 puff into the lungs 2 (two) times daily.   albuterol (VENTOLIN HFA) 108 (90 Base) MCG/ACT inhaler Inhale 2 puffs into the lungs every 4 (four) hours as needed.   aspirin EC 81 MG tablet Take 81 mg by mouth daily.   atorvastatin (LIPITOR) 40 MG tablet Take 1 tablet (40 mg total) by mouth daily at 6 PM.   carvedilol (COREG) 3.125 MG tablet Take 1 tablet (3.125 mg total) by mouth 2 (two) times daily with  a meal.   folic acid (FOLVITE) 1 MG tablet Take 0.5 tablets (0.5 mg total) by mouth daily.   ipratropium-albuterol (DUONEB) 0.5-2.5 (3) MG/3ML SOLN Take 3 mLs by nebulization every 6 (six) hours as needed.   lamoTRIgine (LAMICTAL) 25 MG tablet Take 25 mg by mouth 2 (two) times daily.   LINZESS 145 MCG CAPS capsule Take 145 mcg by mouth daily as needed.   omeprazole (PRILOSEC) 20 MG capsule Take 20 mg by mouth daily.   oxcarbazepine (TRILEPTAL) 600 MG tablet Take 600 mg by mouth 2 (two) times daily.   rosuvastatin (CRESTOR) 20 MG tablet Take 20 mg by mouth at bedtime.   torsemide (DEMADEX) 20 MG tablet Take 20 mg by mouth 2 (two) times daily.   traZODone (DESYREL) 100 MG tablet Take 100 mg by mouth at bedtime as needed for sleep.   vitamin C (ASCORBIC ACID) 500 MG tablet Take 500 mg by mouth daily.     Allergies:   Ciprofloxacin   Social History   Socioeconomic History   Marital status: Single    Spouse name: Not on file   Number of children: Not on file   Years of education: Not on file   Highest education level: Not on file  Occupational History   Not on file  Tobacco Use   Smoking status: Former   Smokeless tobacco: Former    Quit date: 03/06/2017  Vaping Use   Vaping Use: Never used  Substance and Sexual Activity   Alcohol use: Not Currently   Drug use: Never   Sexual activity: Not Currently  Other Topics Concern   Not on file  Social History Narrative   Not on file   Social Determinants of Health   Financial Resource Strain: Not on file  Food Insecurity: Not on file  Transportation Needs: Not on file  Physical Activity: Not on file  Stress: Not on file  Social Connections: Not on file     Family History: The patient's family history includes Diabetes in her sister; Heart failure in her sister; Hypertension in her father and mother; Seizures in her sister. There is no history of Breast cancer.  ROS:   Please see the history of present illness.     All other  systems reviewed and are negative.  EKGs/Labs/Other Studies Reviewed:    The following studies were reviewed today:  Cardiac cath 11/25/21   Hemodynamic findings consistent with severe pulmonary hypertension.   Successful right heart catheterization via the right antecubital vein. This showed evidence of severely elevated right and left-sided filling pressures, severe pulmonary hypertension and normal cardiac output.   RA: 22 mmHg RV: 92/18 with an end-diastolic pressure of 32 mmHg. PA: 95/44 with a mean of 64 mmHg PCW: 30 mmHg  Cardiac output: 7.4 with an index of 3.16. Pulmonary vascular resistance: 4.5 [place] units   Recommendations: The patient is  significantly volume overloaded. Pulmonary hypertension is severe and seems to be of a mixed venous and arterial etiology. We will start the patient on furosemide drip and the dose should be uptitrated based on urine output response.  Diuresis might be difficult due to the presence of severe pulmonary hypertension and likely RV dysfunction.  If renal function worsens with diuresis, she might require inotropic therapy for RV support and consideration to transfer to an advanced heart failure service.     Echo 10/2021  1. Left ventricular ejection fraction, by estimation, is 55 to 60%. The  left ventricle has normal function. The left ventricle has no regional  wall motion abnormalities. The left ventricular internal cavity size was  mildly dilated. There is mild left  ventricular hypertrophy. Left ventricular diastolic function could not be  evaluated.   2. Right ventricular systolic function is normal. The right ventricular  size is not well visualized.   3. Left atrial size was mild to moderately dilated.   4. The mitral valve is normal in structure. No evidence of mitral valve  regurgitation.   5. The aortic valve was not well visualized. Aortic valve regurgitation  is not visualized.   6. The inferior vena cava is normal in size  with <50% respiratory  variability, suggesting right atrial pressure of 8 mmHg.   EKG:  EKG is ordered today.  The ekg ordered today demonstrates NSR 95bpm, LAD, no ST/T wave changes  Recent Labs: 08/29/2021: ALT 18 11/20/2021: B Natriuretic Peptide 580.8 11/27/2021: Magnesium 2.0 12/03/2021: Hemoglobin 12.6; Platelets 229 12/25/2021: BUN 38; Creatinine, Ser 1.83; Potassium 3.8; Sodium 141  Recent Lipid Panel    Component Value Date/Time   CHOL 117 11/28/2021 1324   TRIG 82 11/28/2021 1324   HDL 50 11/28/2021 1324   CHOLHDL 2.3 11/28/2021 1324   VLDL 16 11/28/2021 1324   LDLCALC 51 11/28/2021 1324    Physical Exam:    VS:  BP (!) 145/83 (BP Location: Right Arm, Patient Position: Sitting, Cuff Size: Large)   Pulse 95   Ht 5\' 5"  (1.651 m)   Wt 266 lb 6.4 oz (120.8 kg)   SpO2 100%   BMI 44.33 kg/m     Wt Readings from Last 3 Encounters:  05/30/22 266 lb 6.4 oz (120.8 kg)  01/30/22 260 lb (117.9 kg)  12/25/21 262 lb 2 oz (118.9 kg)     GEN:  Well nourished, well developed in no acute distress HEENT: Normal NECK: No JVD; No carotid bruits LYMPHATICS: No lymphadenopathy CARDIAC: RRR, no murmurs, rubs, gallops RESPIRATORY:  Clear to auscultation without rales, wheezing or rhonchi  ABDOMEN: Soft, non-tender, non-distended MUSCULOSKELETAL:  No edema; No deformity  SKIN: Warm and dry NEUROLOGIC:  Alert and oriented x 3 PSYCHIATRIC:  Normal affect   ASSESSMENT:    1. Chronic systolic heart failure (HCC)   2. Pulmonary hypertension, unspecified (HCC)   3. Essential hypertension   4. Chronic kidney disease, stage 3a (HCC)   5. Class 3 severe obesity due to excess calories with body mass index (BMI) of 40.0 to 44.9 in adult, unspecified whether serious comorbidity present (HCC)   6. OSA (obstructive sleep apnea)    PLAN:    In order of problems listed above:  HFpEF with severe pulmonary HTN H/o CM 25-30% in 2016>>LVEF 55-60% Patient has trace edema on exam today. She  reports stable shortness of breath. She is taking Torsemide 20mg  BID. She missed her last visit with [facility]. I will start Jardiance  10mg  daily, BMET in 1-2 weeks. Continue Coreg.   HTN BP is elevated today, I will increase Coreg to 6.25mg BID.   CKD stage 3 Mos recent labs were stable. Refer to nephrology for CKD management.   Obesity Weight loss recommended.   OSA I will refer to pulmonology for further management.  Disposition: Follow up in 2 month(s) with MD/APP    Signed, Laurelai Lepp Ninfa Meeker, PA-C  05/30/2022 9:10 AM    [facility]

## 2022-06-02 ENCOUNTER — Other Ambulatory Visit: Payer: Self-pay

## 2022-06-02 ENCOUNTER — Inpatient Hospital Stay
Admission: EM | Admit: 2022-06-02 | Discharge: 2022-06-07 | DRG: 193 | Disposition: A | Discharge status: Home / Self Care | Payer: 59 | Attending: Internal Medicine | Admitting: Internal Medicine

## 2022-06-02 ENCOUNTER — Emergency Department: Payer: 59

## 2022-06-02 DIAGNOSIS — J441 Chronic obstructive pulmonary disease with (acute) exacerbation: Secondary | ICD-10-CM | POA: Diagnosis present

## 2022-06-02 DIAGNOSIS — Z6841 Body Mass Index (BMI) 40.0 and over, adult: Secondary | ICD-10-CM | POA: Diagnosis not present

## 2022-06-02 DIAGNOSIS — I2489 Other forms of acute ischemic heart disease: Secondary | ICD-10-CM | POA: Diagnosis present

## 2022-06-02 DIAGNOSIS — E785 Hyperlipidemia, unspecified: Secondary | ICD-10-CM | POA: Diagnosis present

## 2022-06-02 DIAGNOSIS — I160 Hypertensive urgency: Secondary | ICD-10-CM | POA: Diagnosis present

## 2022-06-02 DIAGNOSIS — J189 Pneumonia, unspecified organism: Secondary | ICD-10-CM

## 2022-06-02 DIAGNOSIS — I272 Pulmonary hypertension, unspecified: Secondary | ICD-10-CM | POA: Diagnosis present

## 2022-06-02 DIAGNOSIS — Z79899 Other long term (current) drug therapy: Secondary | ICD-10-CM

## 2022-06-02 DIAGNOSIS — Z87891 Personal history of nicotine dependence: Secondary | ICD-10-CM

## 2022-06-02 DIAGNOSIS — I130 Hypertensive heart and chronic kidney disease with heart failure and stage 1 through stage 4 chronic kidney disease, or unspecified chronic kidney disease: Secondary | ICD-10-CM | POA: Diagnosis present

## 2022-06-02 DIAGNOSIS — J9601 Acute respiratory failure with hypoxia: Secondary | ICD-10-CM | POA: Diagnosis present

## 2022-06-02 DIAGNOSIS — R0902 Hypoxemia: Principal | ICD-10-CM

## 2022-06-02 DIAGNOSIS — Z7984 Long term (current) use of oral hypoglycemic drugs: Secondary | ICD-10-CM

## 2022-06-02 DIAGNOSIS — I951 Orthostatic hypotension: Secondary | ICD-10-CM | POA: Diagnosis not present

## 2022-06-02 DIAGNOSIS — Z881 Allergy status to other antibiotic agents status: Secondary | ICD-10-CM

## 2022-06-02 DIAGNOSIS — Z7982 Long term (current) use of aspirin: Secondary | ICD-10-CM

## 2022-06-02 DIAGNOSIS — J09X1 Influenza due to identified novel influenza A virus with pneumonia: Secondary | ICD-10-CM

## 2022-06-02 DIAGNOSIS — D649 Anemia, unspecified: Secondary | ICD-10-CM | POA: Diagnosis present

## 2022-06-02 DIAGNOSIS — I509 Heart failure, unspecified: Secondary | ICD-10-CM | POA: Diagnosis not present

## 2022-06-02 DIAGNOSIS — I5023 Acute on chronic systolic (congestive) heart failure: Secondary | ICD-10-CM

## 2022-06-02 DIAGNOSIS — E876 Hypokalemia: Secondary | ICD-10-CM | POA: Diagnosis present

## 2022-06-02 DIAGNOSIS — K219 Gastro-esophageal reflux disease without esophagitis: Secondary | ICD-10-CM | POA: Diagnosis present

## 2022-06-02 DIAGNOSIS — R079 Chest pain, unspecified: Secondary | ICD-10-CM | POA: Diagnosis not present

## 2022-06-02 DIAGNOSIS — J1000 Influenza due to other identified influenza virus with unspecified type of pneumonia: Principal | ICD-10-CM | POA: Diagnosis present

## 2022-06-02 DIAGNOSIS — N1832 Chronic kidney disease, stage 3b: Secondary | ICD-10-CM | POA: Diagnosis present

## 2022-06-02 DIAGNOSIS — I5043 Acute on chronic combined systolic (congestive) and diastolic (congestive) heart failure: Secondary | ICD-10-CM | POA: Diagnosis present

## 2022-06-02 DIAGNOSIS — G4733 Obstructive sleep apnea (adult) (pediatric): Secondary | ICD-10-CM | POA: Diagnosis present

## 2022-06-02 DIAGNOSIS — G40209 Localization-related (focal) (partial) symptomatic epilepsy and epileptic syndromes with complex partial seizures, not intractable, without status epilepticus: Secondary | ICD-10-CM | POA: Diagnosis present

## 2022-06-02 DIAGNOSIS — J440 Chronic obstructive pulmonary disease with acute lower respiratory infection: Secondary | ICD-10-CM | POA: Diagnosis present

## 2022-06-02 DIAGNOSIS — R7989 Other specified abnormal findings of blood chemistry: Secondary | ICD-10-CM | POA: Diagnosis not present

## 2022-06-02 DIAGNOSIS — J101 Influenza due to other identified influenza virus with other respiratory manifestations: Secondary | ICD-10-CM | POA: Insufficient documentation

## 2022-06-02 DIAGNOSIS — Z1152 Encounter for screening for COVID-19: Secondary | ICD-10-CM | POA: Diagnosis not present

## 2022-06-02 DIAGNOSIS — Z8249 Family history of ischemic heart disease and other diseases of the circulatory system: Secondary | ICD-10-CM | POA: Diagnosis not present

## 2022-06-02 LAB — BASIC METABOLIC PANEL
Anion gap: 10 (ref 5–15)
BUN: 19 mg/dL (ref 6–20)
CO2: 28 mmol/L (ref 22–32)
Calcium: 8.9 mg/dL (ref 8.9–10.3)
Chloride: 101 mmol/L (ref 98–111)
Creatinine, Ser: 1.42 mg/dL — ABNORMAL HIGH (ref 0.44–1.00)
GFR, Estimated: 44 mL/min — ABNORMAL LOW (ref >60)
Glucose, Bld: 92 mg/dL (ref 70–99)
Potassium: 3.8 mmol/L (ref 3.5–5.1)
Sodium: 139 mmol/L (ref 135–145)

## 2022-06-02 LAB — HEPATIC FUNCTION PANEL
ALT: 23 U/L (ref 0–44)
AST: 30 U/L (ref 15–41)
Albumin: 3.7 g/dL (ref 3.5–5.0)
Alkaline Phosphatase: 82 U/L (ref 38–126)
Bilirubin, Direct: 0.1 mg/dL (ref 0.0–0.2)
Indirect Bilirubin: 0.7 mg/dL (ref 0.3–0.9)
Total Bilirubin: 0.8 mg/dL (ref 0.3–1.2)
Total Protein: 7.6 g/dL (ref 6.5–8.1)

## 2022-06-02 LAB — RESP PANEL BY RT-PCR (RSV, FLU A&B, COVID)  RVPGX2
Influenza A by PCR: POSITIVE — AB
Influenza B by PCR: NEGATIVE
Resp Syncytial Virus by PCR: NEGATIVE
SARS Coronavirus 2 by RT PCR: NEGATIVE

## 2022-06-02 LAB — CBC
HCT: 38.9 % (ref 36.0–46.0)
Hemoglobin: 11.9 g/dL — ABNORMAL LOW (ref 12.0–15.0)
MCH: 28.6 pg (ref 26.0–34.0)
MCHC: 30.6 g/dL (ref 30.0–36.0)
MCV: 93.5 fL (ref 80.0–100.0)
Platelets: 183 10*3/uL (ref 150–400)
RBC: 4.16 MIL/uL (ref 3.87–5.11)
RDW: 15.3 % (ref 11.5–15.5)
WBC: 4.7 10*3/uL (ref 4.0–10.5)
nRBC: 0 % (ref 0.0–0.2)

## 2022-06-02 LAB — PROCALCITONIN: Procalcitonin: <0.1 ng/mL

## 2022-06-02 LAB — TROPONIN I (HIGH SENSITIVITY)
Troponin I (High Sensitivity): 81 ng/L — ABNORMAL HIGH (ref <18)
Troponin I (High Sensitivity): 130 ng/L (ref <18)

## 2022-06-02 LAB — BRAIN NATRIURETIC PEPTIDE: B Natriuretic Peptide: 1073.6 pg/mL — ABNORMAL HIGH (ref 0.0–100.0)

## 2022-06-02 LAB — LACTIC ACID, PLASMA: Lactic Acid, Venous: 1.3 mmol/L (ref 0.5–1.9)

## 2022-06-02 MED ORDER — LAMOTRIGINE 25 MG PO TABS
25.0000 mg | ORAL_TABLET | Freq: Two times a day (BID) | ORAL | Status: DC
  Administered 2022-06-02 – 2022-06-07 (×10): 25 mg via ORAL
  Filled 2022-06-02 (×10): qty 1

## 2022-06-02 MED ORDER — ACETAMINOPHEN 325 MG PO TABS
650.0000 mg | ORAL_TABLET | Freq: Four times a day (QID) | ORAL | Status: DC | PRN
  Administered 2022-06-03: 650 mg via ORAL
  Filled 2022-06-02: qty 2

## 2022-06-02 MED ORDER — SODIUM CHLORIDE 0.9 % IV SOLN
500.0000 mg | INTRAVENOUS | Status: DC
  Administered 2022-06-02: 500 mg via INTRAVENOUS

## 2022-06-02 MED ORDER — METHYLPREDNISOLONE SODIUM SUCC 125 MG IJ SOLR
125.0000 mg | Freq: Once | INTRAMUSCULAR | Status: AC
  Administered 2022-06-02: 125 mg via INTRAVENOUS
  Filled 2022-06-02: qty 2

## 2022-06-02 MED ORDER — PANTOPRAZOLE SODIUM 40 MG PO TBEC
40.0000 mg | DELAYED_RELEASE_TABLET | Freq: Every day | ORAL | Status: DC
  Administered 2022-06-03 – 2022-06-05 (×3): 40 mg via ORAL
  Filled 2022-06-02 (×3): qty 1

## 2022-06-02 MED ORDER — ENOXAPARIN SODIUM 60 MG/0.6ML IJ SOSY
0.5000 mg/kg | PREFILLED_SYRINGE | INTRAMUSCULAR | Status: DC
  Administered 2022-06-02 – 2022-06-06 (×5): 60 mg via SUBCUTANEOUS
  Filled 2022-06-02 (×5): qty 0.6

## 2022-06-02 MED ORDER — IPRATROPIUM-ALBUTEROL 0.5-2.5 (3) MG/3ML IN SOLN
9.0000 mL | Freq: Once | RESPIRATORY_TRACT | Status: AC
  Administered 2022-06-02: 9 mL via RESPIRATORY_TRACT
  Filled 2022-06-02: qty 3

## 2022-06-02 MED ORDER — METHYLPREDNISOLONE SODIUM SUCC 40 MG IJ SOLR
40.0000 mg | Freq: Two times a day (BID) | INTRAMUSCULAR | Status: AC
  Administered 2022-06-03 (×2): 40 mg via INTRAVENOUS
  Filled 2022-06-02 (×2): qty 1

## 2022-06-02 MED ORDER — ASPIRIN 81 MG PO TBEC
81.0000 mg | DELAYED_RELEASE_TABLET | Freq: Every day | ORAL | Status: DC
  Administered 2022-06-03 – 2022-06-07 (×5): 81 mg via ORAL
  Filled 2022-06-02 (×5): qty 1

## 2022-06-02 MED ORDER — OSELTAMIVIR PHOSPHATE 75 MG PO CAPS
75.0000 mg | ORAL_CAPSULE | Freq: Once | ORAL | Status: DC
  Filled 2022-06-02: qty 1

## 2022-06-02 MED ORDER — ENOXAPARIN SODIUM 40 MG/0.4ML IJ SOSY: 40.0000 mg | PREFILLED_SYRINGE | INTRAMUSCULAR | Status: DC

## 2022-06-02 MED ORDER — PREDNISONE 20 MG PO TABS
40.0000 mg | ORAL_TABLET | Freq: Every day | ORAL | Status: AC
  Administered 2022-06-04 – 2022-06-07 (×4): 40 mg via ORAL
  Filled 2022-06-02 (×4): qty 2

## 2022-06-02 MED ORDER — IPRATROPIUM-ALBUTEROL 0.5-2.5 (3) MG/3ML IN SOLN
3.0000 mL | Freq: Four times a day (QID) | RESPIRATORY_TRACT | Status: DC
  Administered 2022-06-03 – 2022-06-04 (×5): 3 mL via RESPIRATORY_TRACT
  Filled 2022-06-02 (×6): qty 3

## 2022-06-02 MED ORDER — SODIUM CHLORIDE 0.9 % IV SOLN
500.0000 mg | Freq: Once | INTRAVENOUS | Status: DC
  Filled 2022-06-02: qty 5

## 2022-06-02 MED ORDER — SODIUM CHLORIDE 0.9 % IV SOLN
1.0000 g | Freq: Once | INTRAVENOUS | Status: DC
  Administered 2022-06-02: 1 g via INTRAVENOUS
  Filled 2022-06-02: qty 10

## 2022-06-02 MED ORDER — FUROSEMIDE 10 MG/ML IJ SOLN
40.0000 mg | Freq: Two times a day (BID) | INTRAMUSCULAR | Status: DC
  Administered 2022-06-03 (×2): 40 mg via INTRAVENOUS
  Filled 2022-06-02 (×2): qty 4

## 2022-06-02 MED ORDER — SODIUM CHLORIDE 0.9 % IV SOLN
2.0000 g | INTRAVENOUS | Status: DC
  Administered 2022-06-02: 1 g via INTRAVENOUS

## 2022-06-02 MED ORDER — ALBUTEROL SULFATE (2.5 MG/3ML) 0.083% IN NEBU: 2.5000 mg | INHALATION_SOLUTION | RESPIRATORY_TRACT | Status: DC | PRN

## 2022-06-02 MED ORDER — ONDANSETRON HCL 4 MG PO TABS: 4.0000 mg | ORAL_TABLET | Freq: Four times a day (QID) | ORAL | Status: DC | PRN

## 2022-06-02 MED ORDER — ACETAMINOPHEN 500 MG PO TABS
1000.0000 mg | ORAL_TABLET | Freq: Once | ORAL | Status: AC
  Administered 2022-06-02: 1000 mg via ORAL
  Filled 2022-06-02: qty 2

## 2022-06-02 MED ORDER — OSELTAMIVIR PHOSPHATE 75 MG PO CAPS
75.0000 mg | ORAL_CAPSULE | Freq: Two times a day (BID) | ORAL | Status: DC
  Administered 2022-06-02 – 2022-06-04 (×4): 75 mg via ORAL
  Filled 2022-06-02 (×4): qty 1

## 2022-06-02 MED ORDER — ACETAMINOPHEN 650 MG RE SUPP: 650.0000 mg | Freq: Four times a day (QID) | RECTAL | Status: DC | PRN

## 2022-06-02 MED ORDER — ONDANSETRON HCL 4 MG/2ML IJ SOLN: 4.0000 mg | Freq: Four times a day (QID) | INTRAMUSCULAR | Status: DC | PRN

## 2022-06-02 MED ORDER — ROSUVASTATIN CALCIUM 10 MG PO TABS
20.0000 mg | ORAL_TABLET | Freq: Every day | ORAL | Status: DC
  Administered 2022-06-02 – 2022-06-06 (×5): 20 mg via ORAL
  Filled 2022-06-02 (×2): qty 2
  Filled 2022-06-02: qty 1
  Filled 2022-06-02 (×2): qty 2

## 2022-06-02 MED ORDER — FUROSEMIDE 10 MG/ML IJ SOLN
40.0000 mg | Freq: Once | INTRAMUSCULAR | Status: AC
  Administered 2022-06-02: 40 mg via INTRAVENOUS
  Filled 2022-06-02: qty 4

## 2022-06-02 MED ORDER — CARVEDILOL 3.125 MG PO TABS
6.2500 mg | ORAL_TABLET | Freq: Two times a day (BID) | ORAL | Status: DC
  Administered 2022-06-02 – 2022-06-03 (×2): 6.25 mg via ORAL
  Filled 2022-06-02 (×2): qty 1

## 2022-06-02 NOTE — ED Notes (Signed)
Placed on 2l/ [place] 

## 2022-06-02 NOTE — ED Provider Triage Note (Signed)
Emergency Medicine Provider Triage Evaluation Note  Sheila Hays , a 52 y.o. female  was evaluated in triage.  Pt complains of fever and respiratory distress.  Review of Systems  Positive:  Negative:   Physical Exam  BP (!) 174/83 (BP Location: Right Arm)   Pulse 88   Temp (!) 102.2 F (39 C) (Oral)   Resp 16   SpO2 93%  Gen:   Awake, no distress   Resp:  Increased effort, 88% on room air, now on 3 L MSK:   Moves extremities without difficulty  Other:    Medical Decision Making  Medically screening exam initiated at 3:04 PM.  Appropriate orders placed.  [name] was informed that the remainder of the evaluation will be completed by another provider, this initial triage assessment does not replace that evaluation, and the importance of remaining in the ED until their evaluation is complete.  Patient appears to be afebrile and in respiratory distress.  Does not wear oxygen at home.  She is placed on 3 L nasal cannula.   [name], PA-C 06/02/22 1504

## 2022-06-02 NOTE — ED Provider Notes (Signed)
[facility] Provider Note    Event Date/Time   First MD Initiated Contact with Patient 06/02/22 1833     (approximate)   History   No chief complaint on file.   HPI  Sheila Hays is a 52 y.o. female with past medical history significant for morbid obesity, hypertension, hyperlipidemia, HFpEF, CKD, COPD, who presents to the emergency department shortness of breath.  Endorses 2 days of progressively worsening shortness of breath.  Endorses a productive cough.  Fever that started yesterday.  Denies any significant chest pain.  Endorses compliance with all of her home medications.  Denies any significant swelling to her lower extremity.  Tried DuoNeb treatments without significant improvement.  Denies any tobacco use.  Denies prior history of DVT or PE.  Denies recent antibiotic use.     Physical Exam   Triage Vital Signs: ED Triage Vitals  Enc Vitals Group     BP 06/02/22 1449 (!) 174/83     Pulse Rate 06/02/22 1449 88     Resp 06/02/22 1449 16     Temp 06/02/22 1449 (!) 102.2 F (39 C)     Temp Source 06/02/22 1449 Oral     SpO2 06/02/22 1449 (!) 86 %     Weight --      Height --      Head Circumference --      Peak Flow --      Pain Score 06/02/22 1447 4     Pain Loc --      Pain Edu? --      Excl. in [place]? --     Most recent vital signs: Vitals:   06/02/22 1856 06/02/22 2324  BP:  (!) 202/105  Pulse:  89  Resp:  (!) 29  Temp: 99 F (37.2 C)   SpO2:  94%    Physical Exam Constitutional:      Appearance: She is well-developed. She is obese.  HENT:     Head: Atraumatic.  Eyes:     Conjunctiva/sclera: Conjunctivae normal.  Cardiovascular:     Rate and Rhythm: Regular rhythm.     Heart sounds: No murmur heard. Pulmonary:     Effort: Respiratory distress present.     Comments: 86% on room air, placed on 4 L nasal cannula with improvement to 95%.  Tachypneic.  Diffuse inspiratory and expiratory wheezing throughout all lung  fields. Abdominal:     General: There is no distension.  Musculoskeletal:        General: Normal range of motion.     Cervical back: Normal range of motion.     Right lower leg: No edema.     Left lower leg: No edema.  Skin:    General: Skin is warm.  Neurological:     Mental Status: She is alert. Mental status is at baseline.     IMPRESSION / MDM / ASSESSMENT AND PLAN / ED COURSE  I reviewed the triage vital signs and the nursing notes.  Differential diagnosis including COPD exacerbation, heart failure exacerbation, viral illness including COVID/influenza/RSV, bacterial pneumonia\   EKG  I, Nathaniel Man, the attending physician, personally viewed and interpreted this ECG.   Rate: Normal  Rhythm: Normal sinus  Axis: Normal  Intervals: Normal  ST&T Change: None  No tachycardic or bradycardic dysrhythmias while on cardiac telemetry.  RADIOLOGY I independently reviewed imaging, my interpretation of imaging: Chest x-ray concerning for right lower lobe pneumonia.  Read as concern for new infection.  Cardiomegaly noted.  LABS (all labs ordered are listed, but only abnormal results are displayed) Labs interpreted as -  Influenza A positive.  Significantly elevated BNP in the 1000's.  Troponin came back elevated.  Serial troponin with significant increase up to 130.  No chest pain at this time.  Most likely demand ischemia.  Labs Reviewed  RESP PANEL BY RT-PCR (RSV, FLU A&B, COVID)  RVPGX2 - Abnormal; Notable for the following components:      Result Value   Influenza A by PCR POSITIVE (*)    All other components within normal limits  BASIC METABOLIC PANEL - Abnormal; Notable for the following components:   Creatinine, Ser 1.42 (*)    GFR, Estimated 44 (*)    All other components within normal limits  CBC - Abnormal; Notable for the following components:   Hemoglobin 11.9 (*)    All other components within normal limits  BRAIN NATRIURETIC PEPTIDE - Abnormal; Notable for  the following components:   B Natriuretic Peptide 1,073.6 (*)    All other components within normal limits  TROPONIN I (HIGH SENSITIVITY) - Abnormal; Notable for the following components:   Troponin I (High Sensitivity) 81 (*)    All other components within normal limits  TROPONIN I (HIGH SENSITIVITY) - Abnormal; Notable for the following components:   Troponin I (High Sensitivity) 130 (*)    All other components within normal limits  CULTURE, BLOOD (ROUTINE X 2)  CULTURE, BLOOD (ROUTINE X 2)  HEPATIC FUNCTION PANEL  LACTIC ACID, PLASMA  PROCALCITONIN  LACTIC ACID, PLASMA  HIV ANTIBODY (ROUTINE TESTING W REFLEX)    TREATMENT   IV Lasix, IV Rocephin and azithromycin to cover for community-acquired pneumonia, renally dosed Tamiflu for positive influenza, DuoNebs and IV Solu-Medrol for concern for COPD exacerbation.   PROCEDURES:  Critical Care performed: Yes  .Critical Care  Performed by: Corena Herter, MD Authorized by: Corena Herter, MD   Critical care provider statement:    Critical care time (minutes):  30   Critical care time was exclusive of:  Separately billable procedures and treating other patients   Critical care was necessary to treat or prevent imminent or life-threatening deterioration of the following conditions:  Respiratory failure   Critical care was time spent personally by me on the following activities:  Development of treatment plan with patient or surrogate, discussions with consultants, evaluation of patient's response to treatment, examination of patient, ordering and review of laboratory studies, ordering and review of radiographic studies, ordering and performing treatments and interventions, pulse oximetry, re-evaluation of patient's condition and review of old charts   Patient's presentation is most consistent with acute presentation with potential threat to life or bodily function.   MEDICATIONS ORDERED IN ED: Medications  carvedilol (COREG)  tablet 6.25 mg (6.25 mg Oral Given 06/02/22 2326)  aspirin EC tablet 81 mg (has no administration in time range)  rosuvastatin (CRESTOR) tablet 20 mg (20 mg Oral Given 06/02/22 2327)  pantoprazole (PROTONIX) EC tablet 40 mg (has no administration in time range)  lamoTRIgine (LAMICTAL) tablet 25 mg (25 mg Oral Given 06/02/22 2326)  oseltamivir (TAMIFLU) capsule 75 mg (75 mg Oral Given 06/02/22 2016)  acetaminophen (TYLENOL) tablet 650 mg (has no administration in time range)    Or  acetaminophen (TYLENOL) suppository 650 mg (has no administration in time range)  ondansetron (ZOFRAN) tablet 4 mg (has no administration in time range)    Or  ondansetron (ZOFRAN) injection 4 mg (has no administration in time  range)  furosemide (LASIX) injection 40 mg (has no administration in time range)  cefTRIAXone (ROCEPHIN) 2 g in sodium chloride 0.9 % 100 mL IVPB (0 g Intravenous Stopped 06/02/22 2311)  azithromycin (ZITHROMAX) 500 mg in sodium chloride 0.9 % 250 mL IVPB (0 mg Intravenous Hold 06/02/22 2031)  methylPREDNISolone sodium succinate (SOLU-MEDROL) 40 mg/mL injection 40 mg (has no administration in time range)    Followed by  predniSONE (DELTASONE) tablet 40 mg (has no administration in time range)  ipratropium-albuterol (DUONEB) 0.5-2.5 (3) MG/3ML nebulizer solution 3 mL (3 mLs Nebulization Not Given 06/02/22 2019)  albuterol (PROVENTIL) (2.5 MG/3ML) 0.083% nebulizer solution 2.5 mg (has no administration in time range)  enoxaparin (LOVENOX) injection 60 mg (60 mg Subcutaneous Given 06/02/22 2327)  acetaminophen (TYLENOL) tablet 1,000 mg (1,000 mg Oral Given 06/02/22 1506)  methylPREDNISolone sodium succinate (SOLU-MEDROL) 125 mg/2 mL injection 125 mg (125 mg Intravenous Given 06/02/22 1951)  furosemide (LASIX) injection 40 mg (40 mg Intravenous Given 06/02/22 2006)  ipratropium-albuterol (DUONEB) 0.5-2.5 (3) MG/3ML nebulizer solution 9 mL (9 mLs Nebulization Given 06/02/22 1951)    FINAL CLINICAL IMPRESSION(S) / ED  DIAGNOSES   Final diagnoses:  Hypoxia  COPD exacerbation (HCC)  Influenza A  Acute congestive heart failure, unspecified heart failure type (HCC)  Elevated troponin     Rx / DC Orders   ED Discharge Orders     None        Note:  This document was prepared using Dragon voice recognition software and may include unintentional dictation errors.   Corena Herter, MD 06/02/22 [id number]

## 2022-06-02 NOTE — Assessment & Plan Note (Signed)
Not yet on CPAP pending pulmonology follow-up (per review of recent records from cardiologist on 1/5)

## 2022-06-02 NOTE — Assessment & Plan Note (Signed)
Continue Lamictal and Trileptal

## 2022-06-02 NOTE — Assessment & Plan Note (Signed)
Followed by pulmonology 

## 2022-06-02 NOTE — Assessment & Plan Note (Signed)
Possible bacterial pneumonia COPD exacerbation Continue Tamiflu started in the ED Will continue Rocephin and azithromycin pending procalcitonin Antitussives, incentive spirometer Scheduled and as needed nebulized bronchodilators IV steroids Follow blood cultures

## 2022-06-02 NOTE — ED Triage Notes (Signed)
Pt comes via EMS from home with c/o increased sob for 2 days. Pt states worse today on exertion. Pt state dry cough. Pt states hx of copd. Lungs had some wheezing and no relief with inhaler. 1 duoneb given  RA 90-93 and did drop to 87% when ambulating.

## 2022-06-02 NOTE — Assessment & Plan Note (Signed)
Complicating factor to overall prognosis and care 

## 2022-06-02 NOTE — Assessment & Plan Note (Signed)
Renal function at baseline 

## 2022-06-02 NOTE — ED Notes (Signed)
Sats 88-94%.  Increased oxygen to 4l/ [place]

## 2022-06-02 NOTE — H&P (Signed)
History and Physical    Patient: Sheila Hays AYT:[id number] DOB: [birth year] DOA: 06/02/2022 DOS: the patient was seen and examined on 06/02/2022 PCP: [facility]  Patient coming from: Home  Chief Complaint: No chief complaint on file.   HPI: Sheila Hays is a 52 y.o. female with medical history significant for HFimpEF (H/o CM 25-30% in 2016>>LVEF 55-60%) , CKD stage 3b, seizure disorder, uncontrolled HTN, class III obesity, untreated OSA pending pulmonology follow-up, severe pulmonary hypertension on right heart cath 11/25/2021, last evaluated by her cardiologist 3 days ago on 05/30/2022, who presents to the ED with a 2-day history of cough and shortness of breath not improving with home bronchodilators.  She also has a fever and also has lower extremity edema that was also noted during her recent cardiology appointment.  She denies chest pain, vomiting, abdominal pain or nau sea. EDCourse and data review: BP 180/94 with pulse 88.  Tmax 102.2 with respirations 16 and O2 sat 86% on room air.  Influenza A positive.  Troponin 81 and BNP 1073.  CBC within normal limits and hemoglobin at baseline at 11.9.  Creatinine at baseline at 1.42.  Hepatic function panel normal.  Lactic acid pending.  EKG, personally viewed and interpreted shows NSR at 89 with nonspecific ST-T wave changes.  Chest x-ray consistent with left lower lobe pneumonia as follows: IMPRESSION: Focal opacity at the right lung base, new from prior exam, and worrisome for infection.  Patient required O2 at 4 L to maintain sats in the high 90s.  She was treated with DuoNebs, Rocephin and azithromycin and started on Tamiflu.  Hospitalist consulted for admission.   Review of Systems: As mentioned in the history of present illness. All other systems reviewed and are negative.  Past Medical History:  Diagnosis Date   CHF (congestive heart failure) (HCC)    Chronic kidney disease (CKD), stage III (moderate) (HCC)     GERD (gastroesophageal reflux disease)    Hypertension    Seizures (HCC)    Follow[facility]   Past Surgical History:  Procedure Laterality Date   RIGHT HEART CATH N/A 11/25/2021   Procedure: RIGHT HEART CATH;  Surgeon: Iran Ouch, MD;  Location: [facility] INVASIVE CV LAB;  Service: Cardiovascular;  Laterality: N/A;   Social History:  reports that she has quit smoking. She quit smokeless tobacco use about 5 years ago. She reports that she does not currently use alcohol. She reports that she does not use drugs.  Allergies  Allergen Reactions   Ciprofloxacin Itching    Family History  Problem Relation Age of Onset   Hypertension Mother    Hypertension Father    Heart failure Sister    Diabetes Sister    Seizures Sister    Breast cancer Neg Hx     Prior to Admission medications   Medication Sig Start Date End Date Taking? Authorizing Provider  acetaminophen (TYLENOL) 500 MG tablet Take 500 mg by mouth every 6 (six) hours as needed.    [provider]  ADVAIR DISKUS 250-50 MCG/ACT AEPB Inhale 1 puff into the lungs 2 (two) times daily. 11/18/21   [provider]  albuterol (VENTOLIN HFA) 108 (90 Base) MCG/ACT inhaler Inhale 2 puffs into the lungs every 4 (four) hours as needed. 10/16/20   [provider]  aspirin EC 81 MG tablet Take 81 mg by mouth daily.    [provider]  calcium carbonate (TUMS - DOSED IN MG ELEMENTAL CALCIUM) 500  MG chewable tablet Chew 1 tablet (200 mg of elemental calcium total) by mouth 3 (three) times daily as needed for indigestion or heartburn. Patient not taking: Reported on 05/30/2022 12/06/21   Sunnie Nielsen, DO  carvedilol (COREG) 6.25 MG tablet Take 1 tablet (6.25 mg total) by mouth 2 (two) times daily. 05/30/22   Furth, Cadence H, PA-C  empagliflozin (JARDIANCE) 10 MG TABS tablet Take 1 tablet (10 mg total) by mouth daily before breakfast. 05/30/22   Furth, Cadence H, PA-C  folic acid (FOLVITE) 1 MG tablet Take 0.5  tablets (0.5 mg total) by mouth daily. 12/07/21   Sunnie Nielsen, DO  ipratropium-albuterol (DUONEB) 0.5-2.5 (3) MG/3ML SOLN Take 3 mLs by nebulization every 6 (six) hours as needed. 11/20/21   [provider]  lamoTRIgine (LAMICTAL) 25 MG tablet Take 25 mg by mouth 2 (two) times daily. 10/08/21   [provider]  LINZESS 145 MCG CAPS capsule Take 145 mcg by mouth daily as needed. 10/08/21   [provider]  montelukast (SINGULAIR) 10 MG tablet Take 10 mg by mouth daily. Patient not taking: Reported on 05/30/2022 11/06/21   [provider]  omeprazole (PRILOSEC) 20 MG capsule Take 20 mg by mouth daily.    [provider]  oxcarbazepine (TRILEPTAL) 600 MG tablet Take 600 mg by mouth 2 (two) times daily.    [provider]  rosuvastatin (CRESTOR) 20 MG tablet Take 20 mg by mouth at bedtime. 12/10/21   [provider]  torsemide (DEMADEX) 20 MG tablet Take 20 mg by mouth 2 (two) times daily. 12/10/21   [provider]  traZODone (DESYREL) 100 MG tablet Take 100 mg by mouth at bedtime as needed for sleep.    [provider]  vitamin C (ASCORBIC ACID) 500 MG tablet Take 500 mg by mouth daily.    [provider]    Physical Exam: Vitals:   06/02/22 1456 06/02/22 1839 06/02/22 1841 06/02/22 1856  BP:  (!) 180/94    Pulse:   83   Resp:   14   Temp:    99 F (37.2 C)  TempSrc:    Oral  SpO2: 93%  100%    Physical Exam Vitals and nursing note reviewed.  Constitutional:      General: She is not in acute distress.    Appearance: She is morbidly obese. She is ill-appearing.     Interventions: Nasal cannula in place.     Comments: Unable to speak in full sentences, tachypneic  HENT:     Head: Normocephalic and atraumatic.  Cardiovascular:     Rate and Rhythm: Normal rate and regular rhythm.     Heart sounds: Normal heart sounds.  Pulmonary:     Effort: Tachypnea present.     Breath sounds: Normal breath  sounds.  Abdominal:     Palpations: Abdomen is soft.     Tenderness: There is no abdominal tenderness.  Neurological:     Mental Status: Mental status is at baseline.     Labs on Admission: I have personally reviewed following labs and imaging studies  CBC: Recent Labs  Lab 06/02/22 1448  WBC 4.7  HGB 11.9*  HCT 38.9  MCV 93.5  PLT 183   Basic Metabolic Panel: Recent Labs  Lab 06/02/22 1448  NA 139  K 3.8  CL 101  CO2 28  GLUCOSE 92  BUN 19  CREATININE 1.42*  CALCIUM 8.9   GFR: Estimated Creatinine Clearance: 59.7 mL/min (A) (by  C-G formula based on SCr of 1.42 mg/dL (H)). Liver Function Tests: Recent Labs  Lab 06/02/22 1506  AST 30  ALT 23  ALKPHOS 82  BILITOT 0.8  PROT 7.6  ALBUMIN 3.7   No results for input(s): "LIPASE", "AMYLASE" in the last 168 hours. No results for input(s): "AMMONIA" in the last 168 hours. Coagulation Profile: No results for input(s): "INR", "PROTIME" in the last 168 hours. Cardiac Enzymes: No results for input(s): "CKTOTAL", "CKMB", "CKMBINDEX", "TROPONINI" in the last 168 hours. BNP (last 3 results) No results for input(s): "PROBNP" in the last 8760 hours. HbA1C: No results for input(s): "HGBA1C" in the last 72 hours. CBG: No results for input(s): "GLUCAP" in the last 168 hours. Lipid Profile: No results for input(s): "CHOL", "HDL", "LDLCALC", "TRIG", "CHOLHDL", "LDLDIRECT" in the last 72 hours. Thyroid Function Tests: No results for input(s): "TSH", "T4TOTAL", "FREET4", "T3FREE", "THYROIDAB" in the last 72 hours. Anemia Panel: No results for input(s): "VITAMINB12", "FOLATE", "FERRITIN", "TIBC", "IRON", "RETICCTPCT" in the last 72 hours. Urine analysis:    Component Value Date/Time   COLORURINE YELLOW (A) 06/14/2021 1830   APPEARANCEUR CLEAR (A) 06/14/2021 1830   LABSPEC 1.011 06/14/2021 1830   PHURINE 5.0 06/14/2021 1830   GLUCOSEU NEGATIVE 06/14/2021 1830   HGBUR NEGATIVE 06/14/2021 1830   BILIRUBINUR NEGATIVE  06/14/2021 1830   KETONESUR NEGATIVE 06/14/2021 1830   PROTEINUR 100 (A) 06/14/2021 1830   NITRITE NEGATIVE 06/14/2021 1830   LEUKOCYTESUR LARGE (A) 06/14/2021 1830    Radiological Exams on Admission: DG Chest 2 View  Result Date: 06/02/2022 CLINICAL DATA:  Shortness of breath EXAM: CHEST - 2 VIEW COMPARISON:  CXR 11/30/21 FINDINGS: No pleural effusion. No pneumothorax. There is a focal opacity at the right lung base, new from prior exam. Unchanged enlarged cardiac contours. No displaced rib fractures. Visualized upper abdomen is unremarkable. Vertebral body heights are maintained. IMPRESSION: Focal opacity at the right lung base, new from prior exam, and worrisome for infection. Electronically Signed   By: Marin Roberts M.D.   On: 06/02/2022 15:21     Data Reviewed: Relevant notes from primary care and specialist visits, past discharge summaries as available in EHR, including Care Everywhere. Prior diagnostic testing as pertinent to current admission diagnoses Updated medications and problem lists for reconciliation ED course, including vitals, labs, imaging, treatment and response to treatment Triage notes, nursing and pharmacy notes and ED provider's notes Notable results as noted in HPI   Assessment and Plan: * Influenza A with pneumonia Possible bacterial pneumonia COPD exacerbation Continue Tamiflu started in the ED Will continue Rocephin and azithromycin pending procalcitonin Antitussives, incentive spirometer Scheduled and as needed nebulized bronchodilators IV steroids Follow blood cultures  Acute respiratory failure with hypoxia ([place]) Multifactorial related to influenza A with pneumonia, COPD exacerbation with some element of CHF exacerbation Patient also has underlying severe pulmonary hypertension and untreated OSA, likely contributory Patient presented with shortness of breath and difficulty speaking in complete sentences, O2 sats 86% on room air requiring 4 L to  maintain sats in the 90s Continue supplemental oxygen to keep sats over 92% Treat each contributing acute etiology as will be separately outlined  Acute on chronic systolic CHF (congestive heart failure) (HCC) Hypertensive urgency Elevated troponin History of cardiomyopathy, EF 25-30% in 2016>>LVEF 55-60% 10/2021 Suspect elevated troponin of 81 secondary to demand ischemia from acute illness as patient denies chest pain and EKG is nonacute Trigger likely related to acute infection and uncontrolled hypertension Received IV Lasix in the ED Will  continue IV Lasix to replace home torsemide Continue home carvedilol, aspirin, statin Continue to trend troponin Daily weights with intake and output monitoring Will repeat echocardiogram  OSA (obstructive sleep apnea) Not yet on CPAP pending pulmonology follow-up (per review of recent records from cardiologist on 1/5)  Stage 3b chronic kidney disease (HCC) Renal function at baseline  Pulmonary hypertension (HCC) Followed by pulmonology  Complex partial seizure disorder (HCC) Continue Lamictal and Trileptal  Obesity, Class III, BMI 40-49.9 (morbid obesity) (HCC) Complicating factor to overall prognosis and care        DVT prophylaxis: Lovenox  Consults: none  Advance Care Planning:   Code Status: Prior   Family Communication: none  Disposition Plan: Back to previous home environment  Severity of Illness: The appropriate patient status for this patient is INPATIENT. Inpatient status is judged to be reasonable and necessary in order to provide the required intensity of service to ensure the patient's safety. The patient's presenting symptoms, physical exam findings, and initial radiographic and laboratory data in the context of their chronic comorbidities is felt to place them at high risk for further clinical deterioration. Furthermore, it is not anticipated that the patient will be medically stable for discharge from the hospital  within 2 midnights of admission.   * I certify that at the point of admission it is my clinical judgment that the patient will require inpatient hospital care spanning beyond 2 midnights from the point of admission due to high intensity of service, high risk for further deterioration and high frequency of surveillance required.*  Author: Andris Baumann, MD 06/02/2022 7:37 PM  For on call review [email or web address].

## 2022-06-02 NOTE — Progress Notes (Signed)
PHARMACIST - PHYSICIAN COMMUNICATION  CONCERNING:  Enoxaparin (Lovenox) for DVT Prophylaxis    RECOMMENDATION: Patient was prescribed enoxaprin 40mg  q24 hours for VTE prophylaxis.   There were no vitals filed for this visit.  There is no height or weight on file to calculate BMI.  Estimated Creatinine Clearance: 59.7 mL/min (A) (by C-G formula based on SCr of 1.42 mg/dL (H)).   Based on [place] patient is candidate for enoxaparin 0.5mg /kg TBW SQ every 24 hours based on BMI being >30.  DESCRIPTION: Pharmacy has adjusted enoxaparin dose per [facility] policy.  Patient is now receiving enoxaparin 60 mg every 24 hours    Vira Blanco, PharmD Clinical Pharmacist  06/02/2022 8:03 PM

## 2022-06-02 NOTE — Assessment & Plan Note (Signed)
Hypertensive urgency Elevated troponin History of cardiomyopathy, EF 25-30% in 2016>>LVEF 55-60% 10/2021 Suspect elevated troponin of 81 secondary to demand ischemia from acute illness as patient denies chest pain and EKG is nonacute Trigger likely related to acute infection and uncontrolled hypertension Received IV Lasix in the ED Will continue IV Lasix to replace home torsemide Continue home carvedilol, aspirin, statin Continue to trend troponin Daily weights with intake and output monitoring Will repeat echocardiogram

## 2022-06-02 NOTE — Assessment & Plan Note (Signed)
Multifactorial related to influenza A with pneumonia, COPD exacerbation with some element of CHF exacerbation Patient also has underlying severe pulmonary hypertension and untreated OSA, likely contributory Patient presented with shortness of breath and difficulty speaking in complete sentences, O2 sats 86% on room air requiring 4 L to maintain sats in the 90s Continue supplemental oxygen to keep sats over 92% Treat each contributing acute etiology as will be separately outlined

## 2022-06-03 ENCOUNTER — Encounter: Payer: Self-pay | Admitting: Internal Medicine

## 2022-06-03 ENCOUNTER — Inpatient Hospital Stay (HOSPITAL_COMMUNITY)
Admit: 2022-06-03 | Discharge: 2022-06-03 | Disposition: A | Discharge status: Home / Self Care | Payer: 59 | Attending: Internal Medicine | Admitting: Internal Medicine

## 2022-06-03 ENCOUNTER — Telehealth: Payer: Self-pay | Admitting: Medical

## 2022-06-03 DIAGNOSIS — J09X1 Influenza due to identified novel influenza A virus with pneumonia: Secondary | ICD-10-CM | POA: Diagnosis not present

## 2022-06-03 DIAGNOSIS — I5023 Acute on chronic systolic (congestive) heart failure: Secondary | ICD-10-CM

## 2022-06-03 LAB — TROPONIN I (HIGH SENSITIVITY)
Troponin I (High Sensitivity): 138 ng/L (ref <18)
Troponin I (High Sensitivity): 165 ng/L (ref <18)
Troponin I (High Sensitivity): 167 ng/L (ref <18)
Troponin I (High Sensitivity): 157 ng/L (ref <18)
Troponin I (High Sensitivity): 150 ng/L (ref <18)

## 2022-06-03 LAB — BASIC METABOLIC PANEL
Anion gap: 10 (ref 5–15)
BUN: 19 mg/dL (ref 6–20)
CO2: 26 mmol/L (ref 22–32)
Calcium: 8.7 mg/dL — ABNORMAL LOW (ref 8.9–10.3)
Chloride: 101 mmol/L (ref 98–111)
Creatinine, Ser: 1.41 mg/dL — ABNORMAL HIGH (ref 0.44–1.00)
GFR, Estimated: 45 mL/min — ABNORMAL LOW (ref >60)
Glucose, Bld: 98 mg/dL (ref 70–99)
Potassium: 3.6 mmol/L (ref 3.5–5.1)
Sodium: 137 mmol/L (ref 135–145)

## 2022-06-03 LAB — HIV ANTIBODY (ROUTINE TESTING W REFLEX): HIV Screen 4th Generation wRfx: NONREACTIVE

## 2022-06-03 LAB — LACTIC ACID, PLASMA: Lactic Acid, Venous: 0.9 mmol/L (ref 0.5–1.9)

## 2022-06-03 MED ORDER — LINACLOTIDE 145 MCG PO CAPS
145.0000 ug | ORAL_CAPSULE | Freq: Every day | ORAL | Status: DC
  Administered 2022-06-04: 145 ug via ORAL
  Filled 2022-06-03 (×4): qty 1

## 2022-06-03 MED ORDER — OXCARBAZEPINE 300 MG PO TABS
600.0000 mg | ORAL_TABLET | Freq: Two times a day (BID) | ORAL | Status: DC
  Administered 2022-06-03 – 2022-06-07 (×9): 600 mg via ORAL
  Filled 2022-06-03 (×9): qty 2

## 2022-06-03 MED ORDER — CARVEDILOL 12.5 MG PO TABS
12.5000 mg | ORAL_TABLET | Freq: Two times a day (BID) | ORAL | Status: DC
  Administered 2022-06-03 – 2022-06-07 (×8): 12.5 mg via ORAL
  Filled 2022-06-03 (×8): qty 1

## 2022-06-03 MED ORDER — AMLODIPINE BESYLATE 10 MG PO TABS
10.0000 mg | ORAL_TABLET | Freq: Every day | ORAL | Status: DC
  Administered 2022-06-03 – 2022-06-07 (×5): 10 mg via ORAL
  Filled 2022-06-03 (×5): qty 1

## 2022-06-03 MED ORDER — GUAIFENESIN 100 MG/5ML PO LIQD
5.0000 mL | ORAL | Status: DC | PRN
  Administered 2022-06-04: 5 mL via ORAL
  Filled 2022-06-03: qty 5
  Filled 2022-06-03: qty 10
  Filled 2022-06-03: qty 5
  Filled 2022-06-03: qty 10

## 2022-06-03 MED ORDER — OXYCODONE HCL 5 MG PO TABS
5.0000 mg | ORAL_TABLET | ORAL | Status: DC | PRN
  Administered 2022-06-07: 5 mg via ORAL
  Filled 2022-06-03: qty 1

## 2022-06-03 MED ORDER — SENNOSIDES-DOCUSATE SODIUM 8.6-50 MG PO TABS: 1.0000 | ORAL_TABLET | Freq: Every evening | ORAL | Status: DC | PRN

## 2022-06-03 MED ORDER — METOPROLOL TARTRATE 5 MG/5ML IV SOLN: 5.0000 mg | INTRAVENOUS | Status: DC | PRN

## 2022-06-03 MED ORDER — MONTELUKAST SODIUM 10 MG PO TABS
10.0000 mg | ORAL_TABLET | Freq: Every day | ORAL | Status: DC
  Administered 2022-06-03 – 2022-06-07 (×5): 10 mg via ORAL
  Filled 2022-06-03 (×5): qty 1

## 2022-06-03 MED ORDER — TRAZODONE HCL 50 MG PO TABS
50.0000 mg | ORAL_TABLET | Freq: Every evening | ORAL | Status: DC | PRN
  Administered 2022-06-03 – 2022-06-06 (×4): 50 mg via ORAL
  Filled 2022-06-03 (×4): qty 1

## 2022-06-03 MED ORDER — IPRATROPIUM-ALBUTEROL 0.5-2.5 (3) MG/3ML IN SOLN
3.0000 mL | RESPIRATORY_TRACT | Status: DC | PRN
  Administered 2022-06-05 (×2): 3 mL via RESPIRATORY_TRACT
  Filled 2022-06-03: qty 3

## 2022-06-03 MED ORDER — HYDRALAZINE HCL 20 MG/ML IJ SOLN
10.0000 mg | INTRAMUSCULAR | Status: DC | PRN
  Administered 2022-06-03: 10 mg via INTRAVENOUS
  Filled 2022-06-03: qty 1

## 2022-06-03 MED ORDER — BUDESONIDE 0.5 MG/2ML IN SUSP
0.5000 mg | Freq: Two times a day (BID) | RESPIRATORY_TRACT | Status: DC
  Administered 2022-06-03 – 2022-06-07 (×9): 0.5 mg via RESPIRATORY_TRACT
  Filled 2022-06-03 (×9): qty 2

## 2022-06-03 NOTE — Evaluation (Signed)
Occupational Therapy Evaluation Patient Details Name: Sheila Hays MRN: [id number] DOB: Dec 20, 1970 Today's Date: 06/03/2022   History of Present Illness 52 year old with history of CHF with EF 60%, CKD stage IIIb, seizure, uncontrolled HTN, obesity, untreated OSA, severe pulmonary hypertension comes to the hospital with complaints of cough and shortness of breath.  Upon admission she was found to have influenza A infection and elevated BNP.  Chest x-ray was suggestive of possible left lower lobe pneumonia.   Clinical Impression   Sheila Hays was seen for OT treatment on this date. Upon arrival to room pt awake/alert, seated EOB. Pt presents to OT services at or near her baseline level of functional independence. She currently requires 3L supplemental O2 and denies home O2 use. Pt able to go briefly on RA with O2 sat dropping from 97% on 3L to 90% on RA during functional mobility. Pt denies feeling SOB, but endorses increased fatigue with activity. Replaced on 3L and educated on activity pacing and PLB. Otherwise pt does not require assist for ADL management and endorses being up ad lib in room. No further skilled OT needs identified. Will sign off at this time. Do not anticipate the need for follow up OT services up on hospital DC.     Recommendations for follow up therapy are one component of a multi-disciplinary discharge planning process, led by the attending physician.  Recommendations may be updated based on patient status, additional functional criteria and insurance authorization.   Follow Up Recommendations  No OT follow up     Assistance Recommended at Discharge PRN  Patient can return home with the following      Functional Status Assessment     Equipment Recommendations  None recommended by OT    Recommendations for Other Services       Precautions / Restrictions Precautions Precautions: Fall Precaution Comments: Moderate Restrictions Weight Bearing Restrictions: No       Mobility Bed Mobility Overal bed mobility: Independent                  Transfers Overall transfer level: Independent                        Balance Overall balance assessment: No apparent balance deficits (not formally assessed)                                         ADL either performed or assessed with clinical judgement   ADL Overall ADL's : At baseline                                       General ADL Comments: At or near baseline level of functional independence for ADL management. Performs toileting independently, manages bed/functional mobility without AE.     Vision Baseline Vision/History: 1 Wears glasses Patient Visual Report: No change from baseline       Perception     Praxis      Pertinent Vitals/Pain Pain Assessment Pain Assessment: 0-10 Pain Score: 7  Pain Location: chest/back Pain Descriptors / Indicators: Sore Pain Intervention(s): Limited activity within patient's tolerance, Monitored during session     Hand Dominance Right   Extremity/Trunk Assessment Upper Extremity Assessment Upper Extremity Assessment: Generalized weakness   Lower Extremity Assessment Lower Extremity Assessment: Generalized  weakness       Communication Communication Communication: No difficulties   Cognition Arousal/Alertness: Awake/alert Behavior During Therapy: WFL for tasks assessed/performed Overall Cognitive Status: Within Functional Limits for tasks assessed                                       General Comments       Exercises Other Exercises Other Exercises: Pt educated on role of OT in acute setting, ECS to maximize safety and functional independence, and DC recs.   Shoulder Instructions      Home Living Family/patient expects to be discharged to:: Private residence Living Arrangements: Alone Available Help at Discharge: Friend(s) Type of Home: Apartment Home Access: Level  entry     Home Layout: One level     Bathroom Shower/Tub: Producer, television/film/video: Standard     Home Equipment: Grab bars - tub/shower          Prior Functioning/Environment Prior Level of Function : Independent/Modified Independent;History of Falls (last six months)                        OT Problem List: Cardiopulmonary status limiting activity;Decreased safety awareness;Decreased knowledge of use of DME or AE      OT Treatment/Interventions:      OT Goals(Current goals can be found in the care plan section) Acute Rehab OT Goals Patient Stated Goal: To feel better OT Goal Formulation: All assessment and education complete, DC therapy Time For Goal Achievement: 06/03/22 Potential to Achieve Goals: Good  OT Frequency:      Co-evaluation              AM-PAC OT "6 Clicks" Daily Activity     Outcome Measure Help from another person eating meals?: None Help from another person taking care of personal grooming?: None Help from another person toileting, which includes using toliet, bedpan, or urinal?: None Help from another person bathing (including washing, rinsing, drying)?: None Help from another person to put on and taking off regular upper body clothing?: None Help from another person to put on and taking off regular lower body clothing?: None 6 Click Score: 24   End of Session Equipment Utilized During Treatment: Oxygen  Activity Tolerance: Patient tolerated treatment well Patient left: in bed;with call bell/phone within reach (Pt recieved with no bed alarm on, up ad lib in room for bathroom use.)  OT Visit Diagnosis: Other abnormalities of gait and mobility (R26.89)                Time: 3329-5188 OT Time Calculation (min): 13 min Charges:  OT General Charges $OT Visit: 1 Visit OT Evaluation $OT Eval Low Complexity: 1 Low  Rockney Ghee, M.S., OTR/L 06/03/22, 2:05 PM

## 2022-06-03 NOTE — Discharge Instructions (Addendum)
Low Sodium Nutrition Therapy  Eating less sodium can help you if you have high blood pressure, heart failure, or kidney or liver disease.   Your body needs a little sodium, but too much sodium can cause your body to hold onto extra water. This extra water will raise your blood pressure and can cause damage to your heart, kidneys, or liver as they are forced to work harder.   Sometimes you can see how the extra fluid affects you because your hands, legs, or belly swell. You may also hold water around your heart and lungs, which makes it hard to breathe.   Even if you take medication for blood pressure or a water pill (diuretic) to remove fluid, it is still important to have less salt in your diet.   Check with your primary care provider before drinking alcohol since it may affect the amount of fluid in your body and how your heart, kidneys, or liver work. Sodium in Food A low-sodium meal plan limits the sodium that you get from food and beverages to 1,500-2,000 milligrams (mg) per day. Salt is the main source of sodium. Read the nutrition label on the package to find out how much sodium is in one serving of a food.  Select foods with 140 milligrams (mg) of sodium or less per serving.  You may be able to eat one or two servings of foods with a little more than 140 milligrams (mg) of sodium if you are closely watching how much sodium you eat in a day.  Check the serving size on the label. The amount of sodium listed on the label shows the amount in one serving of the food. So, if you eat more than one serving, you will get more sodium than the amount listed.  Tips Cutting Back on Sodium Eat more fresh foods.  Fresh fruits and vegetables are low in sodium, as well as frozen vegetables and fruits that have no added juices or sauces.  Fresh meats are lower in sodium than processed meats, such as bacon, sausage, and hotdogs.  Not all processed foods are unhealthy, but some processed foods may have too  much sodium.  Eat less salt at the table and when cooking. One of the ingredients in salt is sodium.  One teaspoon of table salt has 2,300 milligrams of sodium.  Leave the salt out of recipes for pasta, casseroles, and soups. Be a Paramedic.  Food packages that say "Salt-free", sodium-free", "very low sodium," and "low sodium" have less than 140 milligrams of sodium per serving.  Beware of products identified as "Unsalted," "No Salt Added," "Reduced Sodium," or "Lower Sodium." These items may still be high in sodium. You should always check the nutrition label. Add flavors to your food without adding sodium.  Try lemon juice, lime juice, or vinegar.  Dry or fresh herbs add flavor.  Buy a sodium-free seasoning blend or make your own at home. You can purchase salt-free or sodium-free condiments like barbeque sauce in stores and online. Ask your registered dietitian nutritionist for recommendations and where to find them.   Eating in Restaurants Choose foods carefully when you eat outside your home. Restaurant foods can be very high in sodium. Many restaurants provide nutrition facts on their menus or their websites. If you cannot find that information, ask your server. Let your server know that you want your food to be cooked without salt and that you would like your salad dressing and sauces to be served on the  side.    Foods Recommended Food Group Foods Recommended  Grains Bread, bagels, rolls without salted tops Homemade bread made with reduced-sodium baking powder Cold cereals, especially shredded wheat and puffed rice Oats, grits, or cream of wheat Pastas, quinoa, and rice Popcorn, pretzels or crackers without salt Corn tortillas  Protein Foods Fresh meats and fish; [place] bacon (check the nutrition labels - make sure they are not packaged in a sodium solution) Canned or packed tuna (no more than 4 ounces at 1 serving) Beans and peas Soybeans) and tofu Eggs Nuts or nut butters  without salt  Dairy Milk or milk powder Plant milks, such as rice and soy Yogurt, including Greek yogurt Small amounts of natural cheese (blocks of cheese) or reduced-sodium cheese can be used in moderation. (Swiss, ricotta, and fresh mozzarella cheese are lower in sodium than the others) Cream Cheese Low sodium cottage cheese  Vegetables Fresh and frozen vegetables without added sauces or salt Homemade soups (without salt) Low-sodium, salt-free or sodium-free canned vegetables and soups  Fruit Fresh and canned fruits Dried fruits, such as raisins, cranberries, and prunes  Oils Tub or liquid margarine, regular or without salt Canola, corn, peanut, olive, safflower, or sunflower oils  Condiments Fresh or dried herbs such as basil, bay leaf, dill, mustard (dry), nutmeg, paprika, parsley, rosemary, sage, or thyme.  Low sodium ketchup Vinegar  Lemon or lime juice Pepper, red pepper flakes, and cayenne. Hot sauce contains sodium, but if you use just a drop or two, it will not add up to much.  Salt-free or sodium-free seasoning mixes and marinades Simple salad dressings: vinegar and oil   Foods Not Recommended Food Group Foods Not Recommended  Grains Breads or crackers topped with salt Cereals (hot/cold) with more than 300 mg sodium per serving Biscuits, cornbread, and other "quick" breads prepared with baking soda Pre-packaged bread crumbs Seasoned and packaged rice and pasta mixes Self-rising flours  Protein Foods Cured meats: Bacon, ham, sausage, pepperoni and hot dogs Canned meats (chili, vienna sausage, or sardines) Smoked fish and meats Frozen meals that have more than 600 mg of sodium per serving Egg substitute (with added sodium)  Dairy Buttermilk Processed cheese spreads Cottage cheese (1 cup may have over 500 mg of sodium; look for low-sodium.) American or feta cheese Shredded Cheese has more sodium than blocks of cheese String cheese  Vegetables Canned vegetables  (unless they are salt-free, sodium-free or low sodium) Frozen vegetables with seasoning and sauces Sauerkraut and pickled vegetables Canned or dried soups (unless they are salt-free, sodium-free, or low sodium) [place] fries and onion rings  Fruit Dried fruits preserved with additives that have sodium  Oils Salted butter or margarine, all types of olives  Condiments Salt, sea salt, kosher salt, onion salt, and garlic salt Seasoning mixes with salt Bouillon cubes Ketchup Barbeque sauce and Worcestershire sauce unless low sodium Soy sauce Salsa, pickles, olives, relish Salad dressings: ranch, blue cheese, [place], and [place].   Low Sodium Sample 1-Day Menu  Breakfast 1 cup cooked oatmeal  1 slice whole wheat bread toast  1 tablespoon peanut butter without salt  1 banana  1 cup 1% milk  Lunch Tacos made with: 2 corn tortillas   cup black beans, low sodium   cup roasted or grilled chicken (without skin)   avocado  Squeeze of lime juice  1 cup salad greens  1 tablespoon low-sodium salad dressing   cup strawberries  1 orange  Afternoon Snack 1/3 cup grapes  6 ounces yogurt  Evening Meal 3 ounces herb-baked fish  1 baked potato  2 teaspoons olive oil   cup cooked carrots  2 thick slices tomatoes on:  2 lettuce leaves  1 teaspoon olive oil  1 teaspoon balsamic vinegar  1 cup 1% milk  Evening Snack 1 apple   cup almonds without salt   Low-Sodium Vegetarian (Lacto-Ovo) Sample 1-Day Menu  Breakfast 1 cup cooked oatmeal  1 slice whole wheat toast  1 tablespoon peanut butter without salt  1 banana  1 cup 1% milk  Lunch Tacos made with: 2 corn tortillas   cup black beans, low sodium   cup roasted or grilled chicken (without skin)   avocado  Squeeze of lime juice  1 cup salad greens  1 tablespoon low-sodium salad dressing   cup strawberries  1 orange  Evening Meal Stir fry made with:  cup tofu  1 cup brown rice   cup broccoli   cup green beans   cup  peppers   tablespoon peanut oil  1 orange  1 cup 1% milk  Evening Snack 4 strips celery  2 tablespoons hummus  1 hard-boiled egg   Low-Sodium Vegan Sample 1-Day Menu  Breakfast 1 cup cooked oatmeal  1 tablespoon peanut butter without salt  1 cup blueberries  1 cup soymilk fortified with calcium, vitamin B12, and vitamin D  Lunch 1 small whole wheat pita   cup cooked lentils  2 tablespoons hummus  4 carrot sticks  1 medium apple  1 cup soymilk fortified with calcium, vitamin B12, and vitamin D  Evening Meal Stir fry made with:  cup tofu  1 cup brown rice   cup broccoli   cup green beans   cup peppers   tablespoon peanut oil  1 cup cantaloupe  Evening Snack 1 cup soy yogurt   cup mixed nuts  Copyright 2020  Academy [employer]. All rights reserved  Sodium Free Flavoring Tips  When cooking, the following items may be used for flavoring instead of salt or seasonings that contain sodium. Remember: A little bit of spice goes a long way! Be careful not to overseason. Spice Blend Recipe (makes about ? cup) 5 teaspoons onion powder  2 teaspoons garlic powder  2 teaspoons paprika  2 teaspoon dry mustard  1 teaspoon crushed thyme leaves   teaspoon white pepper   teaspoon celery seed Food Item Flavorings  Beef Basil, bay leaf, caraway, curry, dill, dry mustard, garlic, grape jelly, green pepper, mace, marjoram, mushrooms (fresh), nutmeg, onion or onion powder, parsley, pepper, rosemary, sage  Chicken Basil, cloves, cranberries, mace, mushrooms (fresh), nutmeg, oregano, paprika, parsley, pineapple, saffron, sage, savory, tarragon, thyme, tomato, turmeric  Egg Chervil, curry, dill, dry mustard, garlic or garlic powder, green pepper, jelly, mushrooms (fresh), nutmeg, onion powder, paprika, parsley, rosemary, tarragon, tomato  Fish Basil, bay leaf, chervil, curry, dill, dry mustard, green pepper, lemon juice, marjoram, mushrooms (fresh), paprika, pepper,  tarragon, tomato, turmeric  Lamb Cloves, curry, dill, garlic or garlic powder, mace, mint, mint jelly, onion, oregano, parsley, pineapple, rosemary, tarragon, thyme  Pork Applesauce, basil, caraway, chives, cloves, garlic or garlic powder, onion or onion powder, rosemary, thyme  Veal Apricots, basil, bay leaf, currant jelly, curry, ginger, marjoram, mushrooms (fresh), oregano, paprika  Vegetables Basil, dill, garlic or garlic powder, ginger, lemon juice, mace, marjoram, nutmeg, onion or onion powder, tarragon, tomato, sugar or sugar substitute, salt-free salad dressing, vinegar  Desserts Allspice, anise, cinnamon, cloves, ginger, mace, nutmeg, vanilla extract, other  extracts   Copyright 2020  Academy of [employer]. All rights reserved  Fluid Restricted Nutrition Therapy  You have been prescribed this diet because your condition affects how much fluid you can eat or drink. If your heart, liver, or kidneys aren't working properly, you may not be able to effectively eliminate fluids from the body and this may cause swelling (edema) in the legs, arms, and/or stomach. Drink no more than _________ liters or ________ ounces or ________cups of fluid per day.  You don't need to stop eating or drinking the same fluids you normally would, but you may need to eat or drink less than usual.  Your registered dietitian nutritionist will help you determine the correct amount of fluid to consume during the day Breakfast Include fluids taken with medications  Lunch Include fluids taken with medications  Dinner Include fluids taken with medications  Bedtime Snack Include fluids taken with medications     Tips What Are Fluids?  A fluid is anything that is liquid or anything that would melt if left at room temperature. You will need to count these foods and liquids--including any liquid used to take medication--as part of your daily fluid intake. Some examples are: Alcohol (drink only with your  doctor's permission)  Coffee, tea, and other hot beverages  Gelatin (Jell-O)  Gravy  Ice cream, sherbet, sorbet  Ice cubes, ice chips  Milk, liquid creamer  Nutritional supplements  Popsicles  Vegetable and fruit juices; fluid in canned fruit  Watermelon  Yogurt  Soft drinks, lemonade, limeade  Soups  Syrup How Do I Measure My Fluid Intake? Record your fluid intake daily.  Tip: Every day, each time you eat or drink fluids, pour water in the same amount into an empty container that can hold the same amount of fluids you are allowed daily. This may help you keep track of how much fluid you are taking in throughout the day.  To accurately keep track of how much liquid you take in, measure the size of the cups, glasses, and bowls you use. If you eat soup, measure how much of it is liquid and how much is solid (such as noodles, vegetables, meat). Conversions for Measuring Fluid Intake  Milliliters (mL) Liters (L) Ounces (oz) Cups (c)  1000 1 32 4  1200 1.2 40 5  1500 1.5 50 6 1/4  1800 1.8 60 7 1/2  2000 2 67 8 1/3  Tips to Reduce Your Thirst Chew gum or suck on hard candy.  Rinse or gargle with mouthwash. Do not swallow.  Ice chips or popsicles my help quench thirst, but this too needs to be calculated into the total restriction. Melt ice chips or cubes first to figure out how much fluid they produce (for example, experiment with melting  cup ice chips or 2 ice cubes).  Add a lemon wedge to your water.  Limit how much salt you take in. A high salt intake might make you thirstier.  Don't eat or drink all your allowed liquids at once. Space your liquids out through the day.  Use small glasses and cups and sip slowly. If allowed, take your medications with fluids you eat or drink during a meal.   Fluid-Restricted Nutrition Therapy Sample 1-Day Menu  Breakfast 1 slice wheat toast  1 tablespoon peanut butter  1/2 cup yogurt (120 milliliters)  1/2 cup blueberries  1 cup milk (240  milliliters)   Lunch 3 ounces sliced [place]  2 slices whole wheat  bread  1/2 cup lettuce for sandwich  2 slices tomato for sandwich  1 ounce reduced-fat, reduced-sodium cheese  1/2 cup fresh carrot sticks  1 banana  1 cup unsweetened tea (240 milliliters)   Evening Meal 8 ounces soup (240 milliliters)  3 ounces salmon  1/2 cup quinoa  1 cup green beans  1 cup mixed greens salad  1 tablespoon olive oil  1 cup coffee (240 milliliters)  Evening Snack 1/2 cup sliced peaches  1/2 cup frozen yogurt (120 milliliters)  1 cup water (240 milliliters)  Copyright 2020  Academy of [employer] Dietetics. All rights reserved    Additional Discharge Instructions   Please get your medications reviewed and adjusted by your Primary MD.  Please request your Primary MD to go over all Hospital Tests and Procedure/Radiological results at the follow up, please get all Hospital records sent to your Prim MD by signing hospital release before you go home.  If you had Pneumonia of Lung problems at the Hospital: Please get a 2 view Chest X ray done in approximately 4 weeks after hospital discharge or sooner if instructed by your Primary MD.  If you have Congestive Heart Failure: Please call your Cardiologist or Primary MD anytime you have any of the following symptoms:  1) 3 pound weight gain in 24 hours or 5 pounds in 1 week  2) shortness of breath, with or without a dry hacking cough  3) swelling in the hands, feet or stomach  4) if you have to sleep on extra pillows at night in order to breathe  Follow cardiac low salt diet and 1.5 lit/day fluid restriction.  If you have diabetes Accuchecks 4 times/day, Once in AM empty stomach and then before each meal. Log in all results and show them to your primary doctor at your next visit. If any glucose reading is under 80 or above 300 call your primary MD immediately.  If you have Seizure/Convulsions/Epilepsy: Please do not drive, operate heavy  machinery, participate in activities at heights or participate in high speed sports until you have seen by Primary MD or a Neurologist and advised to do so again. Per [facility] statutes, patients with seizures are not allowed to drive until they have been seizure-free for six months.  Use caution when using heavy equipment or power tools. Avoid working on ladders or at heights. Take showers instead of baths. Ensure the water temperature is not too high on the home water heater. Do not go swimming alone. Do not lock yourself in a room alone (i.e. bathroom). When caring for infants or small children, sit down when holding, feeding, or changing them to minimize risk of injury to the child in the event you have a seizure. Maintain good sleep hygiene. Avoid alcohol.   If you had Gastrointestinal Bleeding: Please ask your Primary MD to check a complete blood count within one week of discharge or at your next visit. Your endoscopic/colonoscopic biopsies that are pending at the time of discharge, will also need to followed by your Primary MD.  Get Medicines reviewed and adjusted. Please take all your medications with you for your next visit with your Primary MD  Please request your Primary MD to go over all hospital tests and procedure/radiological results at the follow up, please ask your Primary MD to get all Hospital records sent to his/her office.  If you experience worsening of your admission symptoms, develop shortness of breath, life threatening emergency, suicidal or homicidal thoughts you  must seek medical attention immediately by calling 911 or calling your MD immediately  if symptoms less severe.  You must read complete instructions/literature along with all the possible adverse reactions/side effects for all the Medicines you take and that have been prescribed to you. Take any new Medicines after you have completely understood and accpet all the possible adverse reactions/side effects.    Do not drive or operate heavy machinery when taking Pain medications.   Do not take more than prescribed Pain, Sleep and Anxiety Medications  Special Instructions: If you have smoked or chewed Tobacco  in the last 2 yrs please stop smoking, stop any regular Alcohol  and or any Recreational drug use.  Wear Seat belts while driving.  Please note You were cared for by a hospitalist during your hospital stay. If you have any questions about your discharge medications or the care you received while you were in the hospital after you are discharged, you can call the unit and asked to speak with the hospitalist on call if the hospitalist that took care of you is not available. Once you are discharged, your primary care physician will handle any further medical issues. Please note that NO REFILLS for any discharge medications will be authorized once you are discharged, as it is imperative that you return to your primary care physician (or establish a relationship with a primary care physician if you do not have one) for your aftercare needs so that they can reassess your need for medications and monitor your lab values.  You can reach the hospitalist office at phone [phone] or fax [phone]   If you do not have a primary care physician, you can call [phone] for a physician referral.

## 2022-06-03 NOTE — ED Notes (Signed)
Patient provided with ice per request. Patient resting in bed free from sign of distress. Breathing unlabored speaking in full sentences with symmetric chest rise and fall. Bed low and locked with side rails raised x2. Call bell in reach and monitor in place.

## 2022-06-03 NOTE — Progress Notes (Signed)
PROGRESS NOTE    Sheila Hays  DDU:[id number] DOB: 19-Oct-1970 DOA: 06/02/2022 PCP: [facility]   Brief Narrative:  52 year old with history of CHF with EF 60%, CKD stage IIIb, seizure, uncontrolled HTN, obesity, untreated OSA, severe pulmonary hypertension comes to the hospital with complaints of cough and shortness of breath.  Upon admission she was found to have influenza A infection and elevated BNP.  Chest x-ray was suggestive of possible left lower lobe pneumonia.   Assessment & Plan:  Principal Problem:   Influenza A with pneumonia Active Problems:   Acute respiratory failure with hypoxia (HCC)   CAP (community acquired pneumonia)   Acute on chronic systolic CHF (congestive heart failure) (HCC)   Elevated troponin   Hypertensive urgency   Obesity, Class III, BMI 40-49.9 (morbid obesity) (HCC)   Complex partial seizure disorder (HCC)   Pulmonary hypertension (HCC)   Stage 3b chronic kidney disease (HCC)   OSA (obstructive sleep apnea)     Assessment and Plan: * Influenza A with pneumonia Possible bacterial pneumonia COPD exacerbation Patient has been started on Tamiflu.  Supportive care.  I-S/flutter valve.  Nebulizers.  Procalcitonin negative, will discontinue antibiotics.  Underlying infection is causing COPD exacerbation therefore on steroids.  Acute respiratory failure with hypoxia (HCC) Multifactorial secondary to influenza A, COPD and CHF exacerbation.   Acute on chronic systolic CHF (congestive heart failure) (HCC) Hypertensive urgency Elevated troponin, demand ischemia History of cardiomyopathy, EF 25-30% in 2016>>LVEF 55-60% 10/2021 Suspect demand ischemia.  Troponins slightly trended up.  Will continue trending until it peaks.  This morning is 138.  Continue her home medications.  Supportive care. Lasix 40 mg IV twice daily Repeat echocardiogram Increase Coreg, Norvasc 10 mg added for now  OSA (obstructive sleep apnea) Not yet on  CPAP, follows outpatient pulmonary  Stage 3b chronic kidney disease (HCC) Renal function at baseline, 1.4  Pulmonary hypertension (HCC) Followed by pulmonology  Complex partial seizure disorder (HCC) Continue Lamictal and Trileptal  Obesity, Class III, BMI 40-49.9 (morbid obesity) (HCC) Complicating factor to overall prognosis and care    DVT prophylaxis: Lovenox Code Status: Full code Family Communication:    Status is: Inpatient Remains inpatient appropriate because: Remains inpatient for evaluation of elevated troponin, influenza A and abnormal breath sounds.   Subjective:  Patient tells me she still feels little short of breath especially with exertion this morning or when she lays down.  Denies any other complaints  Examination:  General exam: Appears calm and comfortable.  Obesity Respiratory system: Bibasilar expiratory wheezing Cardiovascular system: S1 & S2 heard, RRR. No JVD, murmurs, rubs, gallops or clicks. No pedal edema. Gastrointestinal system: Abdomen is nondistended, soft and nontender. No organomegaly or masses felt. Normal bowel sounds heard. Central nervous system: Alert and oriented. No focal neurological deficits. Extremities: Symmetric 5 x 5 power. Skin: No rashes, lesions or ulcers Psychiatry: Judgement and insight appear normal. Mood & affect appropriate.     Objective: Vitals:   06/02/22 2324 06/02/22 2330 06/03/22 0330 06/03/22 0337  BP: (!) 202/105 (!) 218/94 (!) 199/102   Pulse: 89 89 78   Resp: (!) 29 16 16    Temp:  98.7 F (37.1 C)  98.9 F (37.2 C)  TempSrc:  Oral  Oral  SpO2: 94% 97% 100%     Intake/Output Summary (Last 24 hours) at 06/03/2022 0841 Last data filed at 06/03/2022 0223 Gross per 24 hour  Intake 350 ml  Output --  Net 350 ml   There  were no vitals filed for this visit.   Data Reviewed:   CBC: Recent Labs  Lab 06/02/22 1448  WBC 4.7  HGB 11.9*  HCT 38.9  MCV 93.5  PLT 401   Basic Metabolic  Panel: Recent Labs  Lab 06/02/22 1448 06/03/22 0329  NA 139 137  K 3.8 3.6  CL 101 101  CO2 28 26  GLUCOSE 92 98  BUN 19 19  CREATININE 1.42* 1.41*  CALCIUM 8.9 8.7*   GFR: Estimated Creatinine Clearance: 60.1 mL/min (A) (by C-G formula based on SCr of 1.41 mg/dL (H)). Liver Function Tests: Recent Labs  Lab 06/02/22 1506  AST 30  ALT 23  ALKPHOS 82  BILITOT 0.8  PROT 7.6  ALBUMIN 3.7   No results for input(s): "LIPASE", "AMYLASE" in the last 168 hours. No results for input(s): "AMMONIA" in the last 168 hours. Coagulation Profile: No results for input(s): "INR", "PROTIME" in the last 168 hours. Cardiac Enzymes: No results for input(s): "CKTOTAL", "CKMB", "CKMBINDEX", "TROPONINI" in the last 168 hours. BNP (last 3 results) No results for input(s): "PROBNP" in the last 8760 hours. HbA1C: No results for input(s): "HGBA1C" in the last 72 hours. CBG: No results for input(s): "GLUCAP" in the last 168 hours. Lipid Profile: No results for input(s): "CHOL", "HDL", "LDLCALC", "TRIG", "CHOLHDL", "LDLDIRECT" in the last 72 hours. Thyroid Function Tests: No results for input(s): "TSH", "T4TOTAL", "FREET4", "T3FREE", "THYROIDAB" in the last 72 hours. Anemia Panel: No results for input(s): "VITAMINB12", "FOLATE", "FERRITIN", "TIBC", "IRON", "RETICCTPCT" in the last 72 hours. Sepsis Labs: Recent Labs  Lab 06/02/22 1946 06/03/22 0329  PROCALCITON <0.10  --   LATICACIDVEN 1.3 0.9    Recent Results (from the past 240 hour(s))  Resp panel by RT-PCR (RSV, Flu A&B, Covid) Anterior Nasal Swab     Status: Abnormal   Collection Time: 06/02/22  3:07 PM   Specimen: Anterior Nasal Swab  Result Value Ref Range Status   SARS Coronavirus 2 by RT PCR NEGATIVE NEGATIVE Final    Comment: (NOTE) SARS-CoV-2 target nucleic acids are NOT DETECTED.  The SARS-CoV-2 RNA is generally detectable in upper respiratory specimens during the acute phase of infection. The lowest concentration of  SARS-CoV-2 viral copies this assay can detect is 138 copies/mL. A negative result does not preclude SARS-Cov-2 infection and should not be used as the sole basis for treatment or other patient management decisions. A negative result may occur with  improper specimen collection/handling, submission of specimen other than nasopharyngeal swab, presence of viral mutation(s) within the areas targeted by this assay, and inadequate number of viral copies(<138 copies/mL). A negative result must be combined with clinical observations, patient history, and epidemiological information. The expected result is Negative.  Fact Sheet for Patients:  EntrepreneurPulse.com.au  Fact Sheet for Healthcare Providers:  IncredibleEmployment.be  This test is no t yet approved or cleared by the [place] FDA and  has been authorized for detection and/or diagnosis of SARS-CoV-2 by FDA under an Emergency Use Authorization (EUA). This EUA will remain  in effect (meaning this test can be used) for the duration of the COVID-19 declaration under Section 564(b)(1) of the Act, 21 U.S.C.section 360bbb-3(b)(1), unless the authorization is terminated  or revoked sooner.       Influenza A by PCR POSITIVE (A) NEGATIVE Final   Influenza B by PCR NEGATIVE NEGATIVE Final    Comment: (NOTE) The Xpert Xpress SARS-CoV-2/FLU/RSV plus assay is intended as an aid in the diagnosis of influenza from Nasopharyngeal swab  specimens and should not be used as a sole basis for treatment. Nasal washings and aspirates are unacceptable for Xpert Xpress SARS-CoV-2/FLU/RSV testing.  Fact Sheet for Patients: BloggerCourse.com  Fact Sheet for Healthcare Providers: SeriousBroker.it  This test is not yet approved or cleared by the [place] FDA and has been authorized for detection and/or diagnosis of SARS-CoV-2 by FDA under an Emergency Use  Authorization (EUA). This EUA will remain in effect (meaning this test can be used) for the duration of the COVID-19 declaration under Section 564(b)(1) of the Act, 21 U.S.C. section 360bbb-3(b)(1), unless the authorization is terminated or revoked.     Resp Syncytial Virus by PCR NEGATIVE NEGATIVE Final    Comment: (NOTE) Fact Sheet for Patients: BloggerCourse.com  Fact Sheet for Healthcare Providers: SeriousBroker.it  This test is not yet approved or cleared by the [place] FDA and has been authorized for detection and/or diagnosis of SARS-CoV-2 by FDA under an Emergency Use Authorization (EUA). This EUA will remain in effect (meaning this test can be used) for the duration of the COVID-19 declaration under Section 564(b)(1) of the Act, 21 U.S.C. section 360bbb-3(b)(1), unless the authorization is terminated or revoked.  Performed at [facility], [address] [place], [place] [address]          Radiology Studies: DG Chest 2 View  Result Date: 06/02/2022 CLINICAL DATA:  Shortness of breath EXAM: CHEST - 2 VIEW COMPARISON:  CXR 11/30/21 FINDINGS: No pleural effusion. No pneumothorax. There is a focal opacity at the right lung base, new from prior exam. Unchanged enlarged cardiac contours. No displaced rib fractures. Visualized upper abdomen is unremarkable. Vertebral body heights are maintained. IMPRESSION: Focal opacity at the right lung base, new from prior exam, and worrisome for infection. Electronically Signed   By: Lorenza Cambridge M.D.   On: 06/02/2022 15:21        Scheduled Meds:  aspirin EC  81 mg Oral Daily   carvedilol  6.25 mg Oral BID   enoxaparin (LOVENOX) injection  0.5 mg/kg Subcutaneous Q24H   furosemide  40 mg Intravenous BID   ipratropium-albuterol  3 mL Nebulization Q6H   lamoTRIgine  25 mg Oral BID   methylPREDNISolone (SOLU-MEDROL) injection  40 mg Intravenous Q12H   Followed by    Melene Muller ON 06/04/2022] predniSONE  40 mg Oral Q breakfast   oseltamivir  75 mg Oral BID   pantoprazole  40 mg Oral Daily   rosuvastatin  20 mg Oral QHS   Continuous Infusions:  azithromycin Stopped (06/03/22 0223)   cefTRIAXone (ROCEPHIN)  IV Stopped (06/02/22 2311)     LOS: 1 day   Time spent= 35 mins    Sunita Demond Joline Maxcy, MD [facility]  If 7PM-7AM, please contact night-coverage  06/03/2022, 8:41 AM

## 2022-06-03 NOTE — Plan of Care (Signed)
?  Problem: Education: ?Goal: Ability to demonstrate management of disease process will improve ?Outcome: Progressing ?Goal: Ability to verbalize understanding of medication therapies will improve ?Outcome: Progressing ?Goal: Individualized Educational Video(s) ?Outcome: Progressing ?  ?Problem: Activity: ?Goal: Capacity to carry out activities will improve ?Outcome: Progressing ?  ?Problem: Cardiac: ?Goal: Ability to achieve and maintain adequate cardiopulmonary perfusion will improve ?Outcome: Progressing ?  ?Problem: Education: ?Goal: Knowledge of disease or condition will improve ?Outcome: Progressing ?Goal: Knowledge of the prescribed therapeutic regimen will improve ?Outcome: Progressing ?Goal: Individualized Educational Video(s) ?Outcome: Progressing ?  ?Problem: Activity: ?Goal: Ability to tolerate increased activity will improve ?Outcome: Progressing ?Goal: Will verbalize the importance of balancing activity with adequate rest periods ?Outcome: Progressing ?  ?Problem: Respiratory: ?Goal: Ability to maintain a clear airway will improve ?Outcome: Progressing ?Goal: Levels of oxygenation will improve ?Outcome: Progressing ?Goal: Ability to maintain adequate ventilation will improve ?Outcome: Progressing ?  ?Problem: Activity: ?Goal: Ability to tolerate increased activity will improve ?Outcome: Progressing ?  ?Problem: Clinical Measurements: ?Goal: Ability to maintain a body temperature in the normal range will improve ?Outcome: Progressing ?  ?Problem: Respiratory: ?Goal: Ability to maintain adequate ventilation will improve ?Outcome: Progressing ?Goal: Ability to maintain a clear airway will improve ?Outcome: Progressing ?  ?Problem: Education: ?Goal: Knowledge of General Education information will improve ?Description: Including pain rating scale, medication(s)/side effects and non-pharmacologic comfort measures ?Outcome: Progressing ?  ?Problem: Health Behavior/Discharge Planning: ?Goal: Ability to manage  health-related needs will improve ?Outcome: Progressing ?  ?Problem: Clinical Measurements: ?Goal: Ability to maintain clinical measurements within normal limits will improve ?Outcome: Progressing ?Goal: Will remain free from infection ?Outcome: Progressing ?Goal: Diagnostic test results will improve ?Outcome: Progressing ?Goal: Respiratory complications will improve ?Outcome: Progressing ?Goal: Cardiovascular complication will be avoided ?Outcome: Progressing ?  ?Problem: Activity: ?Goal: Risk for activity intolerance will decrease ?Outcome: Progressing ?  ?Problem: Nutrition: ?Goal: Adequate nutrition will be maintained ?Outcome: Progressing ?  ?Problem: Coping: ?Goal: Level of anxiety will decrease ?Outcome: Progressing ?  ?Problem: Elimination: ?Goal: Will not experience complications related to bowel motility ?Outcome: Progressing ?Goal: Will not experience complications related to urinary retention ?Outcome: Progressing ?  ?Problem: Pain Managment: ?Goal: General experience of comfort will improve ?Outcome: Progressing ?  ?Problem: Safety: ?Goal: Ability to remain free from injury will improve ?Outcome: Progressing ?  ?Problem: Skin Integrity: ?Goal: Risk for impaired skin integrity will decrease ?Outcome: Progressing ?  ?

## 2022-06-03 NOTE — Progress Notes (Signed)
PT Cancellation Note  Patient Details Name: Sheila Hays MRN: [id number] DOB: [birth year]   Cancelled Treatment:    Reason Eval/Treat Not Completed: PT screened, no needs identified, will sign off. Pt is AMB in room ad lib, denies any acute difficulty with mobility, transfers, AMB, balance. Pt CC only of SOB, dyspnea. No acute skilled PT needs indicated at this time. Pt reports she has adequate social support as needed for transport, meals, meds. Encouraged pt to ask for additional FU should she notice any concerning changes in future.    2:33 PM, 06/03/22 Etta Grandchild, PT, DPT Physical Therapist - [facility]  [phone] ([place])    [place] C 06/03/2022, 2:31 PM

## 2022-06-03 NOTE — Telephone Encounter (Signed)
Late Entry: The patient was seen by Cadence Furth, PA on 05/30/22 and referred to Nephrology for CKD.   Cadence's last office note/ demographics/ lab work faxed to [employer] at [phone]. Confirmation received.

## 2022-06-03 NOTE — Progress Notes (Signed)
*  PRELIMINARY RESULTS* Echocardiogram 2D Echocardiogram has been performed.  Elpidio Anis 06/03/2022, 4:22 PM

## 2022-06-04 DIAGNOSIS — J09X1 Influenza due to identified novel influenza A virus with pneumonia: Secondary | ICD-10-CM | POA: Diagnosis not present

## 2022-06-04 LAB — ECHOCARDIOGRAM COMPLETE
AR max vel: 2.44 cm2
AV Area VTI: 2.55 cm2
AV Area mean vel: 2.35 cm2
AV Mean grad: 6 mmHg
AV Peak grad: 9.2 mmHg
Ao pk vel: 1.52 m/s
Area-P 1/2: 4.89 cm2
Height: 65 in
MV VTI: 2.95 cm2
S' Lateral: 4.2 cm
Weight: 4261.05 oz

## 2022-06-04 LAB — BASIC METABOLIC PANEL
Anion gap: 9 (ref 5–15)
BUN: 29 mg/dL — ABNORMAL HIGH (ref 6–20)
CO2: 31 mmol/L (ref 22–32)
Calcium: 8.9 mg/dL (ref 8.9–10.3)
Chloride: 99 mmol/L (ref 98–111)
Creatinine, Ser: 1.64 mg/dL — ABNORMAL HIGH (ref 0.44–1.00)
GFR, Estimated: 37 mL/min — ABNORMAL LOW (ref >60)
Glucose, Bld: 118 mg/dL — ABNORMAL HIGH (ref 70–99)
Potassium: 3.4 mmol/L — ABNORMAL LOW (ref 3.5–5.1)
Sodium: 139 mmol/L (ref 135–145)

## 2022-06-04 LAB — MAGNESIUM: Magnesium: 2.1 mg/dL (ref 1.7–2.4)

## 2022-06-04 MED ORDER — IPRATROPIUM-ALBUTEROL 0.5-2.5 (3) MG/3ML IN SOLN
3.0000 mL | Freq: Three times a day (TID) | RESPIRATORY_TRACT | Status: DC
  Administered 2022-06-04 (×2): 3 mL via RESPIRATORY_TRACT
  Filled 2022-06-04 (×2): qty 3

## 2022-06-04 MED ORDER — GUAIFENESIN ER 600 MG PO TB12
1200.0000 mg | ORAL_TABLET | Freq: Two times a day (BID) | ORAL | Status: DC
  Administered 2022-06-04 – 2022-06-07 (×7): 1200 mg via ORAL
  Filled 2022-06-04 (×7): qty 2

## 2022-06-04 MED ORDER — OSELTAMIVIR PHOSPHATE 30 MG PO CAPS
30.0000 mg | ORAL_CAPSULE | Freq: Two times a day (BID) | ORAL | Status: AC
  Administered 2022-06-04 – 2022-06-07 (×6): 30 mg via ORAL
  Filled 2022-06-04 (×6): qty 1

## 2022-06-04 MED ORDER — FUROSEMIDE 10 MG/ML IJ SOLN: 40.0000 mg | Freq: Two times a day (BID) | INTRAMUSCULAR | Status: DC

## 2022-06-04 MED ORDER — MENTHOL 3 MG MT LOZG
1.0000 | LOZENGE | OROMUCOSAL | Status: DC | PRN
  Administered 2022-06-04: 3 mg via ORAL
  Filled 2022-06-04 (×2): qty 9

## 2022-06-04 MED ORDER — POTASSIUM CHLORIDE CRYS ER 20 MEQ PO TBCR
40.0000 meq | EXTENDED_RELEASE_TABLET | Freq: Once | ORAL | Status: AC
  Administered 2022-06-04: 40 meq via ORAL
  Filled 2022-06-04: qty 2

## 2022-06-04 MED ORDER — PHENOL 1.4 % MT LIQD: 1.0000 | OROMUCOSAL | Status: DC | PRN

## 2022-06-04 MED ORDER — BENZONATATE 100 MG PO CAPS
200.0000 mg | ORAL_CAPSULE | Freq: Three times a day (TID) | ORAL | Status: DC
  Administered 2022-06-04 – 2022-06-07 (×10): 200 mg via ORAL
  Filled 2022-06-04 (×10): qty 2

## 2022-06-04 MED ORDER — GUAIFENESIN 100 MG/5ML PO LIQD
5.0000 mL | ORAL | Status: DC | PRN
  Administered 2022-06-04 – 2022-06-06 (×5): 5 mL via ORAL
  Filled 2022-06-04 (×5): qty 10

## 2022-06-04 NOTE — Plan of Care (Signed)
  Problem: Education: Goal: Ability to verbalize understanding of medication therapies will improve Outcome: Progressing   Problem: Education: Goal: Knowledge of disease or condition will improve Outcome: Progressing   Problem: Respiratory: Goal: Ability to maintain a clear airway will improve Outcome: Progressing   Problem: Clinical Measurements: Goal: Ability to maintain a body temperature in the normal range will improve Outcome: Progressing   Problem: Health Behavior/Discharge Planning: Goal: Ability to manage health-related needs will improve Outcome: Progressing   Problem: Clinical Measurements: Goal: Respiratory complications will improve Outcome: Progressing   Problem: Elimination: Goal: Will not experience complications related to bowel motility Outcome: Progressing   Problem: Skin Integrity: Goal: Risk for impaired skin integrity will decrease Outcome: Progressing

## 2022-06-04 NOTE — Progress Notes (Signed)
PROGRESS NOTE   Sheila Hays  YIF:[id number]    DOB: 1971-05-18    DOA: 06/02/2022  PCP: [facility]   I have briefly reviewed patients previous medical records in [facility].    Brief Narrative:  52 year old female, lives with her family, independent, PMH of chronic diastolic CHF, reported COPD, not on home oxygen, former tobacco use (quit 20 years ago), CKD stage IIIb, seizure, HTN, morbid obesity, untreated OSA, severe pulmonary hypertension, presented to the ED on 06/02/2022 with complaints of cough and dyspnea.  She was found to have influenza A.  Chest x-ray was suggestive of possible left lower lobe pneumonia.  Treating for acute bronchitis secondary to influenza A complicated by COPD exacerbation.  No clinical CHF at this time.   Assessment & Plan:  Principal Problem:   Influenza A with pneumonia Active Problems:   Acute respiratory failure with hypoxia (HCC)   CAP (community acquired pneumonia)   Acute on chronic systolic CHF (congestive heart failure) (HCC)   Elevated troponin   Hypertensive urgency   Obesity, Class III, BMI 40-49.9 (morbid obesity) (HCC)   Complex partial seizure disorder (HCC)   Pulmonary hypertension (HCC)   Stage 3b chronic kidney disease (HCC)   OSA (obstructive sleep apnea)   Influenza A acute bronchitis with COPD exacerbation: Procalcitonin negative, antibiotics discontinued.  Complete course of Tamiflu.  Ongoing wheezing, continue oral prednisone.  Incentive spirometry and flutter valve.  Added Mucinex.  Bronchodilator nebs and supportive care.  Bacterial pneumonia ruled out.  Recommend repeating chest x-ray in 4 weeks.  Acute respiratory failure with hypoxia:  Multifactorial due to acute bronchitis, COPD exacerbation, underlying untreated OSA, pulmonary hypertension and probable CHF exacerbation earlier.  Wean off O2 as tolerated.  Acute on chronic diastolic CHF: History of cardiomyopathy.  LVEF 25-30% in 2016.  LVEF  55-60% in June 2023.  Repeat echo 1/9: LVEF 50-55% with grade 2 diastolic dysfunction.  Was treated with IV Lasix since admission.  Clinically volume status difficult to accurately estimate but appears euvolemic.  Moreover creatinine has been trending up, 1.64 today.  Discontinued Lasix.  Follow clinically.  Hypertensive urgency: Ongoing elevated BPs, 189/97 this morning, but prior to this.  Continue amlodipine 10 Mg daily, carvedilol 12.5 Mg twice daily.  As needed IV hydralazine.  If persistently high, may have to add a third agent.  Hypokalemia: Replace and follow.  Normocytic anemia: Outpatient follow-up.  OSA (obstructive sleep apnea) Not yet on CPAP, follows outpatient pulmonary   Stage 3b chronic kidney disease (HCC) Renal function at baseline, 1.4.  Creatinine has bumped up to 1.6.  Holding diuretics for today.  Reassess BMP in AM.   Pulmonary hypertension (HCC) Followed by pulmonology   Complex partial seizure disorder (HCC) Continue Lamictal and Trileptal  Body mass index is 44.32 kg/m./Complicating factor to overall prognosis and care   DVT prophylaxis:   Lovenox   Code Status: Full Code:  Family Communication: None at bedside Disposition:  Status is: Inpatient Remains inpatient appropriate because: Dizziness, lightheadedness on ambulation, cough.  Clinical bronchospasm.     Consultants:     Procedures:     Antimicrobials:      Subjective:  Reports having some dizziness and lightheadedness on ambulating to the bathroom this morning.  Feels like she has some phlegm but unable to bring it up.  Still having episodes of hacking cough.  Chest pain only on coughing.  Some dyspnea during coughing.  Objective:   Vitals:   06/03/22  1523 06/03/22 2350 06/04/22 0455 06/04/22 0744  BP: (!) 176/73 (!) 176/91  (!) 189/97  Pulse: 94 75  80  Resp: 18 17  18   Temp: 99.5 F (37.5 C) 98.9 F (37.2 C)  99.8 F (37.7 C)  TempSrc:      SpO2: 96% 100%  94%  Weight:    120.8 kg   Height:        General exam: Young female, moderately built and very morbidly obese lying comfortably propped up in bed without distress. Respiratory system: Reduced breath sounds laterally with scattered bilateral few medium pitched expiratory rhonchi.  No crackles.  No increased work of breathing. Cardiovascular system: S1 & S2 heard, RRR. No JVD, murmurs, rubs, gallops or clicks. No pedal edema. Gastrointestinal system: Abdomen is nondistended, soft and nontender. No organomegaly or masses felt. Normal bowel sounds heard. Central nervous system: Alert and oriented. No focal neurological deficits. Extremities: Symmetric 5 x 5 power. Skin: No rashes, lesions or ulcers Psychiatry: Judgement and insight appear normal. Mood & affect appropriate.     Data Reviewed:   I have personally reviewed following labs and imaging studies   CBC: Recent Labs  Lab 06/02/22 1448  WBC 4.7  HGB 11.9*  HCT 38.9  MCV 93.5  PLT 497    Basic Metabolic Panel: Recent Labs  Lab 06/02/22 1448 06/03/22 0329 06/04/22 0301  NA 139 137 139  K 3.8 3.6 3.4*  CL 101 101 99  CO2 28 26 31   GLUCOSE 92 98 118*  BUN 19 19 29*  CREATININE 1.42* 1.41* 1.64*  CALCIUM 8.9 8.7* 8.9  MG  --   --  2.1    Liver Function Tests: Recent Labs  Lab 06/02/22 1506  AST 30  ALT 23  ALKPHOS 82  BILITOT 0.8  PROT 7.6  ALBUMIN 3.7    CBG: No results for input(s): "GLUCAP" in the last 168 hours.  Microbiology Studies:   Recent Results (from the past 240 hour(s))  Resp panel by RT-PCR (RSV, Flu A&B, Covid) Anterior Nasal Swab     Status: Abnormal   Collection Time: 06/02/22  3:07 PM   Specimen: Anterior Nasal Swab  Result Value Ref Range Status   SARS Coronavirus 2 by RT PCR NEGATIVE NEGATIVE Final    Comment: (NOTE) SARS-CoV-2 target nucleic acids are NOT DETECTED.  The SARS-CoV-2 RNA is generally detectable in upper respiratory specimens during the acute phase of infection. The  lowest concentration of SARS-CoV-2 viral copies this assay can detect is 138 copies/mL. A negative result does not preclude SARS-Cov-2 infection and should not be used as the sole basis for treatment or other patient management decisions. A negative result may occur with  improper specimen collection/handling, submission of specimen other than nasopharyngeal swab, presence of viral mutation(s) within the areas targeted by this assay, and inadequate number of viral copies(<138 copies/mL). A negative result must be combined with clinical observations, patient history, and epidemiological information. The expected result is Negative.  Fact Sheet for Patients:  EntrepreneurPulse.com.au  Fact Sheet for Healthcare Providers:  IncredibleEmployment.be  This test is no t yet approved or cleared by the [place] FDA and  has been authorized for detection and/or diagnosis of SARS-CoV-2 by FDA under an Emergency Use Authorization (EUA). This EUA will remain  in effect (meaning this test can be used) for the duration of the COVID-19 declaration under Section 564(b)(1) of the Act, 21 U.S.C.section 360bbb-3(b)(1), unless the authorization is terminated  or revoked sooner.  Influenza A by PCR POSITIVE (A) NEGATIVE Final   Influenza B by PCR NEGATIVE NEGATIVE Final    Comment: (NOTE) The Xpert Xpress SARS-CoV-2/FLU/RSV plus assay is intended as an aid in the diagnosis of influenza from Nasopharyngeal swab specimens and should not be used as a sole basis for treatment. Nasal washings and aspirates are unacceptable for Xpert Xpress SARS-CoV-2/FLU/RSV testing.  Fact Sheet for Patients: BloggerCourse.com  Fact Sheet for Healthcare Providers: SeriousBroker.it  This test is not yet approved or cleared by the [place] FDA and has been authorized for detection and/or diagnosis of SARS-CoV-2 by FDA  under an Emergency Use Authorization (EUA). This EUA will remain in effect (meaning this test can be used) for the duration of the COVID-19 declaration under Section 564(b)(1) of the Act, 21 U.S.C. section 360bbb-3(b)(1), unless the authorization is terminated or revoked.     Resp Syncytial Virus by PCR NEGATIVE NEGATIVE Final    Comment: (NOTE) Fact Sheet for Patients: BloggerCourse.com  Fact Sheet for Healthcare Providers: SeriousBroker.it  This test is not yet approved or cleared by the [place] FDA and has been authorized for detection and/or diagnosis of SARS-CoV-2 by FDA under an Emergency Use Authorization (EUA). This EUA will remain in effect (meaning this test can be used) for the duration of the COVID-19 declaration under Section 564(b)(1) of the Act, 21 U.S.C. section 360bbb-3(b)(1), unless the authorization is terminated or revoked.  Performed at [facility], [address] [place], [place] [address]   Blood culture (routine x 2)     Status: None (Preliminary result)   Collection Time: 06/02/22  7:46 PM   Specimen: BLOOD RIGHT ARM  Result Value Ref Range Status   Specimen Description BLOOD RIGHT ARM  Final   Special Requests   Final    BOTTLES DRAWN AEROBIC AND ANAEROBIC Blood Culture adequate volume   Culture   Final    NO GROWTH 2 DAYS Performed at [facility], [address] [place], [place] [address]    Report Status PENDING  Incomplete  Blood culture (routine x 2)     Status: None (Preliminary result)   Collection Time: 06/03/22  3:29 AM   Specimen: BLOOD  Result Value Ref Range Status   Specimen Description BLOOD RIGHT HAND  Final   Special Requests   Final    BOTTLES DRAWN AEROBIC AND ANAEROBIC Blood Culture results may not be optimal due to an inadequate volume of blood received in culture bottles   Culture   Final    NO GROWTH 1 DAY Performed at [facility], [address] [place], [place] [address]    Report Status PENDING  Incomplete    Radiology Studies:  ECHOCARDIOGRAM COMPLETE  Result Date: 06/04/2022    ECHOCARDIOGRAM REPORT   Patient Name:   [facility] Date of Exam: 06/03/2022 Medical Rec #:  [id number]          Height:       65.0 in Accession #:    [id number]         Weight:       266.3 lb Date of Birth:  1970-12-15          BSA:          2.234 m Patient Age:    51 years           BP:           179/91 mmHg Patient Gender: F  HR:           89 bpm. Exam Location:  [facility] Procedure: 2D Echo, Cardiac Doppler and Color Doppler Indications:     CHF  History:         Patient has prior history of Echocardiogram examinations, most                  recent 11/21/2021. CHF, Signs/Symptoms:Chest Pain and                  Dizziness/Lightheadedness; Risk Factors:Hypertension. Flu +.  Sonographer:     Wenda Low Referring Phys:  [id number] Athena Masse Diagnosing Phys: Nelva Bush MD  Sonographer Comments: Patient is obese. Image acquisition challenging due to respiratory motion. IMPRESSIONS  1. Left ventricular ejection fraction, by estimation, is 50 to 55%. The left ventricle has low normal function. The left ventricle has no regional wall motion abnormalities. The left ventricular internal cavity size was mildly to moderately dilated. There is moderate left ventricular hypertrophy. Left ventricular diastolic parameters are consistent with Grade II diastolic dysfunction (pseudonormalization). Elevated left atrial pressure.  2. Right ventricular systolic function is normal. The right ventricular size is normal.  3. Left atrial size was mildly dilated.  4. Right atrial size was mildly dilated.  5. The mitral valve is degenerative. Trivial mitral valve regurgitation.  6. The aortic valve is tricuspid. Aortic valve regurgitation is not visualized. No aortic stenosis is present.  7. The inferior vena cava is normal in size with greater than 50%  respiratory variability, suggesting right atrial pressure of 3 mmHg. FINDINGS  Left Ventricle: Left ventricular ejection fraction, by estimation, is 50 to 55%. The left ventricle has low normal function. The left ventricle has no regional wall motion abnormalities. The left ventricular internal cavity size was mildly to moderately  dilated. There is moderate left ventricular hypertrophy. Left ventricular diastolic parameters are consistent with Grade II diastolic dysfunction (pseudonormalization). Elevated left atrial pressure. Right Ventricle: The right ventricular size is normal. No increase in right ventricular wall thickness. Right ventricular systolic function is normal. Left Atrium: Left atrial size was mildly dilated. Right Atrium: Right atrial size was mildly dilated. Pericardium: Trivial pericardial effusion is present. Mitral Valve: The mitral valve is degenerative in appearance. There is mild thickening of the mitral valve leaflet(s). Mild mitral annular calcification. Trivial mitral valve regurgitation. MV peak gradient, 9.4 mmHg. The mean mitral valve gradient is 4.0 mmHg. Tricuspid Valve: The tricuspid valve is not well visualized. Tricuspid valve regurgitation is not demonstrated. Aortic Valve: The aortic valve is tricuspid. Aortic valve regurgitation is not visualized. No aortic stenosis is present. Aortic valve mean gradient measures 6.0 mmHg. Aortic valve peak gradient measures 9.2 mmHg. Aortic valve area, by VTI measures 2.55 cm. Pulmonic Valve: The pulmonic valve was normal in structure. Pulmonic valve regurgitation is not visualized. No evidence of pulmonic stenosis. Aorta: The aortic root is normal in size and structure. Pulmonary Artery: The pulmonary artery is not well seen. Venous: The inferior vena cava is normal in size with greater than 50% respiratory variability, suggesting right atrial pressure of 3 mmHg. IAS/Shunts: The interatrial septum was not well visualized.  LEFT VENTRICLE PLAX  2D LVIDd:         5.70 cm   Diastology LVIDs:         4.20 cm   LV e' lateral:   6.96 cm/s LV PW:         1.50 cm   LV E/e' lateral: 18.5  LV IVS:        1.30 cm LVOT diam:     2.10 cm LV SV:         82 LV SV Index:   37 LVOT Area:     3.46 cm  RIGHT VENTRICLE RV Basal diam:  3.45 cm RV Mid diam:    3.70 cm RV S prime:     17.50 cm/s TAPSE (M-mode): 2.9 cm LEFT ATRIUM             Index        RIGHT ATRIUM           Index LA diam:        5.30 cm 2.37 cm/m   RA Area:     17.80 cm LA Vol (A2C):   68.5 ml 30.66 ml/m  RA Volume:   43.60 ml  19.52 ml/m LA Vol (A4C):   62.1 ml 27.80 ml/m LA Biplane Vol: 68.9 ml 30.84 ml/m  AORTIC VALVE                     PULMONIC VALVE AV Area (Vmax):    2.44 cm      PV Vmax:       1.19 m/s AV Area (Vmean):   2.35 cm      PV Peak grad:  5.7 mmHg AV Area (VTI):     2.55 cm AV Vmax:           152.00 cm/s AV Vmean:          112.000 cm/s AV VTI:            0.320 m AV Peak Grad:      9.2 mmHg AV Mean Grad:      6.0 mmHg LVOT Vmax:         107.00 cm/s LVOT Vmean:        76.000 cm/s LVOT VTI:          0.236 m LVOT/AV VTI ratio: 0.74  AORTA Ao Root diam: 3.20 cm MITRAL VALVE MV Area (PHT): 4.89 cm     SHUNTS MV Area VTI:   2.95 cm     Systemic VTI:  0.24 m MV Peak grad:  9.4 mmHg     Systemic Diam: 2.10 cm MV Mean grad:  4.0 mmHg MV Vmax:       1.53 m/s MV Vmean:      84.3 cm/s MV Decel Time: 155 msec MV E velocity: 129.00 cm/s MV A velocity: 114.00 cm/s MV E/A ratio:  1.13 Yvonne Kendall MD Electronically signed by Yvonne Kendall MD Signature Date/Time: 06/04/2022/7:08:53 AM    Final    DG Chest 2 View  Result Date: 06/02/2022 CLINICAL DATA:  Shortness of breath EXAM: CHEST - 2 VIEW COMPARISON:  CXR 11/30/21 FINDINGS: No pleural effusion. No pneumothorax. There is a focal opacity at the right lung base, new from prior exam. Unchanged enlarged cardiac contours. No displaced rib fractures. Visualized upper abdomen is unremarkable. Vertebral body heights are maintained. IMPRESSION:  Focal opacity at the right lung base, new from prior exam, and worrisome for infection. Electronically Signed   By: Lorenza Cambridge M.D.   On: 06/02/2022 15:21    Scheduled Meds:    amLODipine  10 mg Oral Daily   aspirin EC  81 mg Oral Daily   budesonide (PULMICORT) nebulizer solution  0.5 mg Nebulization BID   carvedilol  12.5 mg Oral BID   enoxaparin (LOVENOX) injection  0.5 mg/kg  Subcutaneous Q24H   [START ON 06/05/2022] furosemide  40 mg Intravenous BID   ipratropium-albuterol  3 mL Nebulization TID   lamoTRIgine  25 mg Oral BID   linaclotide  145 mcg Oral QAC breakfast   montelukast  10 mg Oral Daily   oseltamivir  75 mg Oral BID   oxcarbazepine  600 mg Oral BID   pantoprazole  40 mg Oral Daily   predniSONE  40 mg Oral Q breakfast   rosuvastatin  20 mg Oral QHS    Continuous Infusions:     LOS: 2 days     Marcellus Scott, MD,  FACP, FHM, SFHM, [facility], [facility]   [facility] [place]     To contact the attending provider between 7A-7P or the covering provider during after hours 7P-7A, please log into the web site [email or web address] and access using universal [place] password for that web site. If you do not have the password, please call the hospital operator.  06/04/2022, 10:47 AM

## 2022-06-04 NOTE — Consult Note (Signed)
   Heart Failure Nurse Navigator Note  HFpEF 50 to 55%.  Left ventricular internal cavity is mild to moderately dilated.  Moderate LVH.  Grade 2 diastolic dysfunction.  She presented to the emergency room with complaints of cough and shortness of breath, not improving with home bronchodilators.  Also noted fever and lower extremity edema.  Pressures were 180/94.  BNP was 1073.  Comorbidities:  Chronic kidney disease stage III Seizures Hypertension Obesity Obstructive sleep apnea Severe pulmonary hypertension  Medications:  Amlodipine 10 mg daily Aspirin 81 mg daily Carvedilol 12.5 mg 2 times a day Crestor 20 mg daily at bedtime  Furosemide has been discontinued.  Labs:  Sodium 139, potassium 3.4, chloride 99, CO2 31, BUN 29, creatinine 1.64 estimated GFR 37, down from 45 the day before. Weight 120.8 kg Intake 250 mL Output not documented   Initial meeting with patient who was lying in bed in no acute distress.  She is currently on room air.  Discussed how she takes care of herself at home.  She states that she weighs herself daily-states weight is usually 263#.  She prepares her own meals, does not use salt, uses Mrs. Dash and black pepper.  But states there are times that food just taste bland.  Discussed using other spices, vinegar and lemon juice.  She does not eat at restaurants.  She does not stick with a fluid restriction "due to being thirsty all the time."  Went over things she can do to combat the thirst like sucking on hard candy, chewing gum etc, she states she does that and it does not help--she is still thirsty.  She no showed to her last HF clinic appointment.  Made aware of follow up on January  19 at 8:30 AM.  21% no show for appointments.  Pricilla Riffle RN [facility] She has no further questions.

## 2022-06-05 ENCOUNTER — Inpatient Hospital Stay: Payer: 59

## 2022-06-05 DIAGNOSIS — R0902 Hypoxemia: Secondary | ICD-10-CM | POA: Diagnosis not present

## 2022-06-05 DIAGNOSIS — J441 Chronic obstructive pulmonary disease with (acute) exacerbation: Secondary | ICD-10-CM

## 2022-06-05 DIAGNOSIS — R7989 Other specified abnormal findings of blood chemistry: Secondary | ICD-10-CM | POA: Diagnosis not present

## 2022-06-05 DIAGNOSIS — I509 Heart failure, unspecified: Secondary | ICD-10-CM

## 2022-06-05 DIAGNOSIS — I272 Pulmonary hypertension, unspecified: Secondary | ICD-10-CM

## 2022-06-05 DIAGNOSIS — R079 Chest pain, unspecified: Secondary | ICD-10-CM | POA: Diagnosis not present

## 2022-06-05 DIAGNOSIS — J9601 Acute respiratory failure with hypoxia: Secondary | ICD-10-CM

## 2022-06-05 DIAGNOSIS — J09X1 Influenza due to identified novel influenza A virus with pneumonia: Secondary | ICD-10-CM | POA: Diagnosis not present

## 2022-06-05 DIAGNOSIS — N1832 Chronic kidney disease, stage 3b: Secondary | ICD-10-CM

## 2022-06-05 DIAGNOSIS — J101 Influenza due to other identified influenza virus with other respiratory manifestations: Secondary | ICD-10-CM

## 2022-06-05 LAB — BASIC METABOLIC PANEL
Anion gap: 11 (ref 5–15)
BUN: 33 mg/dL — ABNORMAL HIGH (ref 6–20)
CO2: 28 mmol/L (ref 22–32)
Calcium: 8.5 mg/dL — ABNORMAL LOW (ref 8.9–10.3)
Chloride: 100 mmol/L (ref 98–111)
Creatinine, Ser: 1.63 mg/dL — ABNORMAL HIGH (ref 0.44–1.00)
GFR, Estimated: 37 mL/min — ABNORMAL LOW (ref >60)
Glucose, Bld: 79 mg/dL (ref 70–99)
Potassium: 3.5 mmol/L (ref 3.5–5.1)
Sodium: 139 mmol/L (ref 135–145)

## 2022-06-05 LAB — MAGNESIUM: Magnesium: 2 mg/dL (ref 1.7–2.4)

## 2022-06-05 LAB — TROPONIN I (HIGH SENSITIVITY)
Troponin I (High Sensitivity): 123 ng/L (ref <18)
Troponin I (High Sensitivity): 104 ng/L (ref <18)

## 2022-06-05 LAB — BRAIN NATRIURETIC PEPTIDE: B Natriuretic Peptide: 149.6 pg/mL — ABNORMAL HIGH (ref 0.0–100.0)

## 2022-06-05 MED ORDER — NITROGLYCERIN 0.4 MG SL SUBL
0.4000 mg | SUBLINGUAL_TABLET | SUBLINGUAL | Status: DC | PRN
  Administered 2022-06-05 (×2): 0.4 mg via SUBLINGUAL
  Filled 2022-06-05 (×2): qty 1

## 2022-06-05 MED ORDER — PANTOPRAZOLE SODIUM 40 MG PO TBEC
40.0000 mg | DELAYED_RELEASE_TABLET | Freq: Two times a day (BID) | ORAL | Status: DC
  Administered 2022-06-05 – 2022-06-07 (×4): 40 mg via ORAL
  Filled 2022-06-05 (×4): qty 1

## 2022-06-05 MED ORDER — IPRATROPIUM-ALBUTEROL 0.5-2.5 (3) MG/3ML IN SOLN
3.0000 mL | Freq: Two times a day (BID) | RESPIRATORY_TRACT | Status: DC
  Administered 2022-06-05 – 2022-06-07 (×4): 3 mL via RESPIRATORY_TRACT
  Filled 2022-06-05 (×5): qty 3

## 2022-06-05 MED ORDER — TORSEMIDE 20 MG PO TABS
20.0000 mg | ORAL_TABLET | Freq: Every day | ORAL | Status: DC
  Administered 2022-06-06 – 2022-06-07 (×2): 20 mg via ORAL
  Filled 2022-06-05 (×2): qty 1

## 2022-06-05 MED ORDER — HYDRALAZINE HCL 25 MG PO TABS
25.0000 mg | ORAL_TABLET | Freq: Three times a day (TID) | ORAL | Status: DC
  Administered 2022-06-05 – 2022-06-07 (×6): 25 mg via ORAL
  Filled 2022-06-05 (×6): qty 1

## 2022-06-05 NOTE — Progress Notes (Signed)
       CROSS COVER NOTE  NAME: Sheila Hays MRN: [id number] DOB : Aug 26, 1970 ATTENDING PHYSICIAN: Modena Jansky, MD    Date of Service   06/05/2022   HPI/Events of Note   This morning Sheila Hays is reporting 9/10 (L) chest pain reproducible on exam. Patient reports the pain is not new tonight and that she awakens suddenly early in the morning 2-3 times a week with this pain. Pain tonight improved to 3/10with SL nitroglycerin x2 and patient reports it continued to ease off to 0/10 spontaneously as it normally does as home. (+)Cough, denies  dyspnea, abdominal pain, nausea, back pain, radiation of the pain. She has wheezing on auscultation of lungs. Will give PRN duoneb.  Interventions   Assessment/Plan:  EKG Nitroglycerin Troponin- 123-->104 (Overall downtrend from Trop of 150 two days ago) BNP - 149.6 decreased from 1073 on 06/02/22 CXR- suggestive of pneumonia      To reach the provider On-Call:   7AM- 7PM see care teams to locate the attending and reach out to them via [email or web address] Password: [email or web address] 7PM-7AM contact night-coverage If you still have difficulty reaching the appropriate provider, please page the [facility] (Director on Call) for [facility] on amion for assistance  This document was prepared using Systems analyst and may include unintentional dictation errors.  Sheila Glass DNP, MBA, FNP-BC Nurse Practitioner [facility] Pager [phone]

## 2022-06-05 NOTE — Progress Notes (Signed)
Pt reports non radiating left chest pain, describes as sharp, constant, 9/10. Nothing makes worse or better, sudden on arrival. Denies shortness of breath. Neomia Glass, NP notified with new orders noted. Nitro given x2, pain decreased to 3/10. Valetta Fuller, NP on unit for assessment, instructed to give duoneb tx for wheezing. Pt also given cough syrup for non productive cough. Reports relief, awaiting lab draw, lab notified of new lab orders, including stat lab draw.

## 2022-06-05 NOTE — Consult Note (Signed)
Cardiology Consultation   Patient ID: Sheila Hays MRN: [id number]; DOB: 26-Jul-1970  Admit date: 06/02/2022 Date of Consult: 06/05/2022  PCP:  [facility] [facility] Cardiologist: Horace Porteous, [place] Physician requesting consult: Dr. Algis Liming Reason for consult: Shortness of breath, chest pain  Patient Profile:   Sheila Hays is a 52 y.o. female with a hx of  HFimpEF, CKD stage 3, seizure disorder, uncontrolled HTN, severe pulmonary hypertension and GERD who presents with worsening shortness of breath  History of Present Illness:   Sheila Hays was recently seen in cardiology clinic May 30, 2022 Presenting June 02, 2022 with increasing shortness of breath for 2 days, worse on exertion, wheezing, no relief on inhalers In the emergency room saturations 87%, given DuoNebs Diagnosed with an [place], COPD exacerbation, hypertensive urgency Unable to exclude decompensated CHF,  Lab work documented mildly elevated troponin relatively nontrending 165 on arrival trending downward now 104 felt consistent with supply/demand mismatch  Reproducible chest pain overnight, responsive to nitro Reports significant coughing, makes her chest discomfort worse Lasix held for worsening renal dysfunction  Currently denies any leg edema, no abdominal distention concerning for fluid retention At home reports she is taking torsemide 20 daily  Prior cardiac history Myoview in 2019 was low risk.   right heart cath July 2023 which showed severe pulmonary hypertension.  Treated with Lasix infusion at that time wedge 30, PA pressure 95/44 with mean 64 mmHg  Prior cardiac history as detailed below nuclear stress test in 2016 that was abnormal, notable for perfusion defect of the lateral inferior segment with an EF of 35%. Echo at that time demonstrated an EF of 25-30%, mildly to moderately dilated LV cavity size, mild to moderate concentric LVH, mild to  moderate MR, mild TR, PASP 55-60 mmHg, and a trivial pericardial effusion.    echo in 2018 demonstrated a low normal LVSF with an EF of 50-55%, normal wall motion.    evaluated  at [facility] in [place] for chest pain following transfer from [facility]. Nuclear stress test at that time showed a small defect of mild severity in the basal anteroseptal and mid anteroseptal location consistent with ischemia vs breast attenuation artifact with an EF of 45-54%. Overall, this was a low risk study.    Echo during admission in 11/2020 for volume overload in the setting of poorly controlled hypertension showed an EF of 55-60%, no RWMA, normal LV diastolic function parameters, normal RV systolic function and ventricular cavity size, and trivial mitral regurgitation.   admitted 6/28-7/14 for shortness of breath and chest pain. BNP ws 500s. CXR with b/l pleural effusions and concern for sepsis. She was admitted for diuresis with IV lasix and hypertensive urgency.   [place] 11/25/21 showed severe pulmonary HTN.  moved to the ICU for milrinone drip and PICC line.  Sildenafil and BB were held for hypotension.  Required norepinephrine.  eventually transitioned to torsmide 40mg  daily.    Last seen 12/11/21 and was doing well. She denied chest pain, SOB, LLE, orthopnea or pnd. She noted increasing weights and was started back on torsemide 20mg  daily.    Last seen 01/30/22 and was doubling up on Torsemide 20 daily. Weight was down 7 lbs.  Now reports taking torsemide 20 daily   Past Medical History:  Diagnosis Date   CHF (congestive heart failure) (HCC)    Chronic kidney disease (CKD), stage III (moderate) (HCC)    GERD (gastroesophageal reflux disease)  Hypertension    Seizures (HCC)    [facility]    Past Surgical History:  Procedure Laterality Date   RIGHT HEART CATH N/A 11/25/2021   Procedure: RIGHT HEART CATH;  Surgeon: Iran OuchArida, Muhammad A, MD;  Location: [facility] INVASIVE CV LAB;  Service:  Cardiovascular;  Laterality: N/A;     Home Medications:  Prior to Admission medications   Medication Sig Start Date End Date Taking? Authorizing Provider  acetaminophen (TYLENOL) 500 MG tablet Take 500 mg by mouth every 6 (six) hours as needed.   Yes [provider]  ADVAIR DISKUS 250-50 MCG/ACT AEPB Inhale 1 puff into the lungs 2 (two) times daily. 11/18/21  Yes [provider]  albuterol (VENTOLIN HFA) 108 (90 Base) MCG/ACT inhaler Inhale 2 puffs into the lungs every 4 (four) hours as needed. 10/16/20  Yes [provider]  aspirin EC 81 MG tablet Take 81 mg by mouth daily.   Yes [provider]  calcium carbonate (TUMS - DOSED IN MG ELEMENTAL CALCIUM) 500 MG chewable tablet Chew 1 tablet (200 mg of elemental calcium total) by mouth 3 (three) times daily as needed for indigestion or heartburn. 12/06/21  Yes Sunnie NielsenAlexander, Natalie, DO  carvedilol (COREG) 6.25 MG tablet Take 1 tablet (6.25 mg total) by mouth 2 (two) times daily. 05/30/22  Yes Furth, Cadence H, PA-C  folic acid (FOLVITE) 1 MG tablet Take 0.5 tablets (0.5 mg total) by mouth daily. 12/07/21  Yes Sunnie NielsenAlexander, Natalie, DO  ipratropium-albuterol (DUONEB) 0.5-2.5 (3) MG/3ML SOLN Take 3 mLs by nebulization every 6 (six) hours as needed. 11/20/21  Yes [provider]  lamoTRIgine (LAMICTAL) 25 MG tablet Take 25 mg by mouth 2 (two) times daily. 10/08/21  Yes [provider]  montelukast (SINGULAIR) 10 MG tablet Take 10 mg by mouth daily. 11/06/21  Yes [provider]  omeprazole (PRILOSEC) 20 MG capsule Take 20 mg by mouth daily.   Yes [provider]  oxcarbazepine (TRILEPTAL) 600 MG tablet Take 600 mg by mouth 2 (two) times daily.   Yes [provider]  rosuvastatin (CRESTOR) 20 MG tablet Take 20 mg by mouth at bedtime. 12/10/21  Yes [provider]  torsemide (DEMADEX) 20 MG tablet Take 20 mg by mouth 2 (two) times daily. 12/10/21  Yes [provider]   traZODone (DESYREL) 100 MG tablet Take 100 mg by mouth at bedtime as needed for sleep.   Yes [provider]  vitamin C (ASCORBIC ACID) 500 MG tablet Take 500 mg by mouth daily.   Yes [provider]  empagliflozin (JARDIANCE) 10 MG TABS tablet Take 1 tablet (10 mg total) by mouth daily before breakfast. Patient not taking: Reported on 06/02/2022 05/30/22   Fransico MichaelFurth, Cadence H, PA-C  LINZESS 145 MCG CAPS capsule Take 145 mcg by mouth daily as needed. Patient not taking: Reported on 06/02/2022 10/08/21   [provider]    Inpatient Medications: Scheduled Meds:  amLODipine  10 mg Oral Daily   aspirin EC  81 mg Oral Daily   benzonatate  200 mg Oral TID   budesonide (PULMICORT) nebulizer solution  0.5 mg Nebulization BID   carvedilol  12.5 mg Oral BID   enoxaparin (LOVENOX) injection  0.5 mg/kg Subcutaneous Q24H   guaiFENesin  1,200 mg Oral BID   ipratropium-albuterol  3 mL Nebulization BID   lamoTRIgine  25 mg Oral BID   linaclotide  145 mcg Oral QAC breakfast   montelukast  10 mg Oral Daily  oseltamivir  30 mg Oral BID   oxcarbazepine  600 mg Oral BID   pantoprazole  40 mg Oral BID AC   predniSONE  40 mg Oral Q breakfast   rosuvastatin  20 mg Oral QHS   Continuous Infusions:  PRN Meds: acetaminophen **OR** acetaminophen, guaiFENesin, hydrALAZINE, ipratropium-albuterol, menthol-cetylpyridinium, nitroGLYCERIN, ondansetron **OR** ondansetron (ZOFRAN) IV, oxyCODONE, phenol, senna-docusate, traZODone  Allergies:    Allergies  Allergen Reactions   Ciprofloxacin Itching    Social History:   Social History   Socioeconomic History   Marital status: Single    Spouse name: Not on file   Number of children: Not on file   Years of education: Not on file   Highest education level: Not on file  Occupational History   Not on file  Tobacco Use   Smoking status: Former   Smokeless tobacco: Former    Quit date: 03/06/2017  Vaping Use   Vaping Use: Never used   Substance and Sexual Activity   Alcohol use: Not Currently   Drug use: Never   Sexual activity: Not Currently  Other Topics Concern   Not on file  Social History Narrative   Not on file   Social Determinants of Health   Financial Resource Strain: Not on file  Food Insecurity: No Food Insecurity (06/03/2022)   Hunger Vital Sign    Worried About Running Out of Food in the Last Year: Never true    Ran Out of Food in the Last Year: Never true  Transportation Needs: No Transportation Needs (06/03/2022)   PRAPARE - Administrator, Civil Service (Medical): No    Lack of Transportation (Non-Medical): No  Physical Activity: Not on file  Stress: Not on file  Social Connections: Not on file  Intimate Partner Violence: Not At Risk (06/03/2022)   Humiliation, Afraid, Rape, and Kick questionnaire    Fear of Current or Ex-Partner: No    Emotionally Abused: No    Physically Abused: No    Sexually Abused: No    Family History:    Family History  Problem Relation Age of Onset   Hypertension Mother    Hypertension Father    Heart failure Sister    Diabetes Sister    Seizures Sister    Breast cancer Neg Hx      ROS:  Please see the history of present illness.  Review of Systems  Constitutional: Negative.   HENT: Negative.    Respiratory:  Positive for cough, sputum production and shortness of breath.   Cardiovascular: Negative.   Gastrointestinal: Negative.   Musculoskeletal: Negative.   Neurological: Negative.   Psychiatric/Behavioral: Negative.    All other systems reviewed and are negative.   Physical Exam/Data:   Vitals:   06/05/22 0500 06/05/22 0722 06/05/22 0815 06/05/22 1413  BP:   (!) 146/72 (!) 165/84  Pulse:   76   Resp:   16   Temp:   100.2 F (37.9 C) 99 F (37.2 C)  TempSrc:    Oral  SpO2:  (!) 89% 94% 94%  Weight: 115.1 kg     Height:        Intake/Output Summary (Last 24 hours) at 06/05/2022 1518 Last data filed at 06/05/2022 0849 Gross per 24  hour  Intake 290 ml  Output --  Net 290 ml      06/05/2022    5:00 AM 06/04/2022    4:55 AM 06/03/2022    1:27 PM  Last 3 Weights  Weight (  lbs) 253 lb 12 oz 266 lb 5.1 oz 266 lb 5.1 oz  Weight (kg) 115.1 kg 120.8 kg 120.8 kg     Body mass index is 42.23 kg/m.  General:  Well nourished, well developed, in no acute distress HEENT: normal Neck: no JVD Vascular: No carotid bruits; Distal pulses 2+ bilaterally Cardiac:  normal S1, S2; RRR; no murmur  Lungs: Coarse breath sounds with expiratory wheezing Abd: soft, nontender, no hepatomegaly  Ext: no edema Musculoskeletal:  No deformities, BUE and BLE strength normal and equal Skin: warm and dry  Neuro:  CNs 2-12 intact, no focal abnormalities noted Psych:  Normal affect   EKG:  The EKG was personally reviewed and demonstrates:   Normal sinus rhythm rate 78 bpm poor R wave progression through the anterior precordial leads, LVH  Telemetry:  Telemetry was personally reviewed and demonstrates:   Normal sinus rhythm  Relevant CV Studies: Echo June 03, 2022 1. Left ventricular ejection fraction, by estimation, is 50 to 55%. The  left ventricle has low normal function. The left ventricle has no regional  wall motion abnormalities. The left ventricular internal cavity size was  mildly to moderately dilated.  There is moderate left ventricular hypertrophy. Left ventricular diastolic  parameters are consistent with Grade II diastolic dysfunction  (pseudonormalization). Elevated left atrial pressure.   2. Right ventricular systolic function is normal. The right ventricular  size is normal.   3. Left atrial size was mildly dilated.   4. Right atrial size was mildly dilated.   5. The mitral valve is degenerative. Trivial mitral valve regurgitation.   6. The aortic valve is tricuspid. Aortic valve regurgitation is not  visualized. No aortic stenosis is present.   7. The inferior vena cava is normal in size with greater than 50%   respiratory variability, suggesting right atrial pressure of 3 mmHg.   Laboratory Data:  High Sensitivity Troponin:   Recent Labs  Lab 06/03/22 1420 06/03/22 1624 06/03/22 1949 06/05/22 0206 06/05/22 0433  TROPONINIHS 167* 157* 150* 123* 104*     Chemistry Recent Labs  Lab 06/03/22 0329 06/04/22 0301 06/05/22 0206  NA 137 139 139  K 3.6 3.4* 3.5  CL 101 99 100  CO2 26 31 28   GLUCOSE 98 118* 79  BUN 19 29* 33*  CREATININE 1.41* 1.64* 1.63*  CALCIUM 8.7* 8.9 8.5*  MG  --  2.1 2.0  GFRNONAA 45* 37* 37*  ANIONGAP 10 9 11     Recent Labs  Lab 06/02/22 1506  PROT 7.6  ALBUMIN 3.7  AST 30  ALT 23  ALKPHOS 82  BILITOT 0.8   Lipids No results for input(s): "CHOL", "TRIG", "HDL", "LABVLDL", "LDLCALC", "CHOLHDL" in the last 168 hours.  Hematology Recent Labs  Lab 06/02/22 1448  WBC 4.7  RBC 4.16  HGB 11.9*  HCT 38.9  MCV 93.5  MCH 28.6  MCHC 30.6  RDW 15.3  PLT 183   Thyroid No results for input(s): "TSH", "FREET4" in the last 168 hours.  BNP Recent Labs  Lab 06/02/22 1500 06/05/22 0206  BNP 1,073.6* 149.6*    DDimer No results for input(s): "DDIMER" in the last 168 hours.   Radiology/Studies:  DG Chest Port 1 View  Result Date: 06/05/2022 CLINICAL DATA:  Chest pain EXAM: PORTABLE CHEST 1 VIEW COMPARISON:  06/02/2022 FINDINGS: Pulmonary insufflation is normal and stable since prior examination. There is progressive streaky infiltrate within the right perihilar region and lung base in keeping with developing pneumonic infiltrate in  the appropriate clinical setting. No pneumothorax or pleural effusion. Cardiac size is mildly enlarged, unchanged. No acute bone abnormality. IMPRESSION: 1. Progressive streaky infiltrate within the right perihilar region and lung base in keeping with developing pneumonic infiltrate in the appropriate clinical setting. Electronically Signed   By: Helyn Numbers M.D.   On: 06/05/2022 01:51   ECHOCARDIOGRAM COMPLETE  Result  Date: 06/04/2022    ECHOCARDIOGRAM REPORT   Patient Name:   [facility] Date of Exam: 06/03/2022 Medical Rec #:  [id number]          Height:       65.0 in Accession #:    [id number]         Weight:       266.3 lb Date of Birth:  1970-06-11          BSA:          2.234 m Patient Age:    51 years           BP:           179/91 mmHg Patient Gender: F                  HR:           89 bpm. Exam Location:  [facility] Procedure: 2D Echo, Cardiac Doppler and Color Doppler Indications:     CHF  History:         Patient has prior history of Echocardiogram examinations, most                  recent 11/21/2021. CHF, Signs/Symptoms:Chest Pain and                  Dizziness/Lightheadedness; Risk Factors:Hypertension. Flu +.  Sonographer:     Mikki Harbor Referring Phys:  [id number] Andris Baumann Diagnosing Phys: Yvonne Kendall MD  Sonographer Comments: Patient is obese. Image acquisition challenging due to respiratory motion. IMPRESSIONS  1. Left ventricular ejection fraction, by estimation, is 50 to 55%. The left ventricle has low normal function. The left ventricle has no regional wall motion abnormalities. The left ventricular internal cavity size was mildly to moderately dilated. There is moderate left ventricular hypertrophy. Left ventricular diastolic parameters are consistent with Grade II diastolic dysfunction (pseudonormalization). Elevated left atrial pressure.  2. Right ventricular systolic function is normal. The right ventricular size is normal.  3. Left atrial size was mildly dilated.  4. Right atrial size was mildly dilated.  5. The mitral valve is degenerative. Trivial mitral valve regurgitation.  6. The aortic valve is tricuspid. Aortic valve regurgitation is not visualized. No aortic stenosis is present.  7. The inferior vena cava is normal in size with greater than 50% respiratory variability, suggesting right atrial pressure of 3 mmHg. FINDINGS  Left Ventricle: Left ventricular ejection fraction, by  estimation, is 50 to 55%. The left ventricle has low normal function. The left ventricle has no regional wall motion abnormalities. The left ventricular internal cavity size was mildly to moderately  dilated. There is moderate left ventricular hypertrophy. Left ventricular diastolic parameters are consistent with Grade II diastolic dysfunction (pseudonormalization). Elevated left atrial pressure. Right Ventricle: The right ventricular size is normal. No increase in right ventricular wall thickness. Right ventricular systolic function is normal. Left Atrium: Left atrial size was mildly dilated. Right Atrium: Right atrial size was mildly dilated. Pericardium: Trivial pericardial effusion is present. Mitral Valve: The mitral valve is degenerative in appearance. There is mild thickening of the  mitral valve leaflet(s). Mild mitral annular calcification. Trivial mitral valve regurgitation. MV peak gradient, 9.4 mmHg. The mean mitral valve gradient is 4.0 mmHg. Tricuspid Valve: The tricuspid valve is not well visualized. Tricuspid valve regurgitation is not demonstrated. Aortic Valve: The aortic valve is tricuspid. Aortic valve regurgitation is not visualized. No aortic stenosis is present. Aortic valve mean gradient measures 6.0 mmHg. Aortic valve peak gradient measures 9.2 mmHg. Aortic valve area, by VTI measures 2.55 cm. Pulmonic Valve: The pulmonic valve was normal in structure. Pulmonic valve regurgitation is not visualized. No evidence of pulmonic stenosis. Aorta: The aortic root is normal in size and structure. Pulmonary Artery: The pulmonary artery is not well seen. Venous: The inferior vena cava is normal in size with greater than 50% respiratory variability, suggesting right atrial pressure of 3 mmHg. IAS/Shunts: The interatrial septum was not well visualized.  LEFT VENTRICLE PLAX 2D LVIDd:         5.70 cm   Diastology LVIDs:         4.20 cm   LV e' lateral:   6.96 cm/s LV PW:         1.50 cm   LV E/e' lateral:  18.5 LV IVS:        1.30 cm LVOT diam:     2.10 cm LV SV:         82 LV SV Index:   37 LVOT Area:     3.46 cm  RIGHT VENTRICLE RV Basal diam:  3.45 cm RV Mid diam:    3.70 cm RV S prime:     17.50 cm/s TAPSE (M-mode): 2.9 cm LEFT ATRIUM             Index        RIGHT ATRIUM           Index LA diam:        5.30 cm 2.37 cm/m   RA Area:     17.80 cm LA Vol (A2C):   68.5 ml 30.66 ml/m  RA Volume:   43.60 ml  19.52 ml/m LA Vol (A4C):   62.1 ml 27.80 ml/m LA Biplane Vol: 68.9 ml 30.84 ml/m  AORTIC VALVE                     PULMONIC VALVE AV Area (Vmax):    2.44 cm      PV Vmax:       1.19 m/s AV Area (Vmean):   2.35 cm      PV Peak grad:  5.7 mmHg AV Area (VTI):     2.55 cm AV Vmax:           152.00 cm/s AV Vmean:          112.000 cm/s AV VTI:            0.320 m AV Peak Grad:      9.2 mmHg AV Mean Grad:      6.0 mmHg LVOT Vmax:         107.00 cm/s LVOT Vmean:        76.000 cm/s LVOT VTI:          0.236 m LVOT/AV VTI ratio: 0.74  AORTA Ao Root diam: 3.20 cm MITRAL VALVE MV Area (PHT): 4.89 cm     SHUNTS MV Area VTI:   2.95 cm     Systemic VTI:  0.24 m MV Peak grad:  9.4 mmHg     Systemic Diam: 2.10 cm MV  Mean grad:  4.0 mmHg MV Vmax:       1.53 m/s MV Vmean:      84.3 cm/s MV Decel Time: 155 msec MV E velocity: 129.00 cm/s MV A velocity: 114.00 cm/s MV E/A ratio:  1.13 Yvonne Kendall MD Electronically signed by Yvonne Kendall MD Signature Date/Time: 06/04/2022/7:08:53 AM    Final    DG Chest 2 View  Result Date: 06/02/2022 CLINICAL DATA:  Shortness of breath EXAM: CHEST - 2 VIEW COMPARISON:  CXR 11/30/21 FINDINGS: No pleural effusion. No pneumothorax. There is a focal opacity at the right lung base, new from prior exam. Unchanged enlarged cardiac contours. No displaced rib fractures. Visualized upper abdomen is unremarkable. Vertebral body heights are maintained. IMPRESSION: Focal opacity at the right lung base, new from prior exam, and worrisome for infection. Electronically Signed   By: Lorenza Cambridge M.D.    On: 06/02/2022 15:21     Assessment and Plan:   Acute on chronic respiratory distress Influenza A, acute bronchitis/pneumonia with COPD exacerbation On Tamiflu, prednisone, Mucinex, bronchodilators, supportive therapy Lasix on hold for acute on chronic renal dysfunction Repeat BNP down from 1000, this morning 149 after lasix  Troponin elevation Likely demand mismatch in the setting of acute on chronic respiratory distress Troponin trending downward, peak was 167 on arrival Chest pain atypical in nature, likely in the setting of above -Consider outpatient ischemic workup, with repeat Myoview  prior Myoview 2019 -Given renal dysfunction will avoid cardiac CTA  Severe pulmonary hypertension/acute on chronic diastolic CHF Right heart catheterization July 2023 with severe pulmonary hypertension treated with milrinone infusion in the ICU As outpatient taking torsemide 20 daily, sometimes twice daily dosing Creatinine with trend upwards above her baseline on IV diuresis Received 3 doses Lasix IV 40 : 1 dose on January 8, 2 doses January 9 Lasix currently on hold, clinically appears euvolemic, she reports no leg swelling, no abdominal distention   Hypertensive urgency Currently on amlodipine 10, carvedilol 12.5 twice daily Systolic pressures 1 40-1 70s Will start hydralazine 25 mg  3 times daily  Chronic kidney disease Baseline creatinine 1.4, with several doses IV Lasix creatinine 1.6 Lasix on hold, would continue to follow Would consider restarting outpatient torsemide 20 mg daily starting tomorrow   Total encounter time more than 80 minutes  Greater than 50% was spent in counseling and coordination of care with the patient   For questions or updates, please contact [facility] Please consult [email or web address] for contact info under    Signed, Julien Nordmann, MD  06/05/2022 3:18 PM

## 2022-06-05 NOTE — Progress Notes (Addendum)
PROGRESS NOTE   Sheila Hays  DJS:[id number]    DOB: 09-14-1970    DOA: 06/02/2022  PCP: [facility]   I have briefly reviewed patients previous medical records in [facility].    Brief Narrative:  52 year old female, lives with her family, independent, PMH of chronic diastolic CHF, reported COPD, not on home oxygen, former tobacco use (quit 20 years ago), CKD stage IIIb, seizure, HTN, morbid obesity, untreated OSA, severe pulmonary hypertension, presented to the ED on 06/02/2022 with complaints of cough and dyspnea.  She was found to have influenza A.  Chest x-ray was suggestive of possible left lower lobe pneumonia.  Treating for acute bronchitis secondary to influenza A complicated by COPD exacerbation.  No clinical CHF at this time.  1/10 overnight chest pain, partially relieved with NTG, cardiology consulted.   Assessment & Plan:  Principal Problem:   Influenza A with pneumonia Active Problems:   Acute respiratory failure with hypoxia (HCC)   CAP (community acquired pneumonia)   Acute on chronic systolic CHF (congestive heart failure) (HCC)   Elevated troponin   Hypertensive urgency   Obesity, Class III, BMI 40-49.9 (morbid obesity) (HCC)   Complex partial seizure disorder (HCC)   Pulmonary hypertension (HCC)   Stage 3b chronic kidney disease (HCC)   OSA (obstructive sleep apnea)   Influenza A acute bronchitis/pneumonia, with COPD exacerbation: Procalcitonin negative, antibiotics discontinued.  Complete course of Tamiflu.  Ongoing wheezing, continue oral prednisone.  Incentive spirometry and flutter valve.  Added Mucinex.  Bronchodilator nebs and supportive care.  Bacterial pneumonia ruled out.  Recommend repeating chest x-ray in 4 weeks.  Wean down to room air this morning with oxygen saturation of 94%.  Will add PPI just in case she has LPR as cause of her ongoing wheezing.   Acute respiratory failure with hypoxia:  Multifactorial due to acute  bronchitis, COPD exacerbation, underlying untreated OSA, pulmonary hypertension and probable CHF exacerbation earlier.  Currently weaned off to room air and saturating at 94%.  Will need to evaluate for home oxygen prior to DC.  Acute on chronic diastolic CHF/severe pulmonary hypertension: History of cardiomyopathy.  LVEF 25-30% in 2016.  LVEF 55-60% in June 2023.  Repeat echo 1/9: LVEF 50-55% with grade 2 diastolic dysfunction.  Was treated with IV Lasix since admission.  Clinically volume status difficult to accurately estimate but appears euvolemic.  Intake output charting is inaccurate.  Moreover creatinine has been trending up, 1.64 but has plateaued.  Discontinued Lasix.  Follow clinically.  Cardiology consulted for evaluation of chest pain, elevated troponin.  BNP down from 1074-150.  No longer has systolic CHF.  Elevated troponin secondary to demand ischemia/atypical and typical chest pain: This had peaked to 167 and has been trending down even last night despite reported chest pain.  Patient reports almost 1 month history of waking up early hours of the morning with sharp left-sided nonradiating chest pain which relieves after sitting up or taking deep breaths.  Right heart cath in 2023 showed severe pulmonary hypertension and diuresed with IV Lasix.  Nuclear medicine stress test in 2019 was low risk.  Overnight 1/10 chest pain which was reproducible but also responded to sublingual NTG.  Cardiology consulted for assistance.  Hypertensive urgency: Continue amlodipine 10 Mg daily, carvedilol 12.5 Mg twice daily.  As needed IV hydralazine.  If persistently high, may have to add a third agent.    Hypokalemia: Replace and follow as needed.  Normocytic anemia: Outpatient follow-up.  OSA (obstructive sleep apnea) Not yet on CPAP, follows outpatient pulmonary   Stage 3b chronic kidney disease (HCC) Renal function at baseline, 1.4.  Creatinine has bumped up to 1.6 >1.6.  IV Lasix discontinued.   Reassess BMP in AM.   Pulmonary hypertension (HCC) Followed by pulmonology, patient indicates that she has not seen one yet and has an upcoming appointment   Complex partial seizure disorder (HCC) Continue Lamictal and Trileptal  Body mass index is 42.23 kg/m./Complicating factor to overall prognosis and care   DVT prophylaxis:   Lovenox   Code Status: Full Code:  Family Communication: None at bedside Disposition:  Status is: Inpatient Remains inpatient appropriate because: Ongoing wheezing, intermittent chest pain, cardiology evaluation,     Consultants:     Procedures:     Antimicrobials:      Subjective:  Overnight events noted, 9/10 chest pain which was reproducible but also responded to sublingual NTG.  Currently without chest pain.  Denies heartburn.  The symptoms have been ongoing for several days in the week for the last 4 weeks.  Denies dyspnea but still has wheezing.  No dizziness or lightheadedness reported.  Objective:   Vitals:   06/05/22 0144 06/05/22 0500 06/05/22 0722 06/05/22 0815  BP: (!) 157/93   (!) 146/72  Pulse: 72   76  Resp:    16  Temp:    100.2 F (37.9 C)  TempSrc:      SpO2: 100%  (!) 89% 94%  Weight:  115.1 kg    Height:        General exam: Young female, moderately built and very morbidly obese lying comfortably flat in bed without distress Respiratory system: Harsh breath sounds bilaterally with scattered few medium pitched expiratory rhonchi which seem less than yesterday.  No crackles.  No increased work of breathing. Cardiovascular system: S1 & S2 heard, RRR. No JVD, murmurs, rubs, gallops or clicks. No pedal edema.  Telemetry personally reviewed: Sinus rhythm with BBB morphology and occasional PVCs. Gastrointestinal system: Abdomen is nondistended, soft and nontender. No organomegaly or masses felt. Normal bowel sounds heard. Central nervous system: Alert and oriented. No focal neurological deficits. Extremities: Symmetric 5 x 5  power. Skin: No rashes, lesions or ulcers Psychiatry: Judgement and insight appear normal. Mood & affect appropriate.     Data Reviewed:   I have personally reviewed following labs and imaging studies   CBC: Recent Labs  Lab 06/02/22 1448  WBC 4.7  HGB 11.9*  HCT 38.9  MCV 93.5  PLT 183    Basic Metabolic Panel: Recent Labs  Lab 06/02/22 1448 06/03/22 0329 06/04/22 0301 06/05/22 0206  NA 139 137 139 139  K 3.8 3.6 3.4* 3.5  CL 101 101 99 100  CO2 28 26 31 28   GLUCOSE 92 98 118* 79  BUN 19 19 29* 33*  CREATININE 1.42* 1.41* 1.64* 1.63*  CALCIUM 8.9 8.7* 8.9 8.5*  MG  --   --  2.1 2.0    Liver Function Tests: Recent Labs  Lab 06/02/22 1506  AST 30  ALT 23  ALKPHOS 82  BILITOT 0.8  PROT 7.6  ALBUMIN 3.7    CBG: No results for input(s): "GLUCAP" in the last 168 hours.  Microbiology Studies:   Recent Results (from the past 240 hour(s))  Resp panel by RT-PCR (RSV, Flu A&B, Covid) Anterior Nasal Swab     Status: Abnormal   Collection Time: 06/02/22  3:07 PM   Specimen: Anterior Nasal Swab  Result Value Ref Range Status   SARS Coronavirus 2 by RT PCR NEGATIVE NEGATIVE Final    Comment: (NOTE) SARS-CoV-2 target nucleic acids are NOT DETECTED.  The SARS-CoV-2 RNA is generally detectable in upper respiratory specimens during the acute phase of infection. The lowest concentration of SARS-CoV-2 viral copies this assay can detect is 138 copies/mL. A negative result does not preclude SARS-Cov-2 infection and should not be used as the sole basis for treatment or other patient management decisions. A negative result may occur with  improper specimen collection/handling, submission of specimen other than nasopharyngeal swab, presence of viral mutation(s) within the areas targeted by this assay, and inadequate number of viral copies(<138 copies/mL). A negative result must be combined with clinical observations, patient history, and epidemiological information.  The expected result is Negative.  Fact Sheet for Patients:  BloggerCourse.com  Fact Sheet for Healthcare Providers:  SeriousBroker.it  This test is no t yet approved or cleared by the [place] FDA and  has been authorized for detection and/or diagnosis of SARS-CoV-2 by FDA under an Emergency Use Authorization (EUA). This EUA will remain  in effect (meaning this test can be used) for the duration of the COVID-19 declaration under Section 564(b)(1) of the Act, 21 U.S.C.section 360bbb-3(b)(1), unless the authorization is terminated  or revoked sooner.       Influenza A by PCR POSITIVE (A) NEGATIVE Final   Influenza B by PCR NEGATIVE NEGATIVE Final    Comment: (NOTE) The Xpert Xpress SARS-CoV-2/FLU/RSV plus assay is intended as an aid in the diagnosis of influenza from Nasopharyngeal swab specimens and should not be used as a sole basis for treatment. Nasal washings and aspirates are unacceptable for Xpert Xpress SARS-CoV-2/FLU/RSV testing.  Fact Sheet for Patients: BloggerCourse.com  Fact Sheet for Healthcare Providers: SeriousBroker.it  This test is not yet approved or cleared by the [place] FDA and has been authorized for detection and/or diagnosis of SARS-CoV-2 by FDA under an Emergency Use Authorization (EUA). This EUA will remain in effect (meaning this test can be used) for the duration of the COVID-19 declaration under Section 564(b)(1) of the Act, 21 U.S.C. section 360bbb-3(b)(1), unless the authorization is terminated or revoked.     Resp Syncytial Virus by PCR NEGATIVE NEGATIVE Final    Comment: (NOTE) Fact Sheet for Patients: BloggerCourse.com  Fact Sheet for Healthcare Providers: SeriousBroker.it  This test is not yet approved or cleared by the [place] FDA and has been authorized for detection  and/or diagnosis of SARS-CoV-2 by FDA under an Emergency Use Authorization (EUA). This EUA will remain in effect (meaning this test can be used) for the duration of the COVID-19 declaration under Section 564(b)(1) of the Act, 21 U.S.C. section 360bbb-3(b)(1), unless the authorization is terminated or revoked.  Performed at [facility], [address] [place], [place] [address]   Blood culture (routine x 2)     Status: None (Preliminary result)   Collection Time: 06/02/22  7:46 PM   Specimen: BLOOD RIGHT ARM  Result Value Ref Range Status   Specimen Description BLOOD RIGHT ARM  Final   Special Requests   Final    BOTTLES DRAWN AEROBIC AND ANAEROBIC Blood Culture adequate volume   Culture   Final    NO GROWTH 3 DAYS Performed at [facility], [address] [place], [place] [address]    Report Status PENDING  Incomplete  Blood culture (routine x 2)     Status: None (Preliminary result)   Collection  Time: 06/03/22  3:29 AM   Specimen: BLOOD  Result Value Ref Range Status   Specimen Description BLOOD RIGHT HAND  Final   Special Requests   Final    BOTTLES DRAWN AEROBIC AND ANAEROBIC Blood Culture results may not be optimal due to an inadequate volume of blood received in culture bottles   Culture   Final    NO GROWTH 2 DAYS Performed at [facility], [address], [place], [place] [address]    Report Status PENDING  Incomplete    Radiology Studies:  DG Chest Port 1 View  Result Date: 06/05/2022 CLINICAL DATA:  Chest pain EXAM: PORTABLE CHEST 1 VIEW COMPARISON:  06/02/2022 FINDINGS: Pulmonary insufflation is normal and stable since prior examination. There is progressive streaky infiltrate within the right perihilar region and lung base in keeping with developing pneumonic infiltrate in the appropriate clinical setting. No pneumothorax or pleural effusion. Cardiac size is mildly enlarged, unchanged. No acute bone abnormality. IMPRESSION: 1.  Progressive streaky infiltrate within the right perihilar region and lung base in keeping with developing pneumonic infiltrate in the appropriate clinical setting. Electronically Signed   By: Fidela Salisbury M.D.   On: 06/05/2022 01:51   ECHOCARDIOGRAM COMPLETE  Result Date: 06/04/2022    ECHOCARDIOGRAM REPORT   Patient Name:   [facility] Date of Exam: 06/03/2022 Medical Rec #:  [id number]          Height:       65.0 in Accession #:    [id number]         Weight:       266.3 lb Date of Birth:  1971/02/14          BSA:          2.234 m Patient Age:    50 years           BP:           179/91 mmHg Patient Gender: F                  HR:           89 bpm. Exam Location:  [facility] Procedure: 2D Echo, Cardiac Doppler and Color Doppler Indications:     CHF  History:         Patient has prior history of Echocardiogram examinations, most                  recent 11/21/2021. CHF, Signs/Symptoms:Chest Pain and                  Dizziness/Lightheadedness; Risk Factors:Hypertension. Flu +.  Sonographer:     Wenda Low Referring Phys:  [id number] Athena Masse Diagnosing Phys: Nelva Bush MD  Sonographer Comments: Patient is obese. Image acquisition challenging due to respiratory motion. IMPRESSIONS  1. Left ventricular ejection fraction, by estimation, is 50 to 55%. The left ventricle has low normal function. The left ventricle has no regional wall motion abnormalities. The left ventricular internal cavity size was mildly to moderately dilated. There is moderate left ventricular hypertrophy. Left ventricular diastolic parameters are consistent with Grade II diastolic dysfunction (pseudonormalization). Elevated left atrial pressure.  2. Right ventricular systolic function is normal. The right ventricular size is normal.  3. Left atrial size was mildly dilated.  4. Right atrial size was mildly dilated.  5. The mitral valve is degenerative. Trivial mitral valve regurgitation.  6. The aortic valve is tricuspid. Aortic valve  regurgitation is not visualized. No aortic  stenosis is present.  7. The inferior vena cava is normal in size with greater than 50% respiratory variability, suggesting right atrial pressure of 3 mmHg. FINDINGS  Left Ventricle: Left ventricular ejection fraction, by estimation, is 50 to 55%. The left ventricle has low normal function. The left ventricle has no regional wall motion abnormalities. The left ventricular internal cavity size was mildly to moderately  dilated. There is moderate left ventricular hypertrophy. Left ventricular diastolic parameters are consistent with Grade II diastolic dysfunction (pseudonormalization). Elevated left atrial pressure. Right Ventricle: The right ventricular size is normal. No increase in right ventricular wall thickness. Right ventricular systolic function is normal. Left Atrium: Left atrial size was mildly dilated. Right Atrium: Right atrial size was mildly dilated. Pericardium: Trivial pericardial effusion is present. Mitral Valve: The mitral valve is degenerative in appearance. There is mild thickening of the mitral valve leaflet(s). Mild mitral annular calcification. Trivial mitral valve regurgitation. MV peak gradient, 9.4 mmHg. The mean mitral valve gradient is 4.0 mmHg. Tricuspid Valve: The tricuspid valve is not well visualized. Tricuspid valve regurgitation is not demonstrated. Aortic Valve: The aortic valve is tricuspid. Aortic valve regurgitation is not visualized. No aortic stenosis is present. Aortic valve mean gradient measures 6.0 mmHg. Aortic valve peak gradient measures 9.2 mmHg. Aortic valve area, by VTI measures 2.55 cm. Pulmonic Valve: The pulmonic valve was normal in structure. Pulmonic valve regurgitation is not visualized. No evidence of pulmonic stenosis. Aorta: The aortic root is normal in size and structure. Pulmonary Artery: The pulmonary artery is not well seen. Venous: The inferior vena cava is normal in size with greater than 50% respiratory  variability, suggesting right atrial pressure of 3 mmHg. IAS/Shunts: The interatrial septum was not well visualized.  LEFT VENTRICLE PLAX 2D LVIDd:         5.70 cm   Diastology LVIDs:         4.20 cm   LV e' lateral:   6.96 cm/s LV PW:         1.50 cm   LV E/e' lateral: 18.5 LV IVS:        1.30 cm LVOT diam:     2.10 cm LV SV:         82 LV SV Index:   37 LVOT Area:     3.46 cm  RIGHT VENTRICLE RV Basal diam:  3.45 cm RV Mid diam:    3.70 cm RV S prime:     17.50 cm/s TAPSE (M-mode): 2.9 cm LEFT ATRIUM             Index        RIGHT ATRIUM           Index LA diam:        5.30 cm 2.37 cm/m   RA Area:     17.80 cm LA Vol (A2C):   68.5 ml 30.66 ml/m  RA Volume:   43.60 ml  19.52 ml/m LA Vol (A4C):   62.1 ml 27.80 ml/m LA Biplane Vol: 68.9 ml 30.84 ml/m  AORTIC VALVE                     PULMONIC VALVE AV Area (Vmax):    2.44 cm      PV Vmax:       1.19 m/s AV Area (Vmean):   2.35 cm      PV Peak grad:  5.7 mmHg AV Area (VTI):     2.55 cm AV Vmax:  152.00 cm/s AV Vmean:          112.000 cm/s AV VTI:            0.320 m AV Peak Grad:      9.2 mmHg AV Mean Grad:      6.0 mmHg LVOT Vmax:         107.00 cm/s LVOT Vmean:        76.000 cm/s LVOT VTI:          0.236 m LVOT/AV VTI ratio: 0.74  AORTA Ao Root diam: 3.20 cm MITRAL VALVE MV Area (PHT): 4.89 cm     SHUNTS MV Area VTI:   2.95 cm     Systemic VTI:  0.24 m MV Peak grad:  9.4 mmHg     Systemic Diam: 2.10 cm MV Mean grad:  4.0 mmHg MV Vmax:       1.53 m/s MV Vmean:      84.3 cm/s MV Decel Time: 155 msec MV E velocity: 129.00 cm/s MV A velocity: 114.00 cm/s MV E/A ratio:  1.13 Christopher End MD Electronically signed by Yvonne Kendall MD Signature Date/Time: 06/04/2022/7:08:53 AM    Final     Scheduled Meds:    amLODipine  10 mg Oral Daily   aspirin EC  81 mg Oral Daily   benzonatate  200 mg Oral TID   budesonide (PULMICORT) nebulizer solution  0.5 mg Nebulization BID   carvedilol  12.5 mg Oral BID   enoxaparin (LOVENOX) injection  0.5 mg/kg  Subcutaneous Q24H   guaiFENesin  1,200 mg Oral BID   ipratropium-albuterol  3 mL Nebulization BID   lamoTRIgine  25 mg Oral BID   linaclotide  145 mcg Oral QAC breakfast   montelukast  10 mg Oral Daily   oseltamivir  30 mg Oral BID   oxcarbazepine  600 mg Oral BID   pantoprazole  40 mg Oral Daily   predniSONE  40 mg Oral Q breakfast   rosuvastatin  20 mg Oral QHS    Continuous Infusions:     LOS: 3 days     Marcellus Scott, MD,  FACP, FHM, SFHM, [facility], [facility]   [facility] [place]     To contact the attending provider between 7A-7P or the covering provider during after hours 7P-7A, please log into the web site [email or web address] and access using universal [place] password for that web site. If you do not have the password, please call the hospital operator.  06/05/2022, 10:19 AM

## 2022-06-05 NOTE — Progress Notes (Addendum)
1420 Declines SOB O2 sat 94% while sitting, O2 sat 92%-94% on room air while ambulating to and from bathroom. MD aware

## 2022-06-05 NOTE — Plan of Care (Signed)
  Problem: Education: Goal: Ability to demonstrate management of disease process will improve Outcome: Progressing   Problem: Activity: Goal: Capacity to carry out activities will improve Outcome: Progressing   

## 2022-06-06 DIAGNOSIS — J101 Influenza due to other identified influenza virus with other respiratory manifestations: Secondary | ICD-10-CM | POA: Diagnosis not present

## 2022-06-06 DIAGNOSIS — J441 Chronic obstructive pulmonary disease with (acute) exacerbation: Secondary | ICD-10-CM | POA: Diagnosis not present

## 2022-06-06 DIAGNOSIS — R079 Chest pain, unspecified: Secondary | ICD-10-CM | POA: Diagnosis not present

## 2022-06-06 DIAGNOSIS — J09X1 Influenza due to identified novel influenza A virus with pneumonia: Secondary | ICD-10-CM | POA: Diagnosis not present

## 2022-06-06 DIAGNOSIS — I509 Heart failure, unspecified: Secondary | ICD-10-CM | POA: Diagnosis not present

## 2022-06-06 DIAGNOSIS — R0902 Hypoxemia: Secondary | ICD-10-CM | POA: Diagnosis not present

## 2022-06-06 LAB — BASIC METABOLIC PANEL
Anion gap: 9 (ref 5–15)
BUN: 31 mg/dL — ABNORMAL HIGH (ref 6–20)
CO2: 27 mmol/L (ref 22–32)
Calcium: 8.6 mg/dL — ABNORMAL LOW (ref 8.9–10.3)
Chloride: 100 mmol/L (ref 98–111)
Creatinine, Ser: 1.51 mg/dL — ABNORMAL HIGH (ref 0.44–1.00)
GFR, Estimated: 41 mL/min — ABNORMAL LOW (ref >60)
Glucose, Bld: 90 mg/dL (ref 70–99)
Potassium: 3.3 mmol/L — ABNORMAL LOW (ref 3.5–5.1)
Sodium: 136 mmol/L (ref 135–145)

## 2022-06-06 LAB — MAGNESIUM: Magnesium: 2.1 mg/dL (ref 1.7–2.4)

## 2022-06-06 MED ORDER — POTASSIUM CHLORIDE CRYS ER 20 MEQ PO TBCR
40.0000 meq | EXTENDED_RELEASE_TABLET | Freq: Once | ORAL | Status: AC
  Administered 2022-06-06: 40 meq via ORAL
  Filled 2022-06-06: qty 2

## 2022-06-06 NOTE — Progress Notes (Signed)
Rounding Note    Patient Name: Sheila Hays Date of Encounter: 06/06/2022  [place] Cardiologist: Peter Martinique, MD   Subjective   UOP not recorded. Scr improving. Patient feels breathing is improving, but overall she is still feeling poorly.   Inpatient Medications    Scheduled Meds:  amLODipine  10 mg Oral Daily   aspirin EC  81 mg Oral Daily   benzonatate  200 mg Oral TID   budesonide (PULMICORT) nebulizer solution  0.5 mg Nebulization BID   carvedilol  12.5 mg Oral BID   enoxaparin (LOVENOX) injection  0.5 mg/kg Subcutaneous Q24H   guaiFENesin  1,200 mg Oral BID   hydrALAZINE  25 mg Oral TID   ipratropium-albuterol  3 mL Nebulization BID   lamoTRIgine  25 mg Oral BID   linaclotide  145 mcg Oral QAC breakfast   montelukast  10 mg Oral Daily   oseltamivir  30 mg Oral BID   oxcarbazepine  600 mg Oral BID   pantoprazole  40 mg Oral BID AC   predniSONE  40 mg Oral Q breakfast   rosuvastatin  20 mg Oral QHS   torsemide  20 mg Oral Daily   Continuous Infusions:  PRN Meds: acetaminophen **OR** acetaminophen, guaiFENesin, hydrALAZINE, ipratropium-albuterol, menthol-cetylpyridinium, nitroGLYCERIN, ondansetron **OR** ondansetron (ZOFRAN) IV, oxyCODONE, phenol, senna-docusate, traZODone   Vital Signs    Vitals:   06/05/22 2138 06/06/22 0500 06/06/22 0732 06/06/22 0825  BP: (!) 153/75   139/82  Pulse: 69   65  Resp: 16   18  Temp: 98.2 F (36.8 C)   98.2 F (36.8 C)  TempSrc: Oral     SpO2: 97%  95% 97%  Weight:  116.7 kg    Height:        Intake/Output Summary (Last 24 hours) at 06/06/2022 1254 Last data filed at 06/06/2022 1025 Gross per 24 hour  Intake 440 ml  Output --  Net 440 ml      06/06/2022    5:00 AM 06/05/2022    5:00 AM 06/04/2022    4:55 AM  Last 3 Weights  Weight (lbs) 257 lb 4.8 oz 253 lb 12 oz 266 lb 5.1 oz  Weight (kg) 116.711 kg 115.1 kg 120.8 kg      Telemetry    NSR  HR 60s - Personally Reviewed  ECG    No new  - Personally Reviewed  Physical Exam   GEN: No acute distress.   Neck: No JVD Cardiac: RRR, no murmurs, rubs, or gallops.  Respiratory: wheezing GI: Soft, nontender, non-distended  MS: No edema; No deformity. Neuro:  Nonfocal  Psych: Normal affect   Labs    High Sensitivity Troponin:   Recent Labs  Lab 06/03/22 1420 06/03/22 1624 06/03/22 1949 06/05/22 0206 06/05/22 0433  TROPONINIHS 167* 157* 150* 123* 104*     Chemistry Recent Labs  Lab 06/02/22 1506 06/03/22 0329 06/04/22 0301 06/05/22 0206 06/06/22 0413  NA  --    < > 139 139 136  K  --    < > 3.4* 3.5 3.3*  CL  --    < > 99 100 100  CO2  --    < > 31 28 27   GLUCOSE  --    < > 118* 79 90  BUN  --    < > 29* 33* 31*  CREATININE  --    < > 1.64* 1.63* 1.51*  CALCIUM  --    < > 8.9  8.5* 8.6*  MG  --   --  2.1 2.0 2.1  PROT 7.6  --   --   --   --   ALBUMIN 3.7  --   --   --   --   AST 30  --   --   --   --   ALT 23  --   --   --   --   ALKPHOS 82  --   --   --   --   BILITOT 0.8  --   --   --   --   GFRNONAA  --    < > 37* 37* 41*  ANIONGAP  --    < > 9 11 9    < > = values in this interval not displayed.    Lipids No results for input(s): "CHOL", "TRIG", "HDL", "LABVLDL", "LDLCALC", "CHOLHDL" in the last 168 hours.  Hematology Recent Labs  Lab 06/02/22 1448  WBC 4.7  RBC 4.16  HGB 11.9*  HCT 38.9  MCV 93.5  MCH 28.6  MCHC 30.6  RDW 15.3  PLT 183   Thyroid No results for input(s): "TSH", "FREET4" in the last 168 hours.  BNP Recent Labs  Lab 06/02/22 1500 06/05/22 0206  BNP 1,073.6* 149.6*    DDimer No results for input(s): "DDIMER" in the last 168 hours.   Radiology    DG Chest Port 1 View  Result Date: 06/05/2022 CLINICAL DATA:  Chest pain EXAM: PORTABLE CHEST 1 VIEW COMPARISON:  06/02/2022 FINDINGS: Pulmonary insufflation is normal and stable since prior examination. There is progressive streaky infiltrate within the right perihilar region and lung base in keeping with developing  pneumonic infiltrate in the appropriate clinical setting. No pneumothorax or pleural effusion. Cardiac size is mildly enlarged, unchanged. No acute bone abnormality. IMPRESSION: 1. Progressive streaky infiltrate within the right perihilar region and lung base in keeping with developing pneumonic infiltrate in the appropriate clinical setting. Electronically Signed   By: 08/01/2022 M.D.   On: 06/05/2022 01:51    Cardiac Studies   Echo 06/03/22  1. Left ventricular ejection fraction, by estimation, is 50 to 55%. The  left ventricle has low normal function. The left ventricle has no regional  wall motion abnormalities. The left ventricular internal cavity size was  mildly to moderately dilated.  There is moderate left ventricular hypertrophy. Left ventricular diastolic  parameters are consistent with Grade II diastolic dysfunction  (pseudonormalization). Elevated left atrial pressure.   2. Right ventricular systolic function is normal. The right ventricular  size is normal.   3. Left atrial size was mildly dilated.   4. Right atrial size was mildly dilated.   5. The mitral valve is degenerative. Trivial mitral valve regurgitation.   6. The aortic valve is tricuspid. Aortic valve regurgitation is not  visualized. No aortic stenosis is present.   7. The inferior vena cava is normal in size with greater than 50%  respiratory variability, suggesting right atrial pressure of 3 mmHg.   Cardiac cath 11/25/21   Hemodynamic findings consistent with severe pulmonary hypertension.   Successful right heart catheterization via the right antecubital vein. This showed evidence of severely elevated right and left-sided filling pressures, severe pulmonary hypertension and normal cardiac output.   RA: 22 mmHg RV: 92/18 with an end-diastolic pressure of 32 mmHg. PA: 95/44 with a mean of 64 mmHg PCW: 30 mmHg  Cardiac output: 7.4 with an index of 3.16. Pulmonary vascular resistance: 4.5 [place] units  Recommendations: The patient is significantly volume overloaded. Pulmonary hypertension is severe and seems to be of a mixed venous and arterial etiology. We will start the patient on furosemide drip and the dose should be uptitrated based on urine output response.  Diuresis might be difficult due to the presence of severe pulmonary hypertension and likely RV dysfunction.  If renal function worsens with diuresis, she might require inotropic therapy for RV support and consideration to transfer to an advanced heart failure service.     Echo 10/2021  1. Left ventricular ejection fraction, by estimation, is 55 to 60%. The  left ventricle has normal function. The left ventricle has no regional  wall motion abnormalities. The left ventricular internal cavity size was  mildly dilated. There is mild left  ventricular hypertrophy. Left ventricular diastolic function could not be  evaluated.   2. Right ventricular systolic function is normal. The right ventricular  size is not well visualized.   3. Left atrial size was mild to moderately dilated.   4. The mitral valve is normal in structure. No evidence of mitral valve  regurgitation.   5. The aortic valve was not well visualized. Aortic valve regurgitation  is not visualized.   6. The inferior vena cava is normal in size with <50% respiratory  variability, suggesting right atrial pressure of 8 mmHg.   Patient Profile     52 y.o. female with a hx of  HFimpEF, CKD stage 3, seizure disorder, uncontrolled HTN, severe pulmonary hypertension and GERD who presents with worsening shortness of breath   Assessment & Plan    Acute on chronic respiratory failure with hypoxia FLU/pneumonitis with COPD exacerbation - per IM  Elevated troponin Chest pain - HS trop peak 167 - Chest pain atypical in nature, reproducible on exam - suspect supply demand mismatch in the setting of acute respiratory distress and other issues - plan for outpatient ischemic  work-up, likely Myoview Lexiscan given renal dysfunction - continue Aspirin, statin and BB therapy  Severe pulmonary hypertension Acute on chronic diastolic heart failure - H/o CM, LVEF 25-30% in 2016 - LVEF 55-60% in June 2023 - echo this admission showed LVEF 50-55%, G2DD - RHC in July 2023 showed severe pulmonary HTN treated with milrinone infusion in the ICU - PTA torsemide 20mg  daily - IV lasix transitioned to torsemide 20mg  daily - I/OS not accurate  Hypertensive urgency - amlodipine 10mg  daily - Coreg 12.5mg BID - Hydralazine 25mg  TID - BP improving  CKD stage - Baseline Scr 1.4 - lasix held - Scr improving  For questions or updates, please contact [place] Please consult [email or web address] for contact info under        Signed, Camille Thau Ninfa Meeker, PA-C  06/06/2022, 12:54 PM

## 2022-06-06 NOTE — Progress Notes (Signed)
PROGRESS NOTE   Sheila Hays  JXB:[id number]    DOB: 1971/04/07    DOA: 06/02/2022  PCP: [facility]   I have briefly reviewed patients previous medical records in [facility].    Brief Narrative:  52 year old female, lives with her family, independent, PMH of chronic diastolic CHF, reported COPD, not on home oxygen, former tobacco use (quit 20 years ago), CKD stage IIIb, seizure, HTN, morbid obesity, untreated OSA, severe pulmonary hypertension, presented to the ED on 06/02/2022 with complaints of cough and dyspnea.  She was found to have influenza A.  Chest x-ray was suggestive of possible left lower lobe pneumonia.  Treating for acute bronchitis secondary to influenza A complicated by COPD exacerbation.  No clinical CHF at this time.  1/10 overnight chest pain, partially relieved with NTG, cardiology consulted.   Assessment & Plan:  Principal Problem:   Influenza A Active Problems:   Acute respiratory failure with hypoxia (HCC)   CAP (community acquired pneumonia)   Acute on chronic systolic CHF (congestive heart failure) (HCC)   Elevated troponin   Hypertensive urgency   Obesity, Class III, BMI 40-49.9 (morbid obesity) (HCC)   Complex partial seizure disorder (HCC)   Pulmonary hypertension, unspecified (HCC)   Stage 3b chronic kidney disease (HCC)   OSA (obstructive sleep apnea)   Hypoxia   Acute congestive heart failure (HCC)   COPD exacerbation (HCC)   Influenza A acute bronchitis/pneumonia, with COPD exacerbation: Procalcitonin negative, antibiotics discontinued.  Complete course of Tamiflu.  Ongoing wheezing, continue oral prednisone.  Incentive spirometry and flutter valve.  Added Mucinex.  Bronchodilator nebs and supportive care.  Bacterial pneumonia ruled out.  Recommend repeating chest x-ray in 4 weeks.  PPI increased to twice daily in case she has LPR complicating her wheezing.   Acute respiratory failure with hypoxia:  Multifactorial due to  acute bronchitis, COPD exacerbation, underlying untreated OSA, pulmonary hypertension and probable CHF exacerbation earlier.  Has been weaned down to room air for more than 24 hours.  Reassess home oxygen needs prior to discharge.  Acute on chronic diastolic CHF/severe pulmonary hypertension: History of cardiomyopathy.  LVEF 25-30% in 2016.  LVEF 55-60% in June 2023.  Repeat echo 1/9: LVEF 50-55% with grade 2 diastolic dysfunction.  Was treated with IV Lasix since admission.  Clinically volume status difficult to accurately estimate but appears euvolemic.  Intake output charting is inaccurate.  Moreover creatinine has been trending up, 1.64 but has plateaued.  Discontinued Lasix.  BNP down from 1074-150.  No longer has systolic CHF.  Cardiology follow-up appreciated.  Creatinine down to 1.51.  They have resumed home dose of torsemide 20 Mg daily.  Elevated troponin secondary to demand ischemia/atypical and typical chest pain: This had peaked to 167 and has been trending down even last night despite reported chest pain.  Patient reports almost 1 month history of waking up early hours of the morning with sharp left-sided nonradiating chest pain which relieves after sitting up or taking deep breaths.  Right heart cath in 2023 showed severe pulmonary hypertension and diuresed with IV Lasix.  Nuclear medicine stress test in 2019 was low risk.  Overnight 1/10 chest pain which was reproducible but also responded to sublingual NTG.  Cardiology input appreciated and will consider ischemic workup as outpatient.  They suspect troponin elevation due to demand mismatch.  Hypertensive urgency: Continue amlodipine 10 Mg daily, carvedilol 12.5 Mg twice daily.  As needed IV hydralazine.  Blood pressures better controlled.  Hypokalemia: Continue to replace as needed and follow.  Normocytic anemia: Outpatient follow-up.  OSA (obstructive sleep apnea) Not yet on CPAP, follows outpatient pulmonary   Stage 3b chronic  kidney disease (HCC) Renal function at baseline, 1.4.  Creatinine has bumped up to 1.6 >1.6 >1.5.  IV Lasix discontinued.  Following daily BMP.   Pulmonary hypertension (HCC) Followed by pulmonology, patient indicates that she has not seen one yet and has an upcoming appointment   Complex partial seizure disorder (HCC) Continue Lamictal and Trileptal  Body mass index is 42.82 kg/m./Complicating factor to overall prognosis and care   DVT prophylaxis:   Lovenox   Code Status: Full Code:  Family Communication: None at bedside Disposition:  Status is: Inpatient Remains inpatient appropriate because: Despite no dyspnea, still with some wheezing, starting torsemide today, monitor overnight, reassess in a.m. including home O2 eval and hopeful discharge tomorrow.     Consultants:     Procedures:     Antimicrobials:      Subjective:  No dyspnea.  Has ambulated some.  Still with fair amount of wheezing.  No recurrent chest pain.  Objective:   Vitals:   06/06/22 0500 06/06/22 0732 06/06/22 0825 06/06/22 1523  BP:   139/82 (!) 148/134  Pulse:   65 74  Resp:   18 18  Temp:   98.2 F (36.8 C) 98 F (36.7 C)  TempSrc:   Oral   SpO2:  95% 97% 98%  Weight: 116.7 kg     Height:        General exam: Young female, moderately built and very morbidly obese lying comfortably flat in bed without distress Respiratory system: Breath sounds improving, few rhonchi bilaterally but less compared to the last 2 days.  No increased work of breathing. Cardiovascular system: S1 & S2 heard, RRR. No JVD, murmurs, rubs, gallops or clicks. No pedal edema.  Telemetry personally reviewed: Sinus rhythm. Gastrointestinal system: Abdomen is nondistended, soft and nontender. No organomegaly or masses felt. Normal bowel sounds heard. Central nervous system: Alert and oriented. No focal neurological deficits. Extremities: Symmetric 5 x 5 power. Skin: No rashes, lesions or ulcers Psychiatry: Judgement and  insight appear normal. Mood & affect appropriate.     Data Reviewed:   I have personally reviewed following labs and imaging studies   CBC: Recent Labs  Lab 06/02/22 1448  WBC 4.7  HGB 11.9*  HCT 38.9  MCV 93.5  PLT 183    Basic Metabolic Panel: Recent Labs  Lab 06/02/22 1448 06/03/22 0329 06/04/22 0301 06/05/22 0206 06/06/22 0413  NA 139 137 139 139 136  K 3.8 3.6 3.4* 3.5 3.3*  CL 101 101 99 100 100  CO2 28 26 31 28 27   GLUCOSE 92 98 118* 79 90  BUN 19 19 29* 33* 31*  CREATININE 1.42* 1.41* 1.64* 1.63* 1.51*  CALCIUM 8.9 8.7* 8.9 8.5* 8.6*  MG  --   --  2.1 2.0 2.1    Liver Function Tests: Recent Labs  Lab 06/02/22 1506  AST 30  ALT 23  ALKPHOS 82  BILITOT 0.8  PROT 7.6  ALBUMIN 3.7    CBG: No results for input(s): "GLUCAP" in the last 168 hours.  Microbiology Studies:   Recent Results (from the past 240 hour(s))  Resp panel by RT-PCR (RSV, Flu A&B, Covid) Anterior Nasal Swab     Status: Abnormal   Collection Time: 06/02/22  3:07 PM   Specimen: Anterior Nasal Swab  Result Value Ref Range  Status   SARS Coronavirus 2 by RT PCR NEGATIVE NEGATIVE Final    Comment: (NOTE) SARS-CoV-2 target nucleic acids are NOT DETECTED.  The SARS-CoV-2 RNA is generally detectable in upper respiratory specimens during the acute phase of infection. The lowest concentration of SARS-CoV-2 viral copies this assay can detect is 138 copies/mL. A negative result does not preclude SARS-Cov-2 infection and should not be used as the sole basis for treatment or other patient management decisions. A negative result may occur with  improper specimen collection/handling, submission of specimen other than nasopharyngeal swab, presence of viral mutation(s) within the areas targeted by this assay, and inadequate number of viral copies(<138 copies/mL). A negative result must be combined with clinical observations, patient history, and epidemiological information. The expected  result is Negative.  Fact Sheet for Patients:  EntrepreneurPulse.com.au  Fact Sheet for Healthcare Providers:  IncredibleEmployment.be  This test is no t yet approved or cleared by the [place] FDA and  has been authorized for detection and/or diagnosis of SARS-CoV-2 by FDA under an Emergency Use Authorization (EUA). This EUA will remain  in effect (meaning this test can be used) for the duration of the COVID-19 declaration under Section 564(b)(1) of the Act, 21 U.S.C.section 360bbb-3(b)(1), unless the authorization is terminated  or revoked sooner.       Influenza A by PCR POSITIVE (A) NEGATIVE Final   Influenza B by PCR NEGATIVE NEGATIVE Final    Comment: (NOTE) The Xpert Xpress SARS-CoV-2/FLU/RSV plus assay is intended as an aid in the diagnosis of influenza from Nasopharyngeal swab specimens and should not be used as a sole basis for treatment. Nasal washings and aspirates are unacceptable for Xpert Xpress SARS-CoV-2/FLU/RSV testing.  Fact Sheet for Patients: EntrepreneurPulse.com.au  Fact Sheet for Healthcare Providers: IncredibleEmployment.be  This test is not yet approved or cleared by the [place] FDA and has been authorized for detection and/or diagnosis of SARS-CoV-2 by FDA under an Emergency Use Authorization (EUA). This EUA will remain in effect (meaning this test can be used) for the duration of the COVID-19 declaration under Section 564(b)(1) of the Act, 21 U.S.C. section 360bbb-3(b)(1), unless the authorization is terminated or revoked.     Resp Syncytial Virus by PCR NEGATIVE NEGATIVE Final    Comment: (NOTE) Fact Sheet for Patients: EntrepreneurPulse.com.au  Fact Sheet for Healthcare Providers: IncredibleEmployment.be  This test is not yet approved or cleared by the [place] FDA and has been authorized for detection and/or  diagnosis of SARS-CoV-2 by FDA under an Emergency Use Authorization (EUA). This EUA will remain in effect (meaning this test can be used) for the duration of the COVID-19 declaration under Section 564(b)(1) of the Act, 21 U.S.C. section 360bbb-3(b)(1), unless the authorization is terminated or revoked.  Performed at [facility], [address] [place], [place] [address]   Blood culture (routine x 2)     Status: None (Preliminary result)   Collection Time: 06/02/22  7:46 PM   Specimen: BLOOD RIGHT ARM  Result Value Ref Range Status   Specimen Description BLOOD RIGHT ARM  Final   Special Requests   Final    BOTTLES DRAWN AEROBIC AND ANAEROBIC Blood Culture adequate volume   Culture   Final    NO GROWTH 4 DAYS Performed at [facility], [address] [place], [place] [address]    Report Status PENDING  Incomplete  Blood culture (routine x 2)     Status: None (Preliminary result)   Collection Time: 06/03/22  3:29  AM   Specimen: BLOOD  Result Value Ref Range Status   Specimen Description BLOOD RIGHT HAND  Final   Special Requests   Final    BOTTLES DRAWN AEROBIC AND ANAEROBIC Blood Culture results may not be optimal due to an inadequate volume of blood received in culture bottles   Culture   Final    NO GROWTH 3 DAYS Performed at [facility], [address] [place], [place] [address]    Report Status PENDING  Incomplete    Radiology Studies:  DG Chest Port 1 View  Result Date: 06/05/2022 CLINICAL DATA:  Chest pain EXAM: PORTABLE CHEST 1 VIEW COMPARISON:  06/02/2022 FINDINGS: Pulmonary insufflation is normal and stable since prior examination. There is progressive streaky infiltrate within the right perihilar region and lung base in keeping with developing pneumonic infiltrate in the appropriate clinical setting. No pneumothorax or pleural effusion. Cardiac size is mildly enlarged, unchanged. No acute bone abnormality. IMPRESSION: 1. Progressive  streaky infiltrate within the right perihilar region and lung base in keeping with developing pneumonic infiltrate in the appropriate clinical setting. Electronically Signed   By: Fidela Salisbury M.D.   On: 06/05/2022 01:51    Scheduled Meds:    amLODipine  10 mg Oral Daily   aspirin EC  81 mg Oral Daily   benzonatate  200 mg Oral TID   budesonide (PULMICORT) nebulizer solution  0.5 mg Nebulization BID   carvedilol  12.5 mg Oral BID   enoxaparin (LOVENOX) injection  0.5 mg/kg Subcutaneous Q24H   guaiFENesin  1,200 mg Oral BID   hydrALAZINE  25 mg Oral TID   ipratropium-albuterol  3 mL Nebulization BID   lamoTRIgine  25 mg Oral BID   linaclotide  145 mcg Oral QAC breakfast   montelukast  10 mg Oral Daily   oseltamivir  30 mg Oral BID   oxcarbazepine  600 mg Oral BID   pantoprazole  40 mg Oral BID AC   predniSONE  40 mg Oral Q breakfast   rosuvastatin  20 mg Oral QHS   torsemide  20 mg Oral Daily    Continuous Infusions:     LOS: 4 days     Vernell Leep, MD,  FACP, FHM, SFHM, [facility], [place]     To contact the attending provider between 7A-7P or the covering provider during after hours 7P-7A, please log into the web site [email or web address] and access using universal [place] password for that web site. If you do not have the password, please call the hospital operator.  06/06/2022, 4:43 PM

## 2022-06-06 NOTE — TOC Initial Note (Signed)
Transition of Care [facility]) - Initial/Assessment Note    Patient Details  Name: Sheila Hays MRN: [id number] Date of Birth: 03-May-1971  Transition of Care [facility]) CM/SW Contact:    Quin Hoop, LCSW Phone Number: 06/06/2022, 4:20 PM  Clinical Narrative:                 TOC assessment completed with patient via phone due to droplet precautions.  Patient states that her PCP is [facility].  She gets her medications filled at [facility].  She lives alone.  Patient transported self or family members took her to medical appointments.    Barriers to Discharge: Continued Medical Work up   Patient Goals and CMS Choice Patient states their goals for this hospitalization and ongoing recovery are:: to return home          Expected Discharge Plan and Services   Discharge Planning Services:  (waiting on recommendations)   Living arrangements for the past 2 months: Single Family Home                                      Prior Living Arrangements/Services Living arrangements for the past 2 months: Single Family Home Lives with:: Self Patient language and need for interpreter reviewed:: No              Criminal Activity/Legal Involvement Pertinent to Current Situation/Hospitalization: No - Comment as needed  Activities of Daily Living Home Assistive Devices/Equipment: Nebulizer ADL Screening (condition at time of admission) Patient's cognitive ability adequate to safely complete daily activities?: Yes Is the patient deaf or have difficulty hearing?: No Does the patient have difficulty seeing, even when wearing glasses/contacts?: No Does the patient have difficulty concentrating, remembering, or making decisions?: No Patient able to express need for assistance with ADLs?: Yes Does the patient have difficulty dressing or bathing?: No Independently performs ADLs?: Yes (appropriate for developmental age) Does the patient have difficulty walking or climbing  stairs?: No Weakness of Legs: None Weakness of Arms/Hands: None  Permission Sought/Granted               Permission granted to share info w Contact Information: Niece, Angie Fava, sister, [phone]  Emotional Assessment Appearance::  (unable to determine) Attitude/Demeanor/Rapport: Engaged Affect (typically observed): Calm Orientation: : Oriented to Self, Oriented to Place, Oriented to  Time, Oriented to Situation Alcohol / Substance Use: Not Applicable Psych Involvement: No (comment)  Admission diagnosis:  Influenza A [J10.1] Hypoxia [R09.02] Elevated troponin [R79.89] COPD exacerbation ([place]) [J44.1] Influenza A with pneumonia [J09.X1] Acute congestive heart failure, unspecified heart failure type ([place]) [I50.9] Patient Active Problem List   Diagnosis Date Noted   Hypoxia 06/05/2022   Acute congestive heart failure ([place]) 06/05/2022   COPD exacerbation ([place]) 06/05/2022   Stage 3b chronic kidney disease ([place]) 06/02/2022   Influenza A 06/02/2022   Acute on chronic systolic CHF (congestive heart failure) ([place]) 06/02/2022   CAP (community acquired pneumonia) 06/02/2022   Hypertensive urgency 06/02/2022   OSA (obstructive sleep apnea) 06/02/2022   Elevated troponin 06/02/2022   Pulmonary hypertension, unspecified ([place]) 11/25/2021   Hypercapnic acidosis 11/24/2021   Dizziness 11/23/2021   Acute respiratory failure with hypoxia ([place]) 11/23/2021   Chest pain 11/21/2021   Acute on chronic heart failure with preserved ejection fraction (HFpEF) ([place]) 11/20/2021   Morbid obesity ([place]) 11/20/2021   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  11/30/2020   Complex partial seizure disorder ([place]) 43/15/4008   Acute metabolic encephalopathy 67/61/9509   Hypertensive emergency 11/30/2020   Obesity, Class III, BMI 40-49.9 (morbid obesity) ([place]) 04/27/2018   Essential hypertension    PCP:  [place]:    [facility] [address], [place] - [place] [address] Phone: [phone] Fax: [phone]     Social Determinants of Health (SDOH) Social History: SDOH Screenings   Food Insecurity: No Food Insecurity (06/03/2022)  Housing: Low Risk  (06/03/2022)  Transportation Needs: No Transportation Needs (06/03/2022)  Utilities: Not At Risk (06/03/2022)  Depression (PHQ2-9): Low Risk  (12/25/2021)  Tobacco Use: Medium Risk (06/03/2022)   SDOH Interventions:     Readmission Risk Interventions     No data to display

## 2022-06-06 NOTE — Plan of Care (Signed)
  Problem: Education: Goal: Ability to demonstrate management of disease process will improve Outcome: Progressing   Problem: Activity: Goal: Capacity to carry out activities will improve Outcome: Progressing   Problem: Activity: Goal: Ability to tolerate increased activity will improve Outcome: Progressing   Problem: Respiratory: Goal: Ability to maintain a clear airway will improve Outcome: Progressing   Problem: Activity: Goal: Ability to tolerate increased activity will improve Outcome: Progressing

## 2022-06-07 DIAGNOSIS — J9601 Acute respiratory failure with hypoxia: Secondary | ICD-10-CM | POA: Diagnosis not present

## 2022-06-07 DIAGNOSIS — J441 Chronic obstructive pulmonary disease with (acute) exacerbation: Secondary | ICD-10-CM | POA: Diagnosis not present

## 2022-06-07 DIAGNOSIS — J101 Influenza due to other identified influenza virus with other respiratory manifestations: Secondary | ICD-10-CM

## 2022-06-07 DIAGNOSIS — R7989 Other specified abnormal findings of blood chemistry: Secondary | ICD-10-CM

## 2022-06-07 LAB — CULTURE, BLOOD (ROUTINE X 2)
Culture: NO GROWTH
Special Requests: ADEQUATE

## 2022-06-07 LAB — BASIC METABOLIC PANEL
Anion gap: 10 (ref 5–15)
BUN: 34 mg/dL — ABNORMAL HIGH (ref 6–20)
CO2: 28 mmol/L (ref 22–32)
Calcium: 8.9 mg/dL (ref 8.9–10.3)
Chloride: 103 mmol/L (ref 98–111)
Creatinine, Ser: 1.73 mg/dL — ABNORMAL HIGH (ref 0.44–1.00)
GFR, Estimated: 35 mL/min — ABNORMAL LOW (ref >60)
Glucose, Bld: 88 mg/dL (ref 70–99)
Potassium: 3.4 mmol/L — ABNORMAL LOW (ref 3.5–5.1)
Sodium: 141 mmol/L (ref 135–145)

## 2022-06-07 LAB — MAGNESIUM: Magnesium: 2.2 mg/dL (ref 1.7–2.4)

## 2022-06-07 MED ORDER — TORSEMIDE 20 MG PO TABS: 20.0000 mg | ORAL_TABLET | Freq: Every day | ORAL | 1 refills | Status: DC

## 2022-06-07 MED ORDER — HYDRALAZINE HCL 25 MG PO TABS: 25.0000 mg | ORAL_TABLET | Freq: Three times a day (TID) | ORAL | 1 refills | Status: AC

## 2022-06-07 MED ORDER — GUAIFENESIN 100 MG/5ML PO LIQD: 5.0000 mL | ORAL | 0 refills | Status: DC | PRN

## 2022-06-07 MED ORDER — POTASSIUM CHLORIDE CRYS ER 20 MEQ PO TBCR
30.0000 meq | EXTENDED_RELEASE_TABLET | ORAL | Status: AC
  Administered 2022-06-07 (×2): 30 meq via ORAL
  Filled 2022-06-07 (×2): qty 1

## 2022-06-07 MED ORDER — BENZONATATE 200 MG PO CAPS: 200.0000 mg | ORAL_CAPSULE | Freq: Three times a day (TID) | ORAL | 0 refills | Status: AC

## 2022-06-07 MED ORDER — AMLODIPINE BESYLATE 10 MG PO TABS: 10.0000 mg | ORAL_TABLET | Freq: Every day | ORAL | 1 refills | Status: DC

## 2022-06-07 MED ORDER — CARVEDILOL 12.5 MG PO TABS: 12.5000 mg | ORAL_TABLET | Freq: Two times a day (BID) | ORAL | 1 refills | Status: DC

## 2022-06-07 NOTE — Progress Notes (Addendum)
0846 SATURATION QUALIFICATIONS: (This note is used to comply with regulatory documentation for home oxygen)  Patient Saturations on Room Air at Rest = 97%  Patient Saturations on Room Air while Ambulating = 93-96%  After ambulating pt sitting at rest O2 98%  Patient Saturations on 0 Liters of oxygen while Ambulating = 93-96%%  Please briefly explain why patient needs home oxygen:  1230 Placed TED hose at this time  1440 Orthostatic vitals negative. K+ PO given to pt.  1443 D/C AVS completed and reviewed with pt. All opportunities for questions answered and clarified. IV removed. Pt will be wheeled down to car at medical mall entrance via wheelchair.

## 2022-06-07 NOTE — Plan of Care (Signed)
  Problem: Education: Goal: Ability to demonstrate management of disease process will improve Outcome: Progressing   Problem: Activity: Goal: Capacity to carry out activities will improve Outcome: Progressing   Problem: Activity: Goal: Ability to tolerate increased activity will improve Outcome: Progressing   Problem: Respiratory: Goal: Ability to maintain a clear airway will improve Outcome: Progressing

## 2022-06-07 NOTE — Discharge Summary (Signed)
Physician Discharge Summary  Sheila ArcherRhonda Renee Hays ZOX:[id number] DOB: 12-May-1971  PCP: [facility]  Admitted from: Home Discharged to: Home  Admit date: 06/02/2022 Discharge date: 06/07/2022  Recommendations for Outpatient Follow-up:    Follow-up Information     [facility] Schedule an appointment as soon as possible for a visit in 1 week(s).   Why: To be seen with repeat labs (CBC & BMP). Contact information: [address] [place] [address] [phone]         [place], Sheila M, MD. Schedule an appointment as soon as possible for a visit in 2 week(s).   Specialty: Cardiology Contact information: [address] [place] [address] [place] [address] [phone]                  Home Health: None    Equipment/Devices: Bilateral knee-high compression stockings.    Discharge Condition: Improved and stable   Code Status: Full Code Diet recommendation:  Discharge Diet Orders (From admission, onward)     Start     Ordered   06/07/22 0000  Diet - low sodium heart healthy        06/07/22 1423             Discharge Diagnoses:  Principal Problem:   Influenza A Active Problems:   Acute respiratory failure with hypoxia (HCC)   CAP (community acquired pneumonia)   Acute on chronic systolic CHF (congestive heart failure) (HCC)   Elevated troponin   Hypertensive urgency   Obesity, Class III, BMI 40-49.9 (morbid obesity) (HCC)   Complex partial seizure disorder (HCC)   Pulmonary hypertension, unspecified (HCC)   Stage 3b chronic kidney disease (HCC)   OSA (obstructive sleep apnea)   Hypoxia   Acute congestive heart failure (HCC)   COPD exacerbation (HCC)   Brief Summary: 52 year old female, lives with her family, independent, PMH of chronic diastolic CHF, reported COPD, not on home oxygen, former tobacco use (quit 20 years ago), CKD stage IIIb, seizure, HTN, morbid obesity, untreated OSA, severe pulmonary hypertension,  presented to the ED on 06/02/2022 with complaints of cough and dyspnea.  She was found to have influenza A.  Chest x-ray was suggestive of possible left lower lobe pneumonia.  Treated for acute bronchitis secondary to influenza A complicated by COPD exacerbation.  Patient developed chest pain on 1/10 night, cardiology consulted.  Assessment & Plan:    Influenza A acute bronchitis/pneumonia, with COPD exacerbation: Procalcitonin negative, antibiotics discontinued.  Completed course of Tamiflu.  Got a dose of IV Solu-Medrol followed by completion of course of oral prednisone. Incentive spirometry and flutter valve. Bronchodilator nebs and supportive care.  Bacterial pneumonia ruled out.  Recommend repeating chest x-ray in 4 weeks to ensure resolution of pneumonia findings.  PPI increased to twice daily in case she has LPR (although felt less likely) complicating her wheezing.  Clinically improved.   Acute respiratory failure with hypoxia:  Multifactorial due to acute bronchitis, COPD exacerbation, underlying untreated OSA, pulmonary hypertension and probable CHF exacerbation earlier.  Hypoxia resolved.  Did ambulate reevaluation on day of discharge and did not qualify for home oxygen and there was no hypoxia.   Acute on chronic diastolic CHF/severe pulmonary hypertension: History of cardiomyopathy.  LVEF 25-30% in 2016.  LVEF 55-60% in June 2023.  Repeat echo 1/9: LVEF 50-55% with grade 2 diastolic dysfunction.  Was treated with IV Lasix since admission for a couple of days and then was held due to bump in creatinine.  Clinically volume status difficult to accurately estimate but appears euvolemic.  Intake output charting is inaccurate.  Moreover creatinine has been trending up, 1.64 but has plateaued.  BNP down from 1074-150.  No longer has systolic CHF.  Cardiology follow-up appreciated.  Creatinine down to 1.51 on 1/12.  He resumed home torsemide at 20 mg daily (prior home dose was twice daily) and signed  off.  Improved.  Creatinine up slightly to 1.73, BMP can be followed up closely as outpatient, discussed with cardiology team today and they agree.  No change in meds.   Elevated troponin secondary to demand ischemia/atypical and typical chest pain: This had peaked to 167 and has been trending down even last night despite reported chest pain.  Patient reports almost 1 month history of waking up early hours of the morning with sharp left-sided nonradiating chest pain which relieves after sitting up or taking deep breaths.  Right heart cath in 2023 showed severe pulmonary hypertension and diuresed with IV Lasix.  Nuclear medicine stress test in 2019 was low risk.  Overnight 1/10 chest pain which was reproducible but also responded to sublingual NTG.  Cardiology input appreciated and will consider ischemic workup as outpatient.  They suspect troponin elevation due to demand mismatch.   Hypertensive urgency: Continue amlodipine 10 Mg daily, carvedilol 12.5 Mg twice daily and Cardiology added hydralazine 25 Mg 3 times daily.  Blood pressures better controlled.  Orthostatic hypotension: On morning of discharge, patient reported lightheadedness on ambulation.  Was clearly orthostatic (supine BP 148/86, sitting 140/87, standing 120/82, 3 minutes after standing 114/76).  Clinically euvolemic.  May be related to polypharmacy antihypertensives but needs them for her marked hypertension.  No room for increasing carvedilol due to heart rates in the low 60s.  Applied bilateral knee-high compression stockings with improvement in orthostatics (supine BP 137/76, sitting 136/84, standing 132/76 and 3 minutes after standing 124/68).  Patient was counseled extensively regarding precautions and techniques for managing orthostatic hypotension and she verbalized understanding.  Outpatient follow-up.   Hypokalemia: This was read place prior to discharge.  Follow BMP as outpatient.  Magnesium 2.2.   Normocytic  anemia: Outpatient follow-up.   OSA (obstructive sleep apnea) Not yet on CPAP, follows outpatient pulmonary   Stage 3b chronic kidney disease (HCC) Renal function at baseline, 1.4.  Creatinine has bumped up to 1.6 >1.6 >1.5 >1.73.  Likely due to acute illness, IV Lasix diuresis.  Closely follow BMP as outpatient.   Pulmonary hypertension (HCC) Followed by pulmonology, patient indicates that she has not seen one yet and has an upcoming appointment   Complex partial seizure disorder (HCC) Continue Lamictal and Trileptal   Body mass index is 42.82 kg/Hays./Morbid obesity Complicating factor to overall prognosis and care     Consultations: Cardiology  Procedures: None   Discharge Instructions  Discharge Instructions     (HEART FAILURE PATIENTS) Call MD:  Anytime you have any of the following symptoms: 1) 3 pound weight gain in 24 hours or 5 pounds in 1 week 2) shortness of breath, with or without a dry hacking cough 3) swelling in the hands, feet or stomach 4) if you have to sleep on extra pillows at night in order to breathe.   Complete by: As directed    Call MD for:  difficulty breathing, headache or visual disturbances   Complete by: As directed    Call MD for:  extreme fatigue   Complete by: As directed    Call MD for:  persistant  dizziness or light-headedness   Complete by: As directed    Call MD for:  persistant nausea and vomiting   Complete by: As directed    Call MD for:  severe uncontrolled pain   Complete by: As directed    Call MD for:  temperature >100.4   Complete by: As directed    Diet - low sodium heart healthy   Complete by: As directed    Discharge instructions   Complete by: As directed    Continue to use bilateral leg compression stockings as per instructions.   Increase activity slowly   Complete by: As directed         Medication List     STOP taking these medications    empagliflozin 10 MG Tabs tablet Commonly known as: Jardiance    Linzess 145 MCG Caps capsule Generic drug: linaclotide       TAKE these medications    acetaminophen 500 MG tablet Commonly known as: TYLENOL Take 500 mg by mouth every 6 (six) hours as needed.   Advair Diskus 250-50 MCG/ACT Aepb Generic drug: fluticasone-salmeterol Inhale 1 puff into the lungs 2 (two) times daily.   albuterol 108 (90 Base) MCG/ACT inhaler Commonly known as: VENTOLIN HFA Inhale 2 puffs into the lungs every 4 (four) hours as needed.   amLODipine 10 MG tablet Commonly known as: NORVASC Take 1 tablet (10 mg total) by mouth daily. Start taking on: June 08, 2022   ascorbic acid 500 MG tablet Commonly known as: VITAMIN C Take 500 mg by mouth daily.   aspirin EC 81 MG tablet Take 81 mg by mouth daily.   benzonatate 200 MG capsule Commonly known as: TESSALON Take 1 capsule (200 mg total) by mouth 3 (three) times daily for 4 days.   calcium carbonate 500 MG chewable tablet Commonly known as: TUMS - dosed in mg elemental calcium Chew 1 tablet (200 mg of elemental calcium total) by mouth 3 (three) times daily as needed for indigestion or heartburn.   carvedilol 12.5 MG tablet Commonly known as: COREG Take 1 tablet (12.5 mg total) by mouth 2 (two) times daily with a meal. What changed:  medication strength how much to take when to take this   folic acid 1 MG tablet Commonly known as: FOLVITE Take 0.5 tablets (0.5 mg total) by mouth daily.   guaiFENesin 100 MG/5ML liquid Commonly known as: ROBITUSSIN Take 5 mLs by mouth every 4 (four) hours as needed for cough or to loosen phlegm.   hydrALAZINE 25 MG tablet Commonly known as: APRESOLINE Take 1 tablet (25 mg total) by mouth 3 (three) times daily.   ipratropium-albuterol 0.5-2.5 (3) MG/3ML Soln Commonly known as: DUONEB Take 3 mLs by nebulization every 6 (six) hours as needed.   lamoTRIgine 25 MG tablet Commonly known as: LAMICTAL Take 25 mg by mouth 2 (two) times daily.   montelukast 10 MG  tablet Commonly known as: SINGULAIR Take 10 mg by mouth daily.   omeprazole 20 MG capsule Commonly known as: PRILOSEC Take 20 mg by mouth daily.   oxcarbazepine 600 MG tablet Commonly known as: TRILEPTAL Take 600 mg by mouth 2 (two) times daily.   rosuvastatin 20 MG tablet Commonly known as: CRESTOR Take 20 mg by mouth at bedtime.   torsemide 20 MG tablet Commonly known as: DEMADEX Take 1 tablet (20 mg total) by mouth daily. What changed: when to take this   traZODone 100 MG tablet Commonly known as: DESYREL Take 100 mg by  mouth at bedtime as needed for sleep.       Allergies  Allergen Reactions   Ciprofloxacin Itching      Procedures/Studies: DG Chest Port 1 View  Result Date: 06/05/2022 CLINICAL DATA:  Chest pain EXAM: PORTABLE CHEST 1 VIEW COMPARISON:  06/02/2022 FINDINGS: Pulmonary insufflation is normal and stable since prior examination. There is progressive streaky infiltrate within the right perihilar region and lung base in keeping with developing pneumonic infiltrate in the appropriate clinical setting. No pneumothorax or pleural effusion. Cardiac size is mildly enlarged, unchanged. No acute bone abnormality. IMPRESSION: 1. Progressive streaky infiltrate within the right perihilar region and lung base in keeping with developing pneumonic infiltrate in the appropriate clinical setting. Electronically Signed   By: Helyn NumbersAshesh  Parikh Hays.D.   On: 06/05/2022 01:51   ECHOCARDIOGRAM COMPLETE  Result Date: 06/04/2022    ECHOCARDIOGRAM REPORT   Patient Name:   [facility]EN[facility] Hays Date of Exam: 06/03/2022 Medical Rec #:  [id number]          Height:       65.0 in Accession #:    [id number]         Weight:       266.3 lb Date of Birth:  November 20, 1970          BSA:          2.234 Hays Patient Age:    51 years           BP:           179/91 mmHg Patient Gender: F                  HR:           89 bpm. Exam Location:  [facility] Procedure: 2D Echo, Cardiac Doppler and Color Doppler Indications:      CHF  History:         Patient has prior history of Echocardiogram examinations, most                  recent 11/21/2021. CHF, Signs/Symptoms:Chest Pain and                  Dizziness/Lightheadedness; Risk Factors:Hypertension. Flu +.  Sonographer:     Mikki Harbororothy Buchanan Referring Phys:  [id number] Andris BaumannHAZEL V DUNCAN Diagnosing Phys: Yvonne Kendallhristopher End MD  Sonographer Comments: Patient is obese. Image acquisition challenging due to respiratory motion. IMPRESSIONS  1. Left ventricular ejection fraction, by estimation, is 50 to 55%. The left ventricle has low normal function. The left ventricle has no regional wall motion abnormalities. The left ventricular internal cavity size was mildly to moderately dilated. There is moderate left ventricular hypertrophy. Left ventricular diastolic parameters are consistent with Grade II diastolic dysfunction (pseudonormalization). Elevated left atrial pressure.  2. Right ventricular systolic function is normal. The right ventricular size is normal.  3. Left atrial size was mildly dilated.  4. Right atrial size was mildly dilated.  5. The mitral valve is degenerative. Trivial mitral valve regurgitation.  6. The aortic valve is tricuspid. Aortic valve regurgitation is not visualized. No aortic stenosis is present.  7. The inferior vena cava is normal in size with greater than 50% respiratory variability, suggesting right atrial pressure of 3 mmHg. FINDINGS  Left Ventricle: Left ventricular ejection fraction, by estimation, is 50 to 55%. The left ventricle has low normal function. The left ventricle has no regional wall motion abnormalities. The left ventricular internal cavity size was mildly to moderately  dilated. There is  moderate left ventricular hypertrophy. Left ventricular diastolic parameters are consistent with Grade II diastolic dysfunction (pseudonormalization). Elevated left atrial pressure. Right Ventricle: The right ventricular size is normal. No increase in right ventricular wall  thickness. Right ventricular systolic function is normal. Left Atrium: Left atrial size was mildly dilated. Right Atrium: Right atrial size was mildly dilated. Pericardium: Trivial pericardial effusion is present. Mitral Valve: The mitral valve is degenerative in appearance. There is mild thickening of the mitral valve leaflet(s). Mild mitral annular calcification. Trivial mitral valve regurgitation. MV peak gradient, 9.4 mmHg. The mean mitral valve gradient is 4.0 mmHg. Tricuspid Valve: The tricuspid valve is not well visualized. Tricuspid valve regurgitation is not demonstrated. Aortic Valve: The aortic valve is tricuspid. Aortic valve regurgitation is not visualized. No aortic stenosis is present. Aortic valve mean gradient measures 6.0 mmHg. Aortic valve peak gradient measures 9.2 mmHg. Aortic valve area, by VTI measures 2.55 cm. Pulmonic Valve: The pulmonic valve was normal in structure. Pulmonic valve regurgitation is not visualized. No evidence of pulmonic stenosis. Aorta: The aortic root is normal in size and structure. Pulmonary Artery: The pulmonary artery is not well seen. Venous: The inferior vena cava is normal in size with greater than 50% respiratory variability, suggesting right atrial pressure of 3 mmHg. IAS/Shunts: The interatrial septum was not well visualized.  LEFT VENTRICLE PLAX 2D LVIDd:         5.70 cm   Diastology LVIDs:         4.20 cm   LV e' lateral:   6.96 cm/s LV PW:         1.50 cm   LV E/e' lateral: 18.5 LV IVS:        1.30 cm LVOT diam:     2.10 cm LV SV:         82 LV SV Index:   37 LVOT Area:     3.46 cm  RIGHT VENTRICLE RV Basal diam:  3.45 cm RV Mid diam:    3.70 cm RV S prime:     17.50 cm/s TAPSE (Hays-mode): 2.9 cm LEFT ATRIUM             Index        RIGHT ATRIUM           Index LA diam:        5.30 cm 2.37 cm/Hays   RA Area:     17.80 cm LA Vol (A2C):   68.5 ml 30.66 ml/Hays  RA Volume:   43.60 ml  19.52 ml/Hays LA Vol (A4C):   62.1 ml 27.80 ml/Hays LA Biplane Vol: 68.9 ml 30.84  ml/Hays  AORTIC VALVE                     PULMONIC VALVE AV Area (Vmax):    2.44 cm      PV Vmax:       1.19 Hays/s AV Area (Vmean):   2.35 cm      PV Peak grad:  5.7 mmHg AV Area (VTI):     2.55 cm AV Vmax:           152.00 cm/s AV Vmean:          112.000 cm/s AV VTI:            0.320 Hays AV Peak Grad:      9.2 mmHg AV Mean Grad:      6.0 mmHg LVOT Vmax:         107.00  cm/s LVOT Vmean:        76.000 cm/s LVOT VTI:          0.236 Hays LVOT/AV VTI ratio: 0.74  AORTA Ao Root diam: 3.20 cm MITRAL VALVE MV Area (PHT): 4.89 cm     SHUNTS MV Area VTI:   2.95 cm     Systemic VTI:  0.24 Hays MV Peak grad:  9.4 mmHg     Systemic Diam: 2.10 cm MV Mean grad:  4.0 mmHg MV Vmax:       1.53 Hays/s MV Vmean:      84.3 cm/s MV Decel Time: 155 msec MV E velocity: 129.00 cm/s MV A velocity: 114.00 cm/s MV E/A ratio:  1.13 Yvonne Kendall MD Electronically signed by Yvonne Kendall MD Signature Date/Time: 06/04/2022/7:08:53 AM    Final    DG Chest 2 View  Result Date: 06/02/2022 CLINICAL DATA:  Shortness of breath EXAM: CHEST - 2 VIEW COMPARISON:  CXR 11/30/21 FINDINGS: No pleural effusion. No pneumothorax. There is a focal opacity at the right lung base, new from prior exam. Unchanged enlarged cardiac contours. No displaced rib fractures. Visualized upper abdomen is unremarkable. Vertebral body heights are maintained. IMPRESSION: Focal opacity at the right lung base, new from prior exam, and worrisome for infection. Electronically Signed   By: Lorenza Cambridge Hays.D.   On: 06/02/2022 15:21      Subjective: No chest pain, dyspnea or wheezing.  Reported some lightheadedness while ambulating.  No syncope or near syncope.  No palpitations.  Discharge Exam:  Vitals:   06/06/22 2123 06/07/22 0033 06/07/22 0737 06/07/22 0854  BP: (!) 148/76 120/78  (!) 175/97  Pulse: 67 66  69  Resp:  18  19  Temp:  98.3 F (36.8 C)  98.1 F (36.7 C)  TempSrc:      SpO2: 100% 96% 98% 95%  Weight:      Height:        General exam: Young female,  moderately built and very morbidly obese sitting up comfortably in bed and was speaking with someone on her phone when I walked in.  Appeared to be in good spirits.  No distress noted. Respiratory system: Much improved.  Clear to auscultation without wheezing, rhonchi or crackles.  No increased work of breathing. Cardiovascular system: S1 & S2 heard, RRR. No JVD, murmurs, rubs, gallops or clicks. No pedal edema.  Telemetry personally reviewed: Sinus rhythm. Gastrointestinal system: Abdomen is nondistended, soft and nontender. No organomegaly or masses felt. Normal bowel sounds heard. Central nervous system: Alert and oriented. No focal neurological deficits. Extremities: Symmetric 5 x 5 power. Skin: No rashes, lesions or ulcers Psychiatry: Judgement and insight appear normal. Mood & affect appropriate.     The results of significant diagnostics from this hospitalization (including imaging, microbiology, ancillary and laboratory) are listed below for reference.     Microbiology: Recent Results (from the past 240 hour(s))  Resp panel by RT-PCR (RSV, Flu A&B, Covid) Anterior Nasal Swab     Status: Abnormal   Collection Time: 06/02/22  3:07 PM   Specimen: Anterior Nasal Swab  Result Value Ref Range Status   SARS Coronavirus 2 by RT PCR NEGATIVE NEGATIVE Final    Comment: (NOTE) SARS-CoV-2 target nucleic acids are NOT DETECTED.  The SARS-CoV-2 RNA is generally detectable in upper respiratory specimens during the acute phase of infection. The lowest concentration of SARS-CoV-2 viral copies this assay can detect is 138 copies/mL. A negative result does not preclude SARS-Cov-2 infection  and should not be used as the sole basis for treatment or other patient management decisions. A negative result may occur with  improper specimen collection/handling, submission of specimen other than nasopharyngeal swab, presence of viral mutation(s) within the areas targeted by this assay, and inadequate  number of viral copies(<138 copies/mL). A negative result must be combined with clinical observations, patient history, and epidemiological information. The expected result is Negative.  Fact Sheet for Patients:  EntrepreneurPulse.com.au  Fact Sheet for Healthcare Providers:  IncredibleEmployment.be  This test is no t yet approved or cleared by the [place] FDA and  has been authorized for detection and/or diagnosis of SARS-CoV-2 by FDA under an Emergency Use Authorization (EUA). This EUA will remain  in effect (meaning this test can be used) for the duration of the COVID-19 declaration under Section 564(b)(1) of the Act, 21 U.S.C.section 360bbb-3(b)(1), unless the authorization is terminated  or revoked sooner.       Influenza A by PCR POSITIVE (A) NEGATIVE Final   Influenza B by PCR NEGATIVE NEGATIVE Final    Comment: (NOTE) The Xpert Xpress SARS-CoV-2/FLU/RSV plus assay is intended as an aid in the diagnosis of influenza from Nasopharyngeal swab specimens and should not be used as a sole basis for treatment. Nasal washings and aspirates are unacceptable for Xpert Xpress SARS-CoV-2/FLU/RSV testing.  Fact Sheet for Patients: EntrepreneurPulse.com.au  Fact Sheet for Healthcare Providers: IncredibleEmployment.be  This test is not yet approved or cleared by the [place] FDA and has been authorized for detection and/or diagnosis of SARS-CoV-2 by FDA under an Emergency Use Authorization (EUA). This EUA will remain in effect (meaning this test can be used) for the duration of the COVID-19 declaration under Section 564(b)(1) of the Act, 21 U.S.C. section 360bbb-3(b)(1), unless the authorization is terminated or revoked.     Resp Syncytial Virus by PCR NEGATIVE NEGATIVE Final    Comment: (NOTE) Fact Sheet for Patients: EntrepreneurPulse.com.au  Fact Sheet for Healthcare  Providers: IncredibleEmployment.be  This test is not yet approved or cleared by the [place] FDA and has been authorized for detection and/or diagnosis of SARS-CoV-2 by FDA under an Emergency Use Authorization (EUA). This EUA will remain in effect (meaning this test can be used) for the duration of the COVID-19 declaration under Section 564(b)(1) of the Act, 21 U.S.C. section 360bbb-3(b)(1), unless the authorization is terminated or revoked.  Performed at [facility], [address] [place], [place] [address]   Blood culture (routine x 2)     Status: None   Collection Time: 06/02/22  7:46 PM   Specimen: BLOOD RIGHT ARM  Result Value Ref Range Status   Specimen Description BLOOD RIGHT ARM  Final   Special Requests   Final    BOTTLES DRAWN AEROBIC AND ANAEROBIC Blood Culture adequate volume   Culture   Final    NO GROWTH 5 DAYS Performed at [facility], [address] [place], [place] [address]    Report Status 06/07/2022 FINAL  Final  Blood culture (routine x 2)     Status: None (Preliminary result)   Collection Time: 06/03/22  3:29 AM   Specimen: BLOOD  Result Value Ref Range Status   Specimen Description BLOOD RIGHT HAND  Final   Special Requests   Final    BOTTLES DRAWN AEROBIC AND ANAEROBIC Blood Culture results may not be optimal due to an inadequate volume of blood received in culture bottles   Culture   Final    NO GROWTH  4 DAYS Performed at [facility], [address] [place], [place] [address]    Report Status PENDING  Incomplete     Labs: CBC: Recent Labs  Lab 06/02/22 1448  WBC 4.7  HGB 11.9*  HCT 38.9  MCV 93.5  PLT 742    Basic Metabolic Panel: Recent Labs  Lab 06/03/22 0329 06/04/22 0301 06/05/22 0206 06/06/22 0413 06/07/22 0558  NA 137 139 139 136 141  K 3.6 3.4* 3.5 3.3* 3.4*  CL 101 99 100 100 103  CO2 26 31 28 27 28   GLUCOSE 98 118* 79 90 88  BUN 19 29* 33* 31* 34*  CREATININE  1.41* 1.64* 1.63* 1.51* 1.73*  CALCIUM 8.7* 8.9 8.5* 8.6* 8.9  MG  --  2.1 2.0 2.1 2.2    Liver Function Tests: Recent Labs  Lab 06/02/22 1506  AST 30  ALT 23  ALKPHOS 82  BILITOT 0.8  PROT 7.6  ALBUMIN 3.7    Urinalysis    Component Value Date/Time   COLORURINE YELLOW (A) 06/14/2021 1830   APPEARANCEUR CLEAR (A) 06/14/2021 1830   LABSPEC 1.011 06/14/2021 1830   PHURINE 5.0 06/14/2021 1830   GLUCOSEU NEGATIVE 06/14/2021 1830   HGBUR NEGATIVE 06/14/2021 1830   BILIRUBINUR NEGATIVE 06/14/2021 1830   KETONESUR NEGATIVE 06/14/2021 1830   PROTEINUR 100 (A) 06/14/2021 1830   NITRITE NEGATIVE 06/14/2021 1830   LEUKOCYTESUR LARGE (A) 06/14/2021 1830      Time coordinating discharge: 35 minutes  SIGNED:  Vernell Leep, MD,  FACP, [place], [place], [facility], [place] [facility]   [facility] [employer]     To contact the attending provider between 7A-7P or the covering provider during after hours 7P-7A, please log into the web site [email or web address] and access using universal [place] password for that web site. If you do not have the password, please call the hospital operator.

## 2022-06-07 NOTE — TOC Progression Note (Signed)
Transition of Care [facility]) - Progression Note    Patient Details  Name: Sheila Hays MRN: [id number] Date of Birth: November 05, 1970  Transition of Care [facility]) CM/SW Contact  Izola Price, RN Phone Number: 06/07/2022, 2:57 PM  Clinical Narrative: 1/13: DC today to home/self care. Oxygen saturations done per Unit RN prior to discharge. Transportation was arranged. Simmie Davies RN CM       Barriers to Discharge: Continued Medical Work up  Expected Discharge Plan and Services   Discharge Planning Services:  (waiting on recommendations)   Living arrangements for the past 2 months: Single Family Home Expected Discharge Date: 06/07/22               DME Arranged: N/A DME Agency: NA         HH Agency: NA         Social Determinants of Health (SDOH) Interventions SDOH Screenings   Food Insecurity: No Food Insecurity (06/03/2022)  Housing: Low Risk  (06/03/2022)  Transportation Needs: No Transportation Needs (06/03/2022)  Utilities: Not At Risk (06/03/2022)  Depression (PHQ2-9): Low Risk  (12/25/2021)  Tobacco Use: Medium Risk (06/03/2022)    Readmission Risk Interventions     No data to display

## 2022-06-07 NOTE — Plan of Care (Signed)
  Problem: Activity: Goal: Capacity to carry out activities will improve Outcome: Progressing   Problem: Respiratory: Goal: Ability to maintain a clear airway will improve Outcome: Progressing Goal: Levels of oxygenation will improve Outcome: Progressing Goal: Ability to maintain adequate ventilation will improve Outcome: Progressing   

## 2022-06-08 LAB — CULTURE, BLOOD (ROUTINE X 2): Culture: NO GROWTH

## 2022-06-13 ENCOUNTER — Other Ambulatory Visit (HOSPITAL_COMMUNITY): Payer: Self-pay

## 2022-06-13 ENCOUNTER — Telehealth: Payer: Self-pay | Admitting: Family

## 2022-06-13 ENCOUNTER — Encounter: Payer: Self-pay | Admitting: Family

## 2022-06-13 ENCOUNTER — Encounter: Payer: Self-pay | Admitting: Pharmacy Technician

## 2022-06-13 ENCOUNTER — Ambulatory Visit: Payer: 59 | Attending: Family | Admitting: Family

## 2022-06-13 VITALS — BP 168/85 | HR 76 | Resp 20 | Wt 258.1 lb

## 2022-06-13 DIAGNOSIS — N183 Chronic kidney disease, stage 3 unspecified: Secondary | ICD-10-CM | POA: Insufficient documentation

## 2022-06-13 DIAGNOSIS — K219 Gastro-esophageal reflux disease without esophagitis: Secondary | ICD-10-CM | POA: Diagnosis not present

## 2022-06-13 DIAGNOSIS — I130 Hypertensive heart and chronic kidney disease with heart failure and stage 1 through stage 4 chronic kidney disease, or unspecified chronic kidney disease: Secondary | ICD-10-CM | POA: Diagnosis not present

## 2022-06-13 DIAGNOSIS — Z79899 Other long term (current) drug therapy: Secondary | ICD-10-CM | POA: Insufficient documentation

## 2022-06-13 DIAGNOSIS — G40209 Localization-related (focal) (partial) symptomatic epilepsy and epileptic syndromes with complex partial seizures, not intractable, without status epilepticus: Secondary | ICD-10-CM

## 2022-06-13 DIAGNOSIS — I10 Essential (primary) hypertension: Secondary | ICD-10-CM

## 2022-06-13 DIAGNOSIS — Z87891 Personal history of nicotine dependence: Secondary | ICD-10-CM | POA: Insufficient documentation

## 2022-06-13 DIAGNOSIS — I5032 Chronic diastolic (congestive) heart failure: Secondary | ICD-10-CM

## 2022-06-13 DIAGNOSIS — R569 Unspecified convulsions: Secondary | ICD-10-CM | POA: Diagnosis not present

## 2022-06-13 MED ORDER — SACUBITRIL-VALSARTAN 24-26 MG PO TABS: 1.0000 | ORAL_TABLET | Freq: Two times a day (BID) | ORAL | 3 refills | Status: DC

## 2022-06-13 NOTE — Progress Notes (Signed)
[place] - PHARMACIST COUNSELING NOTE  Guideline-Directed Medical Therapy/Evidence Based Medicine  ACE/ARB/ARNI:  None Beta Blocker: Carvedilol 12.5 mg twice daily Aldosterone Antagonist:  None Diuretic: Torsemide 20 mg daily SGLT2i:  None  Adherence Assessment  Do you ever forget to take your medication? [] Yes [x] No  Do you ever skip doses due to side effects? [] Yes [x] No  Do you have trouble affording your medicines? [] Yes [x] No  Are you ever unable to pick up your medication due to transportation difficulties? [] Yes [x] No  Do you ever stop taking your medications because you don't believe they are helping? [] Yes [x] No  Do you check your weight daily? [x] Yes [] No   Adherence strategy: Keeps pills in pharmacy bottles; does not use a pill box  Barriers to obtaining medications: None  Vital signs: HR 95, BP 145/83, weight (pounds) 266 ECHO: Date 05/2022, EF 50-55%     Latest Ref Rng & Units 06/07/2022    5:58 AM 06/06/2022    4:13 AM 06/05/2022    2:06 AM  BMP  Glucose 70 - 99 mg/dL 88  90  79   BUN 6 - 20 mg/dL 34  31  33   Creatinine 0.44 - 1.00 mg/dL 1.73  1.51  1.63   Sodium 135 - 145 mmol/L 141  136  139   Potassium 3.5 - 5.1 mmol/L 3.4  3.3  3.5   Chloride 98 - 111 mmol/L 103  100  100   CO2 22 - 32 mmol/L 28  27  28    Calcium 8.9 - 10.3 mg/dL 8.9  8.6  8.5     Past Medical History:  Diagnosis Date   CHF (congestive heart failure) (HCC)    Chronic kidney disease (CKD), stage III (moderate) (HCC)    GERD (gastroesophageal reflux disease)    Hypertension    Seizures ([place])    Followed at [facility]    ASSESSMENT 52 year old female with PMH seizure disorder, HTN, CKD3b (BL Scr 1.4-1.7, eGFR 33) who presents to the HF clinic for follow-up. Most recent ECHO in 05/2022 shows EF 50-55%. Patient takes carvedilol 12.5 mg twice daily and torsemide 20 mg daily. Recently prescribed Jardiance but was then subsequently hospitalized  with flu and the medication was discontinued. Patient's blood pressure was elevated today, and during medication reconciliation I discovered that she is not taking amlodipine nor lamotrigine. Patient's BP is elevated today and most recent K was low, 3.4.  Recent ED Visit (past 6 months): Date - 05/2022, CC - Hypoxia Date - 10/2021, CC - AoCHF  PLAN CHF/HTN Recommend initiation of Entresto 24/26mg  as GDMT for HFpEF in females regardless of EF (and will hopefully bump potassium) $0 copay for 90 days Currently in the process of receiving labs drawn at PCP yesterday Follow up appointment and BMP in 3 weeks Continue Coreg, torsemide, hydralazine Recommend consideration of re-initiation of Jardiance 10 mg at next visit EMPA-KIDNEY trial demonstrated that empagliflozin led to a lower risk of progression of kidney disease or death from cardiovascular causes Without additional labs, eGFR was recently very near threshold below which we would not start spironolactone   Time spent: 15 minutes  Will M. Ouida Sills, PharmD PGY-1 Pharmacy Resident 06/13/2022 9:01 AM   Current Outpatient Medications:    acetaminophen (TYLENOL) 500 MG tablet, Take 500 mg by mouth every 6 (six) hours as needed., Disp: , Rfl:    ADVAIR DISKUS 250-50 MCG/ACT AEPB, Inhale 1 puff into the lungs 2 (two)  times daily., Disp: , Rfl:    albuterol (VENTOLIN HFA) 108 (90 Base) MCG/ACT inhaler, Inhale 2 puffs into the lungs every 4 (four) hours as needed., Disp: , Rfl:    amLODipine (NORVASC) 10 MG tablet, Take 1 tablet (10 mg total) by mouth daily., Disp: 30 tablet, Rfl: 1   aspirin EC 81 MG tablet, Take 81 mg by mouth daily., Disp: , Rfl:    calcium carbonate (TUMS - DOSED IN MG ELEMENTAL CALCIUM) 500 MG chewable tablet, Chew 1 tablet (200 mg of elemental calcium total) by mouth 3 (three) times daily as needed for indigestion or heartburn., Disp: , Rfl:    carvedilol (COREG) 12.5 MG tablet, Take 1 tablet (12.5 mg total) by mouth 2  (two) times daily with a meal., Disp: 60 tablet, Rfl: 1   folic acid (FOLVITE) 1 MG tablet, Take 0.5 tablets (0.5 mg total) by mouth daily., Disp: 30 tablet, Rfl: 0   guaiFENesin (ROBITUSSIN) 100 MG/5ML liquid, Take 5 mLs by mouth every 4 (four) hours as needed for cough or to loosen phlegm., Disp: 120 mL, Rfl: 0   hydrALAZINE (APRESOLINE) 25 MG tablet, Take 1 tablet (25 mg total) by mouth 3 (three) times daily., Disp: 90 tablet, Rfl: 1   ipratropium-albuterol (DUONEB) 0.5-2.5 (3) MG/3ML SOLN, Take 3 mLs by nebulization every 6 (six) hours as needed., Disp: , Rfl:    lamoTRIgine (LAMICTAL) 25 MG tablet, Take 25 mg by mouth 2 (two) times daily., Disp: , Rfl:    montelukast (SINGULAIR) 10 MG tablet, Take 10 mg by mouth daily., Disp: , Rfl:    omeprazole (PRILOSEC) 20 MG capsule, Take 20 mg by mouth daily., Disp: , Rfl:    oxcarbazepine (TRILEPTAL) 600 MG tablet, Take 600 mg by mouth 2 (two) times daily., Disp: , Rfl:    rosuvastatin (CRESTOR) 20 MG tablet, Take 20 mg by mouth at bedtime., Disp: , Rfl:    torsemide (DEMADEX) 20 MG tablet, Take 1 tablet (20 mg total) by mouth daily., Disp: 30 tablet, Rfl: 1   traZODone (DESYREL) 100 MG tablet, Take 100 mg by mouth at bedtime as needed for sleep., Disp: , Rfl:    vitamin C (ASCORBIC ACID) 500 MG tablet, Take 500 mg by mouth daily., Disp: , Rfl:    DRUGS TO CAUTION IN HEART FAILURE  Drug or Class Mechanism  Analgesics NSAIDs COX-2 inhibitors Glucocorticoids  Sodium and water retention, increased systemic vascular resistance, decreased response to diuretics   Diabetes Medications Metformin Thiazolidinediones Rosiglitazone (Avandia) Pioglitazone (Actos) DPP4 Inhibitors Saxagliptin (Onglyza) Sitagliptin (Januvia)   Lactic acidosis Possible calcium channel blockade   Unknown  Antiarrhythmics Class I  Flecainide Disopyramide Class III Sotalol Other Dronedarone  Negative inotrope, proarrhythmic   Proarrhythmic, beta  blockade  Negative inotrope  Antihypertensives Alpha Blockers Doxazosin Calcium Channel Blockers Diltiazem Verapamil Nifedipine Central Alpha Adrenergics Moxonidine Peripheral Vasodilators Minoxidil  Increases renin and aldosterone  Negative inotrope    Possible sympathetic withdrawal  Unknown  Anti-infective Itraconazole Amphotericin B  Negative inotrope Unknown  Hematologic Anagrelide Cilostazol   Possible inhibition of PD IV Inhibition of PD III causing arrhythmias  Neurologic/Psychiatric Stimulants Anti-Seizure Drugs Carbamazepine Pregabalin Antidepressants Tricyclics Citalopram Parkinsons Bromocriptine Pergolide Pramipexole Antipsychotics Clozapine Antimigraine Ergotamine Methysergide Appetite suppressants Bipolar Lithium  Peripheral alpha and beta agonist activity  Negative inotrope and chronotrope Calcium channel blockade  Negative inotrope, proarrhythmic Dose-dependent QT prolongation  Excessive serotonin activity/valvular damage Excessive serotonin activity/valvular damage Unknown  IgE mediated hypersensitivy, calcium channel blockade  Excessive serotonin activity/valvular  damage Excessive serotonin activity/valvular damage Valvular damage  Direct myofibrillar degeneration, adrenergic stimulation  Antimalarials Chloroquine Hydroxychloroquine Intracellular inhibition of lysosomal enzymes  Urologic Agents Alpha Blockers Doxazosin Prazosin Tamsulosin Terazosin  Increased renin and aldosterone  Adapted from Page Carleene Overlie, et al. "Drugs That May Cause or Exacerbate Heart Failure: A Scientific Statement from the American Heart  Association." Circulation 2016; 244:W10-U72. DOI: 10.1161/CIR.0000000000000426   MEDICATION ADHERENCES TIPS AND STRATEGIES Taking medication as prescribed improves patient outcomes in heart failure (reduces hospitalizations, improves symptoms, increases survival) Side effects of medications can be managed by  decreasing doses, switching agents, stopping drugs, or adding additional therapy. Please let someone in the [facility] know if you have having bothersome side effects so we can modify your regimen. Do not alter your medication regimen without talking to [place].  Medication reminders can help patients remember to take drugs on time. If you are missing or forgetting doses you can try linking behaviors, using pill boxes, or an electronic reminder like an alarm on your phone or an app. Some people can also get automated phone calls as medication reminders.

## 2022-06-13 NOTE — Patient Instructions (Addendum)
Continue weighing daily and call for an overnight weight gain of 3 pounds or more or a weekly weight gain of more than 5 pounds.   If you have voicemail, please make sure your mailbox is cleaned out so that we may leave a message and please make sure to listen to any voicemails.    If you receive a satisfaction survey regarding the Heart Failure Clinic, please take the time to fill it out. This way we can continue to provide excellent care and make any changes that need to be made.    Begin taking entresto as 1 tablet in the morning and 1 tablet in the evening.

## 2022-06-13 NOTE — Telephone Encounter (Signed)
Labs received from PCP office dated 06/11/22:  Hemoglobin 14.1 WBC 5.1 BUN 27 Creatinine 1.87 GFR 32 Sodium 142 Potassium 4.9

## 2022-06-13 NOTE — Progress Notes (Signed)
Patient ID: Sheila Hays, female    DOB: [birth year], 51 y.o.   MRN: [id number]  HPI  Sheila Hays is a 52 y/o female with a history of HTN, CKD, GERD, seizures and chronic heart failure.   Echo 06/03/22 showed an EF of 50-55% along with moderate LVH and mild LAE/RAE. Echo report from 11/21/21 reviewed and showed an EF of 55-60% along with mild LVH and mild/moderate LAE. Echo report from 11/30/20 reviewed and showed an EF of 55-60% without LVH.   RHC done 11/25/21 and showed: Hemodynamic findings consistent with severe pulmonary hypertension.  Successful right heart catheterization via the right antecubital vein. This showed evidence of severely elevated right and left-sided filling pressures, severe pulmonary hypertension and normal cardiac output.  RA: 22 mmHg RV: 92/18 with an end-diastolic pressure of 32 mmHg. PA: 95/44 with a mean of 64 mmHg PCW: 30 mmHg  Cardiac output: 7.4 with an index of 3.16. Pulmonary vascular resistance: 4.5 [place] units  Admitted 06/02/22 due to SOB/ cough due to bronchitis due to influenza.   She presents today for a follow-up visit with a chief complaint of minimal fatigue upon moderate exertion. Describes this as chronic although improving since her recent admission. Has associated cough, occasional palpitations and intermittent dizziness along with this. Denies any difficulty sleeping, abdominal distention, pedal edema, chest pain, shortness of breath or weight gain.   Past Medical History:  Diagnosis Date   CHF (congestive heart failure) (HCC)    Chronic kidney disease (CKD), stage III (moderate) (HCC)    GERD (gastroesophageal reflux disease)    Hypertension    Seizures ([place])    Followed at [facility]   Past Surgical History:  Procedure Laterality Date   RIGHT HEART CATH N/A 11/25/2021   Procedure: RIGHT HEART CATH;  Surgeon: Wellington Hampshire, MD;  Location: [place] [facility];  Service: Cardiovascular;  Laterality: N/A;   Family History  Problem Relation  Age of Onset   Hypertension Mother    Hypertension Father    Heart failure Sister    Diabetes Sister    Seizures Sister    Breast cancer Neg Hx    Social History   Tobacco Use   Smoking status: Former   Smokeless tobacco: Former    Quit date: 03/06/2017  Substance Use Topics   Alcohol use: Not Currently   Allergies  Allergen Reactions   Ciprofloxacin Itching   Prior to Admission medications   Medication Sig Start Date End Date Taking? Authorizing Provider  acetaminophen (TYLENOL) 500 MG tablet Take 500 mg by mouth every 8 (eight) hours as needed for mild pain.   Yes [provider]  ADVAIR DISKUS 250-50 MCG/ACT AEPB Inhale 1 puff into the lungs 2 (two) times daily. 11/18/21  Yes [provider]  albuterol (VENTOLIN HFA) 108 (90 Base) MCG/ACT inhaler Inhale 2 puffs into the lungs every 4 (four) hours as needed. 10/16/20  Yes [provider]  aspirin EC 81 MG tablet Take 81 mg by mouth daily.   Yes [provider]  carvedilol (COREG) 12.5 MG tablet Take 1 tablet (12.5 mg total) by mouth 2 (two) times daily with a meal. 06/07/22  Yes Hongalgi, Lenis Dickinson, MD  folic acid (FOLVITE) 1 MG tablet Take 0.5 tablets (0.5 mg total) by mouth daily. 12/07/21  Yes Emeterio Reeve, DO  hydrALAZINE (APRESOLINE) 25 MG tablet Take 1 tablet (25 mg total) by mouth 3 (three) times daily. 06/07/22  Yes Hongalgi, Lenis Dickinson, MD  ipratropium-albuterol (  DUONEB) 0.5-2.5 (3) MG/3ML SOLN Take 3 mLs by nebulization every 6 (six) hours as needed. 11/20/21  Yes [provider]  montelukast (SINGULAIR) 10 MG tablet Take 10 mg by mouth daily. 11/06/21  Yes [provider]  omeprazole (PRILOSEC) 20 MG capsule Take 20 mg by mouth daily.   Yes [provider]  oxcarbazepine (TRILEPTAL) 600 MG tablet Take 600 mg by mouth 2 (two) times daily.   Yes [provider]  rosuvastatin (CRESTOR) 20 MG tablet Take 20 mg by mouth at bedtime. 12/10/21  Yes [provider]  sacubitril-valsartan (ENTRESTO) 24-26 MG Take 1 tablet by mouth 2 (two) times daily. 06/13/22  Yes Clarisa Kindred A, FNP  torsemide (DEMADEX) 20 MG tablet Take 1 tablet (20 mg total) by mouth daily. 06/07/22  Yes Hongalgi, Maximino Greenland, MD  traZODone (DESYREL) 100 MG tablet Take 100 mg by mouth at bedtime as needed for sleep.   Yes [provider]  vitamin C (ASCORBIC ACID) 500 MG tablet Take 500 mg by mouth daily.   Yes [provider]  calcium carbonate (TUMS - DOSED IN MG ELEMENTAL CALCIUM) 500 MG chewable tablet Chew 1 tablet (200 mg of elemental calcium total) by mouth 3 (three) times daily as needed for indigestion or heartburn. Patient not taking: Reported on 06/13/2022 12/06/21   Sunnie Nielsen, DO    Review of Systems  Constitutional:  Positive for fatigue. Negative for appetite change.  HENT:  Negative for congestion, postnasal drip and sore throat.   Eyes: Negative.   Respiratory:  Positive for cough. Negative for chest tightness and shortness of breath.        + snoring  Cardiovascular:  Positive for palpitations (at times). Negative for chest pain and leg swelling.  Gastrointestinal:  Negative for abdominal distention and abdominal pain.  Endocrine: Negative.   Genitourinary: Negative.   Musculoskeletal:  Negative for back pain and neck pain.  Skin: Negative.   Allergic/Immunologic: Negative.   Neurological:  Positive for dizziness (at times). Negative for light-headedness.  Hematological:  Negative for adenopathy. Does not bruise/bleed easily.  Psychiatric/Behavioral:  Negative for dysphoric mood and sleep disturbance (sleeping on 3 pillows). The patient is not nervous/anxious.    Vitals:   06/13/22 0827  BP: (!) 168/85  Pulse: 76  Resp: 20  SpO2: 97%  Weight: 258 lb 2 oz (117.1 kg)   Wt Readings from Last 3 Encounters:  06/13/22 258 lb 2 oz (117.1 kg)  06/06/22 257 lb 4.8 oz (116.7 kg)  05/30/22 266 lb 6.4 oz (120.8 kg)   Lab Results   Component Value Date   CREATININE 1.73 (H) 06/07/2022   CREATININE 1.51 (H) 06/06/2022   CREATININE 1.63 (H) 06/05/2022   Physical Exam Vitals and nursing note reviewed.  Constitutional:      Appearance: Normal appearance.  HENT:     Head: Normocephalic and atraumatic.  Cardiovascular:     Rate and Rhythm: Normal rate and regular rhythm.  Pulmonary:     Effort: Pulmonary effort is normal. No respiratory distress.     Breath sounds: Normal breath sounds. No wheezing or rales.  Abdominal:     General: There is no distension.     Palpations: Abdomen is soft.  Musculoskeletal:        General: No tenderness.     Cervical back: Normal range of motion and neck supple.     Right lower leg: No edema.     Left lower leg: No edema.  Skin:  General: Skin is warm and dry.  Neurological:     General: No focal deficit present.     Mental Status: She is alert and oriented to person, place, and time.  Psychiatric:        Mood and Affect: Mood normal.        Behavior: Behavior normal.        Thought Content: Thought content normal.    Assessment & Plan:  1: Chronic heart failure with preserved ejection fraction with LVH- - NYHA class II - euvolemic today - weighing daily; reminded to call for an overnight weight gain of > 2 pounds or a weekly weight gain of > 5 pounds - weight down 4 pounds from last visit here 5 months ago - not adding salt; reviewed the importance of reading food labels to keep her daily sodium intake to 2000mg  / day - trying to increase her activity, did walk up the stairs to the office today - saw cardiology Kathlen Mody) 05/30/22 - will begin entresto 24/26mg  BID - was on jardiance but unsure of why it was stopped; consider resuming this at next visit - BMP next visit - BNP 06/05/22 was 149.6 - PharmD reconciled meds w/ patient  2: HTN- - BP 168/85; starting entresto per above - seeing PCP at [place] just had labs done; have requested those results be  faxed to [place] - BMP 06/07/22 reviewed and showed sodium 141, potassium 3.4, creatinine 1.73 and GFR 35 - saw nephrology Karl Pock) 01/21/21  3: Seizures- - saw neurology Cornelia Copa) 12/13/20 - on trileptal   Medication list reviewed.   Return in 3 weeks, sooner if needed.

## 2022-06-20 ENCOUNTER — Encounter: Payer: Self-pay | Admitting: Internal Medicine

## 2022-07-03 ENCOUNTER — Encounter: Payer: Self-pay | Admitting: *Deleted

## 2022-07-04 ENCOUNTER — Encounter: Payer: Medicaid Other | Admitting: Family

## 2022-07-04 ENCOUNTER — Encounter: Payer: Self-pay | Admitting: Internal Medicine

## 2022-07-04 ENCOUNTER — Ambulatory Visit (INDEPENDENT_AMBULATORY_CARE_PROVIDER_SITE_OTHER): Payer: 59 | Admitting: Internal Medicine

## 2022-07-04 ENCOUNTER — Telehealth: Payer: Self-pay

## 2022-07-04 VITALS — BP 142/90 | HR 73 | Temp 98.1°F | Ht 65.0 in | Wt 258.6 lb

## 2022-07-04 DIAGNOSIS — J4489 Other specified chronic obstructive pulmonary disease: Secondary | ICD-10-CM | POA: Diagnosis not present

## 2022-07-04 DIAGNOSIS — J45991 Cough variant asthma: Secondary | ICD-10-CM | POA: Insufficient documentation

## 2022-07-04 MED ORDER — FAMOTIDINE 20 MG PO TABS: ORAL_TABLET | ORAL | 11 refills | Status: DC

## 2022-07-04 MED ORDER — PANTOPRAZOLE SODIUM 40 MG PO TBEC: 40.0000 mg | DELAYED_RELEASE_TABLET | Freq: Every day | ORAL | 2 refills | Status: DC

## 2022-07-04 MED ORDER — AIRSUPRA 90-80 MCG/ACT IN AERO: 2.0000 | INHALATION_SPRAY | RESPIRATORY_TRACT | 0 refills | Status: DC | PRN

## 2022-07-04 MED ORDER — BUDESONIDE-FORMOTEROL FUMARATE 80-4.5 MCG/ACT IN AERO: INHALATION_SPRAY | RESPIRATORY_TRACT | 12 refills | Status: DC

## 2022-07-04 NOTE — Progress Notes (Unsigned)
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 No SOB or wheezing. Cough with yellow sputum. Has been told by her PCP that she has COPD.   Dyspnea:  walks every evening around complex x 10-15 min flat/slow tol ok  Cough: esp p supper  mucus is slt yellow not so much noct once asleep nor any am flare on awakening  Sleep: props way up x year  SABA use: none   No obvious day to day or daytime pattern/variability or  mucus plugs or hemoptysis or cp or chest tightness, subjective wheeze or overt sinus or hb symptoms.   Sleeping  without nocturnal  or early am exacerbation  of respiratory  c/o's or need for noct saba. Also denies any obvious fluctuation of symptoms with weather or environmental changes or other aggravating or alleviating factors except as outlined above   No unusual exposure hx or h/o childhood pna/ asthma or knowledge of premature birth.  Current Allergies, Complete Past Medical History, Past Surgical History, Family History, and Social History were reviewed in [employer] record.  ROS  The following are not active complaints unless bolded Hoarseness, sore throat, dysphagia, dental problems, itching, sneezing,  nasal congestion or discharge of excess mucus or purulent secretions, ear ache,   fever, chills, sweats, unintended wt loss or wt gain, classically pleuritic or exertional cp,  orthopnea pnd or arm/hand swelling  or leg swelling, presyncope, palpitations, abdominal  pain, anorexia, nausea, vomiting, diarrhea  or change in bowel habits or change in bladder habits, change in stools or change in urine, dysuria, hematuria,  rash, arthralgias, visual complaints, headache, numbness, weakness or ataxia or problems with walking or coordination,  change in mood or  memory.           Past Medical History:  Diagnosis Date   CHF (congestive heart failure) (HCC)    Chronic kidney disease (CKD), stage III (moderate) (HCC)    GERD (gastroesophageal reflux disease)    Hypertension    Seizures ([place])    Followed at [facility]    Outpatient Medications Prior to Visit  Medication Sig Dispense Refill   acetaminophen (TYLENOL) 500 MG tablet Take 500 mg by mouth every 8 (eight) hours as needed for mild pain.     aspirin EC 81 MG tablet Take 81 mg by mouth daily.     carvedilol (COREG) 12.5 MG tablet Take 1 tablet (12.5 mg total) by mouth 2 (two) times daily with a meal. 60 tablet 1   folic acid (FOLVITE) 1 MG tablet Take 0.5 tablets (0.5 mg total) by mouth daily. 30 tablet 0   hydrALAZINE (APRESOLINE) 25 MG tablet Take 1 tablet (25 mg total) by mouth 3 (three) times daily. 90 tablet 1   ipratropium-albuterol (DUONEB) 0.5-2.5 (3) MG/3ML SOLN Take 3 mLs by nebulization every 6 (six) hours as needed.     montelukast (SINGULAIR) 10 MG  tablet Take 10 mg by mouth daily.     oxcarbazepine (TRILEPTAL) 600 MG tablet Take 600 mg by mouth 2 (two) times daily.     rosuvastatin (CRESTOR) 20 MG tablet Take 20 mg by mouth at bedtime.     sacubitril-valsartan (ENTRESTO) 24-26 MG Take 1 tablet by mouth 2 (two) times daily. 60 tablet 3   torsemide (DEMADEX) 20 MG tablet Take 1 tablet (20 mg total) by mouth daily. 30 tablet 1   traZODone (DESYREL) 100 MG tablet Take 100 mg by mouth at bedtime as needed for sleep.     vitamin C (ASCORBIC ACID) 500 MG tablet Take 500 mg by mouth daily.     ADVAIR DISKUS 250-50 MCG/ACT AEPB Inhale 1 puff into the lungs 2 (two) times daily.     albuterol (VENTOLIN  HFA) 108 (90 Base) MCG/ACT inhaler Inhale 2 puffs into the lungs every 4 (four) hours as needed.     omeprazole (PRILOSEC) 20 MG capsule Take 20 mg by mouth daily.     calcium carbonate (TUMS - DOSED IN MG ELEMENTAL CALCIUM) 500 MG chewable tablet Chew 1 tablet (200 mg of elemental calcium total) by mouth 3 (three) times daily as needed for indigestion or heartburn. (Patient not taking: Reported on 06/13/2022)     No facility-administered medications prior to visit.     Objective:     BP (!) 142/90 (BP Location: Left Wrist, Cuff Size: Normal)   Pulse 73   Temp 98.1 F (36.7 C)   Ht 5' 5"$  (1.651 m)   Wt 258 lb 9.6 oz (117.3 kg)   SpO2 100%   BMI 43.03 kg/m   SpO2: 100 %  Amb morbidly obese (by BMI)  bf nad    HEENT : Oropharynx  clear          NECK :  without  apparent JVD/ palpable Nodes/TM    LUNGS: no acc muscle use,  Nl contour chest which is clear to A and P bilaterally without cough on insp or exp maneuvers   CV: RRR  no s3 or murmur or increase in P2, and trace pitting both LE    ABD:  quite obese but soft and nontender with nl inspiratory excursion in the supine position. No bruits or organomegaly appreciated   MS:  Nl gait/ ext warm without deformities Or obvious joint restrictions  calf tenderness, cyanosis or clubbing    SKIN: warm and dry without lesions    NEURO:  alert, approp, nl sensorium with  no motor or cerebellar deficits apparent.     I personally reviewed images and agree with radiology impression as follows:  CXR:   portable  06/05/22   1. Progressive streaky infiltrate within the right perihilar region and lung base in keeping with developing pneumonic infiltrate in the appropriate clinical setting  Rec cxr 07/04/2022 > did not go for cxr/ attempted to contact all numbers, no response  Assessment   Cough variant asthma  vs UACS Onset 2023  - d/c advair 07/04/2022 - 07/04/2022  After extensive coaching inhaler device,  effectiveness =    50%  from a baseline of 0 so try symbicort 80 2bid / prn air supra and f/u in 4 weeks   DDX of  difficult airways management almost all start with A and  include Adherence, Ace Inhibitors, Acid Reflux, Active Sinus Disease, Alpha 1 Antitripsin deficiency, Anxiety masquerading as Airways dz,  ABPA,  Allergy(esp in young), Aspiration (esp in elderly), Adverse effects of  meds,  Active smoking or vaping, A bunch of PE's (a small clot burden can't cause this syndrome unless there is already severe underlying pulm or vascular dz with poor reserve) plus two Bs  = Bronchiectasis and Beta blocker use..and one C= CHF   Adherence is always the initial "prime suspect" and is a multilayered concern that requires a "trust but verify" approach in every patient - starting with knowing how to use medications, especially inhalers, correctly, keeping up with refills and understanding the fundamental difference between maintenance and prns vs those medications only taken for a very short course and then stopped and not refilled.  - see hfa teaching - return with all meds in hand using a trust but verify approach to confirm accurate Medication  Reconciliation The principal here is that until we are certain that the  patients are doing what we've asked, it makes no sense to ask them to do more.   ? Acid (or non-acid) GERD > always difficult to exclude as up to 75% of pts in some series report no assoc GI/ Heartburn symptoms> rec max (24h)  acid suppression and diet restrictions/ reviewed and instructions given in writing.   ? Allergy > symbicort 80/singulair for now  ? Adverse drug effects:  entresto and coreg  may be contributing to cough    ? Beta blockers: In the setting of respiratory symptoms of unknown etiology,  It would be preferable to use bystolic, the most beta -1  selective Beta blocker available in sample form, with bisoprolol the most selective generic choice  on the market, at least on a trial basis, to make  sure the spillover Beta 2 effects of the less specific Beta blockers are not contributing to this patient's symptoms.   ? CHF > G 2  diastolic dysfunction with ? Component cardiac asthma >rx per cards with aggressive bp control     ????  Asthmatic bronchitis , chronic (note really sure this is tenable dx)   When respiratory symptoms begin or become refractory well after a patient reports complete smoking cessation,  Especially when this wasn't the case while they were smoking, a red flag is raised based on the work of Dr Kris Mouton which states:  if you quit smoking when your best day FEV1 is still well preserved it is highly unlikely you will progress to severe disease.  That is to say, once the smoking stops,  the symptoms should not suddenly erupt or markedly worsen.  If so, the differential diagnosis should include  obesity/deconditioning,  LPR/Reflux/Aspiration syndromes,  occult CHF, or  especially side effect of medications commonly used in this population, especially ACEi and now entresto  The sacubitrilat component of entresto is not and ACEi but it does lead to higher levels of bradykinin (the culprit in ACEi related cough) because it reduces Neprilysin based clearance of bradykinin. The typical symptoms are dry daytime cough (9% per PI) or complaints of a new sensation of globus or excess PNDS.   >>>  If not improving next step would be to double the dose of valsartan that's in entresto and try off of it for a month to see what if any benefits this has for cough control.        Obesity, Class III, BMI 40-49.9 (morbid obesity) (HCC) Body mass index is 43.03 kg/m.  -  trending down/ reinforced  No results found for: "TSH"    Contributing to doe and risk of GERD >>>   reviewed the need and  the process to achieve and maintain neg calorie balance > defer f/u primary care including intermittently monitoring thyroid status        Each maintenance medication was reviewed in detail  including emphasizing most importantly the difference between maintenance and prns and under what circumstances the prns are to be triggered using an action plan format where appropriate.  Total time for H and P, chart review, counseling, reviewing hfa/dpi device(s) and generating customized AVS unique to this office visit / same day charting > 45 min new pt eval          Christinia Gully, MD 07/05/2022

## 2022-07-04 NOTE — Assessment & Plan Note (Signed)
Body mass index is 43.03 kg/m.  -  trending down/ reinforced  No results found for: "TSH"    Contributing to doe and risk of GERD >>>   reviewed the need and the process to achieve and maintain neg calorie balance > defer f/u primary care including intermittently monitoring thyroid status             Each maintenance medication was reviewed in detail including emphasizing most importantly the difference between maintenance and prns and under what circumstances the prns are to be triggered using an action plan format where appropriate.  Total time for H and P, chart review, counseling, reviewing hfa/dpi device(s) and generating customized AVS unique to this office visit / same day charting > 45 min new pt eval

## 2022-07-04 NOTE — Telephone Encounter (Signed)
I have tried to contact the patient multiple times. The last attempt someone other then the patient answered the phone. I asked for the patient and she said she was not avaliable. I asked her to tell the patient to contact our office as soon as possible.

## 2022-07-04 NOTE — Assessment & Plan Note (Addendum)
Onset 2023  - d/c advair 07/04/2022 - 07/04/2022  After extensive coaching inhaler device,  effectiveness =    50% from a baseline of 0 so try symbicort 80 2bid / prn air supra and f/u in 4 weeks   DDX of  difficult airways management almost all start with A and  include Adherence, Ace Inhibitors, Acid Reflux, Active Sinus Disease, Alpha 1 Antitripsin deficiency, Anxiety masquerading as Airways dz,  ABPA,  Allergy(esp in young), Aspiration (esp in elderly), Adverse effects of meds,  Active smoking or vaping, A bunch of PE's (a small clot burden can't cause this syndrome unless there is already severe underlying pulm or vascular dz with poor reserve) plus two Bs  = Bronchiectasis and Beta blocker use..and one C= CHF   Adherence is always the initial "prime suspect" and is a multilayered concern that requires a "trust but verify" approach in every patient - starting with knowing how to use medications, especially inhalers, correctly, keeping up with refills and understanding the fundamental difference between maintenance and prns vs those medications only taken for a very short course and then stopped and not refilled.  - see hfa teaching - return with all meds in hand using a trust but verify approach to confirm accurate Medication  Reconciliation The principal here is that until we are certain that the  patients are doing what we've asked, it makes no sense to ask them to do more.   ? Acid (or non-acid) GERD > always difficult to exclude as up to 75% of pts in some series report no assoc GI/ Heartburn symptoms> rec max (24h)  acid suppression and diet restrictions/ reviewed and instructions given in writing.   ? Allergy > symbicort 80/singulair for now  ? Adverse drug effects:  entresto and coreg  may be contributing to cough    ? Beta blockers: In the setting of respiratory symptoms of unknown etiology,  It would be preferable to use bystolic, the most beta -1  selective Beta blocker available in  sample form, with bisoprolol the most selective generic choice  on the market, at least on a trial basis, to make sure the spillover Beta 2 effects of the less specific Beta blockers are not contributing to this patient's symptoms.   ? CHF > G 2  diastolic dysfunction with ? Component cardiac asthma >rx per cards with aggressive bp control

## 2022-07-04 NOTE — Patient Instructions (Addendum)
Your blood pressure medications may be making you cough and I will make some suggestions for appropriate substitutes   Stop advair and any other inhalers   Plan A = Automatic = Always=    Symbicort 80  Take 2 puffs first thing in am and then another 2 puffs about 12 hours later.    Work on inhaler technique:  relax and gently blow all the way out then take a nice smooth full deep breath back in, triggering the inhaler at same time you start breathing in.  Hold breath in for at least  5 seconds if you can. Blow out symbicort  thru nose. Rinse and gargle with water when done.  If mouth or throat bother you at all,  try brushing teeth/gums/tongue with arm and hammer toothpaste/ make a slurry and gargle and spit out.      Plan B = Backup (to supplement plan A, not to replace it) Only use your Air Supra inhaler(or albuterol)  as a rescue medication to be used if you can't catch your breath by resting or doing a relaxed purse lip breathing pattern.  - The less you use it, the better it will work when you need it. - Ok to use the inhaler up to 2 puffs  every 4 hours if needed   Plan C = Crisis (instead of Plan B but only if Plan B stops working) - only use your albuterol- nebulizer if you first try Plan B and it fails to help > ok to use the nebulizer up to every 4 hours but if start needing it regularly call for immediate appointment  Pantoprazole (protonix) 40 mg   Take  30-60 min before first meal of the day and Pepcid (famotidine)  20 mg after supper until return to office - this is the best way to tell whether stomach acid is contributing to your problem.    GERD (REFLUX)  is an extremely common cause of respiratory symptoms just like yours , many times with no obvious heartburn at all.    It can be treated with medication, but also with lifestyle changes including elevation of the head of your bed (ideally with 6 -8inch blocks under the headboard of your bed),  Smoking cessation, avoidance of  late meals, excessive alcohol, and avoid fatty foods, chocolate, peppermint, colas, red wine, and acidic juices such as orange juice.  NO MINT OR MENTHOL PRODUCTS SO NO COUGH DROPS  USE SUGARLESS CANDY INSTEAD (Jolley ranchers or Stover's or Life Savers) or even ice chips will also do - the key is to swallow to prevent all throat clearing. NO OIL BASED VITAMINS - use powdered substitutes.  Avoid fish oil when coughing.   Please remember to go to the  x-ray department  for your tests - we will call you with the results when they are available.  Please schedule a follow up office visit in 4 weeks, sooner if needed

## 2022-07-04 NOTE — Telephone Encounter (Signed)
The patient was seen in the office today by Dr. [name]. He ordered a chest xray to be done after the visit. The patient did not have the xray done. I have tried to contact the patient but I did not get an answer and her voicemail was not set up. I will try again later.

## 2022-07-05 NOTE — Assessment & Plan Note (Addendum)
  When respiratory symptoms begin or become refractory well after a patient reports complete smoking cessation,  Especially when this wasn't the case while they were smoking, a red flag is raised based on the work of Dr Kris Mouton which states:  if you quit smoking when your best day FEV1 is still well preserved it is highly unlikely you will progress to severe disease.  That is to say, once the smoking stops,  the symptoms should not suddenly erupt or markedly worsen.  If so, the differential diagnosis should include  obesity/deconditioning,  LPR/Reflux/Aspiration syndromes,  occult CHF, or  especially side effect of medications commonly used in this population, especially ACEi and now entresto  The sacubitrilat component of entresto is not and ACEi but it does lead to higher levels of bradykinin (the culprit in ACEi related cough) because it reduces Neprilysin based clearance of bradykinin. The typical symptoms are dry daytime cough (9% per PI) or complaints of a new sensation of globus or excess PNDS.   If not improving next step would be to double the dose of valsartan that's in entresto and try off of it for a month to see what if any benefits this has for cough control.

## 2022-07-07 IMAGING — DX DG KNEE COMPLETE 4+V*L*
4 series · 4 of 4 positions shown · non-contrast
Comparison: None.

CLINICAL DATA: Left knee pain and swelling after fall

EXAM:
LEFT KNEE - COMPLETE 4+ VIEW

[knee ap]
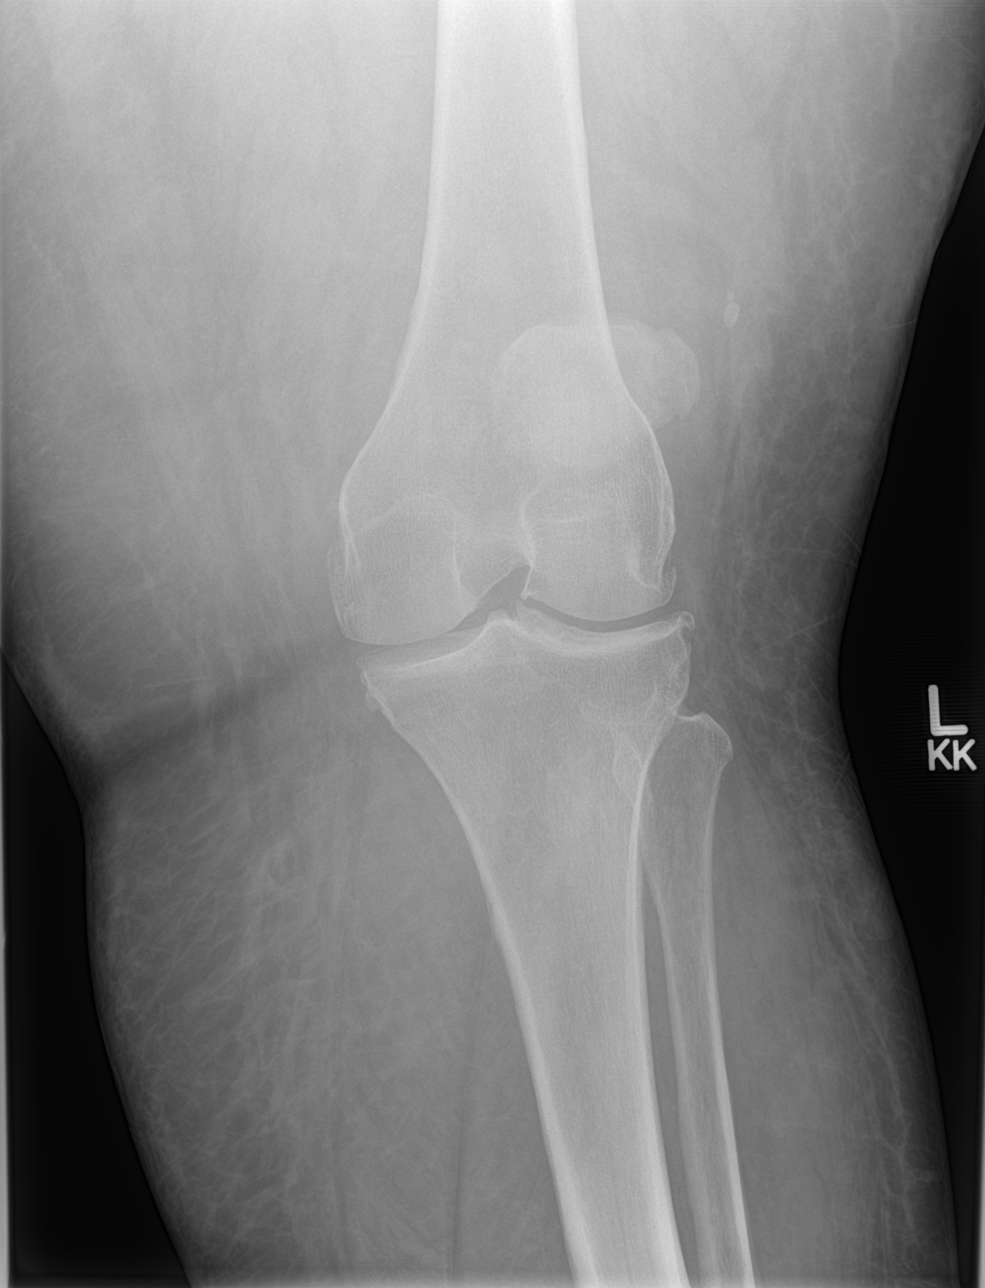

[knee lat]
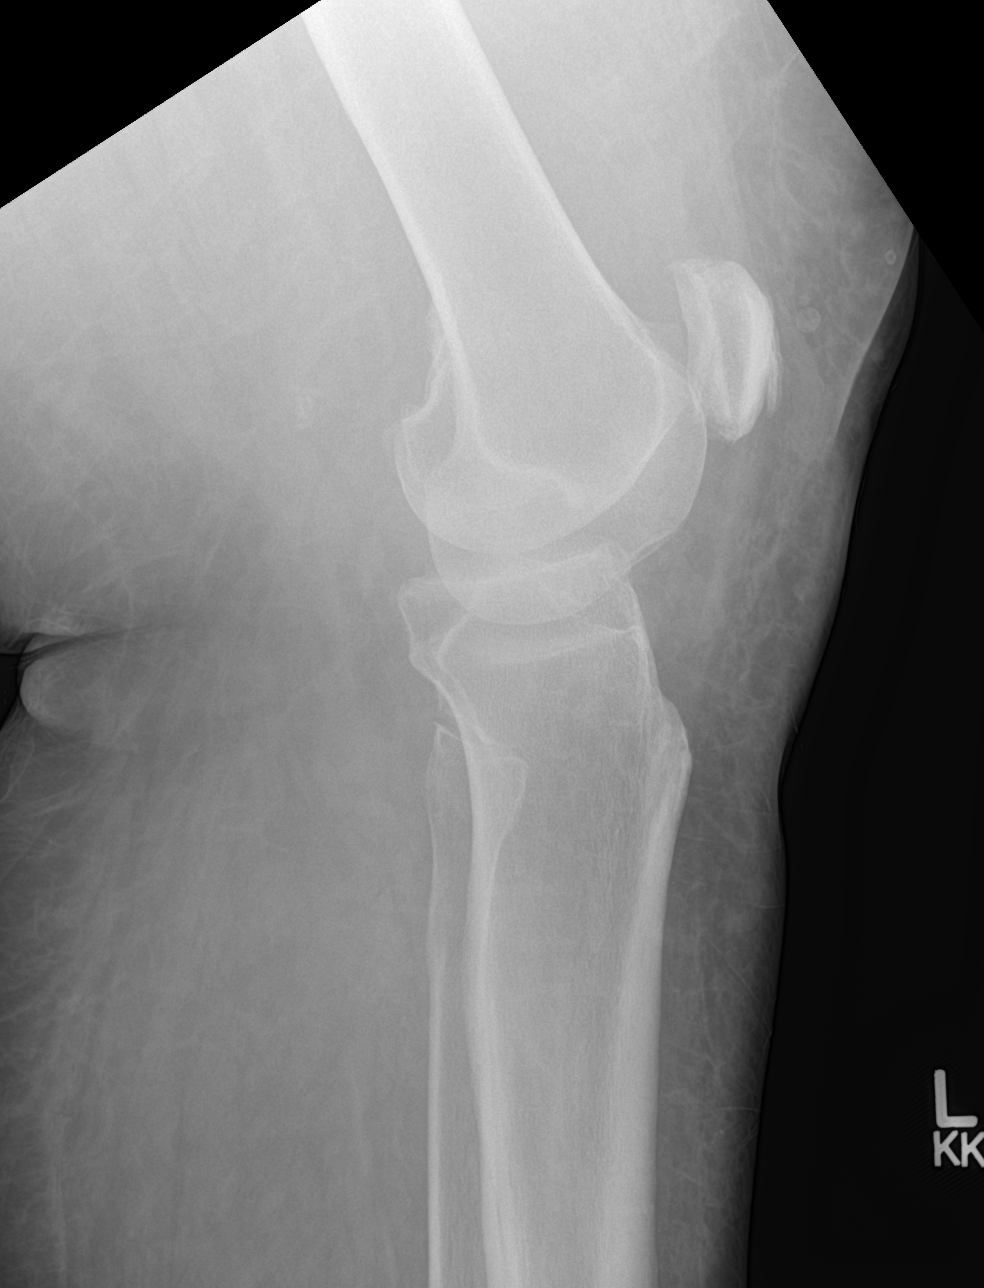

[knee obl (1 of 2)]
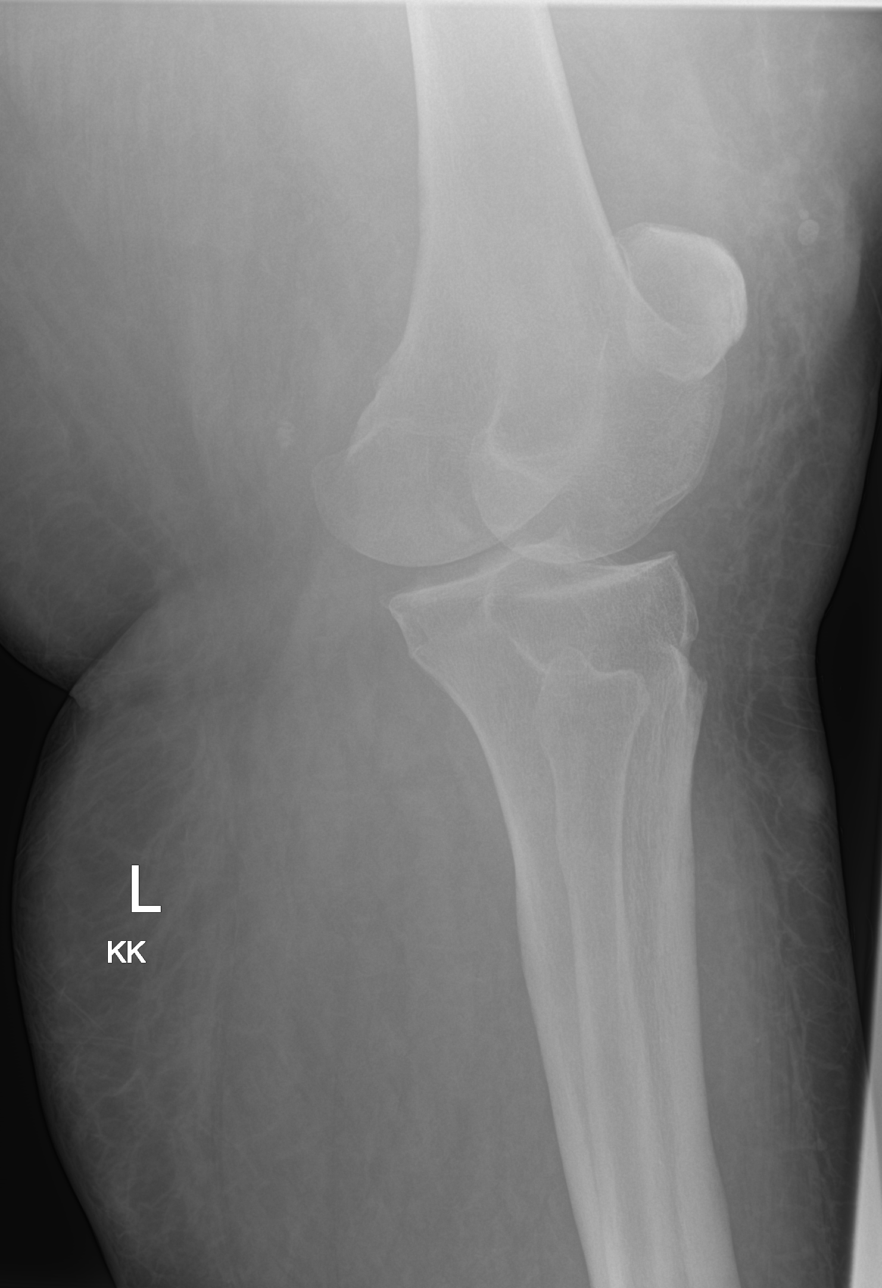

[knee obl (2 of 2)]
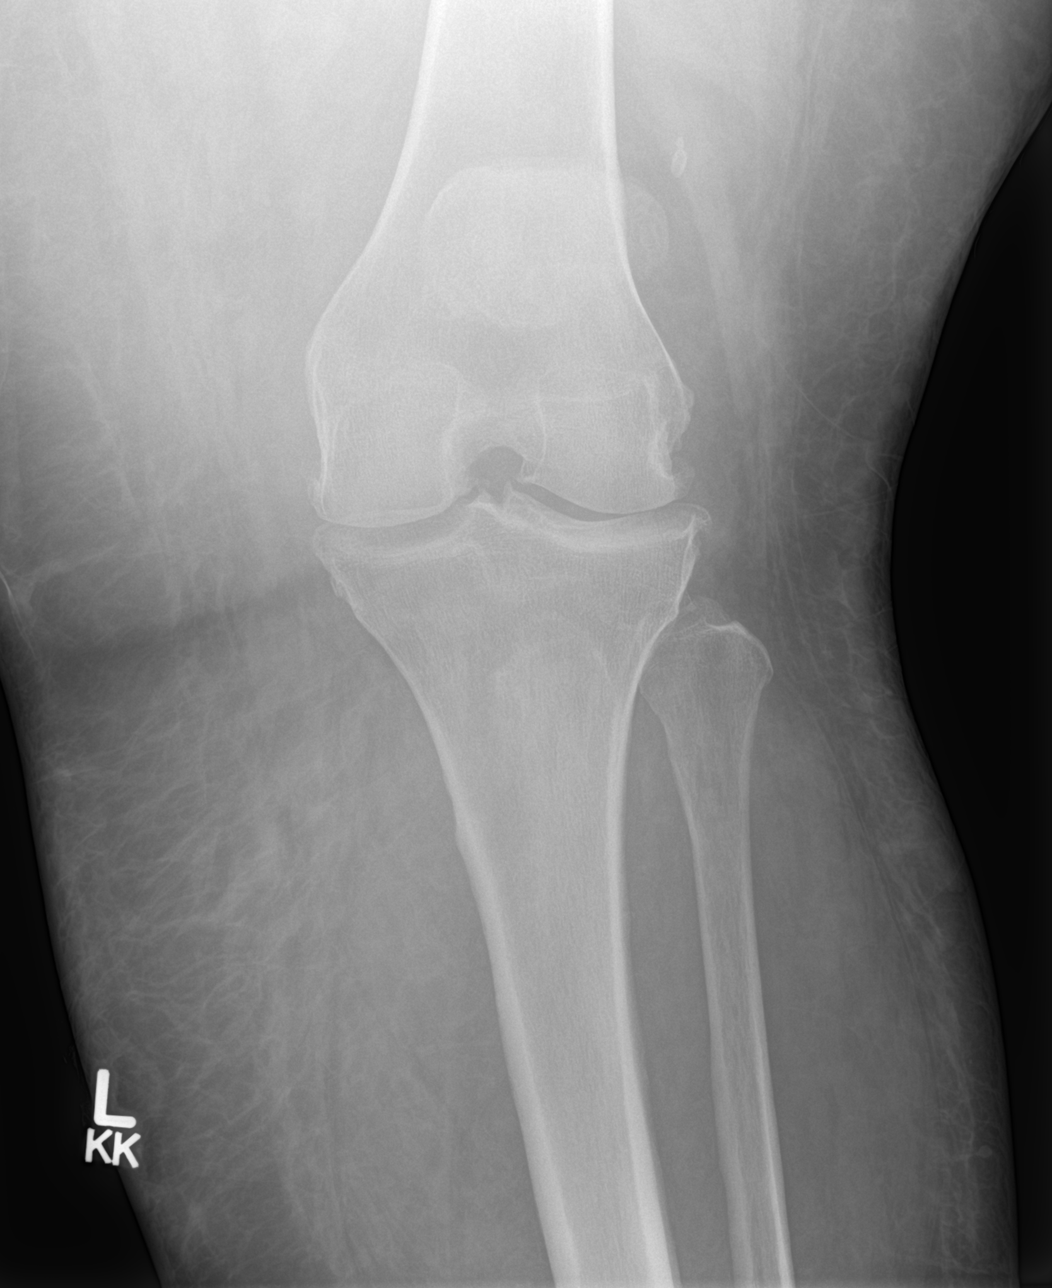

[4 of 4 positions shown; findings below may reference images not displayed]

FINDINGS: Frontal, bilateral oblique, lateral views of the left knee are
obtained. No fracture, subluxation, or dislocation. There is
moderate 3 compartmental osteoarthritis. No joint effusion. Diffuse
subcutaneous edema.
IMPRESSION: 1. No acute displaced fracture.
2. Moderate 3 compartmental osteoarthritis.
3. Diffuse subcutaneous edema.

## 2022-07-07 NOTE — Telephone Encounter (Signed)
ATC patient--no answer with no option to leave vm. Mailbox has not been setup.  Will try once more due to nature of call.

## 2022-07-07 NOTE — Telephone Encounter (Signed)
I tried to contact the patient again. I did not get an answer and I can not leave a message. I have printed a note and mailed it to the patient asking her to have the xray done ASAP.  Nothing further needed.

## 2022-07-14 ENCOUNTER — Other Ambulatory Visit: Payer: Self-pay | Admitting: Family Medicine

## 2022-07-14 ENCOUNTER — Ambulatory Visit
Admission: RE | Admit: 2022-07-14 | Discharge: 2022-07-14 | Disposition: A | Discharge status: Home / Self Care | Payer: 59 | Source: Ambulatory Visit | Attending: Family Medicine | Admitting: Family Medicine

## 2022-07-14 DIAGNOSIS — M25562 Pain in left knee: Secondary | ICD-10-CM

## 2022-07-18 ENCOUNTER — Other Ambulatory Visit
Admission: RE | Admit: 2022-07-18 | Discharge: 2022-07-18 | Disposition: A | Discharge status: Home / Self Care | Payer: 59 | Source: Ambulatory Visit | Attending: Family | Admitting: Family

## 2022-07-18 ENCOUNTER — Encounter: Payer: Self-pay | Admitting: Pharmacist

## 2022-07-18 ENCOUNTER — Encounter: Payer: Self-pay | Admitting: Family

## 2022-07-18 ENCOUNTER — Ambulatory Visit (HOSPITAL_BASED_OUTPATIENT_CLINIC_OR_DEPARTMENT_OTHER): Payer: 59 | Admitting: Family

## 2022-07-18 VITALS — BP 168/110 | HR 74 | Resp 20 | Wt 254.0 lb

## 2022-07-18 DIAGNOSIS — G40209 Localization-related (focal) (partial) symptomatic epilepsy and epileptic syndromes with complex partial seizures, not intractable, without status epilepticus: Secondary | ICD-10-CM

## 2022-07-18 DIAGNOSIS — I5032 Chronic diastolic (congestive) heart failure: Secondary | ICD-10-CM | POA: Diagnosis present

## 2022-07-18 DIAGNOSIS — I10 Essential (primary) hypertension: Secondary | ICD-10-CM | POA: Diagnosis not present

## 2022-07-18 LAB — BASIC METABOLIC PANEL
Anion gap: 10 (ref 5–15)
BUN: 25 mg/dL — ABNORMAL HIGH (ref 6–20)
CO2: 29 mmol/L (ref 22–32)
Calcium: 9.1 mg/dL (ref 8.9–10.3)
Chloride: 102 mmol/L (ref 98–111)
Creatinine, Ser: 1.49 mg/dL — ABNORMAL HIGH (ref 0.44–1.00)
GFR, Estimated: 42 mL/min — ABNORMAL LOW (ref >60)
Glucose, Bld: 83 mg/dL (ref 70–99)
Potassium: 3.7 mmol/L (ref 3.5–5.1)
Sodium: 141 mmol/L (ref 135–145)

## 2022-07-18 MED ORDER — CARVEDILOL 25 MG PO TABS: 25.0000 mg | ORAL_TABLET | Freq: Two times a day (BID) | ORAL | 3 refills | Status: DC

## 2022-07-18 NOTE — Patient Instructions (Signed)
Finish current carvedilol bottle by taking 2 tablets in the morning and 2 tablets in the evening. When you pick up your new bottle, you will resume taking 1 tablet twice daily because it will be the higher dose.

## 2022-07-18 NOTE — Progress Notes (Signed)
Patient ID: Sheila Hays, female    DOB: 1971/04/11, 52 y.o.   MRN: [id number]  HPI  Sheila Hays is a 52 y/o female with a history of HTN, CKD, GERD, seizures and chronic heart failure.   Echo 06/03/22 showed an EF of 50-55% along with moderate LVH and mild LAE/RAE. Echo report from 11/21/21 reviewed and showed an EF of 55-60% along with mild LVH and mild/moderate LAE. Echo report from 11/30/20 reviewed and showed an EF of 55-60% without LVH.   RHC done 11/25/21 and showed: Hemodynamic findings consistent with severe pulmonary hypertension.  Successful right heart catheterization via the right antecubital vein. This showed evidence of severely elevated right and left-sided filling pressures, severe pulmonary hypertension and normal cardiac output.  RA: 22 mmHg RV: 92/18 with an end-diastolic pressure of 32 mmHg. PA: 95/44 with a mean of 64 mmHg PCW: 30 mmHg  Cardiac output: 7.4 with an index of 3.16. Pulmonary vascular resistance: 4.5 [place] units  Admitted 06/02/22 due to SOB/ cough due to bronchitis due to influenza.   She presents today for a follow-up visit with a chief complaint of minimal SOB with moderate exertion. Describes this as chronic in nature. Has associated cough, dizziness & difficulty sleeping along with this. Denies any abdominal distention, palpitations, pedal edema, chest pain, fatigue or weight gain.   Past Medical History:  Diagnosis Date   CHF (congestive heart failure) (HCC)    Chronic kidney disease (CKD), stage III (moderate) (HCC)    GERD (gastroesophageal reflux disease)    Hypertension    Seizures ([place])    Followed at [facility]   Past Surgical History:  Procedure Laterality Date   RIGHT HEART CATH N/A 11/25/2021   Procedure: RIGHT HEART CATH;  Surgeon: Wellington Hampshire, MD;  Location: [facility];  Service: Cardiovascular;  Laterality: N/A;   Family History  Problem Relation Age of Onset   Hypertension Mother    Hypertension Father    Heart failure  Sister    Diabetes Sister    Seizures Sister    Breast cancer Neg Hx    Social History   Tobacco Use   Smoking status: Former    Packs/day: 0.50    Years: 7.00    Total pack years: 3.50    Types: Cigarettes    Quit date: 2018    Years since quitting: 6.1   Smokeless tobacco: Former    Quit date: 03/07/2007  Substance Use Topics   Alcohol use: Not Currently   Allergies  Allergen Reactions   Ciprofloxacin Itching   Prior to Admission medications   Medication Sig Start Date End Date Taking? Authorizing Provider  acetaminophen (TYLENOL) 500 MG tablet Take 500 mg by mouth every 8 (eight) hours as needed for mild pain.   Yes [provider]  ADVAIR DISKUS 250-50 MCG/ACT AEPB Inhale 1 puff into the lungs 2 (two) times daily. 11/18/21  Yes [provider]  albuterol (VENTOLIN HFA) 108 (90 Base) MCG/ACT inhaler Inhale 2 puffs into the lungs every 4 (four) hours as needed. 10/16/20  Yes [provider]  aspirin EC 81 MG tablet Take 81 mg by mouth daily.   Yes [provider]  carvedilol (COREG) 12.5 MG tablet Take 1 tablet (12.5 mg total) by mouth 2 (two) times daily with a meal. 06/07/22  Yes Hongalgi, Lenis Dickinson, MD  folic acid (FOLVITE) 1 MG tablet Take 0.5 tablets (0.5 mg total) by mouth daily. 12/07/21  Yes Emeterio Reeve, DO  hydrALAZINE (APRESOLINE) 25 MG tablet Take 1 tablet (25 mg total) by mouth 3 (three) times daily. 06/07/22  Yes Hongalgi, Lenis Dickinson, MD  ipratropium-albuterol (DUONEB) 0.5-2.5 (3) MG/3ML SOLN Take 3 mLs by nebulization every 6 (six) hours as needed. 11/20/21  Yes [provider]  montelukast (SINGULAIR) 10 MG tablet Take 10 mg by mouth daily. 11/06/21  Yes [provider]  omeprazole (PRILOSEC) 20 MG capsule Take 20 mg by mouth daily.   Yes [provider]  oxcarbazepine (TRILEPTAL) 600 MG tablet Take 600 mg by mouth 2 (two) times daily.   Yes [provider]  rosuvastatin (CRESTOR) 20 MG tablet  Take 20 mg by mouth at bedtime. 12/10/21  Yes [provider]  sacubitril-valsartan (ENTRESTO) 24-26 MG Take 1 tablet by mouth 2 (two) times daily. 06/13/22  Yes Darylene Price A, FNP  torsemide (DEMADEX) 20 MG tablet Take 1 tablet (20 mg total) by mouth daily. 06/07/22  Yes Hongalgi, Lenis Dickinson, MD  traZODone (DESYREL) 100 MG tablet Take 100 mg by mouth at bedtime as needed for sleep.   Yes [provider]  vitamin C (ASCORBIC ACID) 500 MG tablet Take 500 mg by mouth daily.   Yes [provider]  calcium carbonate (TUMS - DOSED IN MG ELEMENTAL CALCIUM) 500 MG chewable tablet Chew 1 tablet (200 mg of elemental calcium total) by mouth 3 (three) times daily as needed for indigestion or heartburn. Patient not taking: Reported on 06/13/2022 12/06/21   Emeterio Reeve, DO    Review of Systems  Constitutional:  Negative for appetite change and fatigue.  HENT:  Negative for congestion, postnasal drip and sore throat.   Eyes: Negative.   Respiratory:  Positive for cough and shortness of breath. Negative for chest tightness.        + snoring  Cardiovascular:  Negative for chest pain, palpitations and leg swelling.  Gastrointestinal:  Negative for abdominal distention and abdominal pain.  Endocrine: Negative.   Genitourinary: Negative.   Musculoskeletal:  Positive for arthralgias (left knee). Negative for back pain and neck pain.  Skin: Negative.   Allergic/Immunologic: Negative.   Neurological:  Positive for dizziness (at times). Negative for light-headedness.  Hematological:  Negative for adenopathy. Does not bruise/bleed easily.  Psychiatric/Behavioral:  Positive for sleep disturbance (sleeping on 3 pillows). Negative for dysphoric mood. The patient is not nervous/anxious.    Vitals:   07/18/22 1253  BP: (!) 184/89  Pulse: 74  Resp: 20  SpO2: 97%  Weight: 254 lb (115.2 kg)   Wt Readings from Last 3 Encounters:  07/18/22 254 lb (115.2 kg)  07/04/22 258 lb 9.6 oz  (117.3 kg)  06/13/22 258 lb 2 oz (117.1 kg)   Lab Results  Component Value Date   CREATININE 1.73 (H) 06/07/2022   CREATININE 1.51 (H) 06/06/2022   CREATININE 1.63 (H) 06/05/2022   Physical Exam Vitals and nursing note reviewed.  Constitutional:      Appearance: Normal appearance.  HENT:     Head: Normocephalic and atraumatic.  Cardiovascular:     Rate and Rhythm: Normal rate and regular rhythm.  Pulmonary:     Effort: Pulmonary effort is normal. No respiratory distress.     Breath sounds: Normal breath sounds. No wheezing or rales.  Abdominal:     General: There is no distension.     Palpations: Abdomen is soft.  Musculoskeletal:        General: No tenderness.     Cervical back: Normal range of motion  and neck supple.     Right lower leg: No edema.     Left lower leg: No edema.  Skin:    General: Skin is warm and dry.  Neurological:     General: No focal deficit present.     Mental Status: She is alert and oriented to person, place, and time.  Psychiatric:        Mood and Affect: Mood normal.        Behavior: Behavior normal.        Thought Content: Thought content normal.    Assessment & Plan:  1: Chronic heart failure with preserved ejection fraction with LVH- - NYHA class II - euvolemic today - weighing daily; reminded to call for an overnight weight gain of > 2 pounds or a weekly weight gain of > 5 pounds - weight down 4 pounds from last visit here 5 months ago - not adding salt; reviewed the importance of reading food labels to keep her daily sodium intake to '2000mg'$  / day - trying to increase her activity - saw cardiology Kathlen Mody) 05/30/22 - carvedilol 12.'5mg'$  BID; increase this to '25mg'$  BID - hydralazine '50mg'$  BID - entresto 24/ '26mg'$  BID - torsemide '20mg'$  daily - BMP today - consider adding spiro at next visit if renal function is stable - BNP 06/05/22 was 149.6 - PharmD reconciled meds w/ patient  2: HTN- - BP 168/85; increasing carvedilol to '25mg'$  BID -  seeing PCP at [facility] 06/07/22 reviewed and showed sodium 141, potassium 3.4, creatinine 1.73 and GFR 35 - saw nephrology Karl Pock) 01/21/21  3: Seizures- - saw neurology Cornelia Copa) 12/13/20 - on trileptal   Medication list reviewed.   Return in 3-4 weeks, sooner if needed.

## 2022-07-22 NOTE — Progress Notes (Signed)
Patient ID: Sheila Hays, female   DOB: Nov 29, 1970, 52 y.o.   MRN: [id number]  [facility] COUNSELING NOTE  Guideline-Directed Medical Therapy/Evidence Based Medicine  ACE/ARB/ARNI: Sacubitril-valsartan 24-26 mg twice daily Beta Blocker: Carvedilol 12.5 mg twice daily Aldosterone Antagonist:  none Diuretic: Torsemide 20 mg daily SGLT2i:  none Hydralazine '25mg'$  TID  Adherence Assessment  Do you ever forget to take your medication? '[]'$ Yes '[x]'$ No  Do you ever skip doses due to side effects? '[]'$ Yes '[x]'$ No  Do you have trouble affording your medicines? '[]'$ Yes '[x]'$ No  Are you ever unable to pick up your medication due to transportation difficulties? '[]'$ Yes '[x]'$ No  Do you ever stop taking your medications because you don't believe they are helping? '[]'$ Yes '[x]'$ No  Do you check your weight daily? '[x]'$ Yes '[]'$ No   Adherence strategy: none  Barriers to obtaining medications: none  Vital signs: HR 74, BP 168/110, weight (pounds) 254 lbs  ECHO: Date 06/03/2022, EF 50-55%, moderate LVH and mild LAE/RAE      Latest Ref Rng & Units 07/18/2022    2:08 PM 06/07/2022    5:58 AM 06/06/2022    4:13 AM  BMP  Glucose 70 - 99 mg/dL 83  88  90   BUN 6 - 20 mg/dL 25  34  31   Creatinine 0.44 - 1.00 mg/dL 1.49  1.73  1.51   Sodium 135 - 145 mmol/L 141  141  136   Potassium 3.5 - 5.1 mmol/L 3.7  3.4  3.3   Chloride 98 - 111 mmol/L 102  103  100   CO2 22 - 32 mmol/L '29  28  27   '$ Calcium 8.9 - 10.3 mg/dL 9.1  8.9  8.6     Past Medical History:  Diagnosis Date   CHF (congestive heart failure) (HCC)    Chronic kidney disease (CKD), stage III (moderate) (HCC)    GERD (gastroesophageal reflux disease)    Hypertension    Seizures (HCC)    Followed at [facility]    ASSESSMENT 52 year old female who presents to the HF clinic for follow up. PMH includes HTN, CKD, GERD, seizures and chronic heart failure. Most recent admission to acute care was 05/2022 due to  SOB and cough.   Medication reconciliation was completed during OV. Noted weight loss of 4lbs since last OV. BP remains significantly above goal while on Entresto, carvedilol, and hydralazine. Due to previously low K and AKI, will recommend increasing carvedilol dose today, then consider change in ARNI and MRA once renal functon stable.   PLAN  Increase carvedilol to '25mg'$  po BID Repeat BMET today Consider adding spironolactone once Scr stable   Time spent: 15 minutes  Makeshia Seat Rodriguez-Guzman PharmD, BCPS 07/22/2022 9:34 AM    Current Outpatient Medications:    acetaminophen (TYLENOL) 500 MG tablet, Take 500 mg by mouth every 8 (eight) hours as needed for mild pain., Disp: , Rfl:    Albuterol-Budesonide (AIRSUPRA) 90-80 MCG/ACT AERO, Inhale 2 puffs into the lungs every 4 (four) hours as needed., Disp: 10.7 g, Rfl: 0   aspirin EC 81 MG tablet, Take 81 mg by mouth daily., Disp: , Rfl:    budesonide-formoterol (SYMBICORT) 80-4.5 MCG/ACT inhaler, Take 2 puffs first thing in am and then another 2 puffs about 12 hours later., Disp: 1 each, Rfl: 12   carvedilol (COREG) 25 MG tablet, Take 1 tablet (25 mg total) by mouth 2 (two) times daily., Disp: 60 tablet, Rfl: 3  famotidine (PEPCID) 20 MG tablet, One after supper, Disp: 30 tablet, Rfl: 11   folic acid (FOLVITE) 1 MG tablet, Take 0.5 tablets (0.5 mg total) by mouth daily., Disp: 30 tablet, Rfl: 0   hydrALAZINE (APRESOLINE) 25 MG tablet, Take 1 tablet (25 mg total) by mouth 3 (three) times daily. (Patient taking differently: Take 50 mg by mouth 2 (two) times daily.), Disp: 90 tablet, Rfl: 1   ipratropium-albuterol (DUONEB) 0.5-2.5 (3) MG/3ML SOLN, Take 3 mLs by nebulization every 6 (six) hours as needed., Disp: , Rfl:    LINZESS 145 MCG CAPS capsule, Take 145 mcg by mouth daily., Disp: , Rfl:    montelukast (SINGULAIR) 10 MG tablet, Take 10 mg by mouth daily., Disp: , Rfl:    oxcarbazepine (TRILEPTAL) 600 MG tablet, Take 600 mg by mouth 2  (two) times daily., Disp: , Rfl:    rosuvastatin (CRESTOR) 20 MG tablet, Take 20 mg by mouth at bedtime., Disp: , Rfl:    sacubitril-valsartan (ENTRESTO) 24-26 MG, Take 1 tablet by mouth 2 (two) times daily., Disp: 60 tablet, Rfl: 3   torsemide (DEMADEX) 20 MG tablet, Take 1 tablet (20 mg total) by mouth daily., Disp: 30 tablet, Rfl: 1   traZODone (DESYREL) 100 MG tablet, Take 100 mg by mouth at bedtime as needed for sleep., Disp: , Rfl:    vitamin C (ASCORBIC ACID) 500 MG tablet, Take 500 mg by mouth daily., Disp: , Rfl:     MEDICATION ADHERENCES TIPS AND STRATEGIES Taking medication as prescribed improves patient outcomes in heart failure (reduces hospitalizations, improves symptoms, increases survival) Side effects of medications can be managed by decreasing doses, switching agents, stopping drugs, or adding additional therapy. Please let someone in the [facility] know if you have having bothersome side effects so we can modify your regimen. Do not alter your medication regimen without talking to [place].  Medication reminders can help patients remember to take drugs on time. If you are missing or forgetting doses you can try linking behaviors, using pill boxes, or an electronic reminder like an alarm on your phone or an app. Some people can also get automated phone calls as medication reminders.

## 2022-08-01 ENCOUNTER — Encounter: Payer: Self-pay | Admitting: Cardiology

## 2022-08-01 ENCOUNTER — Ambulatory Visit: Payer: 59 | Attending: Cardiology | Admitting: Cardiology

## 2022-08-08 ENCOUNTER — Encounter: Payer: 59 | Admitting: Family

## 2022-08-14 NOTE — Progress Notes (Deleted)
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 No SOB or wheezing. Cough with yellow sputum. Has been told by her PCP that she has COPD.   Dyspnea:  walks every evening around complex x 10-15 min flat/slow tol ok  Cough: esp p supper  mucus is slt yellow not so much noct once asleep nor any am flare on awakening  Sleep: props way up x year  SABA use: none  Rec Your blood pressure medications may be making you cough and I will make some suggestions for appropriate substitutes  Stop advair and any other inhalers  Plan A = Automatic = Always=    Symbicort 80  Take 2 puffs first thing in am and then another 2 puffs about 12 hours later.  Work on inhaler technique:    Plan B = Backup (to supplement plan A, not to replace it) Only use [employer] account planner inhaler(or albuterol)  as a rescue medication  Plan C = Crisis (instead of Plan B but only if Plan B stops working) - only use your albuterol- nebulizer if you first try Plan B Pantoprazole (protonix) 40 mg   Take  30-60 min before first meal of the day and Pepcid (famotidine)  20 mg after supper until return to office GERD diet reviewed, bed blocks rec  Please remember to go to the  x-ray department  > did not go  Please schedule a follow up office visit in 4 weeks, sooner if needed         08/15/2022  f/u ov/Sheila Hays/ [name] re: ***   maint on *** needs cxr *** No chief complaint on file.   Dyspnea:  *** Cough: *** Sleeping: *** SABA use: *** 02:  *** Lung cancer sreening  ***   No obvious day to day or daytime variability or assoc excess/ purulent sputum or mucus plugs or hemoptysis or cp or chest tightness, subjective wheeze or overt sinus or hb symptoms.   *** without nocturnal  or early am exacerbation  of respiratory  c/o's or need for noct saba. Also denies any obvious fluctuation of symptoms with weather or environmental changes or other aggravating or alleviating factors except as outlined above   No unusual exposure hx or h/o childhood pna/ asthma or knowledge of premature birth.  Current Allergies, Complete Past Medical History, Past Surgical History, Family History, and Social History were reviewed in [employer] record.  ROS  The following are not active complaints unless bolded Hoarseness, sore throat, dysphagia, dental problems, itching, sneezing,  nasal congestion or discharge of excess mucus or purulent secretions, ear ache,   fever, chills, sweats, unintended wt loss or wt gain, classically pleuritic or exertional cp,  orthopnea pnd or arm/hand swelling  or leg swelling, presyncope, palpitations, abdominal pain, anorexia, nausea, vomiting, diarrhea  or change in bowel habits or change in bladder habits, change in stools  or change in urine, dysuria, hematuria,  rash, arthralgias, visual complaints, headache, numbness, weakness or ataxia or problems with walking or coordination,  change in mood or  memory.        No outpatient medications have been marked as taking for the 08/15/22 encounter (Appointment) with Tanda Rockers, MD.           Past Medical History:  Diagnosis Date   CHF (congestive heart failure) ([place])    Chronic kidney disease (CKD), stage III (moderate) (HCC)    GERD (gastroesophageal reflux disease)    Hypertension    Seizures ([place])    Followed at [facility]        Objective:      Wt Readings from Last 3 Encounters:  07/18/22 254 lb (115.2 kg)  07/04/22 258 lb 9.6 oz (117.3  kg)  06/13/22 258 lb 2 oz (117.1 kg)      Vital signs reviewed  08/15/2022  - Note at rest 02 sats  ***% on ***   General appearance:    ***      trace pitting both LE  ***       I personally reviewed images and agree with radiology impression as follows:  CXR:   portable  06/05/22   1. Progressive streaky infiltrate within the right perihilar region and lung base in keeping with developing pneumonic infiltrate in the appropriate clinical setting  Rec cxr 07/04/2022 > did not go for cxr/ attempted to contact all numbers, no response  Assessment

## 2022-08-15 ENCOUNTER — Ambulatory Visit: Payer: 59 | Admitting: Internal Medicine

## 2022-09-10 ENCOUNTER — Encounter: Payer: 59 | Admitting: Family

## 2022-09-10 ENCOUNTER — Telehealth: Payer: Self-pay | Admitting: Family

## 2022-09-10 NOTE — Telephone Encounter (Signed)
Patient did not show for her Heart Failure Clinic appointment on 09/10/22  

## 2022-09-22 ENCOUNTER — Other Ambulatory Visit (HOSPITAL_COMMUNITY): Payer: Self-pay | Admitting: Infectious Diseases

## 2022-09-22 DIAGNOSIS — I5030 Unspecified diastolic (congestive) heart failure: Secondary | ICD-10-CM

## 2022-09-23 ENCOUNTER — Ambulatory Visit
Admission: RE | Admit: 2022-09-23 | Discharge: 2022-09-23 | Disposition: A | Discharge status: Home / Self Care | Payer: 59 | Source: Ambulatory Visit | Attending: Infectious Diseases | Admitting: Infectious Diseases

## 2022-09-23 DIAGNOSIS — I5030 Unspecified diastolic (congestive) heart failure: Secondary | ICD-10-CM

## 2022-11-25 ENCOUNTER — Other Ambulatory Visit: Payer: Self-pay | Admitting: Internal Medicine

## 2022-11-25 DIAGNOSIS — R1013 Epigastric pain: Secondary | ICD-10-CM

## 2022-11-26 ENCOUNTER — Other Ambulatory Visit: Payer: Self-pay | Admitting: *Deleted

## 2022-12-03 ENCOUNTER — Ambulatory Visit
Admission: RE | Admit: 2022-12-03 | Discharge: 2022-12-03 | Disposition: A | Discharge status: Home / Self Care | Payer: 59 | Source: Ambulatory Visit | Attending: Internal Medicine | Admitting: Internal Medicine

## 2022-12-03 DIAGNOSIS — R1013 Epigastric pain: Secondary | ICD-10-CM

## 2022-12-23 ENCOUNTER — Encounter (HOSPITAL_BASED_OUTPATIENT_CLINIC_OR_DEPARTMENT_OTHER): Payer: Self-pay

## 2022-12-23 DIAGNOSIS — J449 Chronic obstructive pulmonary disease, unspecified: Secondary | ICD-10-CM

## 2023-01-21 ENCOUNTER — Ambulatory Visit (HOSPITAL_BASED_OUTPATIENT_CLINIC_OR_DEPARTMENT_OTHER): Payer: 59 | Attending: Internal Medicine | Admitting: Internal Medicine

## 2023-01-21 DIAGNOSIS — J449 Chronic obstructive pulmonary disease, unspecified: Secondary | ICD-10-CM

## 2023-02-11 ENCOUNTER — Encounter: Payer: 59 | Admitting: Family

## 2023-02-11 NOTE — Progress Notes (Deleted)
PCP: Primary Cardiologist:  HPI:  Sheila Hays is a 52 y/o female with a history of HTN, CKD, GERD, seizures and chronic heart failure.   Echo 06/03/22 showed an EF of 50-55% along with moderate LVH and mild LAE/RAE. Echo report from 11/21/21 reviewed and showed an EF of 55-60% along with mild LVH and mild/moderate LAE. Echo report from 11/30/20 reviewed and showed an EF of 55-60% without LVH.   RHC done 11/25/21 and showed: Hemodynamic findings consistent with severe pulmonary hypertension.  Successful right heart catheterization via the right antecubital vein. This showed evidence of severely elevated right and left-sided filling pressures, severe pulmonary hypertension and normal cardiac output.  RA: 22 mmHg RV: 92/18 with an end-diastolic pressure of 32 mmHg. PA: 95/44 with a mean of 64 mmHg PCW: 30 mmHg  Cardiac output: 7.4 with an index of 3.16. Pulmonary vascular resistance: 4.5 Woods units  Admitted 06/02/22 due to SOB/ cough due to bronchitis due to influenza.   She presents today for a follow-up visit with a chief complaint of minimal SOB with moderate exertion. Describes this as chronic in nature. Has associated cough, dizziness & difficulty sleeping along with this. Denies any abdominal distention, palpitations, pedal edema, chest pain, fatigue or weight gain.      ROS: All systems negative except as listed in HPI, PMH and Problem List.  SH:  Social History   Socioeconomic History   Marital status: Single    Spouse name: Not on file   Number of children: Not on file   Years of education: Not on file   Highest education level: Not on file  Occupational History   Not on file  Tobacco Use   Smoking status: Former    Current packs/day: 0.00    Average packs/day: 0.5 packs/day for 7.0 years (3.5 ttl pk-yrs)    Types: Cigarettes    Start date: 2011    Quit date: 2018    Years since quitting: 6.7   Smokeless tobacco: Former    Quit date: 03/07/2007  Vaping Use   Vaping  status: Never Used  Substance and Sexual Activity   Alcohol use: Not Currently   Drug use: Never   Sexual activity: Not Currently  Other Topics Concern   Not on file  Social History Narrative   Not on file   Social Determinants of Health   Financial Resource Strain: Not on file  Food Insecurity: No Food Insecurity (06/03/2022)   Hunger Vital Sign    Worried About Running Out of Food in the Last Year: Never true    Ran Out of Food in the Last Year: Never true  Transportation Needs: No Transportation Needs (06/03/2022)   PRAPARE - Administrator, Civil Service (Medical): No    Lack of Transportation (Non-Medical): No  Physical Activity: Not on file  Stress: Not on file  Social Connections: Not on file  Intimate Partner Violence: Not At Risk (06/03/2022)   Humiliation, Afraid, Rape, and Kick questionnaire    Fear of Current or Ex-Partner: No    Emotionally Abused: No    Physically Abused: No    Sexually Abused: No    FH:  Family History  Problem Relation Age of Onset   Hypertension Mother    Hypertension Father    Heart failure Sister    Diabetes Sister    Seizures Sister    Breast cancer Neg Hx     Past Medical History:  Diagnosis Date   CHF (congestive heart  failure) (HCC)    Chronic kidney disease (CKD), stage III (moderate) (HCC)    GERD (gastroesophageal reflux disease)    Hypertension    Seizures (HCC)    Followed at [facility]    Current Outpatient Medications  Medication Sig Dispense Refill   acetaminophen (TYLENOL) 500 MG tablet Take 500 mg by mouth every 8 (eight) hours as needed for mild pain.     Albuterol-Budesonide (AIRSUPRA) 90-80 MCG/ACT AERO Inhale 2 puffs into the lungs every 4 (four) hours as needed. 10.7 g 0   aspirin EC 81 MG tablet Take 81 mg by mouth daily.     budesonide-formoterol (SYMBICORT) 80-4.5 MCG/ACT inhaler Take 2 puffs first thing in am and then another 2 puffs about 12 hours later. 1 each 12   carvedilol (COREG) 25 MG tablet  Take 1 tablet (25 mg total) by mouth 2 (two) times daily. 60 tablet 3   famotidine (PEPCID) 20 MG tablet One after supper 30 tablet 11   folic acid (FOLVITE) 1 MG tablet Take 0.5 tablets (0.5 mg total) by mouth daily. 30 tablet 0   hydrALAZINE (APRESOLINE) 25 MG tablet Take 1 tablet (25 mg total) by mouth 3 (three) times daily. (Patient taking differently: Take 50 mg by mouth 2 (two) times daily.) 90 tablet 1   ipratropium-albuterol (DUONEB) 0.5-2.5 (3) MG/3ML SOLN Take 3 mLs by nebulization every 6 (six) hours as needed.     LINZESS 145 MCG CAPS capsule Take 145 mcg by mouth daily.     montelukast (SINGULAIR) 10 MG tablet Take 10 mg by mouth daily.     oxcarbazepine (TRILEPTAL) 600 MG tablet Take 600 mg by mouth 2 (two) times daily.     rosuvastatin (CRESTOR) 20 MG tablet Take 20 mg by mouth at bedtime.     sacubitril-valsartan (ENTRESTO) 24-26 MG Take 1 tablet by mouth 2 (two) times daily. 60 tablet 3   torsemide (DEMADEX) 20 MG tablet Take 1 tablet (20 mg total) by mouth daily. 30 tablet 1   traZODone (DESYREL) 100 MG tablet Take 100 mg by mouth at bedtime as needed for sleep.     vitamin C (ASCORBIC ACID) 500 MG tablet Take 500 mg by mouth daily.     No current facility-administered medications for this visit.    There were no vitals filed for this visit.  PHYSICAL EXAM:  General:  Well appearing. No resp difficulty HEENT: normal Neck: supple. JVP flat. Carotids 2+ bilaterally; no bruits. No lymphadenopathy or thryomegaly appreciated. Cor: PMI normal. Regular rate & rhythm. No rubs, gallops or murmurs. Lungs: clear Abdomen: soft, nontender, nondistended. No hepatosplenomegaly. No bruits or masses. Good bowel sounds. Extremities: no cyanosis, clubbing, rash, edema Neuro: alert & orientedx3, cranial nerves grossly intact. Moves all 4 extremities w/o difficulty. Affect pleasant.   ECG:   ASSESSMENT & PLAN:  1: Chronic heart failure with preserved ejection fraction with LVH- -  NYHA class II - euvolemic today - weighing daily; reminded to call for an overnight weight gain of > 2 pounds or a weekly weight gain of > 5 pounds - weight down 4 pounds from last visit here 5 months ago - not adding salt; reviewed the importance of reading food labels to keep her daily sodium intake to 2000mg  / day - trying to increase her activity - saw cardiology Fransico Michael) 05/30/22 - carvedilol 12.5mg  BID; increase this to 25mg  BID - hydralazine 50mg  BID - entresto 24/ 26mg  BID - torsemide 20mg  daily - BMP today - consider adding  spiro at next visit if renal function is stable - BNP 06/05/22 was 149.6 - PharmD reconciled meds w/ patient  2: HTN- - BP 168/85; increasing carvedilol to 25mg  BID - seeing PCP at [facility] 06/07/22 reviewed and showed sodium 141, potassium 3.4, creatinine 1.73 and GFR 35 - saw nephrology Cassie Freer) 01/21/21  3: Seizures- - saw neurology Marlene Bast) 12/13/20 - on trileptal   Medication list reviewed.   Return in 3-4 weeks, sooner if needed.

## 2023-02-17 LAB — LAB REPORT - SCANNED: EGFR: 46

## 2023-02-18 ENCOUNTER — Encounter: Payer: 59 | Admitting: Family

## 2023-02-18 ENCOUNTER — Encounter: Payer: Self-pay | Admitting: Pharmacy Technician

## 2023-02-18 NOTE — Progress Notes (Deleted)
PCP: Primary Cardiologist:  HPI:  Sheila Hays is a 52 y/o female with a history of HTN, CKD, GERD, seizures and chronic heart failure.   Admitted 06/02/22 due to SOB/ cough due to bronchitis due to influenza.   Echo 11/30/20: EF 55-60% without LVH. Echo 11/21/21: EF 55-60% along with mild LVH and mild/moderate LAE Echo 06/03/22: EF 50-55% along with moderate LVH and mild LAE/RAE.     RHC done 11/25/21 and showed: Hemodynamic findings consistent with severe pulmonary hypertension.  Successful right heart catheterization via the right antecubital vein. This showed evidence of severely elevated right and left-sided filling pressures, severe pulmonary hypertension and normal cardiac output.  RA: 22 mmHg RV: 92/18 with an end-diastolic pressure of 32 mmHg. PA: 95/44 with a mean of 64 mmHg PCW: 30 mmHg  Cardiac output: 7.4 with an index of 3.16. Pulmonary vascular resistance: 4.5 Woods units   She presents today for a follow-up visit with a chief complaint of   At last visit carvedilol was increased to 25mg  BID.    ROS: All systems negative except as listed in HPI, PMH and Problem List.  SH:  Social History   Socioeconomic History   Marital status: Single    Spouse name: Not on file   Number of children: Not on file   Years of education: Not on file   Highest education level: Not on file  Occupational History   Not on file  Tobacco Use   Smoking status: Former    Current packs/day: 0.00    Average packs/day: 0.5 packs/day for 7.0 years (3.5 ttl pk-yrs)    Types: Cigarettes    Start date: 2011    Quit date: 2018    Years since quitting: 6.7   Smokeless tobacco: Former    Quit date: 03/07/2007  Vaping Use   Vaping status: Never Used  Substance and Sexual Activity   Alcohol use: Not Currently   Drug use: Never   Sexual activity: Not Currently  Other Topics Concern   Not on file  Social History Narrative   Not on file   Social Determinants of Health   Financial Resource  Strain: Not on file  Food Insecurity: No Food Insecurity (06/03/2022)   Hunger Vital Sign    Worried About Running Out of Food in the Last Year: Never true    Ran Out of Food in the Last Year: Never true  Transportation Needs: No Transportation Needs (06/03/2022)   PRAPARE - Administrator, Civil Service (Medical): No    Lack of Transportation (Non-Medical): No  Physical Activity: Not on file  Stress: Not on file  Social Connections: Not on file  Intimate Partner Violence: Not At Risk (06/03/2022)   Humiliation, Afraid, Rape, and Kick questionnaire    Fear of Current or Ex-Partner: No    Emotionally Abused: No    Physically Abused: No    Sexually Abused: No    FH:  Family History  Problem Relation Age of Onset   Hypertension Mother    Hypertension Father    Heart failure Sister    Diabetes Sister    Seizures Sister    Breast cancer Neg Hx     Past Medical History:  Diagnosis Date   CHF (congestive heart failure) (HCC)    Chronic kidney disease (CKD), stage III (moderate) (HCC)    GERD (gastroesophageal reflux disease)    Hypertension    Seizures (HCC)    Followed at [facility]    Current  Outpatient Medications  Medication Sig Dispense Refill   acetaminophen (TYLENOL) 500 MG tablet Take 500 mg by mouth every 8 (eight) hours as needed for mild pain.     Albuterol-Budesonide (AIRSUPRA) 90-80 MCG/ACT AERO Inhale 2 puffs into the lungs every 4 (four) hours as needed. 10.7 g 0   aspirin EC 81 MG tablet Take 81 mg by mouth daily.     budesonide-formoterol (SYMBICORT) 80-4.5 MCG/ACT inhaler Take 2 puffs first thing in am and then another 2 puffs about 12 hours later. 1 each 12   carvedilol (COREG) 25 MG tablet Take 1 tablet (25 mg total) by mouth 2 (two) times daily. 60 tablet 3   famotidine (PEPCID) 20 MG tablet One after supper 30 tablet 11   folic acid (FOLVITE) 1 MG tablet Take 0.5 tablets (0.5 mg total) by mouth daily. 30 tablet 0   hydrALAZINE (APRESOLINE) 25 MG tablet  Take 1 tablet (25 mg total) by mouth 3 (three) times daily. (Patient taking differently: Take 50 mg by mouth 2 (two) times daily.) 90 tablet 1   ipratropium-albuterol (DUONEB) 0.5-2.5 (3) MG/3ML SOLN Take 3 mLs by nebulization every 6 (six) hours as needed.     LINZESS 145 MCG CAPS capsule Take 145 mcg by mouth daily.     montelukast (SINGULAIR) 10 MG tablet Take 10 mg by mouth daily.     oxcarbazepine (TRILEPTAL) 600 MG tablet Take 600 mg by mouth 2 (two) times daily.     rosuvastatin (CRESTOR) 20 MG tablet Take 20 mg by mouth at bedtime.     sacubitril-valsartan (ENTRESTO) 24-26 MG Take 1 tablet by mouth 2 (two) times daily. 60 tablet 3   torsemide (DEMADEX) 20 MG tablet Take 1 tablet (20 mg total) by mouth daily. 30 tablet 1   traZODone (DESYREL) 100 MG tablet Take 100 mg by mouth at bedtime as needed for sleep.     vitamin C (ASCORBIC ACID) 500 MG tablet Take 500 mg by mouth daily.     No current facility-administered medications for this visit.      PHYSICAL EXAM:  General:  Well appearing. No resp difficulty HEENT: normal Neck: supple. JVP flat. No lymphadenopathy or thryomegaly appreciated. Cor: PMI normal. Regular rate & rhythm. No rubs, gallops or murmurs. Lungs: clear Abdomen: soft, nontender, nondistended. No hepatosplenomegaly. No bruits or masses.  Extremities: no cyanosis, clubbing, rash, edema Neuro: alert & orientedx3, cranial nerves grossly intact. Moves all 4 extremities w/o difficulty. Affect pleasant.   ECG:   ASSESSMENT & PLAN:  1: Chronic heart failure with preserved ejection fraction with LVH- - NYHA class II - euvolemic today - weighing daily; reminded to call for an overnight weight gain of > 2 pounds or a weekly weight gain of > 5 pounds - weight 254 pounds from last visit here 7 months ago - not adding salt; reviewed the importance of reading food labels to keep her daily sodium intake to 2000mg  / day - trying to increase her activity - saw  cardiology Fransico Michael) 01/24 - carvedilol 12.5mg  BID; increase this to 25mg  BID - hydralazine 50mg  BID - entresto 24/ 26mg  BID - torsemide 20mg  daily - BMP today - consider adding spiro at next visit if renal function is stable - BNP 06/05/22 was 149.6 - PharmD reconciled meds w/ patient  2: HTN- - BP  - seeing PCP at [facility] 06/07/22 reviewed and showed sodium 141, potassium 3.4, creatinine 1.73 and GFR 35 - saw nephrology Cassie Freer) 01/21/21  3: Seizures- - saw neurology Marlene Bast) 12/13/20 - on trileptal   Medication list reviewed.   Return in 3-4 weeks, sooner if needed.

## 2023-02-20 ENCOUNTER — Encounter: Payer: 59 | Admitting: Family

## 2023-02-24 ENCOUNTER — Encounter: Payer: Self-pay | Admitting: Family

## 2023-02-24 ENCOUNTER — Ambulatory Visit: Payer: 59 | Attending: Family | Admitting: Family

## 2023-02-24 VITALS — BP 159/87 | HR 70 | Wt 251.6 lb

## 2023-02-24 DIAGNOSIS — I509 Heart failure, unspecified: Secondary | ICD-10-CM | POA: Diagnosis not present

## 2023-02-24 DIAGNOSIS — Z87891 Personal history of nicotine dependence: Secondary | ICD-10-CM | POA: Diagnosis not present

## 2023-02-24 DIAGNOSIS — E785 Hyperlipidemia, unspecified: Secondary | ICD-10-CM | POA: Insufficient documentation

## 2023-02-24 DIAGNOSIS — I10 Essential (primary) hypertension: Secondary | ICD-10-CM | POA: Diagnosis not present

## 2023-02-24 DIAGNOSIS — I272 Pulmonary hypertension, unspecified: Secondary | ICD-10-CM | POA: Insufficient documentation

## 2023-02-24 DIAGNOSIS — N183 Chronic kidney disease, stage 3 unspecified: Secondary | ICD-10-CM | POA: Diagnosis not present

## 2023-02-24 DIAGNOSIS — G4733 Obstructive sleep apnea (adult) (pediatric): Secondary | ICD-10-CM | POA: Diagnosis not present

## 2023-02-24 DIAGNOSIS — I5032 Chronic diastolic (congestive) heart failure: Secondary | ICD-10-CM | POA: Diagnosis not present

## 2023-02-24 DIAGNOSIS — K219 Gastro-esophageal reflux disease without esophagitis: Secondary | ICD-10-CM | POA: Insufficient documentation

## 2023-02-24 DIAGNOSIS — R002 Palpitations: Secondary | ICD-10-CM | POA: Diagnosis not present

## 2023-02-24 DIAGNOSIS — Z79899 Other long term (current) drug therapy: Secondary | ICD-10-CM | POA: Diagnosis not present

## 2023-02-24 DIAGNOSIS — I428 Other cardiomyopathies: Secondary | ICD-10-CM | POA: Insufficient documentation

## 2023-02-24 DIAGNOSIS — I130 Hypertensive heart and chronic kidney disease with heart failure and stage 1 through stage 4 chronic kidney disease, or unspecified chronic kidney disease: Secondary | ICD-10-CM | POA: Diagnosis not present

## 2023-02-24 MED ORDER — ENTRESTO 49-51 MG PO TABS: 1.0000 | ORAL_TABLET | Freq: Two times a day (BID) | ORAL | 5 refills | Status: AC

## 2023-02-24 NOTE — Progress Notes (Signed)
PCP: Sumner Boast, MD (last seen 09/24) Primary Cardiologist: Terrilee Croak, PA (last seen 01/24)  HPI:  Sheila Hays is a 52 y/o female with a history of HTN, hyperlipidemia, OSA, anemia, CKD, GERD, seizures and chronic heart failure. She had previously underwent nuclear stress test in 2016 that was abnormal, notable for perfusion defect of the lateral inferior segment with an EF of 35%. Echo at that time demonstrated an EF of 25-30%, mildly to moderately dilated LV cavity size, mild to moderate concentric LVH, mild to moderate MR, mild TR, PASP 55-60 mmHg, and a trivial pericardial effusion. Repeat echo in 2018 demonstrated a low normal LVSF with an EF of 50-55%, normal wall motion.  She was admitted 6/28-7/14/23 for shortness of breath and chest pain. BNP ws 500s. CXR with b/l pleural effusions and concern for sepsis. She was admitted for diuresis with IV lasix and hypertensive urgency. RHC 11/25/21 showed severe pulmonary HTN. She had a fall in the bathroom with negative work-up. She was moved to the ICU for milrinone drip and PICC line. Sildenafil and BB were held for hypotension. Required norepinephrine. She was eventually transitioned to torsmide 40mg  daily.    Admitted 06/02/22 due to SOB/ cough due to bronchitis due to influenza.   Echo 11/30/20: EF 55-60% without LVH. Echo 11/21/21: EF 55-60% along with mild LVH and mild/moderate LAE Echo 06/03/22: EF 50-55% along with moderate LVH and mild LAE/RAE.     RHC done 11/25/21 and showed: Hemodynamic findings consistent with severe pulmonary hypertension.  Successful right heart catheterization via the right antecubital vein. This showed evidence of severely elevated right and left-sided filling pressures, severe pulmonary hypertension and normal cardiac output.  RA: 22 mmHg RV: 92/18 with an end-diastolic pressure of 32 mmHg. PA: 95/44 with a mean of 64 mmHg PCW: 30 mmHg  Cardiac output: 7.4 with an index of 3.16. Pulmonary vascular  resistance: 4.5 Woods units  She presents today for a follow-up visit with a chief complaint of minimal fatigue with moderate exertion. Chronic in nature. Has associated intermittent palpitations, chronic difficulty sleeping, pedal edema (improving) & weight gain (although improving). She denies chest pain, cough, abdominal distention or dizziness. She is drinking 64 oz water daily.  She has been seeing her PCP frequently recently due to increased weight/ edema but feels like she is improving. Currently being treated for a UTI.   At last visit carvedilol was increased to 25mg  BID. Her PCP increased her hydralazine yesterday to 100mg  TID.   She reports that she is checking her blood pressure at home and says that it's "been fine".   ROS: All systems negative except as listed in HPI, PMH and Problem List.  SH:  Social History   Socioeconomic History   Marital status: Single    Spouse name: Not on file   Number of children: Not on file   Years of education: Not on file   Highest education level: Not on file  Occupational History   Not on file  Tobacco Use   Smoking status: Former    Current packs/day: 0.00    Average packs/day: 0.5 packs/day for 7.0 years (3.5 ttl pk-yrs)    Types: Cigarettes    Start date: 2011    Quit date: 2018    Years since quitting: 6.7   Smokeless tobacco: Former    Quit date: 03/07/2007  Vaping Use   Vaping status: Never Used  Substance and Sexual Activity   Alcohol use: Not Currently   Drug use: Never  Sexual activity: Not Currently  Other Topics Concern   Not on file  Social History Narrative   Not on file   Social Determinants of Health   Financial Resource Strain: Not on file  Food Insecurity: No Food Insecurity (06/03/2022)   Hunger Vital Sign    Worried About Running Out of Food in the Last Year: Never true    Ran Out of Food in the Last Year: Never true  Transportation Needs: No Transportation Needs (06/03/2022)   PRAPARE - Therapist, art (Medical): No    Lack of Transportation (Non-Medical): No  Physical Activity: Not on file  Stress: Not on file  Social Connections: Not on file  Intimate Partner Violence: Not At Risk (06/03/2022)   Humiliation, Afraid, Rape, and Kick questionnaire    Fear of Current or Ex-Partner: No    Emotionally Abused: No    Physically Abused: No    Sexually Abused: No    FH:  Family History  Problem Relation Age of Onset   Hypertension Mother    Hypertension Father    Heart failure Sister    Diabetes Sister    Seizures Sister    Breast cancer Neg Hx     Past Medical History:  Diagnosis Date   CHF (congestive heart failure) (HCC)    Chronic kidney disease (CKD), stage III (moderate) (HCC)    GERD (gastroesophageal reflux disease)    Hypertension    Seizures (HCC)    Followed at [facility]    Current Outpatient Medications  Medication Sig Dispense Refill   acetaminophen (TYLENOL) 500 MG tablet Take 500 mg by mouth every 8 (eight) hours as needed for mild pain.     Albuterol-Budesonide (AIRSUPRA) 90-80 MCG/ACT AERO Inhale 2 puffs into the lungs every 4 (four) hours as needed. 10.7 g 0   aspirin EC 81 MG tablet Take 81 mg by mouth daily.     budesonide-formoterol (SYMBICORT) 80-4.5 MCG/ACT inhaler Take 2 puffs first thing in am and then another 2 puffs about 12 hours later. 1 each 12   carvedilol (COREG) 25 MG tablet Take 1 tablet (25 mg total) by mouth 2 (two) times daily. 60 tablet 3   famotidine (PEPCID) 20 MG tablet One after supper 30 tablet 11   folic acid (FOLVITE) 1 MG tablet Take 0.5 tablets (0.5 mg total) by mouth daily. 30 tablet 0   hydrALAZINE (APRESOLINE) 25 MG tablet Take 1 tablet (25 mg total) by mouth 3 (three) times daily. (Patient taking differently: Take 50 mg by mouth 2 (two) times daily.) 90 tablet 1   ipratropium-albuterol (DUONEB) 0.5-2.5 (3) MG/3ML SOLN Take 3 mLs by nebulization every 6 (six) hours as needed.     LINZESS 145 MCG CAPS capsule  Take 145 mcg by mouth daily.     montelukast (SINGULAIR) 10 MG tablet Take 10 mg by mouth daily.     oxcarbazepine (TRILEPTAL) 600 MG tablet Take 600 mg by mouth 2 (two) times daily.     rosuvastatin (CRESTOR) 20 MG tablet Take 20 mg by mouth at bedtime.     sacubitril-valsartan (ENTRESTO) 24-26 MG Take 1 tablet by mouth 2 (two) times daily. 60 tablet 3   torsemide (DEMADEX) 20 MG tablet Take 1 tablet (20 mg total) by mouth daily. 30 tablet 1   traZODone (DESYREL) 100 MG tablet Take 100 mg by mouth at bedtime as needed for sleep.     vitamin C (ASCORBIC ACID) 500 MG tablet Take 500  mg by mouth daily.     No current facility-administered medications for this visit.   Vitals:   02/24/23 1448  BP: (!) 159/87  Pulse: 70  SpO2: 99%  Weight: 251 lb 9.6 oz (114.1 kg)   Wt Readings from Last 3 Encounters:  02/24/23 251 lb 9.6 oz (114.1 kg)  01/21/23 247 lb (112 kg)  07/18/22 254 lb (115.2 kg)   Lab Results  Component Value Date   CREATININE 1.49 (H) 07/18/2022   CREATININE 1.73 (H) 06/07/2022   CREATININE 1.51 (H) 06/06/2022   PHYSICAL EXAM:  General:  Well appearing. No resp difficulty HEENT: normal Neck: supple. JVP flat. No lymphadenopathy or thryomegaly appreciated. Cor: PMI normal. Regular rate & rhythm. No rubs, gallops or murmurs. Lungs: clear Abdomen: soft, nontender, nondistended. No hepatosplenomegaly. No bruits or masses.  Extremities: no cyanosis, clubbing, rash, 1+ pitting edema bilateral lower legs Neuro: alert & orientedx3, cranial nerves grossly intact. Moves all 4 extremities w/o difficulty. Affect pleasant.   ECG: not done   ASSESSMENT & PLAN:  1: NICM with preserved ejection fraction- - suspect due to uncontrolled HTN - NYHA class II - euvolemic today - weighing daily; reminded to call for an overnight weight gain of > 2 pounds or a weekly weight gain of > 5 pounds - weight down 3 pounds from last visit here 7 months ago - Echo 11/30/20: EF 55-60% without  LVH. - Echo 11/21/21: EF 55-60% along with mild LVH and mild/moderate LAE - Echo 06/03/22: EF 50-55% along with moderate LVH and mild LAE/RAE.  - not adding salt; reviewed the importance of reading food labels to keep her daily sodium intake to 2000mg  / day - trying to increase her activity - saw cardiology Fransico Michael) 01/24 - continue carvedilol 25mg  BID - continue hydralazine 100mg  TID - increase entresto to 49/51 mg BID - continue torsemide 20mg  daily - consider adding spiro at next visit if renal function is stable - will get most recent BMP results from PCP office as she says that she had lab work drawn ~ 2 weeks ago - BNP 06/05/22 was 149.6  2: HTN- - BP  - saw PCP at [facility] 09/24 - BMP 07/18/22 reviewed and showed sodium 141, potassium 3.7, creatinine 1.49 and GFR 42 - saw nephrology (Korrapati) 02/24  3: OSA- - has had previous sleep study  4: Pulmonary HTN- - RHC done 11/25/21:  Hemodynamic findings consistent with severe pulmonary hypertension.  Successful right heart catheterization via the right antecubital vein. This showed evidence of severely elevated right and left-sided filling pressures, severe pulmonary hypertension and normal cardiac output.  RA: 22 mmHg RV: 92/18 with an end-diastolic pressure of 32 mmHg. PA: 95/44 with a mean of 64 mmHg PCW: 30 mmHg  Cardiac output: 7.4 with an index of 3.16. Pulmonary vascular resistance: 4.5 Woods units  Return in 1 month, sooner if needed. Emphasized bringing medication bottles and BP log to every visit.

## 2023-02-24 NOTE — Patient Instructions (Addendum)
Bring ALL medications including any over the counter or vitamins to every visit.  Bring blood pressure log to every visit.   FINISH YOUR CURRENT ENTRESTO 24-26 PRESCRIPTION BY TAKING 2 PILLS TWICE DAILY UNTIL YOU RUN OUT  THEN, PICK UP AND START ENTRESTO 49-51 MG  1 PILL TWICE DAILY

## 2023-03-04 ENCOUNTER — Telehealth: Payer: Self-pay | Admitting: Family

## 2023-03-04 NOTE — Telephone Encounter (Signed)
Labs received from PCP office dated 02/17/23:  Sodium 145 Potassium 4.2 Creatinine 1.37 GFR 46 BUN 22 proBNP 5801 Magnesium 1.8

## 2023-03-30 NOTE — Progress Notes (Deleted)
PCP: Sumner Boast, MD (last seen 09/24) Primary Cardiologist: Terrilee Croak, PA (last seen 01/24)  HPI:  Ms Freestone is a 52 y/o female with a history of HTN, hyperlipidemia, OSA, anemia, CKD, GERD, seizures and chronic heart failure. She had previously underwent nuclear stress test in 2016 that was abnormal, notable for perfusion defect of the lateral inferior segment with an EF of 35%. Echo at that time demonstrated an EF of 25-30%, mildly to moderately dilated LV cavity size, mild to moderate concentric LVH, mild to moderate MR, mild TR, PASP 55-60 mmHg, and a trivial pericardial effusion. Repeat echo in 2018 demonstrated a low normal LVSF with an EF of 50-55%, normal wall motion.  She was admitted 6/28-7/14/23 for shortness of breath and chest pain. BNP ws 500s. CXR with b/l pleural effusions and concern for sepsis. She was admitted for diuresis with IV lasix and hypertensive urgency. RHC 11/25/21 showed severe pulmonary HTN. She had a fall in the bathroom with negative work-up. She was moved to the ICU for milrinone drip and PICC line. Sildenafil and BB were held for hypotension. Required norepinephrine. She was eventually transitioned to torsmide 40mg  daily.    Admitted 06/02/22 due to SOB/ cough due to bronchitis due to influenza.   Echo 11/30/20: EF 55-60% without LVH. Echo 11/21/21: EF 55-60% along with mild LVH and mild/moderate LAE Echo 06/03/22: EF 50-55% along with moderate LVH and mild LAE/RAE.     RHC done 11/25/21 and showed: Hemodynamic findings consistent with severe pulmonary hypertension.  Successful right heart catheterization via the right antecubital vein. This showed evidence of severely elevated right and left-sided filling pressures, severe pulmonary hypertension and normal cardiac output.  RA: 22 mmHg RV: 92/18 with an end-diastolic pressure of 32 mmHg. PA: 95/44 with a mean of 64 mmHg PCW: 30 mmHg  Cardiac output: 7.4 with an index of 3.16. Pulmonary vascular  resistance: 4.5 Woods units  She presents today for a follow-up visit with a chief complaint of minimal fatigue with moderate exertion. Chronic in nature. Has associated intermittent palpitations, chronic difficulty sleeping, pedal edema (improving) & weight gain (although improving). She denies chest pain, cough, abdominal distention or dizziness. She is drinking 64 oz water daily.  She has been seeing her PCP frequently recently due to increased weight/ edema but feels like she is improving. Currently being treated for a UTI.   At last visit carvedilol was increased to 25mg  BID. Her PCP increased her hydralazine yesterday to 100mg  TID.   She reports that she is checking her blood pressure at home and says that it's "been fine".   ROS: All systems negative except as listed in HPI, PMH and Problem List.  SH:  Social History   Socioeconomic History   Marital status: Single    Spouse name: Not on file   Number of children: Not on file   Years of education: Not on file   Highest education level: Not on file  Occupational History   Not on file  Tobacco Use   Smoking status: Former    Current packs/day: 0.00    Average packs/day: 0.5 packs/day for 7.0 years (3.5 ttl pk-yrs)    Types: Cigarettes    Start date: 2011    Quit date: 2018    Years since quitting: 6.8   Smokeless tobacco: Former    Quit date: 03/07/2007  Vaping Use   Vaping status: Never Used  Substance and Sexual Activity   Alcohol use: Not Currently   Drug use: Never  Sexual activity: Not Currently  Other Topics Concern   Not on file  Social History Narrative   Not on file   Social Determinants of Health   Financial Resource Strain: Not on file  Food Insecurity: No Food Insecurity (06/03/2022)   Hunger Vital Sign    Worried About Running Out of Food in the Last Year: Never true    Ran Out of Food in the Last Year: Never true  Transportation Needs: No Transportation Needs (06/03/2022)   PRAPARE - Therapist, art (Medical): No    Lack of Transportation (Non-Medical): No  Physical Activity: Not on file  Stress: Not on file  Social Connections: Not on file  Intimate Partner Violence: Not At Risk (06/03/2022)   Humiliation, Afraid, Rape, and Kick questionnaire    Fear of Current or Ex-Partner: No    Emotionally Abused: No    Physically Abused: No    Sexually Abused: No    FH:  Family History  Problem Relation Age of Onset   Hypertension Mother    Hypertension Father    Heart failure Sister    Diabetes Sister    Seizures Sister    Breast cancer Neg Hx     Past Medical History:  Diagnosis Date   CHF (congestive heart failure) (HCC)    Chronic kidney disease (CKD), stage III (moderate) (HCC)    GERD (gastroesophageal reflux disease)    Hypertension    Seizures (HCC)    Followed at [facility]    Current Outpatient Medications  Medication Sig Dispense Refill   acetaminophen (TYLENOL) 500 MG tablet Take 500 mg by mouth every 8 (eight) hours as needed for mild pain.     Albuterol-Budesonide (AIRSUPRA) 90-80 MCG/ACT AERO Inhale 2 puffs into the lungs every 4 (four) hours as needed. 10.7 g 0   aspirin EC 81 MG tablet Take 81 mg by mouth daily.     budesonide-formoterol (SYMBICORT) 80-4.5 MCG/ACT inhaler Take 2 puffs first thing in am and then another 2 puffs about 12 hours later. 1 each 12   carvedilol (COREG) 25 MG tablet Take 1 tablet (25 mg total) by mouth 2 (two) times daily. 60 tablet 3   hydrALAZINE (APRESOLINE) 25 MG tablet Take 1 tablet (25 mg total) by mouth 3 (three) times daily. (Patient taking differently: Take 100 mg by mouth 3 (three) times daily.) 90 tablet 1   ipratropium-albuterol (DUONEB) 0.5-2.5 (3) MG/3ML SOLN Take 3 mLs by nebulization every 6 (six) hours as needed.     LINZESS 145 MCG CAPS capsule Take 145 mcg by mouth daily.     montelukast (SINGULAIR) 10 MG tablet Take 10 mg by mouth daily.     oxcarbazepine (TRILEPTAL) 600 MG tablet Take 600 mg by  mouth 2 (two) times daily.     rosuvastatin (CRESTOR) 20 MG tablet Take 20 mg by mouth at bedtime.     sacubitril-valsartan (ENTRESTO) 24-26 MG Take 1 tablet by mouth 2 (two) times daily. 60 tablet 3   sacubitril-valsartan (ENTRESTO) 49-51 MG Take 1 tablet by mouth 2 (two) times daily. 60 tablet 5   torsemide (DEMADEX) 20 MG tablet Take 1 tablet (20 mg total) by mouth daily. 30 tablet 1   traZODone (DESYREL) 100 MG tablet Take 100 mg by mouth at bedtime as needed for sleep. (Patient not taking: Reported on 02/24/2023)     vitamin C (ASCORBIC ACID) 500 MG tablet Take 500 mg by mouth daily.     No  current facility-administered medications for this visit.   There were no vitals filed for this visit.  Wt Readings from Last 3 Encounters:  02/24/23 251 lb 9.6 oz (114.1 kg)  01/21/23 247 lb (112 kg)  07/18/22 254 lb (115.2 kg)   Lab Results  Component Value Date   CREATININE 1.49 (H) 07/18/2022   CREATININE 1.73 (H) 06/07/2022   CREATININE 1.51 (H) 06/06/2022   PHYSICAL EXAM:  General:  Well appearing. No resp difficulty HEENT: normal Neck: supple. JVP flat. No lymphadenopathy or thryomegaly appreciated. Cor: PMI normal. Regular rate & rhythm. No rubs, gallops or murmurs. Lungs: clear Abdomen: soft, nontender, nondistended. No hepatosplenomegaly. No bruits or masses.  Extremities: no cyanosis, clubbing, rash, 1+ pitting edema bilateral lower legs Neuro: alert & orientedx3, cranial nerves grossly intact. Moves all 4 extremities w/o difficulty. Affect pleasant.   ECG: not done   ASSESSMENT & PLAN:  1: NICM with preserved ejection fraction- - suspect due to uncontrolled HTN - NYHA class II - euvolemic today - weighing daily; reminded to call for an overnight weight gain of > 2 pounds or a weekly weight gain of > 5 pounds - weight down 3 pounds from last visit here 7 months ago - Echo 11/30/20: EF 55-60% without LVH. - Echo 11/21/21: EF 55-60% along with mild LVH and mild/moderate  LAE - Echo 06/03/22: EF 50-55% along with moderate LVH and mild LAE/RAE.  - not adding salt; reviewed the importance of reading food labels to keep her daily sodium intake to 2000mg  / day - trying to increase her activity - saw cardiology Fransico Michael) 01/24 - continue carvedilol 25mg  BID - continue hydralazine 100mg  TID - increase entresto to 49/51 mg BID - continue torsemide 20mg  daily - consider adding spiro at next visit if renal function is stable - will get most recent BMP results from PCP office as she says that she had lab work drawn ~ 2 weeks ago - BNP 06/05/22 was 149.6  2: HTN- - BP  - saw PCP at [facility] ([place] 09/24 - BMP 07/18/22 reviewed and showed sodium 141, potassium 3.7, creatinine 1.49 and GFR 42 - saw nephrology (Korrapati) 02/24  3: OSA- - has had previous sleep study  4: Pulmonary HTN- - RHC done 11/25/21:  Hemodynamic findings consistent with severe pulmonary hypertension.  Successful right heart catheterization via the right antecubital vein. This showed evidence of severely elevated right and left-sided filling pressures, severe pulmonary hypertension and normal cardiac output.  RA: 22 mmHg RV: 92/18 with an end-diastolic pressure of 32 mmHg. PA: 95/44 with a mean of 64 mmHg PCW: 30 mmHg  Cardiac output: 7.4 with an index of 3.16. Pulmonary vascular resistance: 4.5 Woods units  Return in 1 month, sooner if needed. Emphasized bringing medication bottles and BP log to every visit.

## 2023-03-31 ENCOUNTER — Encounter: Payer: 59 | Admitting: Family

## 2023-03-31 LAB — LAB REPORT - SCANNED: EGFR: 38

## 2023-04-03 ENCOUNTER — Ambulatory Visit: Payer: 59 | Attending: Family | Admitting: Family

## 2023-04-03 ENCOUNTER — Encounter: Payer: Self-pay | Admitting: Family

## 2023-04-03 VITALS — BP 158/69 | HR 65 | Wt 241.0 lb

## 2023-04-03 DIAGNOSIS — I272 Pulmonary hypertension, unspecified: Secondary | ICD-10-CM | POA: Diagnosis not present

## 2023-04-03 DIAGNOSIS — K5792 Diverticulitis of intestine, part unspecified, without perforation or abscess without bleeding: Secondary | ICD-10-CM | POA: Insufficient documentation

## 2023-04-03 DIAGNOSIS — N183 Chronic kidney disease, stage 3 unspecified: Secondary | ICD-10-CM | POA: Insufficient documentation

## 2023-04-03 DIAGNOSIS — G4733 Obstructive sleep apnea (adult) (pediatric): Secondary | ICD-10-CM | POA: Insufficient documentation

## 2023-04-03 DIAGNOSIS — Z862 Personal history of diseases of the blood and blood-forming organs and certain disorders involving the immune mechanism: Secondary | ICD-10-CM | POA: Insufficient documentation

## 2023-04-03 DIAGNOSIS — I5032 Chronic diastolic (congestive) heart failure: Secondary | ICD-10-CM | POA: Diagnosis not present

## 2023-04-03 DIAGNOSIS — Z87891 Personal history of nicotine dependence: Secondary | ICD-10-CM | POA: Insufficient documentation

## 2023-04-03 DIAGNOSIS — Z79899 Other long term (current) drug therapy: Secondary | ICD-10-CM | POA: Diagnosis not present

## 2023-04-03 DIAGNOSIS — E785 Hyperlipidemia, unspecified: Secondary | ICD-10-CM | POA: Insufficient documentation

## 2023-04-03 DIAGNOSIS — I428 Other cardiomyopathies: Secondary | ICD-10-CM | POA: Insufficient documentation

## 2023-04-03 DIAGNOSIS — I130 Hypertensive heart and chronic kidney disease with heart failure and stage 1 through stage 4 chronic kidney disease, or unspecified chronic kidney disease: Secondary | ICD-10-CM | POA: Diagnosis not present

## 2023-04-03 DIAGNOSIS — I10 Essential (primary) hypertension: Secondary | ICD-10-CM | POA: Diagnosis not present

## 2023-04-03 DIAGNOSIS — K219 Gastro-esophageal reflux disease without esophagitis: Secondary | ICD-10-CM | POA: Insufficient documentation

## 2023-04-03 MED ORDER — SPIRONOLACTONE 25 MG PO TABS: 12.5000 mg | ORAL_TABLET | Freq: Every day | ORAL | 3 refills | Status: DC

## 2023-04-03 NOTE — Progress Notes (Signed)
PCP: Sumner Boast, MD (last seen 11/24) Primary Cardiologist: Terrilee Croak, PA (last seen 01/24)  HPI:  Sheila Hays is a 52 y/o female with a history of HTN, hyperlipidemia, OSA, anemia, CKD, GERD, seizures and chronic heart failure. She had previously underwent nuclear stress test in 2016 that was abnormal, notable for perfusion defect of the lateral inferior segment with an EF of 35%. Echo at that time demonstrated an EF of 25-30%, mildly to moderately dilated LV cavity size, mild to moderate concentric LVH, mild to moderate MR, mild TR, PASP 55-60 mmHg, and a trivial pericardial effusion. Repeat echo in 2018 demonstrated a low normal LVSF with an EF of 50-55%, normal wall motion.  She was admitted 6/28-7/14/23 for shortness of breath and chest pain. BNP ws 500s. CXR with b/l pleural effusions and concern for sepsis. She was admitted for diuresis with IV lasix and hypertensive urgency. RHC 11/25/21 showed severe pulmonary HTN. She had a fall in the bathroom with negative work-up. She was moved to the ICU for milrinone drip and PICC line. Sildenafil and BB were held for hypotension. Required norepinephrine. She was eventually transitioned to torsmide 40mg  daily.    Admitted 06/02/22 due to SOB/ cough due to bronchitis due to influenza.   Echo 11/30/20: EF 55-60% without LVH. Echo 11/21/21: EF 55-60% along with mild LVH and mild/moderate LAE Echo 06/03/22: EF 50-55% along with moderate LVH and mild LAE/RAE.     RHC done 11/25/21 and showed: Hemodynamic findings consistent with severe pulmonary hypertension.  Successful right heart catheterization via the right antecubital vein. This showed evidence of severely elevated right and left-sided filling pressures, severe pulmonary hypertension and normal cardiac output.  RA: 22 mmHg RV: 92/18 with an end-diastolic pressure of 32 mmHg. PA: 95/44 with a mean of 64 mmHg PCW: 30 mmHg  Cardiac output: 7.4 with an index of 3.16. Pulmonary vascular  resistance: 4.5 Woods units  She presents today for a HF follow-up visit with a chief complaint of minimal fatigue upon moderate exertion. Chronic in nature. Has had a decreased appetite due to diverticulitis flare. Denies shortness of breath, chest pain, cough, palpitations, abdominal distention, pedal edema, dizziness or difficulty sleeping. Has been walking on a treadmill 5 days/ week for 20-25 minutes at a time along with lifting some mild weights.   At last HF visit, entresto was increased to 49/51mg  BID & she doesn't notice any issues with taking this dose. Had blood work drawn earlier this week at her PCP office.   ROS: All systems negative except as listed in HPI, PMH and Problem List.  SH:  Social History   Socioeconomic History   Marital status: Single    Spouse name: Not on file   Number of children: Not on file   Years of education: Not on file   Highest education level: Not on file  Occupational History   Not on file  Tobacco Use   Smoking status: Former    Current packs/day: 0.00    Average packs/day: 0.5 packs/day for 7.0 years (3.5 ttl pk-yrs)    Types: Cigarettes    Start date: 2011    Quit date: 2018    Years since quitting: 6.8   Smokeless tobacco: Former    Quit date: 03/07/2007  Vaping Use   Vaping status: Never Used  Substance and Sexual Activity   Alcohol use: Not Currently   Drug use: Never   Sexual activity: Not Currently  Other Topics Concern   Not on file  Social History Narrative   Not on file   Social Determinants of Health   Financial Resource Strain: Not on file  Food Insecurity: No Food Insecurity (06/03/2022)   Hunger Vital Sign    Worried About Running Out of Food in the Last Year: Never true    Ran Out of Food in the Last Year: Never true  Transportation Needs: No Transportation Needs (06/03/2022)   PRAPARE - Administrator, Civil Service (Medical): No    Lack of Transportation (Non-Medical): No  Physical Activity: Not on  file  Stress: Not on file  Social Connections: Not on file  Intimate Partner Violence: Not At Risk (06/03/2022)   Humiliation, Afraid, Rape, and Kick questionnaire    Fear of Current or Ex-Partner: No    Emotionally Abused: No    Physically Abused: No    Sexually Abused: No    FH:  Family History  Problem Relation Age of Onset   Hypertension Mother    Hypertension Father    Heart failure Sister    Diabetes Sister    Seizures Sister    Breast cancer Neg Hx     Past Medical History:  Diagnosis Date   CHF (congestive heart failure) (HCC)    Chronic kidney disease (CKD), stage III (moderate) (HCC)    GERD (gastroesophageal reflux disease)    Hypertension    Seizures (HCC)    Followed at [facility]    Current Outpatient Medications  Medication Sig Dispense Refill   acetaminophen (TYLENOL) 500 MG tablet Take 500 mg by mouth every 8 (eight) hours as needed for mild pain.     Albuterol-Budesonide (AIRSUPRA) 90-80 MCG/ACT AERO Inhale 2 puffs into the lungs every 4 (four) hours as needed. 10.7 g 0   aspirin EC 81 MG tablet Take 81 mg by mouth daily.     budesonide-formoterol (SYMBICORT) 80-4.5 MCG/ACT inhaler Take 2 puffs first thing in am and then another 2 puffs about 12 hours later. 1 each 12   carvedilol (COREG) 25 MG tablet Take 1 tablet (25 mg total) by mouth 2 (two) times daily. 60 tablet 3   hydrALAZINE (APRESOLINE) 25 MG tablet Take 1 tablet (25 mg total) by mouth 3 (three) times daily. (Patient taking differently: Take 100 mg by mouth 3 (three) times daily.) 90 tablet 1   ipratropium-albuterol (DUONEB) 0.5-2.5 (3) MG/3ML SOLN Take 3 mLs by nebulization every 6 (six) hours as needed.     LINZESS 145 MCG CAPS capsule Take 145 mcg by mouth daily.     montelukast (SINGULAIR) 10 MG tablet Take 10 mg by mouth daily.     oxcarbazepine (TRILEPTAL) 600 MG tablet Take 600 mg by mouth 2 (two) times daily.     rosuvastatin (CRESTOR) 20 MG tablet Take 20 mg by mouth at bedtime.      sacubitril-valsartan (ENTRESTO) 24-26 MG Take 1 tablet by mouth 2 (two) times daily. 60 tablet 3   sacubitril-valsartan (ENTRESTO) 49-51 MG Take 1 tablet by mouth 2 (two) times daily. 60 tablet 5   torsemide (DEMADEX) 20 MG tablet Take 1 tablet (20 mg total) by mouth daily. 30 tablet 1   traZODone (DESYREL) 100 MG tablet Take 100 mg by mouth at bedtime as needed for sleep. (Patient not taking: Reported on 02/24/2023)     vitamin C (ASCORBIC ACID) 500 MG tablet Take 500 mg by mouth daily.     No current facility-administered medications for this visit.   Vitals:   04/03/23 1324 04/03/23  1329  BP: (!) 175/86 (!) 158/69  Pulse: 65   SpO2: 99%   Weight: 241 lb (109.3 kg)    Wt Readings from Last 3 Encounters:  04/03/23 241 lb (109.3 kg)  02/24/23 251 lb 9.6 oz (114.1 kg)  01/21/23 247 lb (112 kg)   Lab Results  Component Value Date   CREATININE 1.49 (H) 07/18/2022   CREATININE 1.73 (H) 06/07/2022   CREATININE 1.51 (H) 06/06/2022   PHYSICAL EXAM:  General:  Well appearing. No resp difficulty HEENT: normal Neck: supple. JVP flat. No lymphadenopathy or thryomegaly appreciated. Cor: PMI normal. Regular rate & rhythm. No rubs, gallops or murmurs. Lungs: clear Abdomen: soft, nontender, nondistended. No hepatosplenomegaly. No bruits or masses.  Extremities: no cyanosis, clubbing, rash, trace pitting edema bilateral lower legs Neuro: alert & orientedx3, cranial nerves grossly intact. Moves all 4 extremities w/o difficulty. Affect pleasant.   ECG: not done   ASSESSMENT & PLAN:  1: NICM with preserved ejection fraction- - suspect due to uncontrolled HTN - NYHA class II - euvolemic today - weighing daily; reminded to call for an overnight weight gain of > 2 pounds or a weekly weight gain of > 5 pounds - weight down 10 pounds from last visit here 5 weeks ago - Echo 11/30/20: EF 55-60% without LVH. - Echo 11/21/21: EF 55-60% along with mild LVH and mild/moderate LAE - Echo 06/03/22: EF  50-55% along with moderate LVH and mild LAE/RAE.  - not adding salt; reviewed the importance of reading food labels to keep her daily sodium intake to 2000mg  / day - trying to increase her activity and now walks on the treadmill 5 days/ week for 20-25 minutes at a time - saw cardiology Fransico Michael) 01/24 - continue carvedilol 25mg  BID - continue hydralazine 100mg  TID - continue entresto 49/51 mg BID - continue torsemide 20mg  daily - begin spironolactone 12.5mg  daily - BMET in 7-10 days @ PCP office - BMET here next time - BNP 06/05/22 was 149.6  2: HTN- - BP 175/86 and rechecked was 158/69 - says that home BP runs 130's/60's - saw PCP at [facility] ([place] 11/24 - BMP 03/30/23 (PCP) reviewed and showed sodium 141, potassium 4.7, creatinine 1.59 and GFR 38 - saw nephrology (Korrapati) 02/24  3: OSA- - has had previous sleep study  4: Pulmonary HTN- - RHC done 11/25/21:  Hemodynamic findings consistent with severe pulmonary hypertension.  Successful right heart catheterization via the right antecubital vein. This showed evidence of severely elevated right and left-sided filling pressures, severe pulmonary hypertension and normal cardiac output.  RA: 22 mmHg RV: 92/18 with an end-diastolic pressure of 32 mmHg. PA: 95/44 with a mean of 64 mmHg PCW: 30 mmHg  Cardiac output: 7.4 with an index of 3.16. Pulmonary vascular resistance: 4.5 Woods units  Return in 1 month, sooner if needed.

## 2023-04-03 NOTE — Patient Instructions (Signed)
Medication Changes:  Spironolactone 12.5 mg (0.5 tablet) daily.   Special Instructions // Education:  Do the following things EVERYDAY: Weigh yourself in the morning before breakfast. Write it down and keep it in a log. Take your medicines as prescribed Eat low salt foods--Limit salt (sodium) to 2000 mg per day.  Stay as active as you can everyday Limit all fluids for the day to less than 2 liters   Follow-Up in: 1 month     If you have any questions or concerns before your next appointment please send [place] a message through mychart or call our office at [phone] Monday-Friday 8 am-5 pm.   If you have an urgent need after hours on the weekend please call your Primary Cardiologist or the [facility] in [place] at [phone].   At the [facility], you and your health needs are our priority. We have a designated team specialized in the treatment of Heart Failure. This Care Team includes your primary Heart Failure Specialized Cardiologist (physician), Advanced Practice Providers (APPs- Physician Assistants and Nurse Practitioners), and Pharmacist who all work together to provide you with the care you need, when you need it.   You may see any of the following providers on your designated Care Team at your next follow up:  Dr. Arvilla Meres Dr. Marca Ancona Dr. Dorthula Nettles Dr. Theresia Bough Tonye Becket, NP Robbie Lis, [place] [address] [place], [place] Brynda Peon, NP [place] Lee, NP Clarisa Kindred, NP Enos Fling, PharmD

## 2023-04-17 LAB — BASIC METABOLIC PANEL: EGFR: 48

## 2023-04-28 ENCOUNTER — Other Ambulatory Visit: Payer: Self-pay

## 2023-04-28 ENCOUNTER — Emergency Department
Admission: EM | Admit: 2023-04-28 | Discharge: 2023-04-28 | Disposition: A | Discharge status: Home / Self Care | Payer: 59 | Attending: Emergency Medicine | Admitting: Emergency Medicine

## 2023-04-28 ENCOUNTER — Emergency Department: Payer: 59

## 2023-04-28 DIAGNOSIS — K5792 Diverticulitis of intestine, part unspecified, without perforation or abscess without bleeding: Secondary | ICD-10-CM | POA: Diagnosis not present

## 2023-04-28 DIAGNOSIS — R1084 Generalized abdominal pain: Secondary | ICD-10-CM | POA: Diagnosis present

## 2023-04-28 DIAGNOSIS — N183 Chronic kidney disease, stage 3 unspecified: Secondary | ICD-10-CM | POA: Diagnosis not present

## 2023-04-28 DIAGNOSIS — I129 Hypertensive chronic kidney disease with stage 1 through stage 4 chronic kidney disease, or unspecified chronic kidney disease: Secondary | ICD-10-CM | POA: Diagnosis not present

## 2023-04-28 LAB — CBC
HCT: 38.4 % (ref 36.0–46.0)
Hemoglobin: 12 g/dL (ref 12.0–15.0)
MCH: 26 pg (ref 26.0–34.0)
MCHC: 31.3 g/dL (ref 30.0–36.0)
MCV: 83.1 fL (ref 80.0–100.0)
Platelets: 259 10*3/uL (ref 150–400)
RBC: 4.62 MIL/uL (ref 3.87–5.11)
RDW: 18.3 % — ABNORMAL HIGH (ref 11.5–15.5)
WBC: 4.5 10*3/uL (ref 4.0–10.5)
nRBC: 0.4 % — ABNORMAL HIGH (ref 0.0–0.2)

## 2023-04-28 LAB — COMPREHENSIVE METABOLIC PANEL
ALT: 20 U/L (ref 0–44)
AST: 17 U/L (ref 15–41)
Albumin: 3.6 g/dL (ref 3.5–5.0)
Alkaline Phosphatase: 105 U/L (ref 38–126)
Anion gap: 8 (ref 5–15)
BUN: 23 mg/dL — ABNORMAL HIGH (ref 6–20)
CO2: 26 mmol/L (ref 22–32)
Calcium: 8.8 mg/dL — ABNORMAL LOW (ref 8.9–10.3)
Chloride: 106 mmol/L (ref 98–111)
Creatinine, Ser: 1.29 mg/dL — ABNORMAL HIGH (ref 0.44–1.00)
GFR, Estimated: 49 mL/min — ABNORMAL LOW (ref >60)
Glucose, Bld: 86 mg/dL (ref 70–99)
Potassium: 4.1 mmol/L (ref 3.5–5.1)
Sodium: 140 mmol/L (ref 135–145)
Total Bilirubin: 0.5 mg/dL (ref <1.2)
Total Protein: 7 g/dL (ref 6.5–8.1)

## 2023-04-28 LAB — LIPASE, BLOOD: Lipase: 35 U/L (ref 11–51)

## 2023-04-28 MED ORDER — AMOXICILLIN-POT CLAVULANATE 875-125 MG PO TABS
1.0000 | ORAL_TABLET | Freq: Once | ORAL | Status: AC
  Administered 2023-04-28: 1 via ORAL
  Filled 2023-04-28: qty 1

## 2023-04-28 MED ORDER — IOHEXOL 350 MG/ML SOLN
100.0000 mL | Freq: Once | INTRAVENOUS | Status: AC | PRN
Start: 2023-04-28 — End: 2023-04-28
  Administered 2023-04-28: 100 mL via INTRAVENOUS

## 2023-04-28 MED ORDER — MORPHINE SULFATE (PF) 4 MG/ML IV SOLN
4.0000 mg | Freq: Once | INTRAVENOUS | Status: AC
  Administered 2023-04-28: 4 mg via INTRAVENOUS
  Filled 2023-04-28: qty 1

## 2023-04-28 MED ORDER — ONDANSETRON HCL 4 MG/2ML IJ SOLN
4.0000 mg | Freq: Once | INTRAMUSCULAR | Status: AC
  Administered 2023-04-28: 4 mg via INTRAVENOUS
  Filled 2023-04-28: qty 2

## 2023-04-28 MED ORDER — AMOXICILLIN-POT CLAVULANATE 875-125 MG PO TABS: 1.0000 | ORAL_TABLET | Freq: Two times a day (BID) | ORAL | 0 refills | Status: AC

## 2023-04-28 NOTE — ED Notes (Signed)
Patient currently waiting on ride.

## 2023-04-28 NOTE — ED Triage Notes (Signed)
Pt arrived via EMS from home due to abd pain times several days that is progressively getting worse. Pt sts that she has been having some nausea but not vomiting or diarrhea.

## 2023-04-28 NOTE — ED Provider Notes (Signed)
[facility] Provider Note    Event Date/Time   First MD Initiated Contact with Patient 04/28/23 1713     (approximate)   History   Abdominal Pain   HPI Sheila Hays is a 52 y.o. female with history of CKD stage III, HTN presenting today for abdominal pain.  Patient states over the past week she has had intermittent but worsening abdominal pain.  Initially was generalized but now more located on her right side.  She has had nausea but no vomiting.  Otherwise, normal bowel movements.  Denies fever, chest pain, shortness of breath, dysuria, hematuria.  No prior abdominal surgeries.     Physical Exam   Triage Vital Signs: ED Triage Vitals  Encounter Vitals Group     BP 04/28/23 1527 (!) 207/104     Systolic BP Percentile --      Diastolic BP Percentile --      Pulse Rate 04/28/23 1525 63     Resp 04/28/23 1525 18     Temp 04/28/23 1525 99.1 F (37.3 C)     Temp Source 04/28/23 1525 Oral     SpO2 04/28/23 1525 95 %     Weight 04/28/23 1526 240 lb (108.9 kg)     Height 04/28/23 1526 5\' 6"  (1.676 m)     Head Circumference --      Peak Flow --      Pain Score 04/28/23 1526 10     Pain Loc --      Pain Education --      Exclude from Growth Chart --     Most recent vital signs: Vitals:   04/28/23 1527 04/28/23 1758  BP: (!) 207/104 (!) 197/94  Pulse:  68  Resp:  18  Temp:    SpO2:  95%   Physical Exam: I have reviewed the vital signs and nursing notes. General: Awake, alert, no acute distress.  Nontoxic appearing. Head:  Atraumatic, normocephalic.   ENT:  EOM intact, PERRL. Oral mucosa is pink and moist with no lesions. Neck: Neck is supple with full range of motion, No meningeal signs. Cardiovascular:  RRR, No murmurs. Peripheral pulses palpable and equal bilaterally. Respiratory:  Symmetrical chest wall expansion.  No rhonchi, rales, or wheezes.  Good air movement throughout.  No use of accessory muscles.   Musculoskeletal:  No  cyanosis or edema. Moving extremities with full ROM Abdomen:  Soft, tender this palpation epigastric, right upper quadrant, right lower quadrant, nondistended. Neuro:  GCS 15, moving all four extremities, interacting appropriately. Speech clear. Psych:  Calm, appropriate.   Skin:  Warm, dry, no rash.     ED Results / Procedures / Treatments   Labs (all labs ordered are listed, but only abnormal results are displayed) Labs Reviewed  COMPREHENSIVE METABOLIC PANEL - Abnormal; Notable for the following components:      Result Value   BUN 23 (*)    Creatinine, Ser 1.29 (*)    Calcium 8.8 (*)    GFR, Estimated 49 (*)    All other components within normal limits  CBC - Abnormal; Notable for the following components:   RDW 18.3 (*)    nRBC 0.4 (*)    All other components within normal limits  LIPASE, BLOOD  URINALYSIS, ROUTINE W REFLEX MICROSCOPIC     EKG    RADIOLOGY Independently interpreted CT showing evidence of possible diverticulitis   PROCEDURES:  Critical Care performed: No  Procedures   MEDICATIONS ORDERED IN  ED: Medications  amoxicillin-clavulanate (AUGMENTIN) 875-125 MG per tablet 1 tablet (has no administration in time range)  morphine (PF) 4 MG/ML injection 4 mg (4 mg Intravenous Given 04/28/23 1737)  ondansetron (ZOFRAN) injection 4 mg (4 mg Intravenous Given 04/28/23 1736)  iohexol (OMNIPAQUE) 350 MG/ML injection 100 mL (100 mLs Intravenous Contrast Given 04/28/23 1755)     IMPRESSION / MDM / ASSESSMENT AND PLAN / ED COURSE  I reviewed the triage vital signs and the nursing notes.                              Differential diagnosis includes, but is not limited to, pancreatitis, cholecystitis, appendicitis, nephrolithiasis, pyelonephritis, enteritis  Patient's presentation is most consistent with acute complicated illness / injury requiring diagnostic workup.  Patient is a 52 year old female presenting today for abdominal pain x 1 week most prominent  in the right sided regions but also in the left lower quadrant.  Tender to palpation throughout the right side of the abdomen.  Will get laboratory workup and CT abdomen/pelvis to rule out intra-abdominal pathology.  Laboratory workup largely reassuring.  CT abdomen/pelvis shows evidence of diverticulitis.  Given stable vital signs and laboratory workup, will give first dose of Augmentin here and discharged with oral antibiotics.  Patient with improvement in symptoms after medications.  Stable for discharge and given strict return precautions.  The patient is on the cardiac monitor to evaluate for evidence of arrhythmia and/or significant heart rate changes. Clinical Course as of 04/28/23 1923  Tue Apr 28, 2023  1717 Comprehensive metabolic panel(!) Creatinine comparable to baseline [DW]  1918 CT ABDOMEN PELVIS W CONTRAST Diverticulosis of the sigmoid colon. Pericolonic stranding around the splenic flexure and upper descending region suggesting acute diverticulitis. [DW]  1922 Reassessed patient.  Feeling better at this time.  Will discharge.  Patient was briefly on oxygen after getting morphine due to somnolence but does have history of sleep apnea.  Not requiring oxygen while awake. [DW]    Clinical Course User Index [DW] Janith Lima, MD     FINAL CLINICAL IMPRESSION(S) / ED DIAGNOSES   Final diagnoses:  Diverticulitis     Rx / DC Orders   ED Discharge Orders          Ordered    amoxicillin-clavulanate (AUGMENTIN) 875-125 MG tablet  2 times daily        04/28/23 1923             Note:  This document was prepared using Dragon voice recognition software and may include unintentional dictation errors.   Janith Lima, MD 04/28/23 [phone]

## 2023-05-06 NOTE — Progress Notes (Unsigned)
PCP: Sumner Boast, MD (last seen 11/24) Primary Cardiologist: Terrilee Croak, PA (last seen 01/24)  HPI:  Sheila Hays is a 52 y/o female with a history of HTN, hyperlipidemia, OSA, anemia, CKD, GERD, seizures and chronic heart failure. She had previously underwent nuclear stress test in 2016 that was abnormal, notable for perfusion defect of the lateral inferior segment with an EF of 35%. Echo at that time demonstrated an EF of 25-30%, mildly to moderately dilated LV cavity size, mild to moderate concentric LVH, mild to moderate MR, mild TR, PASP 55-60 mmHg, and a trivial pericardial effusion. Repeat echo in 2018 demonstrated a low normal LVSF with an EF of 50-55%, normal wall motion.  She was admitted 6/28-7/14/23 for shortness of breath and chest pain. BNP ws 500s. CXR with b/l pleural effusions and concern for sepsis. She was admitted for diuresis with IV lasix and hypertensive urgency. RHC 11/25/21 showed severe pulmonary HTN. She had a fall in the bathroom with negative work-up. She was moved to the ICU for milrinone drip and PICC line. Sildenafil and BB were held for hypotension. Required norepinephrine. She was eventually transitioned to torsmide 40mg  daily.    Admitted 06/02/22 due to SOB/ cough due to bronchitis due to influenza.   Echo 11/30/20: EF 55-60% without LVH. Echo 11/21/21: EF 55-60% along with mild LVH and mild/moderate LAE Echo 06/03/22: EF 50-55% along with moderate LVH and mild LAE/RAE.     RHC done 11/25/21 and showed: Hemodynamic findings consistent with severe pulmonary hypertension.  Successful right heart catheterization via the right antecubital vein. This showed evidence of severely elevated right and left-sided filling pressures, severe pulmonary hypertension and normal cardiac output.  RA: 22 mmHg RV: 92/18 with an end-diastolic pressure of 32 mmHg. PA: 95/44 with a mean of 64 mmHg PCW: 30 mmHg  Cardiac output: 7.4 with an index of 3.16. Pulmonary vascular  resistance: 4.5 Woods units  She presents today for a HF follow-up visit with a chief complaint of minimal fatigue upon moderate exertion. Chronic in nature. Has had a decreased appetite due to diverticulitis flare. Denies shortness of breath, chest pain, cough, palpitations, abdominal distention, pedal edema, dizziness or difficulty sleeping. Has been walking on a treadmill 5 days/ week for 20-25 minutes at a time along with lifting some mild weights.   At last HF visit, entresto was increased to 49/51mg  BID & she doesn't notice any issues with taking this dose. Had blood work drawn earlier this week at her PCP office.   ROS: All systems negative except as listed in HPI, PMH and Problem List.  SH:  Social History   Socioeconomic History   Marital status: Single    Spouse name: Not on file   Number of children: Not on file   Years of education: Not on file   Highest education level: Not on file  Occupational History   Not on file  Tobacco Use   Smoking status: Former    Current packs/day: 0.00    Average packs/day: 0.5 packs/day for 7.0 years (3.5 ttl pk-yrs)    Types: Cigarettes    Start date: 2011    Quit date: 2018    Years since quitting: 6.9   Smokeless tobacco: Former    Quit date: 03/07/2007  Vaping Use   Vaping status: Never Used  Substance and Sexual Activity   Alcohol use: Not Currently   Drug use: Never   Sexual activity: Not Currently  Other Topics Concern   Not on file  Social History Narrative   Not on file   Social Determinants of Health   Financial Resource Strain: Not on file  Food Insecurity: No Food Insecurity (06/03/2022)   Hunger Vital Sign    Worried About Running Out of Food in the Last Year: Never true    Ran Out of Food in the Last Year: Never true  Transportation Needs: No Transportation Needs (06/03/2022)   PRAPARE - Administrator, Civil Service (Medical): No    Lack of Transportation (Non-Medical): No  Physical Activity: Not on  file  Stress: Not on file  Social Connections: Not on file  Intimate Partner Violence: Not At Risk (06/03/2022)   Humiliation, Afraid, Rape, and Kick questionnaire    Fear of Current or Ex-Partner: No    Emotionally Abused: No    Physically Abused: No    Sexually Abused: No    FH:  Family History  Problem Relation Age of Onset   Hypertension Mother    Hypertension Father    Heart failure Sister    Diabetes Sister    Seizures Sister    Breast cancer Neg Hx     Past Medical History:  Diagnosis Date   CHF (congestive heart failure) (HCC)    Chronic kidney disease (CKD), stage III (moderate) (HCC)    GERD (gastroesophageal reflux disease)    Hypertension    Seizures (HCC)    Followed at [facility]    Current Outpatient Medications  Medication Sig Dispense Refill   acetaminophen (TYLENOL) 500 MG tablet Take 500 mg by mouth every 8 (eight) hours as needed for mild pain.     Albuterol-Budesonide (AIRSUPRA) 90-80 MCG/ACT AERO Inhale 2 puffs into the lungs every 4 (four) hours as needed. 10.7 g 0   aspirin EC 81 MG tablet Take 81 mg by mouth daily.     budesonide-formoterol (SYMBICORT) 80-4.5 MCG/ACT inhaler Take 2 puffs first thing in am and then another 2 puffs about 12 hours later. 1 each 12   carvedilol (COREG) 25 MG tablet Take 1 tablet (25 mg total) by mouth 2 (two) times daily. 60 tablet 3   hydrALAZINE (APRESOLINE) 25 MG tablet Take 1 tablet (25 mg total) by mouth 3 (three) times daily. (Patient taking differently: Take 100 mg by mouth 3 (three) times daily.) 90 tablet 1   ipratropium-albuterol (DUONEB) 0.5-2.5 (3) MG/3ML SOLN Take 3 mLs by nebulization every 6 (six) hours as needed.     LINZESS 145 MCG CAPS capsule Take 145 mcg by mouth daily.     montelukast (SINGULAIR) 10 MG tablet Take 10 mg by mouth daily.     oxcarbazepine (TRILEPTAL) 600 MG tablet Take 600 mg by mouth 2 (two) times daily.     rosuvastatin (CRESTOR) 20 MG tablet Take 20 mg by mouth at bedtime.      sacubitril-valsartan (ENTRESTO) 49-51 MG Take 1 tablet by mouth 2 (two) times daily. 60 tablet 5   spironolactone (ALDACTONE) 25 MG tablet Take 0.5 tablets (12.5 mg total) by mouth daily. 45 tablet 3   torsemide (DEMADEX) 20 MG tablet Take 1 tablet (20 mg total) by mouth daily. 30 tablet 1   traZODone (DESYREL) 100 MG tablet Take 100 mg by mouth at bedtime as needed for sleep.     vitamin C (ASCORBIC ACID) 500 MG tablet Take 500 mg by mouth daily.     No current facility-administered medications for this visit.   There were no vitals filed for this visit.  Wt Readings  from Last 3 Encounters:  04/28/23 240 lb (108.9 kg)  04/03/23 241 lb (109.3 kg)  02/24/23 251 lb 9.6 oz (114.1 kg)   Lab Results  Component Value Date   CREATININE 1.29 (H) 04/28/2023   CREATININE 1.49 (H) 07/18/2022   CREATININE 1.73 (H) 06/07/2022   PHYSICAL EXAM:  General:  Well appearing. No resp difficulty HEENT: normal Neck: supple. JVP flat. No lymphadenopathy or thryomegaly appreciated. Cor: PMI normal. Regular rate & rhythm. No rubs, gallops or murmurs. Lungs: clear Abdomen: soft, nontender, nondistended. No hepatosplenomegaly. No bruits or masses.  Extremities: no cyanosis, clubbing, rash, trace pitting edema bilateral lower legs Neuro: alert & orientedx3, cranial nerves grossly intact. Moves all 4 extremities w/o difficulty. Affect pleasant.   ECG: not done   ASSESSMENT & PLAN:  1: NICM with preserved ejection fraction- - suspect due to uncontrolled HTN - NYHA class II - euvolemic today - weighing daily; reminded to call for an overnight weight gain of > 2 pounds or a weekly weight gain of > 5 pounds - weight down 10 pounds from last visit here 5 weeks ago - Echo 11/30/20: EF 55-60% without LVH. - Echo 11/21/21: EF 55-60% along with mild LVH and mild/moderate LAE - Echo 06/03/22: EF 50-55% along with moderate LVH and mild LAE/RAE.  - not adding salt; reviewed the importance of reading food labels to  keep her daily sodium intake to 2000mg  / day - trying to increase her activity and now walks on the treadmill 5 days/ week for 20-25 minutes at a time - saw cardiology Fransico Michael) 01/24 - continue carvedilol 25mg  BID - continue hydralazine 100mg  TID - continue entresto 49/51 mg BID - continue torsemide 20mg  daily - begin spironolactone 12.5mg  daily - BMET in 7-10 days @ PCP office - BMET here next time - BNP 06/05/22 was 149.6  2: HTN- - BP 175/86 and rechecked was 158/69 - says that home BP runs 130's/60's - saw PCP at [facility] 11/24 - BMP 03/30/23 (PCP) reviewed and showed sodium 141, potassium 4.7, creatinine 1.59 and GFR 38 - saw nephrology (Korrapati) 02/24  3: OSA- - has had previous sleep study  4: Pulmonary HTN- - RHC done 11/25/21:  Hemodynamic findings consistent with severe pulmonary hypertension.  Successful right heart catheterization via the right antecubital vein. This showed evidence of severely elevated right and left-sided filling pressures, severe pulmonary hypertension and normal cardiac output.  RA: 22 mmHg RV: 92/18 with an end-diastolic pressure of 32 mmHg. PA: 95/44 with a mean of 64 mmHg PCW: 30 mmHg  Cardiac output: 7.4 with an index of 3.16. Pulmonary vascular resistance: 4.5 Woods units  Return in 1 month, sooner if needed.

## 2023-05-08 ENCOUNTER — Encounter: Payer: 59 | Admitting: Family

## 2023-06-24 ENCOUNTER — Telehealth: Payer: Self-pay

## 2023-06-24 NOTE — Telephone Encounter (Signed)
Patient has a scheduled appointment  with Dr. Allegra Lai on 07/02/2023 but is a establish patient with [facility] and at this time we are not accepting transfers. I am going to have to cancel the appointment at this time. Tried to call patient to inform patient but voicemail is full. Sent patient a [facility] geologist

## 2023-07-02 ENCOUNTER — Ambulatory Visit: Payer: 59 | Admitting: Gastroenterology

## 2023-07-10 ENCOUNTER — Ambulatory Visit
Admission: RE | Admit: 2023-07-10 | Discharge: 2023-07-10 | Disposition: A | Discharge status: Home / Self Care | Payer: 59 | Source: Ambulatory Visit | Attending: Internal Medicine | Admitting: Internal Medicine

## 2023-07-10 ENCOUNTER — Other Ambulatory Visit: Payer: Self-pay | Admitting: Internal Medicine

## 2023-07-10 ENCOUNTER — Ambulatory Visit
Admission: RE | Admit: 2023-07-10 | Discharge: 2023-07-10 | Disposition: A | Discharge status: Home / Self Care | Payer: 59 | Attending: Internal Medicine | Admitting: Internal Medicine

## 2023-07-10 DIAGNOSIS — R051 Acute cough: Secondary | ICD-10-CM

## 2023-07-10 IMAGING — DX DG CHEST 2V
2 series · 2 of 2 positions shown · non-contrast
Comparison: August 29, 2021.

CLINICAL DATA: Cough, CHF and shortness of breath.

EXAM:
CHEST - 2 VIEW

[w chest pa]
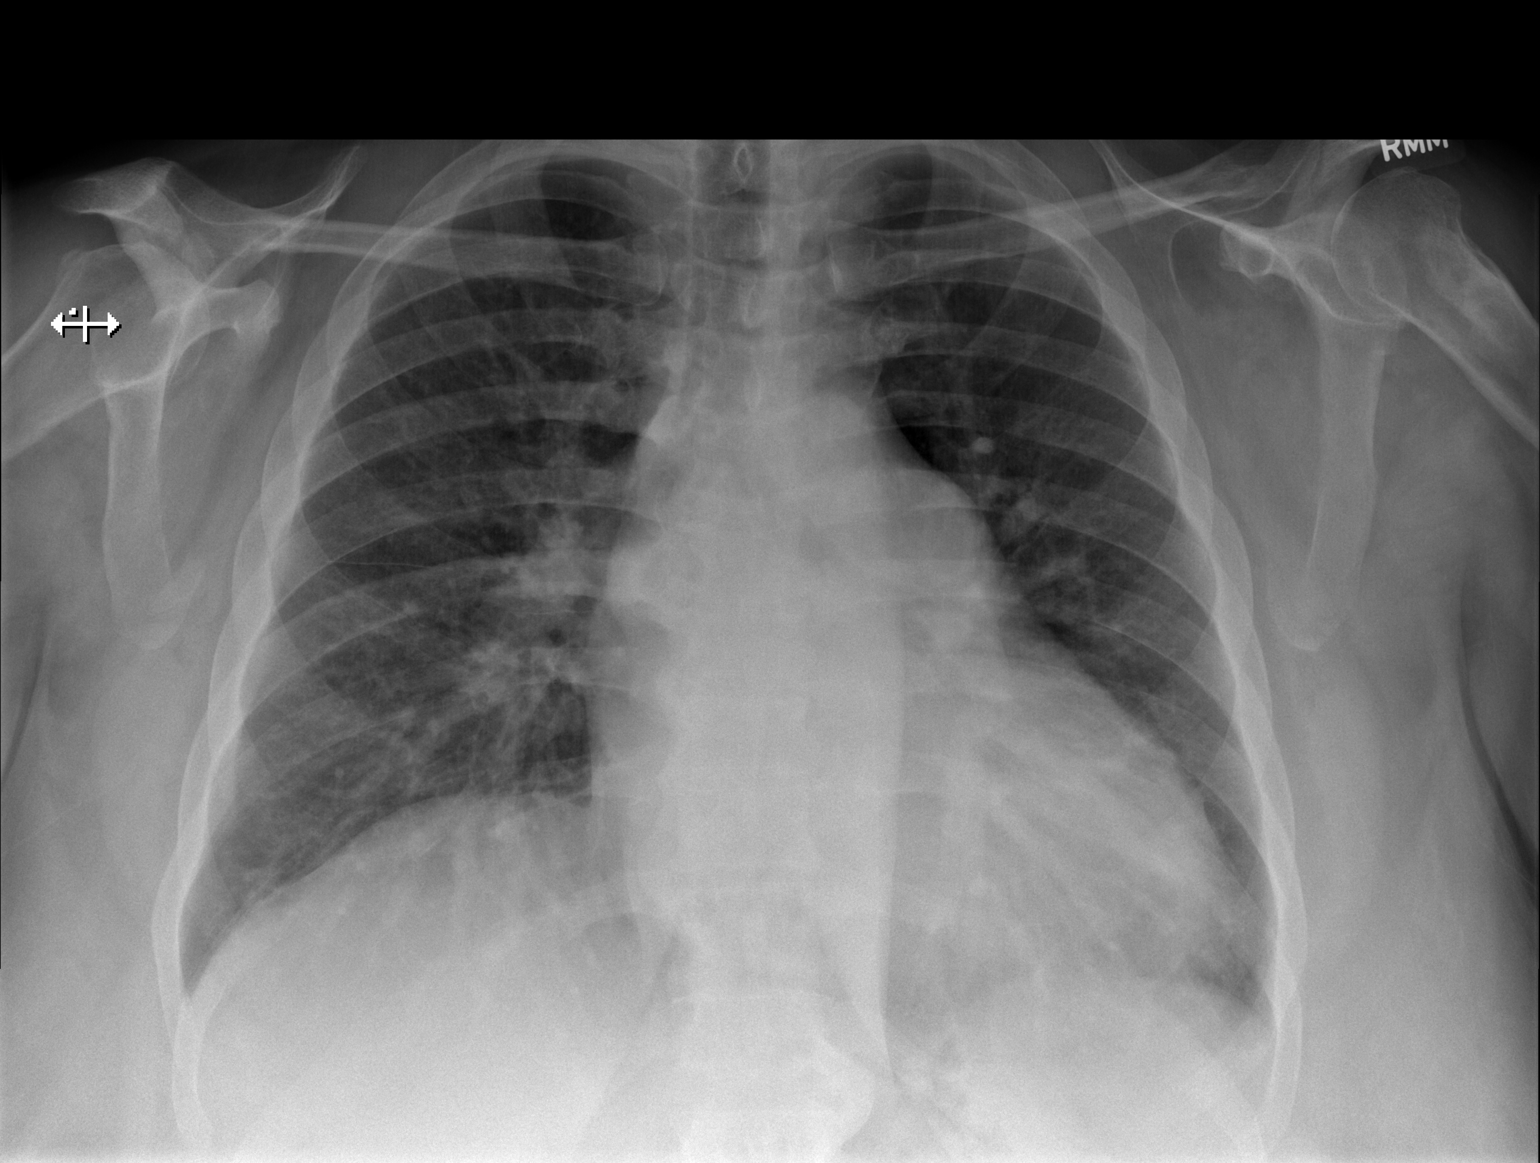

[w chest lat]
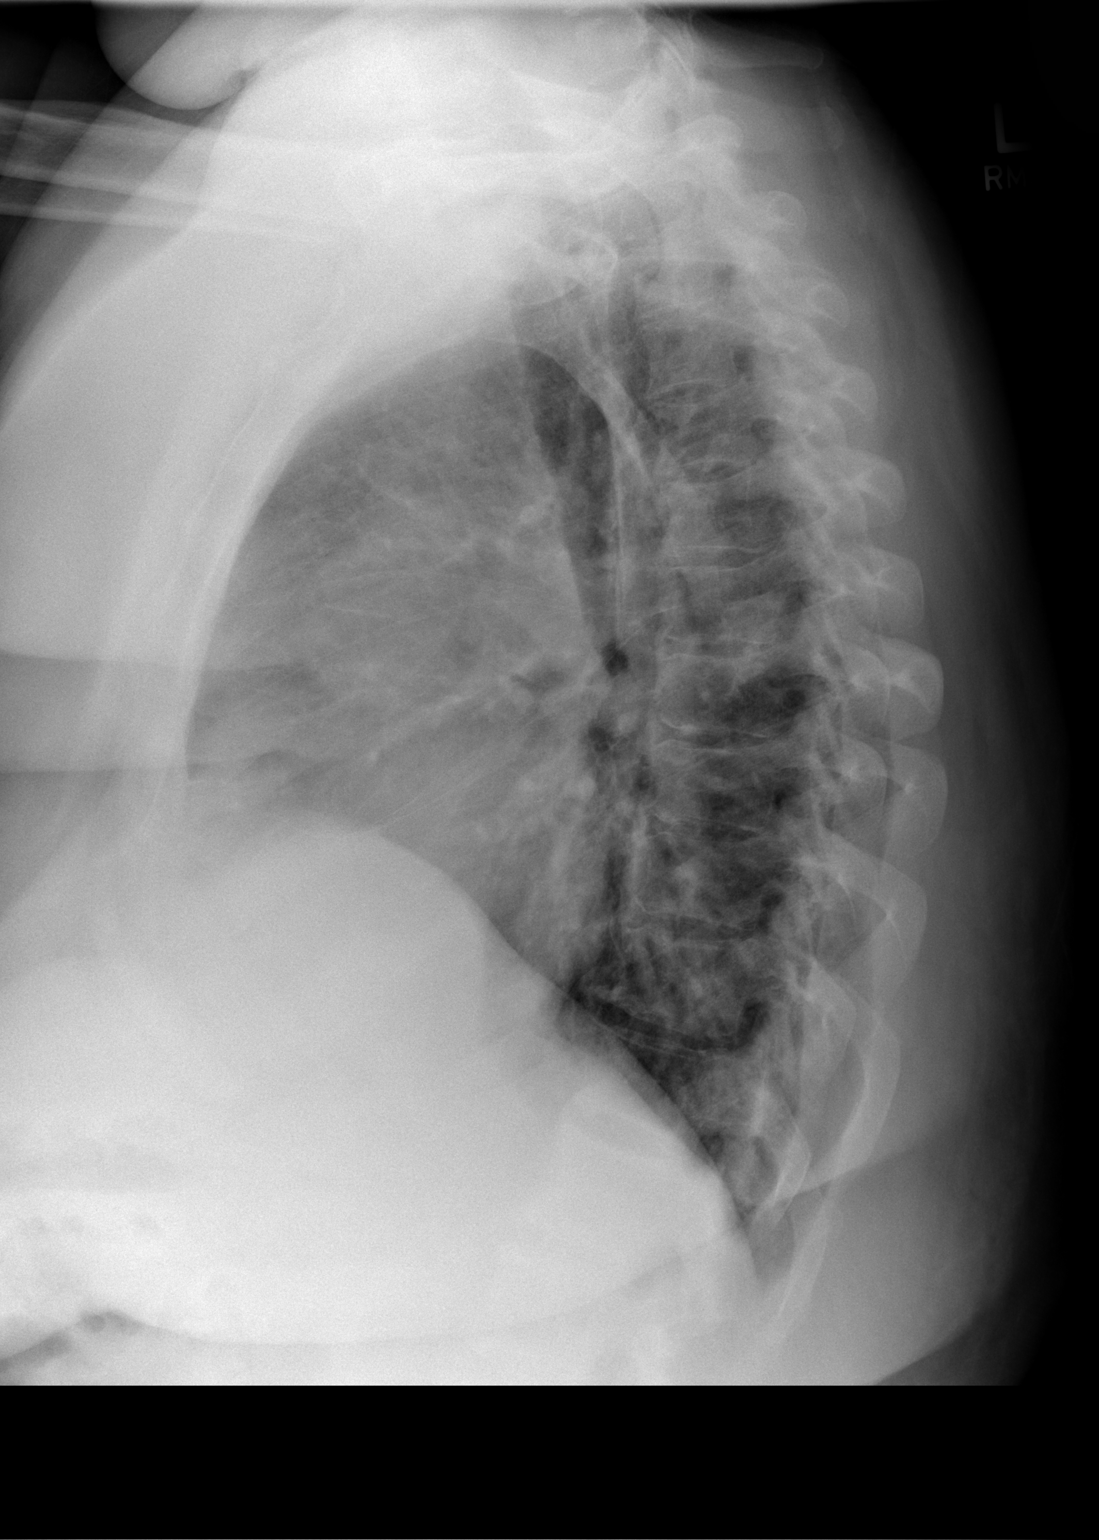

[2 of 2 positions shown; findings below may reference images not displayed]

FINDINGS: Trachea midline.

Enlargement of main pulmonary artery exhibited by convex contour of
the AP window as seen on prior imaging studies. Heart size remains
enlarged with central pulmonary vascular engorgement. No sign of
pneumothorax, consolidation or evidence of pleural effusion.

On limited assessment there is no acute skeletal process.
IMPRESSION: 1. Cardiomegaly with central pulmonary vascular engorgement. No
pleural effusion or consolidation.
2. Enlargement of main pulmonary artery, correlate with signs of
pulmonary arterial hypertension.

## 2023-07-17 ENCOUNTER — Telehealth: Payer: Self-pay | Admitting: Family

## 2023-07-17 NOTE — Progress Notes (Deleted)
 Advanced Heart Failure Clinic Note    PCP: Sumner Boast, MD (last seen 11/24) Primary Cardiologist: Terrilee Croak, PA (last seen 01/24)  Chief Complaint:   HPI:  Sheila Hays is a 53 y/o female with a history of HTN, hyperlipidemia, OSA, anemia, CKD, GERD, seizures and chronic heart failure. She had previously underwent nuclear stress test in 2016 that was abnormal, notable for perfusion defect of the lateral inferior segment with an EF of 35%. Echo at that time demonstrated an EF of 25-30%, mildly to moderately dilated LV cavity size, mild to moderate concentric LVH, mild to moderate MR, mild TR, PASP 55-60 mmHg, and a trivial pericardial effusion. Repeat echo in 2018 demonstrated a low normal LVSF with an EF of 50-55%, normal wall motion.  She was admitted 6/28-7/14/23 for shortness of breath and chest pain. BNP ws 500s. CXR with b/l pleural effusions and concern for sepsis. She was admitted for diuresis with IV lasix and hypertensive urgency. RHC 11/25/21 showed severe pulmonary HTN. She had a fall in the bathroom with negative work-up. She was moved to the ICU for milrinone drip and PICC line. Sildenafil and BB were held for hypotension. Required norepinephrine. She was eventually transitioned to torsmide 40mg  daily.    Admitted 06/02/22 due to SOB/ cough due to bronchitis due to influenza.   Echo 11/30/20: EF 55-60% without LVH. Echo 11/21/21: EF 55-60% along with mild LVH and mild/moderate LAE Echo 06/03/22: EF 50-55% along with moderate LVH and mild LAE/RAE.     RHC done 11/25/21 and showed: Hemodynamic findings consistent with severe pulmonary hypertension.  Successful right heart catheterization via the right antecubital vein. This showed evidence of severely elevated right and left-sided filling pressures, severe pulmonary hypertension and normal cardiac output.  RA: 22 mmHg RV: 92/18 with an end-diastolic pressure of 32 mmHg. PA: 95/44 with a mean of 64 mmHg PCW: 30 mmHg  Cardiac  output: 7.4 with an index of 3.16. Pulmonary vascular resistance: 4.5 Woods units  She presents today for a HF follow-up visit with a chief complaint of   At last visit, spironolactone 12.5mg  daily was started.   ROS: All systems negative except as listed in HPI, PMH and Problem List.  SH:  Social History   Socioeconomic History   Marital status: Single    Spouse name: Not on file   Number of children: Not on file   Years of education: Not on file   Highest education level: Not on file  Occupational History   Not on file  Tobacco Use   Smoking status: Former    Current packs/day: 0.00    Average packs/day: 0.5 packs/day for 7.0 years (3.5 ttl pk-yrs)    Types: Cigarettes    Start date: 2011    Quit date: 2018    Years since quitting: 7.1   Smokeless tobacco: Former    Quit date: 03/07/2007  Vaping Use   Vaping status: Never Used  Substance and Sexual Activity   Alcohol use: Not Currently   Drug use: Never   Sexual activity: Not Currently  Other Topics Concern   Not on file  Social History Narrative   Not on file   Social Drivers of Health   Financial Resource Strain: Not on file  Food Insecurity: No Food Insecurity (06/03/2022)   Hunger Vital Sign    Worried About Running Out of Food in the Last Year: Never true    Ran Out of Food in the Last Year: Never true  Transportation Needs:  No Transportation Needs (06/03/2022)   PRAPARE - Administrator, Civil Service (Medical): No    Lack of Transportation (Non-Medical): No  Physical Activity: Not on file  Stress: Not on file  Social Connections: Not on file  Intimate Partner Violence: Not At Risk (06/03/2022)   Humiliation, Afraid, Rape, and Kick questionnaire    Fear of Current or Ex-Partner: No    Emotionally Abused: No    Physically Abused: No    Sexually Abused: No    FH:  Family History  Problem Relation Age of Onset   Hypertension Mother    Hypertension Father    Heart failure Sister     Diabetes Sister    Seizures Sister    Breast cancer Neg Hx     Past Medical History:  Diagnosis Date   CHF (congestive heart failure) (HCC)    Chronic kidney disease (CKD), stage III (moderate) (HCC)    GERD (gastroesophageal reflux disease)    Hypertension    Seizures (HCC)    Followed at [facility]    Current Outpatient Medications  Medication Sig Dispense Refill   acetaminophen (TYLENOL) 500 MG tablet Take 500 mg by mouth every 8 (eight) hours as needed for mild pain.     Albuterol-Budesonide (AIRSUPRA) 90-80 MCG/ACT AERO Inhale 2 puffs into the lungs every 4 (four) hours as needed. 10.7 g 0   aspirin EC 81 MG tablet Take 81 mg by mouth daily.     budesonide-formoterol (SYMBICORT) 80-4.5 MCG/ACT inhaler Take 2 puffs first thing in am and then another 2 puffs about 12 hours later. 1 each 12   carvedilol (COREG) 25 MG tablet Take 1 tablet (25 mg total) by mouth 2 (two) times daily. 60 tablet 3   hydrALAZINE (APRESOLINE) 25 MG tablet Take 1 tablet (25 mg total) by mouth 3 (three) times daily. (Patient taking differently: Take 100 mg by mouth 3 (three) times daily.) 90 tablet 1   ipratropium-albuterol (DUONEB) 0.5-2.5 (3) MG/3ML SOLN Take 3 mLs by nebulization every 6 (six) hours as needed.     LINZESS 145 MCG CAPS capsule Take 145 mcg by mouth daily.     montelukast (SINGULAIR) 10 MG tablet Take 10 mg by mouth daily.     oxcarbazepine (TRILEPTAL) 600 MG tablet Take 600 mg by mouth 2 (two) times daily.     rosuvastatin (CRESTOR) 20 MG tablet Take 20 mg by mouth at bedtime.     sacubitril-valsartan (ENTRESTO) 49-51 MG Take 1 tablet by mouth 2 (two) times daily. 60 tablet 5   spironolactone (ALDACTONE) 25 MG tablet Take 0.5 tablets (12.5 mg total) by mouth daily. 45 tablet 3   torsemide (DEMADEX) 20 MG tablet Take 1 tablet (20 mg total) by mouth daily. 30 tablet 1   traZODone (DESYREL) 100 MG tablet Take 100 mg by mouth at bedtime as needed for sleep.     vitamin C (ASCORBIC ACID) 500 MG tablet  Take 500 mg by mouth daily.     No current facility-administered medications for this visit.     PHYSICAL EXAM:  General:  Well appearing. No resp difficulty HEENT: normal Neck: supple. JVP flat. No lymphadenopathy or thryomegaly appreciated. Cor: PMI normal. Regular rate & rhythm. No rubs, gallops or murmurs. Lungs: clear Abdomen: soft, nontender, nondistended. No hepatosplenomegaly. No bruits or masses.  Extremities: no cyanosis, clubbing, rash, trace pitting edema bilateral lower legs Neuro: alert & orientedx3, cranial nerves grossly intact. Moves all 4 extremities w/o difficulty. Affect pleasant.  ECG: not done   ASSESSMENT & PLAN:  1: NICM with preserved ejection fraction- - suspect due to uncontrolled HTN - NYHA class II - euvolemic today - weighing daily; reminded to call for an overnight weight gain of > 2 pounds or a weekly weight gain of > 5 pounds - weight 241 pounds from last visit here 3 months ago - Echo 11/30/20: EF 55-60% without LVH. - Echo 11/21/21: EF 55-60% along with mild LVH and mild/moderate LAE - Echo 06/03/22: EF 50-55% along with moderate LVH and mild LAE/RAE.  - not adding salt; reviewed the importance of reading food labels to keep her daily sodium intake to 2000mg  / day - trying to increase her activity and now walks on the treadmill 5 days/ week for 20-25 minutes at a time - saw cardiology Fransico Michael) 01/24 - continue carvedilol 25mg  BID - continue hydralazine 100mg  TID - continue entresto 49/51 mg BID - continue torsemide 20mg  daily - continue spironolactone 12.5mg  daily - BMET today - BNP 06/05/22 was 149.6  2: HTN- - BP  - saw PCP at [facility] 11/24 - BMP 04/28/23 reviewed and showed sodium 140, potassium 4.1, creatinine 1.29 and GFR 49 - saw nephrology (Korrapati) 11/24  3: OSA- - has had previous sleep study  4: Pulmonary HTN- - RHC done 11/25/21:  Hemodynamic findings consistent with severe pulmonary  hypertension.  Successful right heart catheterization via the right antecubital vein. This showed evidence of severely elevated right and left-sided filling pressures, severe pulmonary hypertension and normal cardiac output.  RA: 22 mmHg RV: 92/18 with an end-diastolic pressure of 32 mmHg. PA: 95/44 with a mean of 64 mmHg PCW: 30 mmHg  Cardiac output: 7.4 with an index of 3.16. Pulmonary vascular resistance: 4.5 Woods units    Delma Freeze, [place] 07/17/23

## 2023-07-17 NOTE — Telephone Encounter (Signed)
Pt confirmed appt for 07/20/23

## 2023-07-20 ENCOUNTER — Encounter: Payer: 59 | Admitting: Family

## 2023-07-27 ENCOUNTER — Telehealth: Payer: Self-pay | Admitting: Family

## 2023-07-27 NOTE — Telephone Encounter (Signed)
 Unable to confirm appt pt vm is full

## 2023-07-28 ENCOUNTER — Ambulatory Visit: Payer: 59 | Attending: Family | Admitting: Family

## 2023-07-28 ENCOUNTER — Encounter: Payer: Self-pay | Admitting: Family

## 2023-07-28 VITALS — BP 138/82 | HR 64 | Wt 243.0 lb

## 2023-07-28 DIAGNOSIS — K219 Gastro-esophageal reflux disease without esophagitis: Secondary | ICD-10-CM | POA: Diagnosis not present

## 2023-07-28 DIAGNOSIS — G40909 Epilepsy, unspecified, not intractable, without status epilepticus: Secondary | ICD-10-CM | POA: Diagnosis not present

## 2023-07-28 DIAGNOSIS — I272 Pulmonary hypertension, unspecified: Secondary | ICD-10-CM | POA: Insufficient documentation

## 2023-07-28 DIAGNOSIS — N183 Chronic kidney disease, stage 3 unspecified: Secondary | ICD-10-CM | POA: Insufficient documentation

## 2023-07-28 DIAGNOSIS — E785 Hyperlipidemia, unspecified: Secondary | ICD-10-CM | POA: Insufficient documentation

## 2023-07-28 DIAGNOSIS — I130 Hypertensive heart and chronic kidney disease with heart failure and stage 1 through stage 4 chronic kidney disease, or unspecified chronic kidney disease: Secondary | ICD-10-CM | POA: Diagnosis present

## 2023-07-28 DIAGNOSIS — G4733 Obstructive sleep apnea (adult) (pediatric): Secondary | ICD-10-CM | POA: Diagnosis not present

## 2023-07-28 DIAGNOSIS — D631 Anemia in chronic kidney disease: Secondary | ICD-10-CM | POA: Diagnosis not present

## 2023-07-28 DIAGNOSIS — I5032 Chronic diastolic (congestive) heart failure: Secondary | ICD-10-CM | POA: Diagnosis not present

## 2023-07-28 DIAGNOSIS — I428 Other cardiomyopathies: Secondary | ICD-10-CM | POA: Diagnosis not present

## 2023-07-28 DIAGNOSIS — Z79899 Other long term (current) drug therapy: Secondary | ICD-10-CM | POA: Diagnosis not present

## 2023-07-28 DIAGNOSIS — I10 Essential (primary) hypertension: Secondary | ICD-10-CM

## 2023-07-28 NOTE — Patient Instructions (Signed)
 Medication Changes:  No medication changes   Lab Work:  Go DOWN to LOWER LEVEL (LL) to have your blood work completed inside of [employer] office.  We will only call you if the results are abnormal or if the provider would like to make medication changes.   Special Instructions // Education:  Please follow up with your primary care physician to go over your sleep study results. Dr. Glynda Jaeger [place] [address] [phone]  Follow-Up in: 1 month with Clarisa Kindred, FNP  At the [facility], you and your health needs are our priority. We have a designated team specialized in the treatment of Heart Failure. This Care Team includes your primary Heart Failure Specialized Cardiologist (physician), Advanced Practice Providers (APPs- Physician Assistants and Nurse Practitioners), and Pharmacist who all work together to provide you with the care you need, when you need it.   You may see any of the following providers on your designated Care Team at your next follow up:  Dr. Arvilla Meres Dr. Marca Ancona Dr. Dorthula Nettles Dr. Theresia Bough Clarisa Kindred, FNP Enos Fling, RPH-CPP  Please be sure to bring in all your medications bottles to every appointment.   Need to Contact us:  If you have any questions or concerns before your next appointment please send [place] a message through [place] or call our office at [phone].    TO LEAVE A MESSAGE FOR THE NURSE SELECT OPTION 2, PLEASE LEAVE A MESSAGE INCLUDING: YOUR NAME DATE OF BIRTH CALL BACK NUMBER REASON FOR CALL**this is important as we prioritize the call backs  YOU WILL RECEIVE A CALL BACK THE SAME DAY AS LONG AS YOU CALL BEFORE 4:00 PM

## 2023-07-28 NOTE — Progress Notes (Signed)
 [facility] Note    PCP: Sumner Boast, MD (last seen 11/24) Primary Cardiologist: Fransico Michael, [place], PA (last seen 01/24)  Chief Complaint: shortness of breath  HPI:  Sheila Hays is a 53 y/o female with a history of HTN, hyperlipidemia, OSA, anemia, CKD, GERD, seizures and chronic heart failure. She had previously underwent nuclear stress test in 2016 that was abnormal, notable for perfusion defect of the lateral inferior segment with an EF of 35%. Echo at that time demonstrated an EF of 25-30%, mildly to moderately dilated LV cavity size, mild to moderate concentric LVH, mild to moderate MR, mild TR, PASP 55-60 mmHg, and a trivial pericardial effusion. Repeat echo in 2018 demonstrated a low normal LVSF with an EF of 50-55%, normal wall motion.  She was admitted 6/28-7/14/23 for shortness of breath and chest pain. BNP ws 500s. CXR with b/l pleural effusions and concern for sepsis. She was admitted for diuresis with IV lasix and hypertensive urgency. RHC 11/25/21 showed severe pulmonary HTN. She had a fall in the bathroom with negative work-up. She was moved to the ICU for milrinone drip and PICC line. Sildenafil and BB were held for hypotension. Required norepinephrine. She was eventually transitioned to torsmide 40mg  daily.    Admitted 06/02/22 due to SOB/ cough due to bronchitis due to influenza.   Echo 11/30/20: EF 55-60% without LVH. Echo 11/21/21: EF 55-60% along with mild LVH and mild/moderate LAE Echo 06/03/22: EF 50-55% along with moderate LVH and mild LAE/RAE.     RHC done 11/25/21 and showed: Hemodynamic findings consistent with severe pulmonary hypertension.  Successful right heart catheterization via the right antecubital vein. This showed evidence of severely elevated right and left-sided filling pressures, severe pulmonary hypertension and normal cardiac output.  RA: 22 mmHg RV: 92/18 with an end-diastolic pressure of 32 mmHg. PA: 95/44 with a mean of 64 mmHg PCW:  30 mmHg  Cardiac output: 7.4 with an index of 3.16. Pulmonary vascular resistance: 4.5 Woods units  She presents today for a HF follow-up visit with a chief complaint of minimal shortness of breath with exertion. Has associated fatigue, occasional palpitations and chronic difficulty sleeping along with this. Denies chest pain, cough, abdominal distention, pedal edema, dizziness or weight gain. Has been walking daily and going to the gym every other day.  At last visit, spironolactone 12.5mg  daily was started. No issues with it that she is aware of.   Had a sleep study done by her PCP 08/24 but says that she hasn't heard anything about the results.   ROS: All systems negative except as listed in HPI, PMH and Problem List.  SH:  Social History   Socioeconomic History   Marital status: Single    Spouse name: Not on file   Number of children: Not on file   Years of education: Not on file   Highest education level: Not on file  Occupational History   Not on file  Tobacco Use   Smoking status: Former    Current packs/day: 0.00    Average packs/day: 0.5 packs/day for 7.0 years (3.5 ttl pk-yrs)    Types: Cigarettes    Start date: 2011    Quit date: 2018    Years since quitting: 7.1   Smokeless tobacco: Former    Quit date: 03/07/2007  Vaping Use   Vaping status: Never Used  Substance and Sexual Activity   Alcohol use: Not Currently   Drug use: Never   Sexual activity: Not Currently  Other  Topics Concern   Not on file  Social History Narrative   Not on file   Social Drivers of Health   Financial Resource Strain: Not on file  Food Insecurity: No Food Insecurity (06/03/2022)   Hunger Vital Sign    Worried About Running Out of Food in the Last Year: Never true    Ran Out of Food in the Last Year: Never true  Transportation Needs: No Transportation Needs (06/03/2022)   PRAPARE - Administrator, Civil Service (Medical): No    Lack of Transportation (Non-Medical): No   Physical Activity: Not on file  Stress: Not on file  Social Connections: Not on file  Intimate Partner Violence: Not At Risk (06/03/2022)   Humiliation, Afraid, Rape, and Kick questionnaire    Fear of Current or Ex-Partner: No    Emotionally Abused: No    Physically Abused: No    Sexually Abused: No    FH:  Family History  Problem Relation Age of Onset   Hypertension Mother    Hypertension Father    Heart failure Sister    Diabetes Sister    Seizures Sister    Breast cancer Neg Hx     Past Medical History:  Diagnosis Date   CHF (congestive heart failure) (HCC)    Chronic kidney disease (CKD), stage III (moderate) (HCC)    GERD (gastroesophageal reflux disease)    Hypertension    Seizures (HCC)    Followed at [facility]    Current Outpatient Medications  Medication Sig Dispense Refill   acetaminophen (TYLENOL) 500 MG tablet Take 500 mg by mouth every 8 (eight) hours as needed for mild pain.     Albuterol-Budesonide (AIRSUPRA) 90-80 MCG/ACT AERO Inhale 2 puffs into the lungs every 4 (four) hours as needed. 10.7 g 0   aspirin EC 81 MG tablet Take 81 mg by mouth daily.     budesonide-formoterol (SYMBICORT) 80-4.5 MCG/ACT inhaler Take 2 puffs first thing in am and then another 2 puffs about 12 hours later. 1 each 12   carvedilol (COREG) 25 MG tablet Take 1 tablet (25 mg total) by mouth 2 (two) times daily. 60 tablet 3   hydrALAZINE (APRESOLINE) 25 MG tablet Take 1 tablet (25 mg total) by mouth 3 (three) times daily. (Patient taking differently: Take 100 mg by mouth 3 (three) times daily.) 90 tablet 1   ipratropium-albuterol (DUONEB) 0.5-2.5 (3) MG/3ML SOLN Take 3 mLs by nebulization every 6 (six) hours as needed.     LINZESS 145 MCG CAPS capsule Take 145 mcg by mouth daily.     montelukast (SINGULAIR) 10 MG tablet Take 10 mg by mouth daily.     oxcarbazepine (TRILEPTAL) 600 MG tablet Take 600 mg by mouth 2 (two) times daily.     rosuvastatin (CRESTOR) 20 MG tablet Take 20 mg by mouth  at bedtime.     sacubitril-valsartan (ENTRESTO) 49-51 MG Take 1 tablet by mouth 2 (two) times daily. 60 tablet 5   torsemide (DEMADEX) 20 MG tablet Take 1 tablet (20 mg total) by mouth daily. 30 tablet 1   traZODone (DESYREL) 100 MG tablet Take 100 mg by mouth at bedtime as needed for sleep.     vitamin C (ASCORBIC ACID) 500 MG tablet Take 500 mg by mouth daily.     spironolactone (ALDACTONE) 25 MG tablet Take 0.5 tablets (12.5 mg total) by mouth daily. 45 tablet 3   No current facility-administered medications for this visit.   Vitals:  07/28/23 1423 07/28/23 1424  BP: (!) 162/92 138/82  Pulse: 64   SpO2: 97%   Weight: 243 lb (110.2 kg)    Wt Readings from Last 3 Encounters:  07/28/23 243 lb (110.2 kg)  04/28/23 240 lb (108.9 kg)  04/03/23 241 lb (109.3 kg)   Lab Results  Component Value Date   CREATININE 1.29 (H) 04/28/2023   CREATININE 1.49 (H) 07/18/2022   CREATININE 1.73 (H) 06/07/2022    PHYSICAL EXAM:  General: Well appearing. No resp difficulty HEENT: normal Neck: supple, no JVD Cor: Regular rhythm, rate. No rubs, gallops or murmurs Lungs: clear Abdomen: soft, nontender, nondistended. Extremities: no cyanosis, clubbing, rash, trace pitting edema bilateral lower legs Neuro: alert & oriented X 3. Moves all 4 extremities w/o difficulty. Affect pleasant   ECG: not done   ASSESSMENT & PLAN:  1: NICM with preserved ejection fraction- - suspect due to uncontrolled HTN - NYHA class II - euvolemic today - weighing daily; reminded to call for an overnight weight gain of > 2 pounds or a weekly weight gain of > 5 pounds - weight up 2 pounds from last visit here 3 months ago - Echo 11/30/20: EF 55-60% without LVH. - Echo 11/21/21: EF 55-60% along with mild LVH and mild/moderate LAE - Echo 06/03/22: EF 50-55% along with moderate LVH and mild LAE/RAE.  - not adding salt; reviewed the importance of reading food labels to keep her daily sodium intake to 2000mg  / day -  trying to increase her activity and is walking daily at home and then going to the gym every other day - saw cardiology Fransico Michael) 01/24 - continue carvedilol 25mg  BID - continue hydralazine 100mg  TID - continue entresto 49/51 mg BID - continue torsemide 20mg  daily - continue spironolactone 12.5mg  daily; discussed titrating this up after getting lab results back - BMET today - BNP 06/05/22 was 149.6  2: HTN- - BP 162/92, rechecked was 135/82 - saw PCP at [facility] ([place]) 11/24 - BMP 04/28/23 reviewed and showed sodium 140, potassium 4.1, creatinine 1.29 and GFR 49 - BMET today - saw nephrology Suezanne Jacquet) 11/24  3: OSA- - has had previous sleep study 08/24 - will message PCP regarding results but also told patient to f/u with PCP at her next appointment next month  4: Pulmonary HTN- - RHC done 11/25/21:  Hemodynamic findings consistent with severe pulmonary hypertension.  Successful right heart catheterization via the right antecubital vein. This showed evidence of severely elevated right and left-sided filling pressures, severe pulmonary hypertension and normal cardiac output.  RA: 22 mmHg RV: 92/18 with an end-diastolic pressure of 32 mmHg. PA: 95/44 with a mean of 64 mmHg PCW: 30 mmHg  Cardiac output: 7.4 with an index of 3.16. Pulmonary vascular resistance: 4.5 Woods units   Return in 1 month, sooner if needed.   Sheila Freeze, FNP 07/28/23

## 2023-07-29 LAB — BASIC METABOLIC PANEL
BUN/Creatinine Ratio: 16 (ref 9–23)
BUN: 19 mg/dL (ref 6–24)
CO2: 25 mmol/L (ref 20–29)
Calcium: 9.2 mg/dL (ref 8.7–10.2)
Chloride: 104 mmol/L (ref 96–106)
Creatinine, Ser: 1.16 mg/dL — ABNORMAL HIGH (ref 0.57–1.00)
Glucose: 84 mg/dL (ref 70–99)
Potassium: 4.6 mmol/L (ref 3.5–5.2)
Sodium: 142 mmol/L (ref 134–144)
eGFR: 56 mL/min/{1.73_m2} — ABNORMAL LOW (ref >59)

## 2023-07-31 ENCOUNTER — Other Ambulatory Visit: Payer: Self-pay

## 2023-07-31 DIAGNOSIS — I5032 Chronic diastolic (congestive) heart failure: Secondary | ICD-10-CM

## 2023-07-31 MED ORDER — SPIRONOLACTONE 25 MG PO TABS: 25.0000 mg | ORAL_TABLET | Freq: Every day | ORAL | 3 refills | Status: DC

## 2023-08-07 ENCOUNTER — Encounter: Payer: Self-pay | Admitting: Gastroenterology

## 2023-08-11 ENCOUNTER — Encounter: Payer: Self-pay | Admitting: Gastroenterology

## 2023-08-24 ENCOUNTER — Ambulatory Visit: Admission: RE | Admit: 2023-08-24 | Payer: 59 | Source: Home / Self Care | Admitting: Gastroenterology

## 2023-08-24 HISTORY — DX: Sleep apnea, unspecified: G47.30

## 2023-08-24 HISTORY — DX: Chronic obstructive pulmonary disease, unspecified: J44.9

## 2023-08-24 HISTORY — DX: Hyperlipidemia, unspecified: E78.5

## 2023-08-24 HISTORY — DX: Anemia, unspecified: D64.9

## 2023-08-24 SURGERY — COLONOSCOPY WITH PROPOFOL
Anesthesia: General

## 2023-08-31 ENCOUNTER — Telehealth: Payer: Self-pay | Admitting: Family

## 2023-08-31 NOTE — Telephone Encounter (Incomplete)
 Called to confirm/remind patient of their appointment at the [facility] on 09/01/23***.   Appointment:   [] Confirmed  [x] Left mess   [] No answer/No voice mail  [] Phone not in service  Patient reminded to bring all medications and/or complete list.  Confirmed patient has transportation. Gave directions, instructed to utilize valet parking.

## 2023-08-31 NOTE — Progress Notes (Deleted)
 [facility] Note    PCP: Alexa Andrews, MD (last seen 11/24) Primary Cardiologist: Toribio Frees, PA (last seen 01/24)  Chief Complaint:   HPI:  Sheila Hays is a 53 y/o female with a history of HTN, hyperlipidemia, OSA, anemia, CKD, GERD, seizures and chronic heart failure. She had previously underwent nuclear stress test in 2016 that was abnormal, notable for perfusion defect of the lateral inferior segment with an EF of 35%. Echo at that time demonstrated an EF of 25-30%, mildly to moderately dilated LV cavity size, mild to moderate concentric LVH, mild to moderate MR, mild TR, PASP 55-60 mmHg, and a trivial pericardial effusion. Repeat echo in 2018 demonstrated a low normal LVSF with an EF of 50-55%, normal wall motion.  Echo 11/30/20: EF 55-60% without LVH.  She was admitted 6/28-7/14/23 for shortness of breath and chest pain. BNP ws 500s. CXR with b/l pleural effusions and concern for sepsis. She was admitted for diuresis with IV lasix  and hypertensive urgency. RHC 11/25/21 showed severe pulmonary HTN. She had a fall in the bathroom with negative work-up. She was moved to the ICU for milrinone  drip and PICC line. Echo 11/21/21: EF 55-60% along with mild LVH and mild/moderate LAE. Sildenafil  and BB were held for hypotension. Required norepinephrine . She was eventually transitioned to torsmide 40mg  daily.    Admitted 06/02/22 due to SOB/ cough due to bronchitis due to influenza. Echo 06/03/22: EF 50-55% along with moderate LVH and mild LAE/RAE.   She presents today for a HF follow-up visit with a chief complaint of       Had a sleep study done by her PCP 08/24 but says that she hasn't heard anything about the results.   ROS: All systems negative except as listed in HPI, PMH and Problem List.  SH:  Social History   Socioeconomic History   Marital status: Single    Spouse name: Not on file   Number of children: Not on file   Years of education: Not on file    Highest education level: Not on file  Occupational History   Not on file  Tobacco Use   Smoking status: Former    Current packs/day: 0.00    Average packs/day: 0.5 packs/day for 7.0 years (3.5 ttl pk-yrs)    Types: Cigarettes    Start date: 2011    Quit date: 2018    Years since quitting: 7.2   Smokeless tobacco: Former    Quit date: 03/07/2007  Vaping Use   Vaping status: Never Used  Substance and Sexual Activity   Alcohol use: Not Currently   Drug use: Never   Sexual activity: Not Currently  Other Topics Concern   Not on file  Social History Narrative   Not on file   Social Drivers of Health   Financial Resource Strain: Not on file  Food Insecurity: No Food Insecurity (06/03/2022)   Hunger Vital Sign    Worried About Running Out of Food in the Last Year: Never true    Ran Out of Food in the Last Year: Never true  Transportation Needs: No Transportation Needs (06/03/2022)   PRAPARE - Administrator, Civil Service (Medical): No    Lack of Transportation (Non-Medical): No  Physical Activity: Not on file  Stress: Not on file  Social Connections: Not on file  Intimate Partner Violence: Not At Risk (06/03/2022)   Humiliation, Afraid, Rape, and Kick questionnaire    Fear of Current or Ex-Partner: No  Emotionally Abused: No    Physically Abused: No    Sexually Abused: No    FH:  Family History  Problem Relation Age of Onset   Hypertension Mother    Hypertension Father    Heart failure Sister    Diabetes Sister    Seizures Sister    Breast cancer Neg Hx     Past Medical History:  Diagnosis Date   Anemia    CHF (congestive heart failure) (HCC)    Chronic kidney disease (CKD), stage III (moderate) (HCC)    COPD (chronic obstructive pulmonary disease) (HCC)    GERD (gastroesophageal reflux disease)    Hyperlipidemia    Hypertension    Seizures (HCC)    Followed at [facility]   Sleep apnea     Current Outpatient Medications  Medication Sig Dispense Refill    acetaminophen  (TYLENOL ) 500 MG tablet Take 500 mg by mouth every 8 (eight) hours as needed for mild pain.     Albuterol -Budesonide  (AIRSUPRA ) 90-80 MCG/ACT AERO Inhale 2 puffs into the lungs every 4 (four) hours as needed. 10.7 g 0   aspirin  EC 81 MG tablet Take 81 mg by mouth daily.     budesonide -formoterol  (SYMBICORT ) 80-4.5 MCG/ACT inhaler Take 2 puffs first thing in am and then another 2 puffs about 12 hours later. 1 each 12   carvedilol  (COREG ) 25 MG tablet Take 1 tablet (25 mg total) by mouth 2 (two) times daily. 60 tablet 3   hydrALAZINE  (APRESOLINE ) 25 MG tablet Take 1 tablet (25 mg total) by mouth 3 (three) times daily. (Patient taking differently: Take 100 mg by mouth 3 (three) times daily.) 90 tablet 1   ipratropium-albuterol  (DUONEB) 0.5-2.5 (3) MG/3ML SOLN Take 3 mLs by nebulization every 6 (six) hours as needed.     LINZESS  145 MCG CAPS capsule Take 145 mcg by mouth daily.     montelukast  (SINGULAIR ) 10 MG tablet Take 10 mg by mouth daily.     oxcarbazepine  (TRILEPTAL ) 600 MG tablet Take 600 mg by mouth 2 (two) times daily.     rosuvastatin  (CRESTOR ) 20 MG tablet Take 20 mg by mouth at bedtime.     sacubitril -valsartan  (ENTRESTO ) 49-51 MG Take 1 tablet by mouth 2 (two) times daily. 60 tablet 5   spironolactone  (ALDACTONE ) 25 MG tablet Take 1 tablet (25 mg total) by mouth daily. 90 tablet 3   torsemide  (DEMADEX ) 20 MG tablet Take 1 tablet (20 mg total) by mouth daily. 30 tablet 1   traZODone  (DESYREL ) 100 MG tablet Take 100 mg by mouth at bedtime as needed for sleep.     vitamin C (ASCORBIC ACID ) 500 MG tablet Take 500 mg by mouth daily.     No current facility-administered medications for this visit.      PHYSICAL EXAM:  General: Well appearing. No resp difficulty HEENT: normal Neck: supple, no JVD Cor: Regular rhythm, rate. No rubs, gallops or murmurs Lungs: clear Abdomen: soft, nontender, nondistended. Extremities: no cyanosis, clubbing, rash, trace pitting edema  bilateral lower legs Neuro: alert & oriented X 3. Moves all 4 extremities w/o difficulty. Affect pleasant   ECG: not done   ASSESSMENT & PLAN:  1: NICM with preserved ejection fraction- - suspect due to uncontrolled HTN - NYHA class II - euvolemic today - weighing daily; reminded to call for an overnight weight gain of > 2 pounds or a weekly weight gain of > 5 pounds - weight 243 pounds from last visit here 1 month ago -  Echo 11/30/20: EF 55-60% without LVH. - Echo 11/21/21: EF 55-60% along with mild LVH and mild/moderate LAE - Echo 06/03/22: EF 50-55% along with moderate LVH and mild LAE/RAE.  - not adding salt; reviewed the importance of reading food labels to keep her daily sodium intake to 2000mg  / day - trying to increase her activity and is walking daily at home and then going to the gym every other day - saw cardiology Gennaro Khat) 01/24 - continue carvedilol  25mg  BID - continue hydralazine  100mg  TID - continue entresto  49/51 mg BID - continue torsemide  20mg  daily - continue spironolactone  12.5mg  daily; discussed titrating this up after getting lab results back - BNP 06/05/22 was 149.6  2: HTN- - BP 162/92, rechecked was 135/82 - saw PCP at [facility] ([place]) 11/24 - BMP 07/28/23 reviewed and showed sodium 142, potassium 4.6, creatinine 1.16 and GFR 56 - saw nephrology (Korrapati) 11/24  3: OSA- - has had previous sleep study 08/24  4: Pulmonary HTN- - RHC done 11/25/21:  Hemodynamic findings consistent with severe pulmonary hypertension.  Successful right heart catheterization via the right antecubital vein. This showed evidence of severely elevated right and left-sided filling pressures, severe pulmonary hypertension and normal cardiac output.  RA: 22 mmHg RV: 92/18 with an end-diastolic pressure of 32 mmHg. PA: 95/44 with a mean of 64 mmHg PCW: 30 mmHg  Cardiac output: 7.4 with an index of 3.16. Pulmonary vascular resistance: 4.5 Woods  units     Charlette Console, [place] 08/31/23

## 2023-09-01 ENCOUNTER — Telehealth: Payer: Self-pay | Admitting: Family

## 2023-09-01 ENCOUNTER — Encounter: Admitting: Family

## 2023-09-01 NOTE — Telephone Encounter (Signed)
 Patient did not show for her [facility] Failure Clinic appointment on 09/01/23.

## 2023-10-05 ENCOUNTER — Telehealth: Payer: Self-pay | Admitting: Family

## 2023-10-05 NOTE — Progress Notes (Unsigned)
 Advanced [facility] Failure Clinic Note    PCP: Alexa Andrews, MD (last seen 11/24) Primary Cardiologist: Gennaro Khat, [place], PA (last seen 01/24)  Chief Complaint: shortness of breath  HPI:  Sheila Hays is a 53 y/o female with a history of HTN, hyperlipidemia, OSA, anemia, CKD, GERD, seizures and chronic heart failure. She had previously underwent nuclear stress test in 2016 that was abnormal, notable for perfusion defect of the lateral inferior segment with an EF of 35%. Echo at that time demonstrated an EF of 25-30%, mildly to moderately dilated LV cavity size, mild to moderate concentric LVH, mild to moderate MR, mild TR, PASP 55-60 mmHg, and a trivial pericardial effusion. Repeat echo in 2018 demonstrated a low normal LVSF with an EF of 50-55%, normal wall motion.  She was admitted 6/28-7/14/23 for shortness of breath and chest pain. BNP ws 500s. CXR with b/l pleural effusions and concern for sepsis. She was admitted for diuresis with IV lasix  and hypertensive urgency. RHC 11/25/21 showed severe pulmonary HTN. She had a fall in the bathroom with negative work-up. She was moved to the ICU for milrinone  drip and PICC line. Sildenafil  and BB were held for hypotension. Required norepinephrine . She was eventually transitioned to torsmide 40mg  daily.    Admitted 06/02/22 due to SOB/ cough due to bronchitis due to influenza.   Echo 11/30/20: EF 55-60% without LVH. Echo 11/21/21: EF 55-60% along with mild LVH and mild/moderate LAE Echo 06/03/22: EF 50-55% along with moderate LVH and mild LAE/RAE.     RHC done 11/25/21 and showed: Hemodynamic findings consistent with severe pulmonary hypertension.  Successful right heart catheterization via the right antecubital vein. This showed evidence of severely elevated right and left-sided filling pressures, severe pulmonary hypertension and normal cardiac output.  RA: 22 mmHg RV: 92/18 with an end-diastolic pressure of 32 mmHg. PA: 95/44 with a mean of 64 mmHg PCW:  30 mmHg  Cardiac output: 7.4 with an index of 3.16. Pulmonary vascular resistance: 4.5 Woods units  She presents today for a HF follow-up visit with a chief complaint of shortness of breath (improving). Has associated fatigue, occasional palpitations, occasional dizziness, pedal edema (improving). Denies chest pain, cough, abdominal distention or difficulty sleeping.   She says that her PCP increased her torsemide  to 60mg  daily X 3 days and then decreased it back down to 20mg  daily. She says that she feels like the swelling is returning since the reduction back to 20mg  daily.   ROS: All systems negative except as listed in HPI, PMH and Problem List.  SH:  Social History   Socioeconomic History   Marital status: Single    Spouse name: Not on file   Number of children: Not on file   Years of education: Not on file   Highest education level: Not on file  Occupational History   Not on file  Tobacco Use   Smoking status: Former    Current packs/day: 0.00    Average packs/day: 0.5 packs/day for 7.0 years (3.5 ttl pk-yrs)    Types: Cigarettes    Start date: 2011    Quit date: 2018    Years since quitting: 7.3   Smokeless tobacco: Former    Quit date: 03/07/2007  Vaping Use   Vaping status: Never Used  Substance and Sexual Activity   Alcohol use: Not Currently   Drug use: Never   Sexual activity: Not Currently  Other Topics Concern   Not on file  Social History Narrative   Not on file  Social Drivers of Corporate investment banker Strain: Not on file  Food Insecurity: No Food Insecurity (06/03/2022)   Hunger Vital Sign    Worried About Running Out of Food in the Last Year: Never true    Ran Out of Food in the Last Year: Never true  Transportation Needs: No Transportation Needs (06/03/2022)   PRAPARE - Administrator, Civil Service (Medical): No    Lack of Transportation (Non-Medical): No  Physical Activity: Not on file  Stress: Not on file  Social Connections:  Not on file  Intimate Partner Violence: Not At Risk (06/03/2022)   Humiliation, Afraid, Rape, and Kick questionnaire    Fear of Current or Ex-Partner: No    Emotionally Abused: No    Physically Abused: No    Sexually Abused: No    FH:  Family History  Problem Relation Age of Onset   Hypertension Mother    Hypertension Father    Heart failure Sister    Diabetes Sister    Seizures Sister    Breast cancer Neg Hx     Past Medical History:  Diagnosis Date   Anemia    CHF (congestive heart failure) (HCC)    Chronic kidney disease (CKD), stage III (moderate) (HCC)    COPD (chronic obstructive pulmonary disease) (HCC)    GERD (gastroesophageal reflux disease)    Hyperlipidemia    Hypertension    Seizures (HCC)    Follow[facility] at [facility]   Sleep apnea     Current Outpatient Medications  Medication Sig Dispense Refill   acetaminophen  (TYLENOL ) 500 MG tablet Take 500 mg by mouth every 8 (eight) hours as needed for mild pain.     Albuterol -Budesonide  (AIRSUPRA ) 90-80 MCG/ACT AERO Inhale 2 puffs into the lungs every 4 (four) hours as needed. 10.7 g 0   aspirin  EC 81 MG tablet Take 81 mg by mouth daily.     budesonide -formoterol  (SYMBICORT ) 80-4.5 MCG/ACT inhaler Take 2 puffs first thing in am and then another 2 puffs about 12 hours later. 1 each 12   carvedilol  (COREG ) 25 MG tablet Take 1 tablet (25 mg total) by mouth 2 (two) times daily. 60 tablet 3   hydrALAZINE  (APRESOLINE ) 25 MG tablet Take 1 tablet (25 mg total) by mouth 3 (three) times daily. (Patient taking differently: Take 100 mg by mouth 3 (three) times daily.) 90 tablet 1   ipratropium-albuterol  (DUONEB) 0.5-2.5 (3) MG/3ML SOLN Take 3 mLs by nebulization every 6 (six) hours as needed.     LINZESS  145 MCG CAPS capsule Take 145 mcg by mouth daily.     montelukast  (SINGULAIR ) 10 MG tablet Take 10 mg by mouth daily.     oxcarbazepine  (TRILEPTAL ) 600 MG tablet Take 600 mg by mouth 2 (two) times daily.     rosuvastatin  (CRESTOR ) 20 MG  tablet Take 20 mg by mouth at bedtime.     sacubitril -valsartan  (ENTRESTO ) 49-51 MG Take 1 tablet by mouth 2 (two) times daily. 60 tablet 5   spironolactone  (ALDACTONE ) 25 MG tablet Take 1 tablet (25 mg total) by mouth daily. 90 tablet 3   torsemide  (DEMADEX ) 20 MG tablet Take 1 tablet (20 mg total) by mouth daily. 30 tablet 1   traZODone  (DESYREL ) 100 MG tablet Take 100 mg by mouth at bedtime as needed for sleep.     vitamin C (ASCORBIC ACID ) 500 MG tablet Take 500 mg by mouth daily.     No current facility-administered medications for this visit.  Vitals:   10/06/23 1517  BP: (!) 165/64  Pulse: 71  SpO2: 99%  Weight: 253 lb (114.8 kg)   Wt Readings from Last 3 Encounters:  10/06/23 253 lb (114.8 kg)  07/28/23 243 lb (110.2 kg)  04/28/23 240 lb (108.9 kg)   Lab Results  Component Value Date   CREATININE 1.16 (H) 07/28/2023   CREATININE 1.29 (H) 04/28/2023   CREATININE 1.49 (H) 07/18/2022   PHYSICAL EXAM:  General: Well appearing. No resp difficulty HEENT: normal Neck: supple, no JVD Cor: Regular rhythm, rate. No rubs, gallops or murmurs Lungs: clear Abdomen: soft, nontender, nondistended. Extremities: no cyanosis, clubbing, rash, 1+ pitting edema bilateral lower legs Neuro: alert & oriented X 3. Moves all 4 extremities w/o difficulty. Affect pleasant   ECG: not done   ASSESSMENT & PLAN:  1: NICM with preserved ejection fraction- - suspect due to uncontrolled HTN - NYHA class II - euvolemic today - weighing daily; reminded to call for an overnight weight gain of > 2 pounds or a weekly weight gain of > 5 pounds - weight up 10 pounds from last visit here 2 months ago - Echo 11/30/20: EF 55-60% without LVH. - Echo 11/21/21: EF 55-60% along with mild LVH and mild/moderate LAE - Echo 06/03/22: EF 50-55% along with moderate LVH and mild LAE/RAE.  - not adding salt; reviewed the importance of reading food labels to keep her daily sodium intake to 2000mg  / day - trying to  increase her activity and is walking daily at home and then going to the gym every other day - saw cardiology Gennaro Khat) 01/24 - continue carvedilol  25mg  BID - continue hydralazine  100mg  TID - continue entresto  49/51 mg BID - continue torsemide  20mg  daily; most likely will need 40mg  daily but will get BMET results back today first - continue spironolactone  25mg  daily - BMET today - BNP 06/05/22 was 149.6  2: HTN- - BP 165/64 - saw PCP at [facility] 11/24 - BMP 07/28/23 reviewed: sodium 142, potassium 4.6, creatinine 1.16 and GFR 56 - BMET today - saw nephrology (Korrapati) 11/24  3: OSA- - has had previous sleep study 08/24  4: Pulmonary HTN- - RHC done 11/25/21:  Hemodynamic findings consistent with severe pulmonary hypertension.  Successful right heart catheterization via the right antecubital vein. This showed evidence of severely elevated right and left-sided filling pressures, severe pulmonary hypertension and normal cardiac output.  RA: 22 mmHg RV: 92/18 with an end-diastolic pressure of 32 mmHg. PA: 95/44 with a mean of 64 mmHg PCW: 30 mmHg  Cardiac output: 7.4 with an index of 3.16. Pulmonary vascular resistance: 4.5 Woods units   Return in 1 month, sooner if needed.   Charlette Console, FNP 10/05/23

## 2023-10-05 NOTE — Telephone Encounter (Signed)
 Called to confirm/remind patient of their appointment at the Advanced Heart Failure Clinic on 10/06/23.   Appointment:   [x] Confirmed  [] Left mess   [] No answer/No voice mail  [] VM Full/unable to leave message  [] Phone not in service  Patient reminded to bring all medications and/or complete list.  Confirmed patient has transportation. Gave directions, instructed to utilize valet parking.

## 2023-10-06 ENCOUNTER — Ambulatory Visit: Attending: Family | Admitting: Family

## 2023-10-06 ENCOUNTER — Encounter: Payer: Self-pay | Admitting: Family

## 2023-10-06 VITALS — BP 165/64 | HR 71 | Wt 253.0 lb

## 2023-10-06 DIAGNOSIS — Z79899 Other long term (current) drug therapy: Secondary | ICD-10-CM | POA: Diagnosis not present

## 2023-10-06 DIAGNOSIS — K219 Gastro-esophageal reflux disease without esophagitis: Secondary | ICD-10-CM | POA: Insufficient documentation

## 2023-10-06 DIAGNOSIS — E785 Hyperlipidemia, unspecified: Secondary | ICD-10-CM | POA: Insufficient documentation

## 2023-10-06 DIAGNOSIS — G4733 Obstructive sleep apnea (adult) (pediatric): Secondary | ICD-10-CM | POA: Diagnosis not present

## 2023-10-06 DIAGNOSIS — I5032 Chronic diastolic (congestive) heart failure: Secondary | ICD-10-CM | POA: Diagnosis present

## 2023-10-06 DIAGNOSIS — I272 Pulmonary hypertension, unspecified: Secondary | ICD-10-CM | POA: Diagnosis not present

## 2023-10-06 DIAGNOSIS — I10 Essential (primary) hypertension: Secondary | ICD-10-CM | POA: Diagnosis not present

## 2023-10-06 DIAGNOSIS — Z87891 Personal history of nicotine dependence: Secondary | ICD-10-CM | POA: Diagnosis not present

## 2023-10-06 DIAGNOSIS — Z862 Personal history of diseases of the blood and blood-forming organs and certain disorders involving the immune mechanism: Secondary | ICD-10-CM | POA: Insufficient documentation

## 2023-10-06 DIAGNOSIS — R569 Unspecified convulsions: Secondary | ICD-10-CM | POA: Diagnosis not present

## 2023-10-06 DIAGNOSIS — N183 Chronic kidney disease, stage 3 unspecified: Secondary | ICD-10-CM | POA: Diagnosis not present

## 2023-10-06 DIAGNOSIS — I130 Hypertensive heart and chronic kidney disease with heart failure and stage 1 through stage 4 chronic kidney disease, or unspecified chronic kidney disease: Secondary | ICD-10-CM | POA: Insufficient documentation

## 2023-10-06 DIAGNOSIS — I428 Other cardiomyopathies: Secondary | ICD-10-CM | POA: Diagnosis not present

## 2023-10-06 NOTE — Patient Instructions (Signed)
 Medication Changes:  No medication changes today!  Lab Work:  Go DOWN to LOWER LEVEL (LL) to have your blood work completed inside of [employer] office.  We will only call you if the results are abnormal or if the provider would like to make medication changes.   Follow-Up in: Please follow up with the Advanced Heart Failure Clinic in 1 month with Shawnee Dellen, FNP.  At the Advanced [facility] Failure Clinic, you and your health needs are our priority. We have a designated team specialized in the treatment of Heart Failure. This Care Team includes your primary Heart Failure Specialized Cardiologist (physician), Advanced Practice Providers (APPs- Physician Assistants and Nurse Practitioners), and Pharmacist who all work together to provide you with the care you need, when you need it.   You may see any of the following providers on your designated Care Team at your next follow up:  Dr. Jules Oar Dr. Peder Bourdon Dr. Alwin Baars Dr. Judyth Nunnery Shawnee Dellen, FNP Bevely Brush, RPH-CPP  Please be sure to bring in all your medications bottles to every appointment.   Need to Contact Us :  If you have any questions or concerns before your next appointment please send us  a message through [place] or call our office at [phone].    TO LEAVE A MESSAGE FOR THE NURSE SELECT OPTION 2, PLEASE LEAVE A MESSAGE INCLUDING: YOUR NAME DATE OF BIRTH CALL BACK NUMBER REASON FOR CALL**this is important as we prioritize the call backs  YOU WILL RECEIVE A CALL BACK THE SAME DAY AS LONG AS YOU CALL BEFORE 4:00 PM

## 2023-10-07 ENCOUNTER — Ambulatory Visit: Payer: Self-pay | Admitting: Family

## 2023-10-07 ENCOUNTER — Other Ambulatory Visit: Payer: Self-pay | Admitting: Family

## 2023-10-07 DIAGNOSIS — I5032 Chronic diastolic (congestive) heart failure: Secondary | ICD-10-CM

## 2023-10-07 LAB — BASIC METABOLIC PANEL WITH GFR
BUN/Creatinine Ratio: 18 (ref 9–23)
BUN: 24 mg/dL (ref 6–24)
CO2: 28 mmol/L (ref 20–29)
Calcium: 9.1 mg/dL (ref 8.7–10.2)
Chloride: 102 mmol/L (ref 96–106)
Creatinine, Ser: 1.32 mg/dL — ABNORMAL HIGH (ref 0.57–1.00)
Glucose: 94 mg/dL (ref 70–99)
Potassium: 4.3 mmol/L (ref 3.5–5.2)
Sodium: 144 mmol/L (ref 134–144)
eGFR: 48 mL/min/{1.73_m2} — ABNORMAL LOW (ref >59)

## 2023-10-07 LAB — PRO B NATRIURETIC PEPTIDE: NT-Pro BNP: 3078 pg/mL — ABNORMAL HIGH (ref 0–249)

## 2023-10-07 MED ORDER — TORSEMIDE 20 MG PO TABS: 40.0000 mg | ORAL_TABLET | Freq: Every day | ORAL | Status: DC

## 2023-11-02 ENCOUNTER — Telehealth: Payer: Self-pay | Admitting: Family

## 2023-11-02 NOTE — Telephone Encounter (Signed)
 Called to confirm/remind patient of their appointment at the Advanced [facility] Failure Clinic on 11/03/23.   Appointment:   [] Confirmed  [] Left mess   [] No answer/No voice mail  [x] VM Full/unable to leave message  [] Phone not in service  Patient reminded to bring all medications and/or complete list.  Confirmed patient has transportation. Gave directions, instructed to utilize valet parking.

## 2023-11-03 ENCOUNTER — Telehealth: Payer: Self-pay | Admitting: Family

## 2023-11-03 ENCOUNTER — Encounter: Admitting: Family

## 2023-11-03 NOTE — Telephone Encounter (Signed)
 Patient did not show for her Heart Failure Clinic appointment on 11/03/23.

## 2023-11-03 NOTE — Progress Notes (Deleted)
 Advanced Heart Failure Clinic Note    PCP: Alexa Andrews, MD (last seen 11/24) Primary Cardiologist: Toribio Frees, PA (last seen 01/24)  Chief Complaint:    HPI:  Ms Addis is a 53 y/o female with a history of HTN, hyperlipidemia, OSA, anemia, CKD, GERD, seizures and chronic heart failure. She had previously underwent nuclear stress test in 2016 that was abnormal, notable for perfusion defect of the lateral inferior segment with an EF of 35%. Echo at that time demonstrated an EF of 25-30%, mildly to moderately dilated LV cavity size, mild to moderate concentric LVH, mild to moderate MR, mild TR, PASP 55-60 mmHg, and a trivial pericardial effusion. Repeat echo in 2018 demonstrated a low normal LVSF with an EF of 50-55%, normal wall motion.  She was admitted 6/28-7/14/23 for shortness of breath and chest pain. BNP ws 500s. CXR with b/l pleural effusions and concern for sepsis. She was admitted for diuresis with IV lasix  and hypertensive urgency. RHC 11/25/21 showed severe pulmonary HTN. She had a fall in the bathroom with negative work-up. She was moved to the ICU for milrinone  drip and PICC line. Sildenafil  and BB were held for hypotension. Required norepinephrine . She was eventually transitioned to torsmide 40mg  daily.    Admitted 06/02/22 due to SOB/ cough due to bronchitis due to influenza.   Echo 11/30/20: EF 55-60% without LVH. Echo 11/21/21: EF 55-60% along with mild LVH and mild/moderate LAE Echo 06/03/22: EF 50-55% along with moderate LVH and mild LAE/RAE.     RHC done 11/25/21 and showed: Hemodynamic findings consistent with severe pulmonary hypertension.  Successful right heart catheterization via the right antecubital vein. This showed evidence of severely elevated right and left-sided filling pressures, severe pulmonary hypertension and normal cardiac output.  RA: 22 mmHg RV: 92/18 with an end-diastolic pressure of 32 mmHg. PA: 95/44 with a mean of 64 mmHg PCW: 30 mmHg   Cardiac output: 7.4 with an index of 3.16. Pulmonary vascular resistance: 4.5 Woods units  Seen in the [facility] 05/25 and after getting lab results back, torsemide  was increased to 40mg  daily  She presents today for a HF follow-up visit with a chief complaint of    ROS: All systems negative except as listed in HPI, PMH and Problem List.  SH:  Social History   Socioeconomic History   Marital status: Single    Spouse name: Not on file   Number of children: Not on file   Years of education: Not on file   Highest education level: Not on file  Occupational History   Not on file  Tobacco Use   Smoking status: Former    Current packs/day: 0.00    Average packs/day: 0.5 packs/day for 7.0 years (3.5 ttl pk-yrs)    Types: Cigarettes    Start date: 2011    Quit date: 2018    Years since quitting: 7.4   Smokeless tobacco: Former    Quit date: 03/07/2007  Vaping Use   Vaping status: Never Used  Substance and Sexual Activity   Alcohol use: Not Currently   Drug use: Never   Sexual activity: Not Currently  Other Topics Concern   Not on file  Social History Narrative   Not on file   Social Drivers of Health   Financial Resource Strain: Not on file  Food Insecurity: No Food Insecurity (06/03/2022)   Hunger Vital Sign    Worried About Running Out of Food in the Last Year: Never true    Ran Out of  Food in the Last Year: Never true  Transportation Needs: No Transportation Needs (06/03/2022)   PRAPARE - Administrator, Civil Service (Medical): No    Lack of Transportation (Non-Medical): No  Physical Activity: Not on file  Stress: Not on file  Social Connections: Not on file  Intimate Partner Violence: Not At Risk (06/03/2022)   Humiliation, Afraid, Rape, and Kick questionnaire    Fear of Current or Ex-Partner: No    Emotionally Abused: No    Physically Abused: No    Sexually Abused: No    FH:  Family History  Problem Relation Age of Onset   Hypertension Mother     Hypertension Father    Heart failure Sister    Diabetes Sister    Seizures Sister    Breast cancer Neg Hx     Past Medical History:  Diagnosis Date   Anemia    CHF (congestive heart failure) (HCC)    Chronic kidney disease (CKD), stage III (moderate) (HCC)    COPD (chronic obstructive pulmonary disease) (HCC)    GERD (gastroesophageal reflux disease)    Hyperlipidemia    Hypertension    Seizures (HCC)    Followed at [facility]   Sleep apnea     Current Outpatient Medications  Medication Sig Dispense Refill   acetaminophen  (TYLENOL ) 500 MG tablet Take 500 mg by mouth every 8 (eight) hours as needed for mild pain.     Albuterol -Budesonide  (AIRSUPRA ) 90-80 MCG/ACT AERO Inhale 2 puffs into the lungs every 4 (four) hours as needed. 10.7 g 0   aspirin  EC 81 MG tablet Take 81 mg by mouth daily.     budesonide -formoterol  (SYMBICORT ) 80-4.5 MCG/ACT inhaler Take 2 puffs first thing in am and then another 2 puffs about 12 hours later. 1 each 12   carvedilol  (COREG ) 25 MG tablet Take 1 tablet (25 mg total) by mouth 2 (two) times daily. 60 tablet 3   hydrALAZINE  (APRESOLINE ) 25 MG tablet Take 1 tablet (25 mg total) by mouth 3 (three) times daily. (Patient taking differently: Take 100 mg by mouth 3 (three) times daily.) 90 tablet 1   ipratropium-albuterol  (DUONEB) 0.5-2.5 (3) MG/3ML SOLN Take 3 mLs by nebulization every 6 (six) hours as needed.     LINZESS  145 MCG CAPS capsule Take 145 mcg by mouth daily.     montelukast  (SINGULAIR ) 10 MG tablet Take 10 mg by mouth daily.     oxcarbazepine  (TRILEPTAL ) 600 MG tablet Take 600 mg by mouth 2 (two) times daily.     rosuvastatin  (CRESTOR ) 20 MG tablet Take 20 mg by mouth at bedtime.     sacubitril -valsartan  (ENTRESTO ) 49-51 MG Take 1 tablet by mouth 2 (two) times daily. 60 tablet 5   spironolactone  (ALDACTONE ) 25 MG tablet Take 1 tablet (25 mg total) by mouth daily. 90 tablet 3   torsemide  (DEMADEX ) 20 MG tablet Take 2 tablets (40 mg total) by mouth daily.      traZODone  (DESYREL ) 100 MG tablet Take 100 mg by mouth at bedtime as needed for sleep.     vitamin C (ASCORBIC ACID ) 500 MG tablet Take 500 mg by mouth daily.     No current facility-administered medications for this visit.     PHYSICAL EXAM:  General: Well appearing. No resp difficulty HEENT: normal Neck: supple, no JVD Cor: Regular rhythm, rate. No rubs, gallops or murmurs Lungs: clear Abdomen: soft, nontender, nondistended. Extremities: no cyanosis, clubbing, rash, 1+ pitting edema bilateral lower legs Neuro: alert &  oriented X 3. Moves all 4 extremities w/o difficulty. Affect pleasant   ECG: not done   ASSESSMENT & PLAN:  1: NICM with preserved ejection fraction- - suspect due to uncontrolled HTN - NYHA class II - euvolemic today - weighing daily; reminded to call for an overnight weight gain of > 2 pounds or a weekly weight gain of > 5 pounds - weight 253 pounds from last visit here 1 month ago - Echo 11/30/20: EF 55-60% without LVH. - Echo 11/21/21: EF 55-60% along with mild LVH and mild/moderate LAE - Echo 06/03/22: EF 50-55% along with moderate LVH and mild LAE/RAE.  - not adding salt; reviewed the importance of reading food labels to keep her daily sodium intake to 2000mg  / day - trying to increase her activity and is walking daily at home and then going to the gym every other day - saw cardiology Gennaro Khat) 01/24 - continue carvedilol  25mg  BID - continue hydralazine  100mg  TID - continue entresto  49/51 mg BID - continue torsemide  40mg  daily - continue spironolactone  25mg  daily - BMET today - BNP 06/05/22 was 149.6  2: HTN- - BP  - saw PCP at [facility] 11/24 - BMP 10/06/23 reviewed: sodium 144, potassium 4.3, creatinine 1.32 and GFR 48 - BMET today - saw nephrology (Korrapati) 11/24  3: OSA- - has had previous sleep study 08/24  4: Pulmonary HTN- - RHC done 11/25/21:  Hemodynamic findings consistent with severe pulmonary  hypertension.  Successful right heart catheterization via the right antecubital vein. This showed evidence of severely elevated right and left-sided filling pressures, severe pulmonary hypertension and normal cardiac output.  RA: 22 mmHg RV: 92/18 with an end-diastolic pressure of 32 mmHg. PA: 95/44 with a mean of 64 mmHg PCW: 30 mmHg  Cardiac output: 7.4 with an index of 3.16. Pulmonary vascular resistance: 4.5 Woods units     Charlette Console, [place] 11/03/23

## 2023-11-30 ENCOUNTER — Encounter: Payer: Self-pay | Admitting: Family

## 2023-12-08 ENCOUNTER — Telehealth: Payer: Self-pay | Admitting: Family

## 2023-12-08 NOTE — Telephone Encounter (Signed)
 Called to confirm/remind patient of their appointment at the [facility] on 12/09/23.   Appointment:   [x] Confirmed  [] Left mess   [] No answer/No voice mail  [] VM Full/unable to leave message  [] Phone not in service  Patient reminded to bring all medications and/or complete list.  Confirmed patient has transportation. Gave directions, instructed to utilize valet parking.

## 2023-12-08 NOTE — Progress Notes (Deleted)
 Advanced Heart Failure Clinic Note    PCP: Sampson Scales, MD Primary Cardiologist: Franchester Mail, PA   Chief Complaint:   HPI:  Sheila Hays is a 53 y/o female with a history of HTN, hyperlipidemia, OSA, anemia, severe PH, CKD, GERD, seizures and chronic heart failure. She had previously underwent nuclear stress test in 2016 that was abnormal, notable for perfusion defect of the lateral inferior segment with an EF of 35%. Echo at that time demonstrated an EF of 25-30%, mildly to moderately dilated LV cavity size, mild to moderate concentric LVH, mild to moderate MR, mild TR, PASP 55-60 mmHg, and a trivial pericardial effusion. Repeat echo in 2018 demonstrated a low normal LVSF with an EF of 50-55%, normal wall motion.  Echo 11/30/20: EF 55-60% without LVH.  She was admitted 6/28-7/14/23 for shortness of breath and chest pain. BNP ws 500s. CXR with b/l pleural effusions and concern for sepsis. She was admitted for diuresis with IV lasix  and hypertensive urgency. Echo 11/21/21: EF 55-60% along with mild LVH and mild/moderate LAE. RHC 11/25/21 showed severe pulmonary HTN. She had a fall in the bathroom with negative work-up. She was moved to the ICU for milrinone  drip and PICC line. Sildenafil  and BB were held for hypotension. Required norepinephrine . She was eventually transitioned to torsmide 40mg  daily.    RHC done 11/25/21 and showed: Hemodynamic findings consistent with severe pulmonary hypertension.  Successful right heart catheterization via the right antecubital vein. This showed evidence of severely elevated right and left-sided filling pressures, severe pulmonary hypertension and normal cardiac output.  RA: 22 mmHg RV: 92/18 with an end-diastolic pressure of 32 mmHg. PA: 95/44 with a mean of 64 mmHg PCW: 30 mmHg  Cardiac output: 7.4 with an index of 3.16. Pulmonary vascular resistance: 4.5 Woods units  Admitted 06/02/22 due to SOB/ cough due to bronchitis due to influenza. Treated for  acute bronchitis secondary to influenza A complicated by COPD exacerbation. Echo 06/03/22: EF 50-55% along with moderate LVH and mild LAE/RAE.    Seen in [facility] 05/25 where torsemide  was increased to 40mg  daily.   She presents today for a HF follow-up visit with a chief complaint of   ROS: All systems negative except as listed in HPI, PMH and Problem List.  SH:  Social History   Socioeconomic History   Marital status: Single    Spouse name: Not on file   Number of children: Not on file   Years of education: Not on file   Highest education level: Not on file  Occupational History   Not on file  Tobacco Use   Smoking status: Former    Current packs/day: 0.00    Average packs/day: 0.5 packs/day for 7.0 years (3.5 ttl pk-yrs)    Types: Cigarettes    Start date: 2011    Quit date: 2018    Years since quitting: 7.5   Smokeless tobacco: Former    Quit date: 03/07/2007  Vaping Use   Vaping status: Never Used  Substance and Sexual Activity   Alcohol use: Not Currently   Drug use: Never   Sexual activity: Not Currently  Other Topics Concern   Not on file  Social History Narrative   Not on file   Social Drivers of Health   Financial Resource Strain: Not on file  Food Insecurity: No Food Insecurity (06/03/2022)   Hunger Vital Sign    Worried About Running Out of Food in the Last Year: Never true    Ran Out of  Food in the Last Year: Never true  Transportation Needs: No Transportation Needs (06/03/2022)   PRAPARE - Administrator, Civil Service (Medical): No    Lack of Transportation (Non-Medical): No  Physical Activity: Not on file  Stress: Not on file  Social Connections: Not on file  Intimate Partner Violence: Not At Risk (06/03/2022)   Humiliation, Afraid, Rape, and Kick questionnaire    Fear of Current or Ex-Partner: No    Emotionally Abused: No    Physically Abused: No    Sexually Abused: No    FH:  Family History  Problem Relation Age of Onset   Hypertension  Mother    Hypertension Father    Heart failure Sister    Diabetes Sister    Seizures Sister    Breast cancer Neg Hx     Past Medical History:  Diagnosis Date   Anemia    CHF (congestive heart failure) (HCC)    Chronic kidney disease (CKD), stage III (moderate) (HCC)    COPD (chronic obstructive pulmonary disease) (HCC)    GERD (gastroesophageal reflux disease)    Hyperlipidemia    Hypertension    Seizures (HCC)    Followed at [facility]   Sleep apnea     Current Outpatient Medications  Medication Sig Dispense Refill   acetaminophen  (TYLENOL ) 500 MG tablet Take 500 mg by mouth every 8 (eight) hours as needed for mild pain.     Albuterol -Budesonide  (AIRSUPRA ) 90-80 MCG/ACT AERO Inhale 2 puffs into the lungs every 4 (four) hours as needed. 10.7 g 0   aspirin  EC 81 MG tablet Take 81 mg by mouth daily.     budesonide -formoterol  (SYMBICORT ) 80-4.5 MCG/ACT inhaler Take 2 puffs first thing in am and then another 2 puffs about 12 hours later. 1 each 12   carvedilol  (COREG ) 25 MG tablet Take 1 tablet (25 mg total) by mouth 2 (two) times daily. 60 tablet 3   hydrALAZINE  (APRESOLINE ) 25 MG tablet Take 1 tablet (25 mg total) by mouth 3 (three) times daily. (Patient taking differently: Take 100 mg by mouth 3 (three) times daily.) 90 tablet 1   ipratropium-albuterol  (DUONEB) 0.5-2.5 (3) MG/3ML SOLN Take 3 mLs by nebulization every 6 (six) hours as needed.     LINZESS  145 MCG CAPS capsule Take 145 mcg by mouth daily.     montelukast  (SINGULAIR ) 10 MG tablet Take 10 mg by mouth daily.     oxcarbazepine  (TRILEPTAL ) 600 MG tablet Take 600 mg by mouth 2 (two) times daily.     rosuvastatin  (CRESTOR ) 20 MG tablet Take 20 mg by mouth at bedtime.     sacubitril -valsartan  (ENTRESTO ) 49-51 MG Take 1 tablet by mouth 2 (two) times daily. 60 tablet 5   spironolactone  (ALDACTONE ) 25 MG tablet Take 1 tablet (25 mg total) by mouth daily. 90 tablet 3   torsemide  (DEMADEX ) 20 MG tablet Take 2 tablets (40 mg total) by  mouth daily.     traZODone  (DESYREL ) 100 MG tablet Take 100 mg by mouth at bedtime as needed for sleep.     vitamin C (ASCORBIC ACID ) 500 MG tablet Take 500 mg by mouth daily.     No current facility-administered medications for this visit.     PHYSICAL EXAM:  General: Well appearing. No resp difficulty HEENT: normal Neck: supple, no JVD Cor: Regular rhythm, rate. No rubs, gallops or murmurs Lungs: clear Abdomen: soft, nontender, nondistended. Extremities: no cyanosis, clubbing, rash, 1+ pitting edema bilateral lower legs Neuro: alert &  oriented X 3. Moves all 4 extremities w/o difficulty. Affect pleasant   ECG: not done   ASSESSMENT & PLAN:  1: NICM with preserved ejection fraction- - suspect due to uncontrolled HTN - NYHA Hays II - euvolemic today - weighing daily; reminded to call for an overnight weight gain of > 2 pounds or a weekly weight gain of > 5 pounds - weight 253 pounds from last visit here 2 months ago - Echo 11/30/20: EF 55-60% without LVH. - Echo 11/21/21: EF 55-60% along with mild LVH and mild/moderate LAE - Echo 06/03/22: EF 50-55% along with moderate LVH and mild LAE/RAE.  - not adding salt; reviewed the importance of reading food labels to keep her daily sodium intake to 2000mg  / day - trying to increase her activity and is walking daily at home and then going to the gym every other day - saw cardiology Dene) 01/24 - continue carvedilol  25mg  BID - continue hydralazine  100mg  TID - continue entresto  49/51 mg BID - continue torsemide  40mg  daily - continue spironolactone  25mg  daily - BNP 06/05/22 was 149.6  2: HTN- - BP  - saw PCP at [facility]  - BMP 10/06/23 reviewed: sodium 144, potassium 4.3, creatinine 1.32 and GFR 48 - BMET today - saw nephrology (Korrapati) 05/25  3: OSA- - has had previous sleep study 08/24  4: Pulmonary HTN- - RHC done 11/25/21:  Hemodynamic findings consistent with severe pulmonary  hypertension.  Successful right heart catheterization via the right antecubital vein. This showed evidence of severely elevated right and left-sided filling pressures, severe pulmonary hypertension and normal cardiac output.  RA: 22 mmHg RV: 92/18 with an end-diastolic pressure of 32 mmHg. PA: 95/44 with a mean of 64 mmHg PCW: 30 mmHg  Cardiac output: 7.4 with an index of 3.16. Pulmonary vascular resistance: 4.5 Woods units     Sheila Hays, [place] 12/08/23

## 2023-12-09 ENCOUNTER — Encounter: Admitting: Family

## 2023-12-29 NOTE — Procedures (Signed)
 Per appt notes: needs repeat study.

## 2023-12-31 ENCOUNTER — Other Ambulatory Visit: Payer: Self-pay | Admitting: Internal Medicine

## 2023-12-31 DIAGNOSIS — Z1231 Encounter for screening mammogram for malignant neoplasm of breast: Secondary | ICD-10-CM

## 2024-01-21 ENCOUNTER — Observation Stay
Admission: EM | Admit: 2024-01-21 | Discharge: 2024-01-22 | Disposition: A | Discharge status: Home / Self Care | Attending: Internal Medicine | Admitting: Internal Medicine

## 2024-01-21 ENCOUNTER — Emergency Department

## 2024-01-21 ENCOUNTER — Encounter: Payer: Self-pay | Admitting: Internal Medicine

## 2024-01-21 ENCOUNTER — Telehealth: Payer: Self-pay | Admitting: Family

## 2024-01-21 ENCOUNTER — Other Ambulatory Visit: Payer: Self-pay

## 2024-01-21 DIAGNOSIS — G40909 Epilepsy, unspecified, not intractable, without status epilepticus: Principal | ICD-10-CM | POA: Insufficient documentation

## 2024-01-21 DIAGNOSIS — R569 Unspecified convulsions: Secondary | ICD-10-CM | POA: Diagnosis not present

## 2024-01-21 DIAGNOSIS — I5033 Acute on chronic diastolic (congestive) heart failure: Secondary | ICD-10-CM | POA: Insufficient documentation

## 2024-01-21 DIAGNOSIS — Z7982 Long term (current) use of aspirin: Secondary | ICD-10-CM | POA: Insufficient documentation

## 2024-01-21 DIAGNOSIS — Z6838 Body mass index (BMI) 38.0-38.9, adult: Secondary | ICD-10-CM | POA: Diagnosis not present

## 2024-01-21 DIAGNOSIS — J449 Chronic obstructive pulmonary disease, unspecified: Secondary | ICD-10-CM | POA: Diagnosis not present

## 2024-01-21 DIAGNOSIS — D631 Anemia in chronic kidney disease: Secondary | ICD-10-CM | POA: Diagnosis not present

## 2024-01-21 DIAGNOSIS — I130 Hypertensive heart and chronic kidney disease with heart failure and stage 1 through stage 4 chronic kidney disease, or unspecified chronic kidney disease: Secondary | ICD-10-CM | POA: Diagnosis not present

## 2024-01-21 DIAGNOSIS — R079 Chest pain, unspecified: Secondary | ICD-10-CM | POA: Diagnosis present

## 2024-01-21 DIAGNOSIS — N1831 Chronic kidney disease, stage 3a: Secondary | ICD-10-CM | POA: Insufficient documentation

## 2024-01-21 LAB — BASIC METABOLIC PANEL WITH GFR
Anion gap: 11 (ref 5–15)
BUN: 24 mg/dL — ABNORMAL HIGH (ref 6–20)
CO2: 26 mmol/L (ref 22–32)
Calcium: 9.3 mg/dL (ref 8.9–10.3)
Chloride: 106 mmol/L (ref 98–111)
Creatinine, Ser: 1.46 mg/dL — ABNORMAL HIGH (ref 0.44–1.00)
GFR, Estimated: 42 mL/min — ABNORMAL LOW (ref >60)
Glucose, Bld: 93 mg/dL (ref 70–99)
Potassium: 3.9 mmol/L (ref 3.5–5.1)
Sodium: 143 mmol/L (ref 135–145)

## 2024-01-21 LAB — CBC
HCT: 35.5 % — ABNORMAL LOW (ref 36.0–46.0)
Hemoglobin: 10.7 g/dL — ABNORMAL LOW (ref 12.0–15.0)
MCH: 22.2 pg — ABNORMAL LOW (ref 26.0–34.0)
MCHC: 30.1 g/dL (ref 30.0–36.0)
MCV: 73.8 fL — ABNORMAL LOW (ref 80.0–100.0)
Platelets: 199 K/uL (ref 150–400)
RBC: 4.81 MIL/uL (ref 3.87–5.11)
RDW: 22.9 % — ABNORMAL HIGH (ref 11.5–15.5)
WBC: 4.5 K/uL (ref 4.0–10.5)
nRBC: 0 % (ref 0.0–0.2)

## 2024-01-21 LAB — IRON AND TIBC
Iron: 36 ug/dL (ref 28–170)
Saturation Ratios: 8 % — ABNORMAL LOW (ref 10.4–31.8)
TIBC: 483 ug/dL — ABNORMAL HIGH (ref 250–450)
UIBC: 447 ug/dL

## 2024-01-21 LAB — RETICULOCYTES
Immature Retic Fract: 23.6 % — ABNORMAL HIGH (ref 2.3–15.9)
RBC.: 4.79 MIL/uL (ref 3.87–5.11)
Retic Count, Absolute: 80.5 K/uL (ref 19.0–186.0)
Retic Ct Pct: 1.7 % (ref 0.4–3.1)

## 2024-01-21 LAB — TROPONIN I (HIGH SENSITIVITY)
Troponin I (High Sensitivity): 21 ng/L — ABNORMAL HIGH (ref <18)
Troponin I (High Sensitivity): 23 ng/L — ABNORMAL HIGH (ref <18)

## 2024-01-21 LAB — BRAIN NATRIURETIC PEPTIDE: B Natriuretic Peptide: 1485.3 pg/mL — ABNORMAL HIGH (ref 0.0–100.0)

## 2024-01-21 LAB — FERRITIN: Ferritin: 18 ng/mL (ref 11–307)

## 2024-01-21 MED ORDER — ASPIRIN 81 MG PO TBEC
81.0000 mg | DELAYED_RELEASE_TABLET | Freq: Every day | ORAL | Status: DC
  Administered 2024-01-21 – 2024-01-22 (×2): 81 mg via ORAL
  Filled 2024-01-21 (×2): qty 1

## 2024-01-21 MED ORDER — LEVETIRACETAM 500 MG PO TABS
500.0000 mg | ORAL_TABLET | Freq: Two times a day (BID) | ORAL | Status: DC
  Administered 2024-01-21 – 2024-01-22 (×2): 500 mg via ORAL
  Filled 2024-01-21 (×2): qty 1

## 2024-01-21 MED ORDER — ENSURE PLUS HIGH PROTEIN PO LIQD: 237.0000 mL | Freq: Two times a day (BID) | ORAL | Status: DC

## 2024-01-21 MED ORDER — ROSUVASTATIN CALCIUM 20 MG PO TABS
20.0000 mg | ORAL_TABLET | Freq: Every day | ORAL | Status: DC
  Administered 2024-01-21: 20 mg via ORAL
  Filled 2024-01-21 (×2): qty 1

## 2024-01-21 MED ORDER — ACETAMINOPHEN 500 MG PO TABS: 1000.0000 mg | ORAL_TABLET | Freq: Once | ORAL | Status: DC

## 2024-01-21 MED ORDER — TRAZODONE HCL 100 MG PO TABS: 100.0000 mg | ORAL_TABLET | Freq: Every evening | ORAL | Status: DC | PRN

## 2024-01-21 MED ORDER — SODIUM CHLORIDE 0.9% FLUSH: 3.0000 mL | INTRAVENOUS | Status: DC | PRN

## 2024-01-21 MED ORDER — OXCARBAZEPINE 300 MG PO TABS
600.0000 mg | ORAL_TABLET | Freq: Two times a day (BID) | ORAL | Status: DC
  Administered 2024-01-21 – 2024-01-22 (×2): 600 mg via ORAL
  Filled 2024-01-21 (×4): qty 2

## 2024-01-21 MED ORDER — ACETAMINOPHEN 325 MG PO TABS
650.0000 mg | ORAL_TABLET | ORAL | Status: DC | PRN
  Administered 2024-01-21 – 2024-01-22 (×3): 650 mg via ORAL
  Filled 2024-01-21 (×3): qty 2

## 2024-01-21 MED ORDER — SPIRONOLACTONE 25 MG PO TABS
25.0000 mg | ORAL_TABLET | Freq: Every day | ORAL | Status: DC
  Administered 2024-01-22: 25 mg via ORAL
  Filled 2024-01-21: qty 1

## 2024-01-21 MED ORDER — KETOROLAC TROMETHAMINE 15 MG/ML IJ SOLN
15.0000 mg | Freq: Once | INTRAMUSCULAR | Status: AC
  Administered 2024-01-21: 15 mg via INTRAVENOUS
  Filled 2024-01-21: qty 1

## 2024-01-21 MED ORDER — SODIUM CHLORIDE 0.9 % IV SOLN: 75.0000 mL/h | INTRAVENOUS | Status: DC

## 2024-01-21 MED ORDER — CARVEDILOL 25 MG PO TABS
25.0000 mg | ORAL_TABLET | Freq: Two times a day (BID) | ORAL | Status: DC
  Administered 2024-01-22 (×2): 25 mg via ORAL
  Filled 2024-01-21 (×2): qty 1

## 2024-01-21 MED ORDER — ONDANSETRON HCL 4 MG/2ML IJ SOLN: 4.0000 mg | Freq: Four times a day (QID) | INTRAMUSCULAR | Status: DC | PRN

## 2024-01-21 MED ORDER — ENOXAPARIN SODIUM 40 MG/0.4ML IJ SOSY: 40.0000 mg | PREFILLED_SYRINGE | INTRAMUSCULAR | Status: DC

## 2024-01-21 MED ORDER — MONTELUKAST SODIUM 10 MG PO TABS
10.0000 mg | ORAL_TABLET | Freq: Every day | ORAL | Status: DC
  Administered 2024-01-21: 10 mg via ORAL
  Filled 2024-01-21: qty 1

## 2024-01-21 MED ORDER — SACUBITRIL-VALSARTAN 49-51 MG PO TABS
1.0000 | ORAL_TABLET | Freq: Two times a day (BID) | ORAL | Status: DC
  Administered 2024-01-21 – 2024-01-22 (×2): 1 via ORAL
  Filled 2024-01-21 (×4): qty 1

## 2024-01-21 MED ORDER — LINACLOTIDE 145 MCG PO CAPS: 145.0000 ug | ORAL_CAPSULE | Freq: Every day | ORAL | Status: DC

## 2024-01-21 MED ORDER — FUROSEMIDE 10 MG/ML IJ SOLN
40.0000 mg | Freq: Two times a day (BID) | INTRAMUSCULAR | Status: DC
  Administered 2024-01-21 – 2024-01-22 (×3): 40 mg via INTRAVENOUS
  Filled 2024-01-21 (×3): qty 4

## 2024-01-21 MED ORDER — HYDRALAZINE HCL 50 MG PO TABS
100.0000 mg | ORAL_TABLET | Freq: Three times a day (TID) | ORAL | Status: DC
  Administered 2024-01-21 – 2024-01-22 (×3): 100 mg via ORAL
  Filled 2024-01-21 (×3): qty 2

## 2024-01-21 MED ORDER — LORAZEPAM 2 MG/ML IJ SOLN
1.0000 mg | Freq: Once | INTRAMUSCULAR | Status: AC
  Administered 2024-01-21: 1 mg via INTRAVENOUS
  Filled 2024-01-21: qty 1

## 2024-01-21 MED ORDER — HYDRALAZINE HCL 20 MG/ML IJ SOLN
5.0000 mg | Freq: Four times a day (QID) | INTRAMUSCULAR | Status: DC | PRN
  Administered 2024-01-21: 5 mg via INTRAVENOUS
  Filled 2024-01-21: qty 1

## 2024-01-21 MED ORDER — ORAL CARE MOUTH RINSE: 15.0000 mL | OROMUCOSAL | Status: DC | PRN

## 2024-01-21 MED ORDER — ORAL CARE MOUTH RINSE
15.0000 mL | OROMUCOSAL | Status: DC
  Administered 2024-01-21 – 2024-01-22 (×5): 15 mL via OROMUCOSAL
  Filled 2024-01-21 (×6): qty 15

## 2024-01-21 MED ORDER — LEVETIRACETAM (KEPPRA) 500 MG/5 ML ADULT IV PUSH
1500.0000 mg | Freq: Once | INTRAVENOUS | Status: AC
  Administered 2024-01-21: 1500 mg via INTRAVENOUS
  Filled 2024-01-21: qty 15

## 2024-01-21 MED ORDER — ALBUTEROL SULFATE (2.5 MG/3ML) 0.083% IN NEBU
2.5000 mg | INHALATION_SOLUTION | RESPIRATORY_TRACT | Status: DC | PRN
  Filled 2024-01-21: qty 3

## 2024-01-21 MED ORDER — ENOXAPARIN SODIUM 60 MG/0.6ML IJ SOSY: 55.0000 mg | PREFILLED_SYRINGE | INTRAMUSCULAR | Status: DC

## 2024-01-21 MED ORDER — SODIUM CHLORIDE 0.9% FLUSH
3.0000 mL | Freq: Two times a day (BID) | INTRAVENOUS | Status: DC
  Administered 2024-01-22 (×2): 3 mL via INTRAVENOUS

## 2024-01-21 MED ORDER — SODIUM CHLORIDE 0.9 % IV SOLN: 250.0000 mL | INTRAVENOUS | Status: AC | PRN

## 2024-01-21 NOTE — Progress Notes (Signed)
 Patient arrived to floor alert and orientated x 4, no distress noted made comfortable I bed. Cal bell given I.v intact

## 2024-01-21 NOTE — H&P (Addendum)
 History and Physical    Sheila Hays FMW:[id number] DOB: [birth year] DOA: 01/21/2024  PCP: Sampson Ethridge LABOR, MD (Confirm with patient/family/NH records and if not entered, this has to be entered at [facility] point of entry) Patient coming from: Home  I have personally briefly reviewed patient's old medical records in [facility]  Chief Complaint: Chest pain, SOB, cough, seizure  HPI: Sheila Hays is a 53 y.o. female with medical history significant of chronic HFpEF, HTN, seizure disorder, COPD, CKD stage IIIb, morbid obesity, untreated OSA, severe pulmonary hypertension, presented with multiple complaints including chest pain, shortness of breath, and breakthrough seizure.  Patient reported left she started to feel short of breath and productive cough of clear phlegm 2 days ago, she attributed to  broken sewage and kitchen sink with all the waste backed up and stinks and she also has had orthopnea and could not sleep on the flat bed for last 2 nights.  Denied any peripheral edema.  Last night, while watching TV family member found patient suddenly staring right forward and not responsive for about 3 to 5 minutes and then recovered back to baseline.  Patient does not remember what has happened.  Denied any body arm or legs shaking or twitching, no loss control of urine or bowel movement and no tongue or lip biting.  Overnight patient could not sleep because of shortness of breath and developed tightness in the chest worsening with deep breaths, this morning she had another episode of staring and unresponsive lasted about 5 minutes and this time family called EMS.  She reported that she is compliant with all her BP/CHF and seizure medications.  No fever or chills.  She also complained about global headache but no vision changes no feeling nauseous vomiting.  EMS arrived and found patient blood pressure 214/100, 1 spray of nitroglycerin  given and blood pressure repeated showed 190/90.   Patient reports chest pain as tightness like, 6/10 associated with worsening shortness of breath, much improved after blood pressure came down. ED Course: Afebrile, no tachycardia blood pressure 170/85 O2 saturation 100% on room air.  Chest x-ray showed pulmonary congestion CT head negative for acute findings.  Blood work showed hemoglobin 10.7 compared to baseline 12 BUN 24 creatinine 1.4 WBC 12.5.  Patient was given 1 dose of Keppra  loading in the ED.  Review of Systems: As per HPI otherwise 14 point review of systems negative.    Past Medical History:  Diagnosis Date   Anemia    CHF (congestive heart failure) (HCC)    Chronic kidney disease (CKD), stage III (moderate) (HCC)    COPD (chronic obstructive pulmonary disease) (HCC)    GERD (gastroesophageal reflux disease)    Hyperlipidemia    Hypertension    Seizures (HCC)    [facility] at [facility]   Sleep apnea     Past Surgical History:  Procedure Laterality Date   RIGHT HEART CATH N/A 11/25/2021   Procedure: RIGHT HEART CATH;  Surgeon: Darron Deatrice LABOR, MD;  Location: [facility] INVASIVE CV LAB;  Service: Cardiovascular;  Laterality: N/A;     reports that she quit smoking about 7 years ago. Her smoking use included cigarettes. She started smoking about 14 years ago. She has a 3.5 pack-year smoking history. She quit smokeless tobacco use about 16 years ago. She reports that she does not currently use alcohol. She reports that she does not use drugs.  Allergies  Allergen Reactions   Ciprofloxacin Itching    Family History  Problem Relation Age of Onset   Hypertension Mother    Hypertension Father    Heart failure Sister    Diabetes Sister    Seizures Sister    Breast cancer Neg Hx      Prior to Admission medications   Medication Sig Start Date End Date Taking? Authorizing Provider  acetaminophen  (TYLENOL ) 500 MG tablet Take 500 mg by mouth every 8 (eight) hours as needed for mild pain.    [provider]   Albuterol -Budesonide  (AIRSUPRA ) 90-80 MCG/ACT AERO Inhale 2 puffs into the lungs every 4 (four) hours as needed. 07/04/22   Darlean Ozell NOVAK, MD  aspirin  EC 81 MG tablet Take 81 mg by mouth daily.    [provider]  budesonide -formoterol  (SYMBICORT ) 80-4.5 MCG/ACT inhaler Take 2 puffs first thing in am and then another 2 puffs about 12 hours later. 07/04/22   Darlean Ozell NOVAK, MD  carvedilol  (COREG ) 25 MG tablet Take 1 tablet (25 mg total) by mouth 2 (two) times daily. 07/18/22   Donette Ellouise LABOR, FNP  hydrALAZINE  (APRESOLINE ) 25 MG tablet Take 1 tablet (25 mg total) by mouth 3 (three) times daily. Patient taking differently: Take 100 mg by mouth 3 (three) times daily. 06/07/22   Hongalgi, Anand D, MD  ipratropium-albuterol  (DUONEB) 0.5-2.5 (3) MG/3ML SOLN Take 3 mLs by nebulization every 6 (six) hours as needed. 11/20/21   [provider]  LINZESS  145 MCG CAPS capsule Take 145 mcg by mouth daily. 07/14/22   [provider]  montelukast  (SINGULAIR ) 10 MG tablet Take 10 mg by mouth daily. 11/06/21   [provider]  oxcarbazepine  (TRILEPTAL ) 600 MG tablet Take 600 mg by mouth 2 (two) times daily.    [provider]  rosuvastatin  (CRESTOR ) 20 MG tablet Take 20 mg by mouth at bedtime. 12/10/21   [provider]  sacubitril -valsartan  (ENTRESTO ) 49-51 MG Take 1 tablet by mouth 2 (two) times daily. 02/24/23   Donette Ellouise LABOR, FNP  spironolactone  (ALDACTONE ) 25 MG tablet Take 1 tablet (25 mg total) by mouth daily. 07/31/23 10/29/23  Donette Ellouise LABOR, FNP  torsemide  (DEMADEX ) 20 MG tablet Take 2 tablets (40 mg total) by mouth daily. 10/07/23   Donette Ellouise LABOR, FNP  traZODone  (DESYREL ) 100 MG tablet Take 100 mg by mouth at bedtime as needed for sleep.    [provider]  vitamin C (ASCORBIC ACID ) 500 MG tablet Take 500 mg by mouth daily.    [provider]    Physical Exam: Vitals:   01/21/24 1340 01/21/24 1341 01/21/24 1348 01/21/24 1500  BP: (!)  173/85   (!) 160/74  Pulse: 67   64  Resp: 16   16  Temp: 98 F (36.7 C)     TempSrc: Oral     SpO2: 100%  95% 96%  Weight:  107.5 kg    Height:  5' 6 (1.676 m)      Constitutional: NAD, calm, comfortable Vitals:   01/21/24 1340 01/21/24 1341 01/21/24 1348 01/21/24 1500  BP: (!) 173/85   (!) 160/74  Pulse: 67   64  Resp: 16   16  Temp: 98 F (36.7 C)     TempSrc: Oral     SpO2: 100%  95% 96%  Weight:  107.5 kg    Height:  5' 6 (1.676 m)     Eyes: PERRL, lids and conjunctivae normal ENMT: Mucous membranes are moist. Posterior pharynx clear of any exudate or lesions.Normal dentition.  Neck: normal,  supple, no masses, no thyromegaly Respiratory: clear to auscultation bilaterally, no wheezing, fine crackles to bilateral knee levels, increasing respiratory effort. No accessory muscle use.  Cardiovascular: Regular rate and rhythm, no murmurs / rubs / gallops.  Trace extremity edema. 2+ pedal pulses. No carotid bruits.  Abdomen: no tenderness, no masses palpated. No hepatosplenomegaly. Bowel sounds positive.  Musculoskeletal: no clubbing / cyanosis. No joint deformity upper and lower extremities. Good ROM, no contractures. Normal muscle tone.  Skin: no rashes, lesions, ulcers. No induration Neurologic: CN 2-12 grossly intact. Sensation intact, DTR normal. Strength 5/5 in all 4.  Psychiatric: Normal judgment and insight. Alert and oriented x 3. Normal mood.     Labs on Admission: I have personally reviewed following labs and imaging studies  CBC: Recent Labs  Lab 01/21/24 1343  WBC 4.5  HGB 10.7*  HCT 35.5*  MCV 73.8*  PLT 199   Basic Metabolic Panel: Recent Labs  Lab 01/21/24 1343  NA 143  K 3.9  CL 106  CO2 26  GLUCOSE 93  BUN 24*  CREATININE 1.46*  CALCIUM  9.3   GFR: Estimated Creatinine Clearance: 54 mL/min (A) (by C-G formula based on SCr of 1.46 mg/dL (H)). Liver Function Tests: No results for input(s): AST, ALT, ALKPHOS, BILITOT, PROT,  ALBUMIN in the last 168 hours. No results for input(s): LIPASE, AMYLASE in the last 168 hours. No results for input(s): AMMONIA in the last 168 hours. Coagulation Profile: No results for input(s): INR, PROTIME in the last 168 hours. Cardiac Enzymes: No results for input(s): CKTOTAL, CKMB, CKMBINDEX, TROPONINI in the last 168 hours. BNP (last 3 results) Recent Labs    10/06/23 1541  PROBNP 3,078*   HbA1C: No results for input(s): HGBA1C in the last 72 hours. CBG: No results for input(s): GLUCAP in the last 168 hours. Lipid Profile: No results for input(s): CHOL, HDL, LDLCALC, TRIG, CHOLHDL, LDLDIRECT in the last 72 hours. Thyroid Function Tests: No results for input(s): TSH, T4TOTAL, FREET4, T3FREE, THYROIDAB in the last 72 hours. Anemia Panel: No results for input(s): VITAMINB12, FOLATE, FERRITIN, TIBC, IRON, RETICCTPCT in the last 72 hours. Urine analysis:    Component Value Date/Time   COLORURINE YELLOW (A) 06/14/2021 1830   APPEARANCEUR CLEAR (A) 06/14/2021 1830   LABSPEC 1.011 06/14/2021 1830   PHURINE 5.0 06/14/2021 1830   GLUCOSEU NEGATIVE 06/14/2021 1830   HGBUR NEGATIVE 06/14/2021 1830   BILIRUBINUR NEGATIVE 06/14/2021 1830   KETONESUR NEGATIVE 06/14/2021 1830   PROTEINUR 100 (A) 06/14/2021 1830   NITRITE NEGATIVE 06/14/2021 1830   LEUKOCYTESUR LARGE (A) 06/14/2021 1830    Radiological Exams on Admission: CT Head Wo Contrast Result Date: 01/21/2024 CLINICAL DATA:  Seizure disorder, clinical change EXAM: CT HEAD WITHOUT CONTRAST TECHNIQUE: Contiguous axial images were obtained from the base of the skull through the vertex without intravenous contrast. RADIATION DOSE REDUCTION: This exam was performed according to the departmental dose-optimization program which includes automated exposure control, adjustment of the mA and/or kV according to patient size and/or use of iterative reconstruction technique.  COMPARISON:  December 01, 2020, November 30, 2020 FINDINGS: Brain: The ventricles appear age appropriate. No mass effect or midline shift. Gray-white differentiation is preserved without focal attenuation abnormality.No evidence of acute territorial infarction, extra-axial fluid collection, hemorrhage, or mass lesion. Redemonstrated empty sella again noted. The basilar cisterns are patent without downward herniation. The cerebellar hemispheres and vermis are well formed without mass lesion or focal attenuation abnormality. Vascular: No hyperdense vessel. Calcified atherosclerotic plaque within the cavernous/supraclinoid  internal carotid arteries. Skull: Normal. Negative for fracture or focal lesion. Sinuses/Orbits: The paranasal sinuses and mastoids are clear.The globes appear intact. No retrobulbar hematoma. Other: None. IMPRESSION: No acute intracranial abnormality, specifically, no acute hemorrhage, territorial infarction, or intracranial mass. Electronically Signed   By: Rogelia Myers M.D.   On: 01/21/2024 16:02   DG Chest 2 View Result Date: 01/21/2024 CLINICAL DATA:  Chest pain EXAM: CHEST - 2 VIEW COMPARISON:  07/10/2023 FINDINGS: Cardiomegaly, vascular congestion. Interstitial prominence likely reflects interstitial edema. No effusions or acute bony abnormality. IMPRESSION: Cardiomegaly with vascular congestion and probable early interstitial edema. Electronically Signed   By: Franky Crease M.D.   On: 01/21/2024 14:17    EKG: Independently reviewed.  Sinus rhythm, chronic QRS changes on V1-V3, chronic.  No acute ST changes. Assessment/Plan Principal Problem:   Seizure (HCC) Active Problems:   Acute on chronic diastolic CHF (congestive heart failure) (HCC)  (please populate well all problems here in Problem List. (For example, if patient is on BP meds at home and you resume or decide to hold them, it is a problem that needs to be her. Same for CAD, COPD, HLD and so on)  Breakthrough seizure Hx of  absence seizure - Clinically suspect breakthrough seizure triggered by acute CHF decompensation/HTN emergency - Continue home Trileptal  regimen - Routine EEG - Seizure precaution  Acute on chronic HFpEF decompensation - Significant fluid overload and respiratory distress - Start IV Lasix  40 mg twice daily, monitoring kidney function response - Echocardiogram - Resume home BP meds including Coreg , hydralazine , spironolactone  and Entresto   Anginal-like chest pain - Secondary to HTN emergency and CHF decompensation - Troponin trending is flat, implying demanding ischemia - Echocardiogram -Other Ddx, B/L pulses equal, low suspicion for dissection. Hold off further image study.  HTN emergency - With signs of acute endorgan damage of acute CHF decompensation and angina - Management as above  CKD stage IIIa - Creatinine level stable, - Severe volume overload on physical exam and x-ray, diuresis as above  COPD - Stable, her breathing symptoms symptoms are likely caused by CHF decompensation - Continue ICS and LABA, continue as needed breathing breath  Morbid obesity Untreated OSA Severe pulmonary hypertension -BMI= 38 -Check nocturnal pulse ox - Outpatient GLP-1 agonist evaluation  Chronic microcytic anemia - Denied any GI bleed history - Check iron study and reticulocyte count  Total time spent on patient care 75 minutes.  DVT prophylaxis: Lovenox  Code Status: Full code Family Communication: None at bedside Disposition Plan: Expect less than 2 midnight hospital stay Consults called: None Admission status: Telemetry observation   Cort ONEIDA Mana MD [facility] Pager [phone]  01/21/2024, 5:11 PM

## 2024-01-21 NOTE — ED Triage Notes (Signed)
 Pt BIB [facility] for chest pain and SOB that started last night. The patient was reported to have had a seizure witnessed by family last night however patient did not seek medical care at that time. Patient was found by EMS to be HTN 214/100 and 1 spray NTG admin with a repeat of 190/90. Patient in no distress at triage with c/o chest pain rated 6/10.

## 2024-01-21 NOTE — Progress Notes (Signed)
 PHARMACIST - PHYSICIAN COMMUNICATION  CONCERNING:  Enoxaparin  (Lovenox ) for DVT Prophylaxis    RECOMMENDATION: Patient was prescribed enoxaprin 40mg  q24 hours for VTE prophylaxis.   Filed Weights   01/21/24 1341  Weight: 107.5 kg (237 lb)    Body mass index is 38.25 kg/m.  Estimated Creatinine Clearance: 54 mL/min (A) (by C-G formula based on SCr of 1.46 mg/dL (H)).   Based on [facility] policy patient is candidate for enoxaparin  0.5mg /kg TBW SQ every 24 hours based on BMI being >30.  DESCRIPTION: Pharmacy has adjusted enoxaparin  dose per [facility] policy.  Patient is now receiving enoxaparin  55 mg every 24 hours    Lum VEAR Mania, PharmD Clinical Pharmacist  01/21/2024 5:14 PM

## 2024-01-21 NOTE — ED Provider Notes (Signed)
 [facility] Provider Note    Event Date/Time   First MD Initiated Contact with Patient 01/21/24 1347     (approximate)   History   Chest Pain   HPI  Ranetta Armacost is a 53 y.o. female with a history of CHF, CKD, hypertension, GERD, and seizure disorder who presents with seizures and chest pain.  The patient states that she had a seizure witnessed by her family last night but did not seek medical care at that time.  Subsequently today the patient developed pressure-like substernal chest pain.  She was found to be significantly hypertensive by EMS and was given a spray of nitroglycerin .  The patient is still having some chest pain but it is improved.  She believes she had another seizure while in the ED.  I reviewed the past medical records.  The patient's most recent outpatient encounter was on 5/20 of this year with nephrology for follow-up of her CKD.  The patient is not sure what seizure medications she is on.  The most recent neurology visit I can see is with Dr. Lane from 2022, and at that time her seizure medications were listed as Lamictal , Trileptal , and Keppra , although these are not on her current list of outpatient medications.   Physical Exam   Triage Vital Signs: ED Triage Vitals  Encounter Vitals Group     BP 01/21/24 1340 (!) 173/85     Girls Systolic BP Percentile --      Girls Diastolic BP Percentile --      Boys Systolic BP Percentile --      Boys Diastolic BP Percentile --      Pulse Rate 01/21/24 1340 67     Resp 01/21/24 1340 16     Temp 01/21/24 1340 98 F (36.7 C)     Temp Source 01/21/24 1340 Oral     SpO2 01/21/24 1336 98 %     Weight 01/21/24 1341 237 lb (107.5 kg)     Height 01/21/24 1341 5' 6 (1.676 m)     Head Circumference --      Peak Flow --      Pain Score 01/21/24 1341 6     Pain Loc --      Pain Education --      Exclude from Growth Chart --     Most recent vital signs: Vitals:   01/21/24 1340 01/21/24  1348  BP: (!) 173/85   Pulse: 67   Resp: 16   Temp: 98 F (36.7 C)   SpO2: 100% 95%     General: Alert and oriented, no distress.  CV:  Good peripheral perfusion.  Normal heart sounds. Resp:  Normal effort.  Lungs CTAB. Abd:  No distention.  Other:  EOMI.  PERRLA.  No photophobia.  No facial droop.  Normal speech.  Motor intact in all extremities.  No ataxia.  No pronator drift.  No peripheral edema.   ED Results / Procedures / Treatments   Labs (all labs ordered are listed, but only abnormal results are displayed) Labs Reviewed  BASIC METABOLIC PANEL WITH GFR - Abnormal; Notable for the following components:      Result Value   BUN 24 (*)    Creatinine, Ser 1.46 (*)    GFR, Estimated 42 (*)    All other components within normal limits  CBC - Abnormal; Notable for the following components:   Hemoglobin 10.7 (*)    HCT 35.5 (*)  MCV 73.8 (*)    MCH 22.2 (*)    RDW 22.9 (*)    All other components within normal limits  BRAIN NATRIURETIC PEPTIDE - Abnormal; Notable for the following components:   B Natriuretic Peptide 1,485.3 (*)    All other components within normal limits  TROPONIN I (HIGH SENSITIVITY) - Abnormal; Notable for the following components:   Troponin I (High Sensitivity) 21 (*)    All other components within normal limits  LEVETIRACETAM  LEVEL  TROPONIN I (HIGH SENSITIVITY)     EKG  ED ECG REPORT I, Waylon Cassis, the attending physician, personally viewed and interpreted this ECG.  Date: 01/21/2024 EKG Time: 1340 Rate: 67 Rhythm: normal sinus rhythm QRS Axis: normal Intervals: normal ST/T Wave abnormalities: normal Narrative Interpretation: no evidence of acute ischemia   RADIOLOGY  Chest x-ray: I independently viewed and interpreted the images; there are bilateral interstitial opacities consistent with edema.  CT head: Pending  PROCEDURES:  Critical Care performed: No  Procedures   MEDICATIONS ORDERED IN ED: Medications   levETIRAcetam  (KEPPRA ) undiluted injection 1,500 mg (1,500 mg Intravenous Given 01/21/24 1444)  LORazepam  (ATIVAN ) injection 1 mg (1 mg Intravenous Given 01/21/24 1442)     IMPRESSION / MDM / ASSESSMENT AND PLAN / ED COURSE  I reviewed the triage vital signs and the nursing notes.  53 year old female with PMH as noted above presents with possible seizures as well as chest pain and shortness of breath.  On exam her vital signs are normal except for hypertension which is improved from when she was seen by EMS.  Neurologic exam is nonfocal.  However, when I first entered the room the patient was staring ahead with fixed gaze, appeared somewhat rigid, and was not responding to me calling to her.  This lasted for approximately 15 seconds.  She then started responding to me normally and said that she thought she might of had a seizure.  Differential diagnosis includes, but is not limited to, epileptic seizures, pseudoseizures, muscle spasm, ACS, musculoskeletal chest pain, CHF.  We will obtain CT head given the change in seizure pattern, chest x-ray, lab workup including cardiac enzymes.  We will give IV Keppra  and Ativan  and reassess.  Patient's presentation is most consistent with acute presentation with potential threat to life or bodily function.  The patient is on the cardiac monitor to evaluate for evidence of arrhythmia and/or significant heart rate changes.  ----------------------------------------- 3:26 PM on 01/21/2024 -----------------------------------------  CT head is pending.  BMP and CBC show no acute abnormalities.  Initial troponin is minimally elevated.  The patient has not had further seizure activity.  I consulted and discussed the case with Dr. Lindzen from neurology.  The patient now states that she is on Trileptal  for seizures and the other medications have been discontinued.  However, she has been compliant with the Trileptal .  She likely will need admission.  I have signed her  out to the oncoming ED physician Dr. Viviann.   FINAL CLINICAL IMPRESSION(S) / ED DIAGNOSES   Final diagnoses:  Seizures (HCC)  Nonspecific chest pain     Rx / DC Orders   ED Discharge Orders     None        Note:  This document was prepared using Dragon voice recognition software and may include unintentional dictation errors.    Cassis Waylon, MD 01/21/24 1530

## 2024-01-21 NOTE — Telephone Encounter (Signed)
Called to r/s appt

## 2024-01-22 ENCOUNTER — Observation Stay (HOSPITAL_BASED_OUTPATIENT_CLINIC_OR_DEPARTMENT_OTHER)
Admit: 2024-01-22 | Discharge: 2024-01-22 | Disposition: A | Discharge status: Home / Self Care | Attending: Internal Medicine | Admitting: Internal Medicine

## 2024-01-22 ENCOUNTER — Observation Stay

## 2024-01-22 DIAGNOSIS — I5033 Acute on chronic diastolic (congestive) heart failure: Secondary | ICD-10-CM

## 2024-01-22 DIAGNOSIS — G40909 Epilepsy, unspecified, not intractable, without status epilepticus: Secondary | ICD-10-CM | POA: Diagnosis not present

## 2024-01-22 DIAGNOSIS — R079 Chest pain, unspecified: Secondary | ICD-10-CM | POA: Diagnosis not present

## 2024-01-22 DIAGNOSIS — R569 Unspecified convulsions: Secondary | ICD-10-CM | POA: Diagnosis not present

## 2024-01-22 LAB — LEVETIRACETAM LEVEL: Levetiracetam Lvl: <2 ug/mL — ABNORMAL LOW (ref 10.0–40.0)

## 2024-01-22 LAB — BASIC METABOLIC PANEL WITH GFR
Anion gap: 10 (ref 5–15)
BUN: 24 mg/dL — ABNORMAL HIGH (ref 6–20)
CO2: 28 mmol/L (ref 22–32)
Calcium: 8.8 mg/dL — ABNORMAL LOW (ref 8.9–10.3)
Chloride: 104 mmol/L (ref 98–111)
Creatinine, Ser: 1.44 mg/dL — ABNORMAL HIGH (ref 0.44–1.00)
GFR, Estimated: 43 mL/min — ABNORMAL LOW (ref >60)
Glucose, Bld: 71 mg/dL (ref 70–99)
Potassium: 3.4 mmol/L — ABNORMAL LOW (ref 3.5–5.1)
Sodium: 142 mmol/L (ref 135–145)

## 2024-01-22 LAB — ECHOCARDIOGRAM COMPLETE
AR max vel: 2.24 cm2
AV Area VTI: 2.47 cm2
AV Area mean vel: 2.22 cm2
AV Mean grad: 6 mmHg
AV Peak grad: 11.4 mmHg
Ao pk vel: 1.69 m/s
Area-P 1/2: 3.65 cm2
Calc EF: 68 %
Height: 66 in
MV VTI: 2.32 cm2
S' Lateral: 4 cm
Single Plane A2C EF: 66.1 %
Single Plane A4C EF: 68.3 %
Weight: 4028.25 [oz_av]

## 2024-01-22 LAB — HIV ANTIBODY (ROUTINE TESTING W REFLEX): HIV Screen 4th Generation wRfx: NONREACTIVE

## 2024-01-22 MED ORDER — LEVETIRACETAM 500 MG PO TABS: 500.0000 mg | ORAL_TABLET | Freq: Two times a day (BID) | ORAL | 0 refills | Status: AC

## 2024-01-22 MED ORDER — ADULT MULTIVITAMIN W/MINERALS CH
1.0000 | ORAL_TABLET | Freq: Every day | ORAL | Status: DC
  Administered 2024-01-22: 1 via ORAL
  Filled 2024-01-22: qty 1

## 2024-01-22 MED ORDER — CARVEDILOL 25 MG PO TABS: 25.0000 mg | ORAL_TABLET | Freq: Two times a day (BID) | ORAL | 3 refills | Status: AC

## 2024-01-22 MED ORDER — TORSEMIDE 20 MG PO TABS: 40.0000 mg | ORAL_TABLET | Freq: Every day | ORAL | 0 refills | Status: AC

## 2024-01-22 NOTE — Care Management Obs Status (Signed)
 MEDICARE OBSERVATION STATUS NOTIFICATION   Patient Details  Name: Sheila Hays MRN: [id number] Date of Birth: December 26, 1970   Medicare Observation Status Notification Given:  Yes    Rojelio SHAUNNA Rattler 01/22/2024, 10:57 AM

## 2024-01-22 NOTE — Progress Notes (Signed)
 Orders for overnight pulse ox. RT unable to perform test due to multiple emergency calls. Will attempt to check in and set up overnight unit this evening.

## 2024-01-22 NOTE — Progress Notes (Signed)
 Significant sleep apnea - Pt drops to 83% on room air sitting up in bed while asleep. I put pt on 2L [place] and she came up to 93% on 2L [place].

## 2024-01-22 NOTE — TOC Initial Note (Addendum)
 Transition of Care [facility]) - Initial/Assessment Note    Patient Details  Name: Sheila Hays MRN: [id number] Date of Birth: 11/22/1970  [facility] of [facility]) CM/SW Contact:    [place] L Gerell Fortson, LCSW Phone Number: 01/22/2024, 3:06 PM  Clinical Narrative:                  Oxygen ordered through [facility].   3:35pm: CSW called patient to discuss recommendations for Home Health. Patient declined services. She advised that she is already set up with an agency. She could not remember the name.        Patient Goals and CMS Choice            Expected Discharge Plan and Services                                              Prior Living Arrangements/Services                       Activities of Daily Living   ADL Screening (condition at time of admission) Independently performs ADLs?: No Does the patient have a NEW difficulty with bathing/dressing/toileting/self-feeding that is expected to last >3 days?: Yes (Initiates electronic notice to provider for possible OT consult) Does the patient have a NEW difficulty with getting in/out of bed, walking, or climbing stairs that is expected to last >3 days?: Yes (Initiates electronic notice to provider for possible PT consult) Does the patient have a NEW difficulty with communication that is expected to last >3 days?: No Is the patient deaf or have difficulty hearing?: No Does the patient have difficulty seeing, even when wearing glasses/contacts?: No Does the patient have difficulty concentrating, remembering, or making decisions?: No  Permission Sought/Granted                  Emotional Assessment              Admission diagnosis:  Seizure (HCC) [R56.9] Seizures (HCC) [R56.9] Nonspecific chest pain [R07.9] Patient Active Problem List   Diagnosis Date Noted   Seizure (HCC) 01/21/2024   Asthmatic bronchitis , chronic (HCC) 07/04/2022   Cough variant asthma  vs UACS 07/04/2022   Hypoxia  06/05/2022   Acute congestive heart failure (HCC) 06/05/2022   COPD exacerbation (HCC) 06/05/2022   Stage 3b chronic kidney disease (HCC) 06/02/2022   Influenza A 06/02/2022   Acute on chronic systolic CHF (congestive heart failure) (HCC) 06/02/2022   CAP (community acquired pneumonia) 06/02/2022   Hypertensive urgency 06/02/2022   OSA (obstructive sleep apnea) 06/02/2022   Elevated troponin 06/02/2022   Pulmonary hypertension, unspecified (HCC) 11/25/2021   Hypercapnic acidosis 11/24/2021   Dizziness 11/23/2021   Acute respiratory failure with hypoxia (HCC) 11/23/2021   Chest pain 11/21/2021   Acute on chronic heart failure with preserved ejection fraction (HFpEF) (HCC) 11/20/2021   Morbid obesity (HCC) 11/20/2021   Acute on chronic diastolic CHF (congestive heart failure) (HCC) 11/30/2020   Complex partial seizure disorder (HCC) 11/30/2020   Acute metabolic encephalopathy 11/30/2020   Hypertensive emergency 11/30/2020   Obesity, Class III, BMI 40-49.9 (morbid obesity) 04/27/2018   Essential hypertension    PCP:  Sampson Ethridge LABOR, MD Pharmacy:   [facility] [address], [place] - [address] [place] [address] Phone: [phone] Fax: [phone]  Social Drivers of Health (SDOH) Social History: SDOH Screenings   Food Insecurity: No Food Insecurity (01/21/2024)  Housing: Low Risk  (01/21/2024)  Transportation Needs: No Transportation Needs (01/21/2024)  Utilities: Not At Risk (01/21/2024)  Depression (PHQ2-9): Low Risk  (12/25/2021)  Social Connections: Unknown (01/21/2024)  Tobacco Use: Medium Risk (01/21/2024)   SDOH Interventions:     Readmission Risk Interventions     No data to display

## 2024-01-22 NOTE — Plan of Care (Signed)

## 2024-01-22 NOTE — Progress Notes (Signed)
 Heart Failure Navigator Progress Note  Assessed for Heart & Vascular TOC clinic readiness.  Patient does not meet criteria due to current AHF Team patient of Ellouise Class, FNP.   Next appointment scheduled for 01/26/24 @ 2:00 PM  Navigator will sign off at this time.  Charmaine Pines, RN, BSN [facility] Heart Failure Navigator Secure Chat Only

## 2024-01-22 NOTE — Evaluation (Signed)
 Physical Therapy Evaluation Patient Details Name: Sheila Hays MRN: [id number] DOB: 09/09/1970 Today's Date: 01/22/2024  History of Present Illness  Sheila Hays is a 53 y.o. female with medical history significant of chronic HFpEF, HTN, seizure disorder, COPD, CKD stage IIIb, morbid obesity, untreated OSA, severe pulmonary hypertension, presented with multiple complaints including chest pain, shortness of breath, and breakthrough seizure.  Clinical Impression  Patient admitted with the above. PTA, patient lives with boyfriend on ground floor apartment and is mostly a household ambulator with no AD and HHA for community distances. Able to complete bed mobility modI and sit to stand with CGA. Ambulated 15' and ~25-30' with CGA and no AD with spO2 89-93% on RA throughout. Endorses 5-6 falls in past 6 months due to seizure activity. Patient will benefit from skilled PT services during acute stay to address listed deficits. Patient will benefit from ongoing therapy at discharge to maximize functional independence and safety.       If plan is discharge home, recommend the following: A little help with walking and/or transfers;A little help with bathing/dressing/bathroom;Assistance with cooking/housework;Assist for transportation;Help with stairs or ramp for entrance   Can travel by private vehicle        Equipment Recommendations None recommended by PT  Recommendations for Other Services       Functional Status Assessment Patient has had a recent decline in their functional status and demonstrates the ability to make significant improvements in function in a reasonable and predictable amount of time.     Precautions / Restrictions Precautions Precautions: Fall;Other (comment) (seizure) Restrictions Weight Bearing Restrictions Per Provider Order: No      Mobility  Bed Mobility Overal bed mobility: Modified Independent                  Transfers Overall transfer  level: Needs assistance Equipment used: None Transfers: Sit to/from Stand Sit to Stand: Contact guard assist                Ambulation/Gait Ambulation/Gait assistance: Contact guard assist Gait Distance (Feet): 15 Feet (+25') Assistive device: None, 1 person hand held assist Gait Pattern/deviations: Step-to pattern, Decreased stride length Gait velocity: decreased     General Gait Details: CGA for safety. Complaining of lightheadedness. Seated rest break after 15'. Intermittent HHA, however does not require for support  Stairs            Wheelchair Mobility     Tilt Bed    Modified Rankin (Stroke Patients Only)       Balance Overall balance assessment: Needs assistance Sitting-balance support: No upper extremity supported, Feet supported Sitting balance-Leahy Scale: Normal     Standing balance support: Single extremity supported, During functional activity Standing balance-Leahy Scale: Good                               Pertinent Vitals/Pain Pain Assessment Pain Assessment: No/denies pain    Home Living Family/patient expects to be discharged to:: Private residence Living Arrangements: Spouse/significant other Available Help at Discharge: Family Type of Home: Apartment Home Access: Level entry       Home Layout: One level Home Equipment: None Additional Comments: reports 5-6 falls in 6 months related to seizures    Prior Function Prior Level of Function : Independent/Modified Independent             Mobility Comments: household ambulator no AD, holds boyfriends arm for limited community distances  Extremity/Trunk Assessment   Upper Extremity Assessment Upper Extremity Assessment: Overall WFL for tasks assessed    Lower Extremity Assessment Lower Extremity Assessment: Overall WFL for tasks assessed       Communication   Communication Communication: No apparent difficulties    Cognition Arousal:  Alert Behavior During Therapy: WFL for tasks assessed/performed   PT - Cognitive impairments: No apparent impairments                         Following commands: Intact       Cueing       General Comments General comments (skin integrity, edema, etc.): SpO2 89-93% on RA    Exercises     Assessment/Plan    PT Assessment Patient needs continued PT services  PT Problem List Decreased activity tolerance;Decreased balance;Decreased mobility;Cardiopulmonary status limiting activity       PT Treatment Interventions DME instruction;Gait training;Functional mobility training;Therapeutic activities;Therapeutic exercise;Balance training;Neuromuscular re-education;Patient/family education    PT Goals (Current goals can be found in the Care Plan section)  Acute Rehab PT Goals Patient Stated Goal: to feel better PT Goal Formulation: With patient Time For Goal Achievement: 02/05/24 Potential to Achieve Goals: Good    Frequency Min 1X/week     Co-evaluation PT/OT/SLP Co-Evaluation/Treatment: Yes Reason for Co-Treatment: To address functional/ADL transfers;For patient/therapist safety PT goals addressed during session: Mobility/safety with mobility OT goals addressed during session: ADL's and self-care       AM-PAC PT 6 Clicks Mobility  Outcome Measure Help needed turning from your back to your side while in a flat bed without using bedrails?: A Little Help needed moving from lying on your back to sitting on the side of a flat bed without using bedrails?: A Little Help needed moving to and from a bed to a chair (including a wheelchair)?: A Little Help needed standing up from a chair using your arms (e.g., wheelchair or bedside chair)?: A Little Help needed to walk in hospital room?: A Little Help needed climbing 3-5 steps with a railing? : A Little 6 Click Score: 18    End of Session   Activity Tolerance: Patient tolerated treatment well Patient left: in bed;with  call bell/phone within reach;with bed alarm set Nurse Communication: Mobility status PT Visit Diagnosis: Unsteadiness on feet (R26.81)    Time: 8977-8961 PT Time Calculation (min) (ACUTE ONLY): 16 min   Charges:   PT Evaluation $PT Eval Moderate Complexity: 1 Mod   PT General Charges $$ ACUTE PT VISIT: 1 Visit         Maryanne Finder, PT, DPT Physical Therapist - [facility]  [facility]   Torianne Laflam A Gillermo Poch 01/22/2024, 12:36 PM

## 2024-01-22 NOTE — Discharge Instructions (Signed)

## 2024-01-22 NOTE — Plan of Care (Signed)

## 2024-01-22 NOTE — Progress Notes (Signed)
 Initial Nutrition Assessment  DOCUMENTATION CODES:   Morbid obesity  INTERVENTION:   -MVI with minerals daily -D/c Ensure Plus High Protein po BID, each supplement provides 350 kcal and 20 grams of protein  -Liberalize diet to 2 gram sodium for wider variety of meal selection -Reviewed low sodium diet and CHF management; RD provided Low Sodium Nutrition Therapy handout from [facility] Nutrition Care Manual; attached to AVS/ discharge summary   NUTRITION DIAGNOSIS:   Increased nutrient needs related to chronic illness (CHF) as evidenced by estimated needs.  GOAL:   Patient will meet greater than or equal to 90% of their needs  MONITOR:   PO intake  REASON FOR ASSESSMENT:   Malnutrition Screening Tool    ASSESSMENT:   Pt with medical history significant of chronic HFpEF, HTN, seizure disorder, COPD, CKD stage IIIb, morbid obesity, untreated OSA, severe pulmonary hypertension, presented with multiple complaints including chest pain, shortness of breath, and breakthrough seizure.  Pt admitted with breakthrough seizure and CHF.   Spoke with pt at bedside, who reports feeling okay at time of visit. Pt initially reluctant to engage with RD (texting on phone and not making eye contact, using one word responses), however, pt became more interactive as interview progressed.   Pt reports fair appetite. She consumed most of her breakfast this morning. Pt shares no changes in her appetite. She generally nibbles throughout the day and reports eating a lot of pineapple lately.   Pt reports fluctuating weight, which she attributes to fluid status. Pt reports her UBE is around 247#, but varies based upon home scale and doctor's offices. Wt has been stable over the past year.   Discussed importance of good oral intake to promote healing. Reviewed importance of lowe sodium diet and weight monitoring to manage fluid status.   Obesity is a complex, chronic medical condition that is optimally  managed by a multidisciplinary care team. Weight loss is not an ideal goal for an acute inpatient hospitalization. However, if further work-up for obesity is warranted, consider outpatient referral to [facility].    Medications reviewed and include aldactone .   Labs reviewed: K: 3.4.    NUTRITION - FOCUSED PHYSICAL EXAM:  Flowsheet Row Most Recent Value  Orbital Region No depletion  Upper Arm Region No depletion  Thoracic and Lumbar Region No depletion  Buccal Region No depletion  Temple Region No depletion  Clavicle Bone Region No depletion  Clavicle and Acromion Bone Region No depletion  Scapular Bone Region No depletion  Dorsal Hand No depletion  Patellar Region No depletion  Anterior Thigh Region No depletion  Posterior Calf Region No depletion  Edema (RD Assessment) Mild  Hair Reviewed  Eyes Reviewed  Mouth Reviewed  Skin Reviewed  Nails Reviewed    Diet Order:   Diet Order             Diet 2 gram sodium Fluid consistency: Thin  Diet effective now                   EDUCATION NEEDS:   Education needs have been addressed  Skin:  Skin Assessment: Reviewed RN Assessment  Last BM:  01/22/24  Height:   Ht Readings from Last 1 Encounters:  01/21/24 5' 6 (1.676 m)    Weight:   Wt Readings from Last 1 Encounters:  01/22/24 114.2 kg    Ideal Body Weight:  59.1 kg  BMI:  Body mass index is 40.64 kg/m.  Estimated  Nutritional Needs:   Kcal:  1750-1950  Protein:  90-105 grams  Fluid:  1.7-1.9 L    Margery ORN, RD, LDN, CDCES Registered Dietitian III Certified Diabetes Care and Education Specialist If unable to reach this RD, please use RD Inpatient group chat on secure chat between hours of 8am-4 pm daily

## 2024-01-22 NOTE — Procedures (Signed)
 Patient Name: Marwa Fuhrman  MRN: [id number]  Epilepsy Attending: Arlin MALVA Krebs  Referring Physician/Provider: Laurita Cort DASEN, MD  Date: 01/22/2024 Duration: 31.47 mins  Patient history: 53yo F with breakthrough sz. EEG to evaluate for seizure  Level of alertness: Awake, asleep  AEDs during EEG study: LEV  Technical aspects: This EEG study was done with scalp electrodes positioned according to the 10-20 International system of electrode placement. Electrical activity was reviewed with band pass filter of 1-70Hz , sensitivity of 7 uV/mm, display speed of 47mm/sec with a 60Hz  notched filter applied as appropriate. EEG data were recorded continuously and digitally stored.  Video monitoring was available and reviewed as appropriate.  Description: The posterior dominant rhythm consists of 8-9 Hz activity of moderate voltage (25-35 uV) seen predominantly in posterior head regions, symmetric and reactive to eye opening and eye closing. Sleep was characterized by vertex waves, sleep spindles (12 to 14 Hz), maximal frontocentral region. Hyperventilation and photic stimulation were not performed.     IMPRESSION: This study is within normal limits. No seizures or epileptiform discharges were seen throughout the recording.  A normal interictal EEG does not exclude the diagnosis of epilepsy.   Deaysia Grigoryan O Jasmain Ahlberg

## 2024-01-22 NOTE — Progress Notes (Signed)
 Eeg done

## 2024-01-22 NOTE — Progress Notes (Signed)
 Pt place on standing orders 2L oxygen through the night for oxygen saturation <90%. Pulse ox sustained between 92-94%

## 2024-01-22 NOTE — Evaluation (Signed)
 Occupational Therapy Evaluation Patient Details Name: Sheila Hays MRN: [id number] DOB: [birth year] Today's Date: 01/22/2024   History of Present Illness   Sheila Hays is a 53 y.o. female with medical history significant of chronic HFpEF, HTN, seizure disorder, COPD, CKD stage IIIb, morbid obesity, untreated OSA, severe pulmonary hypertension, presented with multiple complaints including chest pain, shortness of breath, and breakthrough seizure.    Clinical Impressions Sheila Hays was seen for OT evaluation this date. Prior to hospital admission, pt was IND for limited household distances, handheld assist for community distances. Pt lives with boyfriend in ground floor apartment. Pt currently requires CGA for ADL t/f ~20 ft x ~50 ft. SpO2 89-93% on RA with activity. MOD I for bed mobility and LB dressing. Pt appears near functional baseline, complain of lightheadedness and dizziness with all standing. Pt reports 5-6 falls related to seizures in the last 6 months. Educated on falls prevention and importance of mobility for functional strengthening, no skilled acute OT needs identified, will sign off. Upon hospital discharge, recommend no OT follow up.    If plan is discharge home, recommend the following:   A little help with walking and/or transfers     Functional Status Assessment   Patient has not had a recent decline in their functional status     Equipment Recommendations   None recommended by OT     Recommendations for Other Services         Precautions/Restrictions   Precautions Precautions:  (seizure) Restrictions Weight Bearing Restrictions Per Provider Order: No     Mobility Bed Mobility Overal bed mobility: Modified Independent                  Transfers Overall transfer level: Needs assistance Equipment used: None Transfers: Sit to/from Stand Sit to Stand: Contact guard assist                  Balance Overall balance  assessment: Needs assistance Sitting-balance support: No upper extremity supported, Feet supported Sitting balance-Leahy Scale: Normal     Standing balance support: Single extremity supported, During functional activity Standing balance-Leahy Scale: Good                             ADL either performed or assessed with clinical judgement   ADL Overall ADL's : Needs assistance/impaired                                       General ADL Comments: CGA for ADL t/f ~20 ft x ~50 ft.       Pertinent Vitals/Pain Pain Assessment Pain Assessment: No/denies pain     Extremity/Trunk Assessment Upper Extremity Assessment Upper Extremity Assessment: Overall WFL for tasks assessed   Lower Extremity Assessment Lower Extremity Assessment: Overall WFL for tasks assessed       Communication Communication Communication: No apparent difficulties   Cognition Arousal: Alert Behavior During Therapy: WFL for tasks assessed/performed Cognition: No apparent impairments                               Following commands: Intact       Cueing  General Comments      SpO2 89-93% on RA   Exercises     Shoulder Instructions      Home  Living Family/patient expects to be discharged to:: Private residence Living Arrangements: Spouse/significant other Available Help at Discharge: Family Type of Home: Apartment Home Access: Level entry     Home Layout: One level     Bathroom Shower/Tub: Runner, broadcasting/film/video: None   Additional Comments: reports 5-6 falls in 6 months related to seizures      Prior Functioning/Environment Prior Level of Function : Independent/Modified Independent             Mobility Comments: household ambulator no AD, holds boyfriends arm for limited community distances      OT Problem List: Impaired balance (sitting and/or standing)   OT Treatment/Interventions:        OT Goals(Current  goals can be found in the care plan section)   Acute Rehab OT Goals Patient Stated Goal: to go home OT Goal Formulation: With patient Time For Goal Achievement: 01/22/24 Potential to Achieve Goals: Good   OT Frequency:       Co-evaluation PT/OT/SLP Co-Evaluation/Treatment: Yes Reason for Co-Treatment: To address functional/ADL transfers;For patient/therapist safety PT goals addressed during session: Mobility/safety with mobility OT goals addressed during session: ADL's and self-care      AM-PAC OT 6 Clicks Daily Activity     Outcome Measure Help from another person eating meals?: None Help from another person taking care of personal grooming?: A Little Help from another person toileting, which includes using toliet, bedpan, or urinal?: None Help from another person bathing (including washing, rinsing, drying)?: A Little Help from another person to put on and taking off regular upper body clothing?: None Help from another person to put on and taking off regular lower body clothing?: None 6 Click Score: 22   End of Session Nurse Communication: Mobility status  Activity Tolerance: Patient tolerated treatment well Patient left: in bed;with call bell/phone within reach;with bed alarm set  OT Visit Diagnosis: Other abnormalities of gait and mobility (R26.89);Muscle weakness (generalized) (M62.81)                Time: 8984-8962 OT Time Calculation (min): 22 min Charges:  OT General Charges $OT Visit: 1 Visit OT Evaluation $OT Eval Low Complexity: 1 Low OT Treatments $Self Care/Home Management : 8-22 mins  Elston Slot, M.S. OTR/L  01/22/24, 12:17 PM  ascom [phone]

## 2024-01-22 NOTE — Consult Note (Signed)
 NEUROLOGY CONSULT NOTE   Date of service: January 22, 2024 Patient Name: Sheila Hays MRN:  [id number] DOB:  01-25-1971 Chief Complaint: Episodes of staring with unresponsiveness Requesting Provider: Maree Hue, MD  History of Present Illness  Sheila Hays is a 53 y.o. female with a PMHx of CHF, anemia, CKD 3, COPD, HLD, HTN and seizures who presented to the hospital yesterday evening with CP, SOB and two breakthrough seizures.   Her history of symptoms as documented in the Hospitalist H and P has been reviewed: Patient reported left she started to feel short of breath and productive cough of clear phlegm 2 days ago, she attributed to  broken sewage and kitchen sink with all the waste backed up and stinks and she also has had orthopnea and could not sleep on the flat bed for last 2 nights.  Denied any peripheral edema.  Last night, while watching TV family member found patient suddenly staring right forward and not responsive for about 3 to 5 minutes and then recovered back to baseline.  Patient does not remember what has happened.  Denied any body arm or legs shaking or twitching, no loss control of urine or bowel movement and no tongue or lip biting.  Overnight patient could not sleep because of shortness of breath and developed tightness in the chest worsening with deep breaths, this morning she had another episode of staring and unresponsive lasted about 5 minutes and this time family called EMS.  She reported that she is compliant with all her BP/CHF and seizure medications.  No fever or chills.  She also complained about global headache but no vision changes no feeling nauseous vomiting.  EMS arrived and found patient blood pressure 214/100, 1 spray of nitroglycerin  given and blood pressure repeated showed 190/90.  Patient reports chest pain as tightness like, 6/10 associated with worsening shortness of breath, much improved after blood pressure came down.  The patient states that  she has been fully compliant with her oxcarbazepine  dosing regimen at home. No EtOH or benzodiazepine use.   ROS  As per HPI. Does not endorse any additional symptoms at this time.   Past History   Past Medical History:  Diagnosis Date   Anemia    CHF (congestive heart failure) (HCC)    Chronic kidney disease (CKD), stage III (moderate) (HCC)    COPD (chronic obstructive pulmonary disease) (HCC)    GERD (gastroesophageal reflux disease)    Hyperlipidemia    Hypertension    Seizures (HCC)    Followed at [facility]   Sleep apnea     Past Surgical History:  Procedure Laterality Date   RIGHT HEART CATH N/A 11/25/2021   Procedure: RIGHT HEART CATH;  Surgeon: Darron Deatrice LABOR, MD;  Location: [facility] INVASIVE CV LAB;  Service: Cardiovascular;  Laterality: N/A;    Family History: Family History  Problem Relation Age of Onset   Hypertension Mother    Hypertension Father    Heart failure Sister    Diabetes Sister    Seizures Sister    Breast cancer Neg Hx     Social History  reports that she quit smoking about 7 years ago. Her smoking use included cigarettes. She started smoking about 14 years ago. She has a 3.5 pack-year smoking history. She quit smokeless tobacco use about 16 years ago. She reports that she does not currently use alcohol. She reports that she does not use drugs.  Allergies  Allergen Reactions   Ciprofloxacin Itching  Medications   Current Facility-Administered Medications:    0.9 %  sodium chloride  infusion, 250 mL, Intravenous, PRN, Zhang, Ping T, MD   acetaminophen  (TYLENOL ) tablet 1,000 mg, 1,000 mg, Oral, Once, Jesus America, NP   acetaminophen  (TYLENOL ) tablet 650 mg, 650 mg, Oral, Q4H PRN, Zhang, Ping T, MD, 650 mg at 01/21/24 2219   albuterol  (PROVENTIL ) (2.5 MG/3ML) 0.083% nebulizer solution 2.5 mg, 2.5 mg, Inhalation, Q4H PRN, Zhang, Ping T, MD   aspirin  EC tablet 81 mg, 81 mg, Oral, Daily, Laurita Manor T, MD, 81 mg at 01/21/24 8191   carvedilol   (COREG ) tablet 25 mg, 25 mg, Oral, BID WC, Zhang, Ping T, MD   enoxaparin  (LOVENOX ) injection 55 mg, 55 mg, Subcutaneous, Q24H, Hunt, Madison H, RPH   feeding supplement (ENSURE PLUS HIGH PROTEIN) liquid 237 mL, 237 mL, Oral, BID BM, Zhang, Ping T, MD   furosemide  (LASIX ) injection 40 mg, 40 mg, Intravenous, BID, Zhang, Ping T, MD, 40 mg at 01/21/24 8191   hydrALAZINE  (APRESOLINE ) injection 5 mg, 5 mg, Intravenous, Q6H PRN, Laurita Manor T, MD, 5 mg at 01/21/24 1808   hydrALAZINE  (APRESOLINE ) tablet 100 mg, 100 mg, Oral, TID, Zhang, Ping T, MD, 100 mg at 01/21/24 2223   levETIRAcetam  (KEPPRA ) tablet 500 mg, 500 mg, Oral, BID, Viviann Pastor, MD, 500 mg at 01/21/24 2220   montelukast  (SINGULAIR ) tablet 10 mg, 10 mg, Oral, Daily, Laurita Manor T, MD, 10 mg at 01/21/24 2220   ondansetron  (ZOFRAN ) injection 4 mg, 4 mg, Intravenous, Q6H PRN, Laurita Manor DASEN, MD   Oral care mouth rinse, 15 mL, Mouth Rinse, Q2H, Laurita Manor T, MD, 15 mL at 01/22/24 0144   Oral care mouth rinse, 15 mL, Mouth Rinse, PRN, Laurita Manor T, MD   Oxcarbazepine  (TRILEPTAL ) tablet 600 mg, 600 mg, Oral, BID, Zhang, Ping T, MD, 600 mg at 01/21/24 2221   rosuvastatin  (CRESTOR ) tablet 20 mg, 20 mg, Oral, QHS, Zhang, Ping T, MD, 20 mg at 01/21/24 2220   sacubitril -valsartan  (ENTRESTO ) 49-51 mg per tablet, 1 tablet, Oral, BID, Laurita Manor T, MD, 1 tablet at 01/21/24 2221   sodium chloride  flush (NS) 0.9 % injection 3 mL, 3 mL, Intravenous, Q12H, Laurita Manor T, MD, 3 mL at 01/22/24 0144   sodium chloride  flush (NS) 0.9 % injection 3 mL, 3 mL, Intravenous, PRN, Laurita Manor T, MD   spironolactone  (ALDACTONE ) tablet 25 mg, 25 mg, Oral, Daily, Zhang, Manor T, MD   traZODone  (DESYREL ) tablet 100 mg, 100 mg, Oral, QHS PRN, Laurita Manor DASEN, MD  No current facility-administered medications on file prior to encounter.   Current Outpatient Medications on File Prior to Encounter  Medication Sig Dispense Refill   acetaminophen  (TYLENOL ) 500 MG tablet  Take 500 mg by mouth every 8 (eight) hours as needed for mild pain.     ADVAIR DISKUS 250-50 MCG/ACT AEPB Inhale 1 puff into the lungs in the morning and at bedtime.     albuterol  (VENTOLIN  HFA) 108 (90 Base) MCG/ACT inhaler Inhale 2 puffs into the lungs every 6 (six) hours as needed.     aspirin  EC 81 MG tablet Take 81 mg by mouth daily.     budesonide -formoterol  (SYMBICORT ) 80-4.5 MCG/ACT inhaler Take 2 puffs first thing in am and then another 2 puffs about 12 hours later. 1 each 12   hydrALAZINE  (APRESOLINE ) 25 MG tablet Take 1 tablet (25 mg total) by mouth 3 (three) times daily. (Patient taking differently: Take 100 mg by mouth 3 (three)  times daily.) 90 tablet 1   hydrOXYzine (ATARAX) 25 MG tablet Take 25 mg by mouth daily as needed.     ipratropium-albuterol  (DUONEB) 0.5-2.5 (3) MG/3ML SOLN Take 3 mLs by nebulization every 6 (six) hours as needed.     montelukast  (SINGULAIR ) 10 MG tablet Take 10 mg by mouth daily.     oxcarbazepine  (TRILEPTAL ) 600 MG tablet Take 600 mg by mouth 2 (two) times daily.     pantoprazole  (PROTONIX ) 40 MG tablet Take 40 mg by mouth daily.     rosuvastatin  (CRESTOR ) 20 MG tablet Take 20 mg by mouth at bedtime.     sacubitril -valsartan  (ENTRESTO ) 49-51 MG Take 1 tablet by mouth 2 (two) times daily. 60 tablet 5   spironolactone  (ALDACTONE ) 25 MG tablet Take 1 tablet (25 mg total) by mouth daily. 90 tablet 3   traZODone  (DESYREL ) 100 MG tablet Take 100 mg by mouth at bedtime as needed for sleep.     vitamin C (ASCORBIC ACID ) 500 MG tablet Take 500 mg by mouth daily.      Vitals   Vitals:   Feb 13, 2024 2159 01/22/24 0136 01/22/24 0433 01/22/24 0434  BP: (!) 186/75 (!) 161/82  (!) 169/71  Pulse: 61 68  70  Resp:    18  Temp: 98.4 F (36.9 C) 98.1 F (36.7 C)  98.7 F (37.1 C)  TempSrc: Oral Oral    SpO2: 92% 100%  100%  Weight:   114.2 kg   Height:        Body mass index is 40.64 kg/m.   Physical Exam   Constitutional: Morbidly obese. NAD.  Psych:  Affect appropriate to situation.  Eyes: No scleral injection.  HENT: No OP obstruction.  Head: Normocephalic.  Respiratory: Effort normal, non-labored breathing.    Neurologic Examination  Mental Status: Awake and alert. Fully oriented x 5. Thought content appropriate. Speech fluent without evidence of aphasia.  Able to follow all commands without difficulty. Cranial Nerves: II: Temporal visual fields intact with no extinction to DSS. PERRL. III,IV, VI: No ptosis. EOMI. No nystagmus. Saccadic visual pursuits noted.  V: Temp sensation equal bilaterally VII: Smile symmetric VIII: Hearing intact to voice IX,X: No hypophonia or hoarseness XI: Symmetric XII: Midline tongue extension Motor: RUE: 5/5 LUE: 5/5 RLE: 5/5 LLE: 5/5 Sensory: Temp and FT intact x 4. No extinction to DSS. Deep Tendon Reflexes: 2+ and symmetric bilateral biceps, brachioradialis and patellae Cerebellar: No ataxia with FNF bilaterally Gait: Deferred   Labs/Imaging/Neurodiagnostic studies   CBC:  Recent Labs  Lab 02/13/2024 1343  WBC 4.5  HGB 10.7*  HCT 35.5*  MCV 73.8*  PLT 199   Basic Metabolic Panel:  Lab Results  Component Value Date   NA 142 01/22/2024   K 3.4 (L) 01/22/2024   CO2 28 01/22/2024   GLUCOSE 71 01/22/2024   BUN 24 (H) 01/22/2024   CREATININE 1.44 (H) 01/22/2024   CALCIUM  8.8 (L) 01/22/2024   GFRNONAA 43 (L) 01/22/2024   GFRAA 42 (L) 03/06/2018   Lipid Panel:  Lab Results  Component Value Date   LDLCALC 51 11/28/2021     ASSESSMENT  53 y.o. female with a PMHx of CHF, anemia, CKD 3, COPD, HLD, HTN and seizures who presented to the hospital yesterday evening with CP, SOB and two breakthrough seizures. She has been started on Keppra  500 mg BID this admission. Continued on her home dose of oxcarbazepine , which she has been fully compliant with.  - Neurological exam is nonfocal.  -  CT head: No acute intracranial abnormality, specifically, no acute hemorrhage, territorial  infarction, or intracranial mass. - Labs: - Na is normal - Ca slightly low at 8.8 - BUN 24, Cr 1.44, eGFR 43 - WBC normal - Carbamazepine metabolite level has been ordered - Impression: Breakthrough seizures on maximum recommended dose of carbamazepine. Will need EEG.   RECOMMENDATIONS  - Continue oxcarbazepine  at 600 mg BID - Continue newly started Keppra  at 500 mg BID - EEG  Addendum: - EEG: This study is within normal limits. No seizures or epileptiform discharges were seen throughout the recording.  - No changes to the above AED regimen.  - Outpatient Neurology follow up - Outpatient seizure precautions discussed with the patient, including no driving until seizure free for 6 months.  ______________________________________________________________________    Bonney SHARK, Peightyn Roberson, MD [facility]

## 2024-01-23 NOTE — Discharge Summary (Signed)
 Physician Discharge Summary   Patient: Sheila Hays MRN: [id number] DOB: 06/30/1970  Admit date:     01/21/2024  Discharge date: 01/22/2024  Discharge Physician: Cresencio Fairly   PCP: Sampson Ethridge LABOR, MD   Recommendations at discharge:    F/up with outpt providers as requested  Discharge Diagnoses: Principal Problem:   Seizure [facility]) Active Problems:   Acute on chronic diastolic CHF (congestive heart failure) (HCC)   Nonspecific chest pain  Hospital Course: Assessment and Plan:  53 y.o. female with medical history significant of chronic HFpEF, HTN, seizure disorder, COPD, CKD stage IIIb, morbid obesity, untreated OSA, severe pulmonary hypertension, presented with multiple complaints including chest pain, shortness of breath, and breakthrough seizure.   Breakthrough seizure Hx of absence seizure - Clinically suspect breakthrough seizure triggered by acute CHF decompensation/HTN emergency - Neuro seen  - Neurological exam is nonfocal.  - CT head: No acute intracranial abnormality, specifically, no acute hemorrhage, territorial infarction, or intracranial mass. - Continue oxcarbazepine  at 600 mg BID - Continue newly started Keppra  at 500 mg BID - EEG - within normal limits. No seizures or epileptiform discharges were seen throughout the recording.  - No changes to the above AED regimen.  - Outpatient Neurology follow up - Outpatient seizure precautions discussed with the patient, including no driving until seizure free for 6 months   Acute on chronic HFpEF decompensation - Diuresed with IV Lasix  and excellent response. Back to baseline at DC - Echocardiogram WNL - Resume home BP meds including Coreg , hydralazine , spironolactone  and Entresto    Anginal-like chest pain - Secondary to HTN emergency and CHF decompensation - Troponin trending is flat, implying demanding ischemia - Echocardiogram wnl   HTN emergency - POA and now resolved   CKD stage IIIa - Creatinine  level stable,   COPD - Stable, her breathing symptoms symptoms are likely caused by CHF decompensation - Continue ICS and LABA, continue as needed breathing breath   Morbid obesity Untreated OSA Severe pulmonary hypertension -BMI= 38 - Outpatient GLP-1 agonist & sleep studies evaluation   Chronic microcytic anemia - Denied any GI bleed history       Consultants: Neuro  Disposition: Home Diet recommendation:  Discharge Diet Orders (From admission, onward)     Start     Ordered   01/22/24 0000  Diet - low sodium heart healthy        01/22/24 1644           Carb modified diet DISCHARGE MEDICATION: Allergies as of 01/22/2024       Reactions   Ciprofloxacin Itching        Medication List     STOP taking these medications    budesonide -formoterol  80-4.5 MCG/ACT inhaler Commonly known as: Symbicort    spironolactone  25 MG tablet Commonly known as: ALDACTONE        TAKE these medications    acetaminophen  500 MG tablet Commonly known as: TYLENOL  Take 500 mg by mouth every 8 (eight) hours as needed for mild pain.   Advair Diskus 250-50 MCG/ACT Aepb Generic drug: fluticasone-salmeterol Inhale 1 puff into the lungs in the morning and at bedtime.   albuterol  108 (90 Base) MCG/ACT inhaler Commonly known as: VENTOLIN  HFA Inhale 2 puffs into the lungs every 6 (six) hours as needed.   ascorbic acid  500 MG tablet Commonly known as: VITAMIN C Take 500 mg by mouth daily.   aspirin  EC 81 MG tablet Take 81 mg by mouth daily.   carvedilol  25 MG tablet Commonly  known as: COREG  Take 1 tablet (25 mg total) by mouth 2 (two) times daily.   Entresto  49-51 MG Generic drug: sacubitril -valsartan  Take 1 tablet by mouth 2 (two) times daily.   hydrALAZINE  25 MG tablet Commonly known as: APRESOLINE  Take 1 tablet (25 mg total) by mouth 3 (three) times daily. What changed: how much to take   hydrOXYzine 25 MG tablet Commonly known as: ATARAX Take 25 mg by mouth  daily as needed.   ipratropium-albuterol  0.5-2.5 (3) MG/3ML Soln Commonly known as: DUONEB Take 3 mLs by nebulization every 6 (six) hours as needed.   levETIRAcetam  500 MG tablet Commonly known as: KEPPRA  Take 1 tablet (500 mg total) by mouth 2 (two) times daily.   montelukast  10 MG tablet Commonly known as: SINGULAIR  Take 10 mg by mouth daily.   oxcarbazepine  600 MG tablet Commonly known as: TRILEPTAL  Take 600 mg by mouth 2 (two) times daily.   pantoprazole  40 MG tablet Commonly known as: PROTONIX  Take 40 mg by mouth daily.   rosuvastatin  20 MG tablet Commonly known as: CRESTOR  Take 20 mg by mouth at bedtime.   torsemide  20 MG tablet Commonly known as: DEMADEX  Take 2 tablets (40 mg total) by mouth daily.   traZODone  100 MG tablet Commonly known as: DESYREL  Take 100 mg by mouth at bedtime as needed for sleep.        Follow-up Information     Entzminger, Ethridge LABOR, MD. Schedule an appointment as soon as possible for a visit in 1 week(s).   Specialty: Internal Medicine Why: [facility] Discharge F/UP Contact information: [address] [place] [address] [phone]         Lane Arthea BRAVO, MD. Schedule an appointment as soon as possible for a visit in 2 week(s).   Specialty: Neurology Why: [facility] Discharge F/UP Contact information: [address] [facility] [place] [address] [phone]                Discharge Exam: Filed Weights   01/21/24 1341 01/22/24 0433  Weight: 107.5 kg 114.2 kg   Eyes: PERRL, lids and conjunctivae normal ENMT: Mucous membranes are moist. Posterior pharynx clear of any exudate or lesions.Normal dentition.  Neck: normal, supple, no masses, no thyromegaly Respiratory: clear to auscultation bilaterally, no wheezing, No accessory muscle use.  Cardiovascular: Regular rate and rhythm, no murmurs / rubs / gallops. No pedal edema. 2+ pedal pulses. No carotid bruits.  Abdomen: no  tenderness, no masses palpated. No hepatosplenomegaly. Bowel sounds positive.  Musculoskeletal: no clubbing / cyanosis. No joint deformity upper and lower extremities. Good ROM, no contractures. Normal muscle tone.  Skin: no rashes, lesions, ulcers. No induration Neurologic: CN 2-12 grossly intact. Sensation intact, DTR normal. Strength 5/5 in all 4.  Psychiatric: Normal judgment and insight. Alert and oriented x 3. Normal mood.   Condition at discharge: fair  The results of significant diagnostics from this hospitalization (including imaging, microbiology, ancillary and laboratory) are listed below for reference.   Imaging Studies: EEG adult Result Date: 01/22/2024 Shelton Arlin KIDD, MD     01/22/2024  3:17 PM Patient Name: Sheila Hays MRN: [id number] Epilepsy Attending: Arlin KIDD Shelton Referring Physician/Provider: Laurita Cort DASEN, MD Date: 01/22/2024 Duration: 31.47 mins Patient history: 53yo F with breakthrough sz. EEG to evaluate for seizure Level of alertness: Awake, asleep AEDs during EEG study: LEV Technical aspects: This EEG study was done with scalp electrodes positioned according to the 10-20 International system of electrode placement.  Electrical activity was reviewed with band pass filter of 1-70Hz , sensitivity of 7 uV/mm, display speed of 43mm/sec with a 60Hz  notched filter applied as appropriate. EEG data were recorded continuously and digitally stored.  Video monitoring was available and reviewed as appropriate. Description: The posterior dominant rhythm consists of 8-9 Hz activity of moderate voltage (25-35 uV) seen predominantly in posterior head regions, symmetric and reactive to eye opening and eye closing. Sleep was characterized by vertex waves, sleep spindles (12 to 14 Hz), maximal frontocentral region. Hyperventilation and photic stimulation were not performed.   IMPRESSION: This study is within normal limits. No seizures or epileptiform discharges were seen throughout the  recording. A normal interictal EEG does not exclude the diagnosis of epilepsy. Arlin MALVA Krebs   ECHOCARDIOGRAM COMPLETE Result Date: 01/22/2024    ECHOCARDIOGRAM REPORT   Patient Name:   Sheila Hays [facility] Date of Exam: 01/22/2024 Medical Rec #:  [id number]          Height:       66.0 in Accession #:    [id number]         Weight:       251.8 lb Date of Birth:  [birth year]          BSA:          2.205 m Patient Age:    53 years           BP:           169/71 mmHg Patient Gender: F                  HR:           70 bpm. Exam Location:  [facility] Procedure: 2D Echo, Cardiac Doppler and Color Doppler (Both Spectral and Color            Flow Doppler were utilized during procedure). Indications:     Chest Pain R07.9  History:         Patient has prior history of Echocardiogram examinations, most                  recent 06/03/2022.  Sonographer:     Rosina Dunk Referring Phys:  [id number] CORT ONEIDA MANA Diagnosing Phys: Caron Poser IMPRESSIONS  1. Left ventricular ejection fraction, by estimation, is 55 to 60%. The left ventricle has normal function. Left ventricular endocardial border not optimally defined to evaluate regional wall motion. The left ventricular internal cavity size was mildly dilated. There is mild left ventricular hypertrophy. Left ventricular diastolic parameters are consistent with Grade II diastolic dysfunction (pseudonormalization). Elevated left atrial pressure.  2. Right ventricular systolic function is low normal. The right ventricular size is mildly enlarged. Tricuspid regurgitation signal is inadequate for assessing PA pressure.  3. A small pericardial effusion is present.  4. The inferior vena cava is dilated in size with <50% respiratory variability, suggesting right atrial pressure of 15 mmHg. Comparison(s): No significant change from prior study. FINDINGS  Left Ventricle: Left ventricular ejection fraction, by estimation, is 55 to 60%. The left ventricle has normal function. Left  ventricular endocardial border not optimally defined to evaluate regional wall motion. The left ventricular internal cavity size was mildly dilated. There is mild left ventricular hypertrophy. Left ventricular diastolic parameters are consistent with Grade II diastolic dysfunction (pseudonormalization). Elevated left atrial pressure. Right Ventricle: The right ventricular size is mildly enlarged. Right vetricular wall thickness was not well visualized. Right ventricular systolic function is low normal. Tricuspid regurgitation signal  is inadequate for assessing PA pressure. Left Atrium: Left atrial size was normal in size. Right Atrium: Right atrial size was mildly dilated. Pericardium: A small pericardial effusion is present. Mitral Valve: The mitral valve is abnormal. Mild mitral annular calcification. No evidence of mitral valve regurgitation. No evidence of mitral valve stenosis. MV peak gradient, 8.0 mmHg. The mean mitral valve gradient is 3.0 mmHg. Tricuspid Valve: The tricuspid valve is not well visualized. Tricuspid valve regurgitation is trivial. No evidence of tricuspid stenosis. Aortic Valve: The aortic valve is tricuspid. Aortic valve regurgitation is not visualized. No aortic stenosis is present. Aortic valve mean gradient measures 6.0 mmHg. Aortic valve peak gradient measures 11.4 mmHg. Aortic valve area, by VTI measures 2.47  cm. Pulmonic Valve: The pulmonic valve was not well visualized. Pulmonic valve regurgitation is mild. No evidence of pulmonic stenosis. Aorta: The aortic root and ascending aorta are structurally normal, with no evidence of dilitation. Venous: The inferior vena cava is dilated in size with less than 50% respiratory variability, suggesting right atrial pressure of 15 mmHg. IAS/Shunts: No atrial level shunt detected by color flow Doppler.  LEFT VENTRICLE PLAX 2D LVIDd:         5.30 cm     Diastology LVIDs:         4.00 cm     LV e' medial:    6.42 cm/s LV PW:         1.60 cm     LV  E/e' medial:  18.1 LV IVS:        1.40 cm     LV e' lateral:   6.96 cm/s LVOT diam:     2.20 cm     LV E/e' lateral: 16.7 LV SV:         84 LV SV Index:   38 LVOT Area:     3.80 cm  LV Volumes (MOD) LV vol d, MOD A2C: 99.9 ml LV vol d, MOD A4C: 99.5 ml LV vol s, MOD A2C: 33.9 ml LV vol s, MOD A4C: 31.5 ml LV SV MOD A2C:     66.0 ml LV SV MOD A4C:     99.5 ml LV SV MOD BP:      68.1 ml RIGHT VENTRICLE             IVC RV Basal diam:  4.40 cm     IVC diam: 2.30 cm RV Mid diam:    3.20 cm RV S prime:     15.20 cm/s TAPSE (M-mode): 2.0 cm LEFT ATRIUM             Index        RIGHT ATRIUM           Index LA diam:        4.60 cm 2.09 cm/m   RA Area:     20.50 cm LA Vol (A2C):   58.3 ml 26.44 ml/m  RA Volume:   54.70 ml  24.80 ml/m LA Vol (A4C):   41.8 ml 18.95 ml/m LA Biplane Vol: 51.0 ml 23.13 ml/m  AORTIC VALVE                     PULMONIC VALVE AV Area (Vmax):    2.24 cm      PV Vmax:        1.13 m/s AV Area (Vmean):   2.22 cm      PV Vmean:       76.800 cm/s AV Area (  VTI):     2.47 cm      PV VTI:         0.220 m AV Vmax:           169.00 cm/s   PV Peak grad:   5.1 mmHg AV Vmean:          112.000 cm/s  PV Mean grad:   3.0 mmHg AV VTI:            0.341 m       RVOT Peak grad: 2 mmHg AV Peak Grad:      11.4 mmHg AV Mean Grad:      6.0 mmHg LVOT Vmax:         99.70 cm/s LVOT Vmean:        65.300 cm/s LVOT VTI:          0.222 m LVOT/AV VTI ratio: 0.65  AORTA Ao Root diam: 3.20 cm Ao Asc diam:  3.00 cm MITRAL VALVE MV Area (PHT): 3.65 cm     SHUNTS MV Area VTI:   2.32 cm     Systemic VTI:  0.22 m MV Peak grad:  8.0 mmHg     Systemic Diam: 2.20 cm MV Mean grad:  3.0 mmHg     Pulmonic VTI:  0.134 m MV Vmax:       1.41 m/s MV Vmean:      84.4 cm/s MV Decel Time: 208 msec MV E velocity: 116.00 cm/s MV A velocity: 83.90 cm/s MV E/A ratio:  1.38 Caron Poser Electronically signed by Caron Poser Signature Date/Time: 01/22/2024/12:19:00 PM    Final    DG Chest 1 View Result Date: 01/22/2024 EXAM: 1 VIEW XRAY OF  THE CHEST 01/22/2024 07:06:00 AM COMPARISON: 01/21/2024 CLINICAL HISTORY: 53 y.o. female with a history of CHF, CKD, hypertension, GERD, and seizure disorder who presents with seizures and chest pain. FINDINGS: LUNGS AND PLEURA: Stable bilateral perihilar interstitial opacities which likely reflects vascular congestion and mild edema. No focal pulmonary opacity. No pulmonary edema. No pleural effusion. No pneumothorax. HEART AND MEDIASTINUM: Cardiomegaly. No acute abnormality of the cardiac and mediastinal silhouettes. BONES AND SOFT TISSUES: Asymmetric elevation of the right hemidiaphragm. No acute osseous abnormality. IMPRESSION: 1. Stable bilateral perihilar interstitial opacities, likely reflecting vascular congestion and mild edema. . 2. Cardiomegaly. 3. Asymmetric elevation of the right hemidiaphragm. Electronically signed by: Waddell Calk MD 01/22/2024 07:32 AM EDT RP Workstation: [id number]   CT Head Wo Contrast Result Date: 01/21/2024 CLINICAL DATA:  Seizure disorder, clinical change EXAM: CT HEAD WITHOUT CONTRAST TECHNIQUE: Contiguous axial images were obtained from the base of the skull through the vertex without intravenous contrast. RADIATION DOSE REDUCTION: This exam was performed according to the departmental dose-optimization program which includes automated exposure control, adjustment of the mA and/or kV according to patient size and/or use of iterative reconstruction technique. COMPARISON:  December 01, 2020, November 30, 2020 FINDINGS: Brain: The ventricles appear age appropriate. No mass effect or midline shift. Gray-white differentiation is preserved without focal attenuation abnormality.No evidence of acute territorial infarction, extra-axial fluid collection, hemorrhage, or mass lesion. Redemonstrated empty sella again noted. The basilar cisterns are patent without downward herniation. The cerebellar hemispheres and vermis are well formed without mass lesion or focal attenuation abnormality.  Vascular: No hyperdense vessel. Calcified atherosclerotic plaque within the cavernous/supraclinoid internal carotid arteries. Skull: Normal. Negative for fracture or focal lesion. Sinuses/Orbits: The paranasal sinuses and mastoids are clear.The globes appear intact. No retrobulbar hematoma. Other: None. IMPRESSION: No acute  intracranial abnormality, specifically, no acute hemorrhage, territorial infarction, or intracranial mass. Electronically Signed   By: Rogelia Myers M.D.   On: 01/21/2024 16:02   DG Chest 2 View Result Date: 01/21/2024 CLINICAL DATA:  Chest pain EXAM: CHEST - 2 VIEW COMPARISON:  07/10/2023 FINDINGS: Cardiomegaly, vascular congestion. Interstitial prominence likely reflects interstitial edema. No effusions or acute bony abnormality. IMPRESSION: Cardiomegaly with vascular congestion and probable early interstitial edema. Electronically Signed   By: Franky Crease M.D.   On: 01/21/2024 14:17    Microbiology: Results for orders placed or performed during the hospital encounter of 06/02/22  Resp panel by RT-PCR (RSV, Flu A&B, Covid) Anterior Nasal Swab     Status: Abnormal   Collection Time: 06/02/22  3:07 PM   Specimen: Anterior Nasal Swab  Result Value Ref Range Status   SARS Coronavirus 2 by RT PCR NEGATIVE NEGATIVE Final    Comment: (NOTE) SARS-CoV-2 target nucleic acids are NOT DETECTED.  The SARS-CoV-2 RNA is generally detectable in upper respiratory specimens during the acute phase of infection. The lowest concentration of SARS-CoV-2 viral copies this assay can detect is 138 copies/mL. A negative result does not preclude SARS-Cov-2 infection and should not be used as the sole basis for treatment or other patient management decisions. A negative result may occur with  improper specimen collection/handling, submission of specimen other than nasopharyngeal swab, presence of viral mutation(s) within the areas targeted by this assay, and inadequate number of  viral copies(<138 copies/mL). A negative result must be combined with clinical observations, patient history, and epidemiological information. The expected result is Negative.  Fact Sheet for Patients:  BloggerCourse.com  Fact Sheet for Healthcare Providers:  SeriousBroker.it  This test is no t yet approved or cleared by the [place]  FDA and  has been authorized for detection and/or diagnosis of SARS-CoV-2 by FDA under an Emergency Use Authorization (EUA). This EUA will remain  in effect (meaning this test can be used) for the duration of the COVID-19 declaration under Section 564(b)(1) of the Act, 21 U.S.C.section 360bbb-3(b)(1), unless the authorization is terminated  or revoked sooner.       Influenza A by PCR POSITIVE (A) NEGATIVE Final   Influenza B by PCR NEGATIVE NEGATIVE Final    Comment: (NOTE) The Xpert Xpress SARS-CoV-2/FLU/RSV plus assay is intended as an aid in the diagnosis of influenza from Nasopharyngeal swab specimens and should not be used as a sole basis for treatment. Nasal washings and aspirates are unacceptable for Xpert Xpress SARS-CoV-2/FLU/RSV testing.  Fact Sheet for Patients: BloggerCourse.com  Fact Sheet for Healthcare Providers: SeriousBroker.it  This test is not yet approved or cleared by the [place]  FDA and has been authorized for detection and/or diagnosis of SARS-CoV-2 by FDA under an Emergency Use Authorization (EUA). This EUA will remain in effect (meaning this test can be used) for the duration of the COVID-19 declaration under Section 564(b)(1) of the Act, 21 U.S.C. section 360bbb-3(b)(1), unless the authorization is terminated or revoked.     Resp Syncytial Virus by PCR NEGATIVE NEGATIVE Final    Comment: (NOTE) Fact Sheet for Patients: BloggerCourse.com  Fact Sheet for Healthcare  Providers: SeriousBroker.it  This test is not yet approved or cleared by the [place]  FDA and has been authorized for detection and/or diagnosis of SARS-CoV-2 by FDA under an Emergency Use Authorization (EUA). This EUA will remain in effect (meaning this test can be used) for the duration of the COVID-19 declaration under Section 564(b)(1) of the Act, 21  U.S.C. section 360bbb-3(b)(1), unless the authorization is terminated or revoked.  Performed at [facility], [address] [place], [place] [address]   Blood culture (routine x 2)     Status: None   Collection Time: 06/02/22  7:46 PM   Specimen: BLOOD RIGHT ARM  Result Value Ref Range Status   Specimen Description BLOOD RIGHT ARM  Final   Special Requests   Final    BOTTLES DRAWN AEROBIC AND ANAEROBIC Blood Culture adequate volume   Culture   Final    NO GROWTH 5 DAYS Performed at [facility], [address] [place], [place] [address]    Report Status 06/07/2022 FINAL  Final  Blood culture (routine x 2)     Status: None   Collection Time: 06/03/22  3:29 AM   Specimen: BLOOD  Result Value Ref Range Status   Specimen Description BLOOD RIGHT HAND  Final   Special Requests   Final    BOTTLES DRAWN AEROBIC AND ANAEROBIC Blood Culture results may not be optimal due to an inadequate volume of blood received in culture bottles   Culture   Final    NO GROWTH 5 DAYS Performed at [facility], [address], [place], [place] [address]    Report Status 06/08/2022 FINAL  Final    Labs: CBC: Recent Labs  Lab 01/21/24 1343  WBC 4.5  HGB 10.7*  HCT 35.5*  MCV 73.8*  PLT 199   Basic Metabolic Panel: Recent Labs  Lab 01/21/24 1343 01/22/24 0450  NA 143 142  K 3.9 3.4*  CL 106 104  CO2 26 28  GLUCOSE 93 71  BUN 24* 24*  CREATININE 1.46* 1.44*  CALCIUM  9.3 8.8*   Liver Function Tests: No results for input(s): AST, ALT, ALKPHOS, BILITOT, PROT,  ALBUMIN in the last 168 hours. CBG: No results for input(s): GLUCAP in the last 168 hours.  Discharge time spent: greater than 30 minutes.  Signed: Cresencio Fairly, MD [facility] 01/23/2024

## 2024-01-24 NOTE — Progress Notes (Unsigned)
 Advanced Heart Failure Clinic Note    PCP: Sampson Scales, MD (last seen 11/24) Primary Cardiologist: Franchester, Cadence, PA (last seen 01/24)  Chief Complaint: shortness of breath  HPI:  Ms Sheila Hays is a 53 y/o female with a history of HTN, hyperlipidemia, OSA, anemia, CKD, GERD, seizures and chronic heart failure. She had previously underwent nuclear stress test in 2016 that was abnormal, notable for perfusion defect of the lateral inferior segment with an EF of 35%. Echo at that time demonstrated an EF of 25-30%, mildly to moderately dilated LV cavity size, mild to moderate concentric LVH, mild to moderate MR, mild TR, PASP 55-60 mmHg, and a trivial pericardial effusion. Repeat echo in 2018 demonstrated a low normal LVSF with an EF of 50-55%, normal wall motion.  She was admitted 6/28-7/14/23 for shortness of breath and chest pain. BNP ws 500s. CXR with b/l pleural effusions and concern for sepsis. She was admitted for diuresis with IV lasix  and hypertensive urgency. RHC 11/25/21 showed severe pulmonary HTN. She had a fall in the bathroom with negative work-up. She was moved to the ICU for milrinone  drip and PICC line. Sildenafil  and BB were held for hypotension. Required norepinephrine . She was eventually transitioned to torsmide 40mg  daily.    Admitted 06/02/22 due to SOB/ cough due to bronchitis due to influenza.   Echo 11/30/20: EF 55-60% without LVH. Echo 11/21/21: EF 55-60% along with mild LVH and mild/moderate LAE Echo 06/03/22: EF 50-55% along with moderate LVH and mild LAE/RAE.     RHC done 11/25/21 and showed: Hemodynamic findings consistent with severe pulmonary hypertension.  Successful right heart catheterization via the right antecubital vein. This showed evidence of severely elevated right and left-sided filling pressures, severe pulmonary hypertension and normal cardiac output.  RA: 22 mmHg RV: 92/18 with an end-diastolic pressure of 32 mmHg. PA: 95/44 with a mean of 64 mmHg PCW:  30 mmHg  Cardiac output: 7.4 with an index of 3.16. Pulmonary vascular resistance: 4.5 Woods units  She presents today for a HF follow-up visit with a chief complaint of shortness of breath (improving). Has associated fatigue, occasional palpitations, occasional dizziness, pedal edema (improving). Denies chest pain, cough, abdominal distention or difficulty sleeping.   She says that her PCP increased her torsemide  to 60mg  daily X 3 days and then decreased it back down to 20mg  daily. She says that she feels like the swelling is returning since the reduction back to 20mg  daily.   ROS: All systems negative except as listed in HPI, PMH and Problem List.  SH:  Social History   Socioeconomic History   Marital status: Single    Spouse name: Not on file   Number of children: Not on file   Years of education: Not on file   Highest education level: Not on file  Occupational History   Not on file  Tobacco Use   Smoking status: Former    Current packs/day: 0.00    Average packs/day: 0.5 packs/day for 7.0 years (3.5 ttl pk-yrs)    Types: Cigarettes    Start date: 2011    Quit date: 2018    Years since quitting: 7.6   Smokeless tobacco: Former    Quit date: 03/07/2007  Vaping Use   Vaping status: Never Used  Substance and Sexual Activity   Alcohol use: Not Currently   Drug use: Never   Sexual activity: Not Currently  Other Topics Concern   Not on file  Social History Narrative   Not on file  Social Drivers of Corporate investment banker Strain: Not on file  Food Insecurity: No Food Insecurity (01/21/2024)   Hunger Vital Sign    Worried About Running Out of Food in the Last Year: Never true    Ran Out of Food in the Last Year: Never true  Transportation Needs: No Transportation Needs (01/21/2024)   PRAPARE - Administrator, Civil Service (Medical): No    Lack of Transportation (Non-Medical): No  Physical Activity: Not on file  Stress: Not on file  Social Connections:  Unknown (01/21/2024)   Social Connection and Isolation Panel    Frequency of Communication with Friends and Family: Patient declined    Frequency of Social Gatherings with Friends and Family: Patient declined    Attends Religious Services: Patient declined    Database administrator or Organizations: Patient declined    Attends Banker Meetings: Patient declined    Marital Status: Living with partner  Intimate Partner Violence: Not At Risk (01/21/2024)   Humiliation, Afraid, Rape, and Kick questionnaire    Fear of Current or Ex-Partner: No    Emotionally Abused: No    Physically Abused: No    Sexually Abused: No    FH:  Family History  Problem Relation Age of Onset   Hypertension Mother    Hypertension Father    Heart failure Sister    Diabetes Sister    Seizures Sister    Breast cancer Neg Hx     Past Medical History:  Diagnosis Date   Anemia    CHF (congestive heart failure) (HCC)    Chronic kidney disease (CKD), stage III (moderate) (HCC)    COPD (chronic obstructive pulmonary disease) (HCC)    GERD (gastroesophageal reflux disease)    Hyperlipidemia    Hypertension    Seizures (HCC)    Followed at [facility]   Sleep apnea     Current Outpatient Medications  Medication Sig Dispense Refill   acetaminophen  (TYLENOL ) 500 MG tablet Take 500 mg by mouth every 8 (eight) hours as needed for mild pain.     ADVAIR DISKUS 250-50 MCG/ACT AEPB Inhale 1 puff into the lungs in the morning and at bedtime.     albuterol  (VENTOLIN  HFA) 108 (90 Base) MCG/ACT inhaler Inhale 2 puffs into the lungs every 6 (six) hours as needed.     aspirin  EC 81 MG tablet Take 81 mg by mouth daily.     carvedilol  (COREG ) 25 MG tablet Take 1 tablet (25 mg total) by mouth 2 (two) times daily. 60 tablet 3   hydrALAZINE  (APRESOLINE ) 25 MG tablet Take 1 tablet (25 mg total) by mouth 3 (three) times daily. (Patient taking differently: Take 100 mg by mouth 3 (three) times daily.) 90 tablet 1   hydrOXYzine  (ATARAX) 25 MG tablet Take 25 mg by mouth daily as needed.     ipratropium-albuterol  (DUONEB) 0.5-2.5 (3) MG/3ML SOLN Take 3 mLs by nebulization every 6 (six) hours as needed.     levETIRAcetam  (KEPPRA ) 500 MG tablet Take 1 tablet (500 mg total) by mouth 2 (two) times daily. 60 tablet 0   montelukast  (SINGULAIR ) 10 MG tablet Take 10 mg by mouth daily.     oxcarbazepine  (TRILEPTAL ) 600 MG tablet Take 600 mg by mouth 2 (two) times daily.     pantoprazole  (PROTONIX ) 40 MG tablet Take 40 mg by mouth daily.     rosuvastatin  (CRESTOR ) 20 MG tablet Take 20 mg by mouth at bedtime.  sacubitril -valsartan  (ENTRESTO ) 49-51 MG Take 1 tablet by mouth 2 (two) times daily. 60 tablet 5   torsemide  (DEMADEX ) 20 MG tablet Take 2 tablets (40 mg total) by mouth daily. 30 tablet 0   traZODone  (DESYREL ) 100 MG tablet Take 100 mg by mouth at bedtime as needed for sleep.     vitamin C (ASCORBIC ACID ) 500 MG tablet Take 500 mg by mouth daily.     No current facility-administered medications for this visit.   There were no vitals filed for this visit.  Wt Readings from Last 3 Encounters:  01/22/24 251 lb 12.3 oz (114.2 kg)  10/06/23 253 lb (114.8 kg)  07/28/23 243 lb (110.2 kg)   Lab Results  Component Value Date   CREATININE 1.44 (H) 01/22/2024   CREATININE 1.46 (H) 01/21/2024   CREATININE 1.32 (H) 10/06/2023   PHYSICAL EXAM:  General: Well appearing. No resp difficulty HEENT: normal Neck: supple, no JVD Cor: Regular rhythm, rate. No rubs, gallops or murmurs Lungs: clear Abdomen: soft, nontender, nondistended. Extremities: no cyanosis, clubbing, rash, 1+ pitting edema bilateral lower legs Neuro: alert & oriented X 3. Moves all 4 extremities w/o difficulty. Affect pleasant   ECG: not done   ASSESSMENT & PLAN:  1: NICM with preserved ejection fraction- - suspect due to uncontrolled HTN - NYHA class II - euvolemic today - weighing daily; reminded to call for an overnight weight gain of > 2  pounds or a weekly weight gain of > 5 pounds - weight up 10 pounds from last visit here 2 months ago - Echo 11/30/20: EF 55-60% without LVH. - Echo 11/21/21: EF 55-60% along with mild LVH and mild/moderate LAE - Echo 06/03/22: EF 50-55% along with moderate LVH and mild LAE/RAE.  - not adding salt; reviewed the importance of reading food labels to keep her daily sodium intake to 2000mg  / day - trying to increase her activity and is walking daily at home and then going to the gym every other day - saw cardiology Dene) 01/24 - continue carvedilol  25mg  BID - continue hydralazine  100mg  TID - continue entresto  49/51 mg BID - continue torsemide  20mg  daily; most likely will need 40mg  daily but will get BMET results back today first - continue spironolactone  25mg  daily - BMET today - BNP 06/05/22 was 149.6  2: HTN- - BP 165/64 - saw PCP at [facility] 11/24 - BMP 07/28/23 reviewed: sodium 142, potassium 4.6, creatinine 1.16 and GFR 56 - BMET today - saw nephrology (Korrapati) 11/24  3: OSA- - has had previous sleep study 08/24  4: Pulmonary HTN- - RHC done 11/25/21:  Hemodynamic findings consistent with severe pulmonary hypertension.  Successful right heart catheterization via the right antecubital vein. This showed evidence of severely elevated right and left-sided filling pressures, severe pulmonary hypertension and normal cardiac output.  RA: 22 mmHg RV: 92/18 with an end-diastolic pressure of 32 mmHg. PA: 95/44 with a mean of 64 mmHg PCW: 30 mmHg  Cardiac output: 7.4 with an index of 3.16. Pulmonary vascular resistance: 4.5 Woods units   Return in 1 month, sooner if needed.   Ellouise DELENA Class, FNP 01/24/24

## 2024-01-26 ENCOUNTER — Ambulatory Visit (HOSPITAL_BASED_OUTPATIENT_CLINIC_OR_DEPARTMENT_OTHER): Admitting: Family

## 2024-01-26 ENCOUNTER — Telehealth: Payer: Self-pay

## 2024-01-26 ENCOUNTER — Other Ambulatory Visit (HOSPITAL_COMMUNITY): Payer: Self-pay

## 2024-01-26 ENCOUNTER — Encounter: Payer: Self-pay | Admitting: Family

## 2024-01-26 ENCOUNTER — Other Ambulatory Visit
Admission: RE | Admit: 2024-01-26 | Discharge: 2024-01-26 | Disposition: A | Discharge status: Home / Self Care | Source: Ambulatory Visit | Attending: Family | Admitting: Family

## 2024-01-26 ENCOUNTER — Ambulatory Visit: Payer: Self-pay | Admitting: Family

## 2024-01-26 VITALS — BP 145/62 | HR 61 | Wt 236.0 lb

## 2024-01-26 DIAGNOSIS — I130 Hypertensive heart and chronic kidney disease with heart failure and stage 1 through stage 4 chronic kidney disease, or unspecified chronic kidney disease: Secondary | ICD-10-CM | POA: Insufficient documentation

## 2024-01-26 DIAGNOSIS — I272 Pulmonary hypertension, unspecified: Secondary | ICD-10-CM | POA: Insufficient documentation

## 2024-01-26 DIAGNOSIS — I5032 Chronic diastolic (congestive) heart failure: Secondary | ICD-10-CM | POA: Insufficient documentation

## 2024-01-26 DIAGNOSIS — E785 Hyperlipidemia, unspecified: Secondary | ICD-10-CM | POA: Insufficient documentation

## 2024-01-26 DIAGNOSIS — D631 Anemia in chronic kidney disease: Secondary | ICD-10-CM | POA: Insufficient documentation

## 2024-01-26 DIAGNOSIS — E66812 Obesity, class 2: Secondary | ICD-10-CM

## 2024-01-26 DIAGNOSIS — N183 Chronic kidney disease, stage 3 unspecified: Secondary | ICD-10-CM | POA: Insufficient documentation

## 2024-01-26 DIAGNOSIS — Z79899 Other long term (current) drug therapy: Secondary | ICD-10-CM | POA: Insufficient documentation

## 2024-01-26 DIAGNOSIS — E669 Obesity, unspecified: Secondary | ICD-10-CM | POA: Insufficient documentation

## 2024-01-26 DIAGNOSIS — Z87891 Personal history of nicotine dependence: Secondary | ICD-10-CM | POA: Insufficient documentation

## 2024-01-26 DIAGNOSIS — I428 Other cardiomyopathies: Secondary | ICD-10-CM | POA: Insufficient documentation

## 2024-01-26 DIAGNOSIS — G4733 Obstructive sleep apnea (adult) (pediatric): Secondary | ICD-10-CM | POA: Insufficient documentation

## 2024-01-26 DIAGNOSIS — K219 Gastro-esophageal reflux disease without esophagitis: Secondary | ICD-10-CM | POA: Insufficient documentation

## 2024-01-26 DIAGNOSIS — R569 Unspecified convulsions: Secondary | ICD-10-CM | POA: Insufficient documentation

## 2024-01-26 DIAGNOSIS — I10 Essential (primary) hypertension: Secondary | ICD-10-CM

## 2024-01-26 DIAGNOSIS — Z6838 Body mass index (BMI) 38.0-38.9, adult: Secondary | ICD-10-CM

## 2024-01-26 LAB — BASIC METABOLIC PANEL WITH GFR
Anion gap: 15 (ref 5–15)
BUN: 24 mg/dL — ABNORMAL HIGH (ref 6–20)
CO2: 24 mmol/L (ref 22–32)
Calcium: 9.1 mg/dL (ref 8.9–10.3)
Chloride: 98 mmol/L (ref 98–111)
Creatinine, Ser: 1.14 mg/dL — ABNORMAL HIGH (ref 0.44–1.00)
GFR, Estimated: 57 mL/min — ABNORMAL LOW (ref >60)
Glucose, Bld: 75 mg/dL (ref 70–99)
Potassium: 3.7 mmol/L (ref 3.5–5.1)
Sodium: 137 mmol/L (ref 135–145)

## 2024-01-26 NOTE — Progress Notes (Signed)
 ReDS Vest / Clip - 01/26/24 1415       ReDS Vest / Clip   Station Marker D    Ruler Value 47    ReDS Value Range Low volume    ReDS Actual Value 33

## 2024-01-27 ENCOUNTER — Telehealth: Payer: Self-pay

## 2024-01-27 NOTE — Telephone Encounter (Signed)
 Patient does not currently meet criteria for prior authorization approval. Patient has been referred for a sleep study. Can submit for prior auth once sleep study is completed.  Rachel DEL, CPhT Rx Patient Advocate Phone: [phone]

## 2024-01-27 NOTE — Telephone Encounter (Signed)
 Faxed ov note, demos, and order for in facility split night to sleep works.

## 2024-02-29 ENCOUNTER — Emergency Department
Admission: EM | Admit: 2024-02-29 | Discharge: 2024-02-29 | Disposition: A | Discharge status: Home / Self Care | Attending: Emergency Medicine | Admitting: Emergency Medicine

## 2024-02-29 ENCOUNTER — Other Ambulatory Visit: Payer: Self-pay

## 2024-02-29 DIAGNOSIS — N390 Urinary tract infection, site not specified: Secondary | ICD-10-CM | POA: Insufficient documentation

## 2024-02-29 DIAGNOSIS — I10 Essential (primary) hypertension: Secondary | ICD-10-CM | POA: Diagnosis not present

## 2024-02-29 DIAGNOSIS — R569 Unspecified convulsions: Secondary | ICD-10-CM | POA: Diagnosis present

## 2024-02-29 LAB — URINALYSIS, W/ REFLEX TO CULTURE (INFECTION SUSPECTED)
Bilirubin Urine: NEGATIVE
Glucose, UA: NEGATIVE mg/dL
Hgb urine dipstick: NEGATIVE
Ketones, ur: NEGATIVE mg/dL
Nitrite: NEGATIVE
Protein, ur: 30 mg/dL — AB
Specific Gravity, Urine: 1.015 (ref 1.005–1.030)
WBC, UA: >50 WBC/hpf (ref 0–5)
pH: 6 (ref 5.0–8.0)

## 2024-02-29 LAB — CBC WITH DIFFERENTIAL/PLATELET
Abs Immature Granulocytes: 0.01 K/uL (ref 0.00–0.07)
Basophils Absolute: 0 K/uL (ref 0.0–0.1)
Basophils Relative: 1 %
Eosinophils Absolute: 0 K/uL (ref 0.0–0.5)
Eosinophils Relative: 1 %
HCT: 38.7 % (ref 36.0–46.0)
Hemoglobin: 12.2 g/dL (ref 12.0–15.0)
Immature Granulocytes: 0 %
Lymphocytes Relative: 26 %
Lymphs Abs: 1 K/uL (ref 0.7–4.0)
MCH: 23.7 pg — ABNORMAL LOW (ref 26.0–34.0)
MCHC: 31.5 g/dL (ref 30.0–36.0)
MCV: 75.1 fL — ABNORMAL LOW (ref 80.0–100.0)
Monocytes Absolute: 0.5 K/uL (ref 0.1–1.0)
Monocytes Relative: 13 %
Neutro Abs: 2.3 K/uL (ref 1.7–7.7)
Neutrophils Relative %: 59 %
Platelets: 172 K/uL (ref 150–400)
RBC: 5.15 MIL/uL — ABNORMAL HIGH (ref 3.87–5.11)
RDW: 23.5 % — ABNORMAL HIGH (ref 11.5–15.5)
WBC: 4 K/uL (ref 4.0–10.5)
nRBC: 0 % (ref 0.0–0.2)

## 2024-02-29 LAB — COMPREHENSIVE METABOLIC PANEL WITH GFR
ALT: 26 U/L (ref 0–44)
AST: 35 U/L (ref 15–41)
Albumin: 3.9 g/dL (ref 3.5–5.0)
Alkaline Phosphatase: 135 U/L — ABNORMAL HIGH (ref 38–126)
Anion gap: 9 (ref 5–15)
BUN: 35 mg/dL — ABNORMAL HIGH (ref 6–20)
CO2: 25 mmol/L (ref 22–32)
Calcium: 8.6 mg/dL — ABNORMAL LOW (ref 8.9–10.3)
Chloride: 101 mmol/L (ref 98–111)
Creatinine, Ser: 1.49 mg/dL — ABNORMAL HIGH (ref 0.44–1.00)
GFR, Estimated: 41 mL/min — ABNORMAL LOW (ref >60)
Glucose, Bld: 90 mg/dL (ref 70–99)
Potassium: 4 mmol/L (ref 3.5–5.1)
Sodium: 135 mmol/L (ref 135–145)
Total Bilirubin: 0.7 mg/dL (ref 0.0–1.2)
Total Protein: 7.6 g/dL (ref 6.5–8.1)

## 2024-02-29 MED ORDER — CEPHALEXIN 500 MG PO CAPS
500.0000 mg | ORAL_CAPSULE | Freq: Once | ORAL | Status: AC
  Administered 2024-02-29: 500 mg via ORAL
  Filled 2024-02-29: qty 1

## 2024-02-29 MED ORDER — CEPHALEXIN 500 MG PO CAPS: 500.0000 mg | ORAL_CAPSULE | Freq: Two times a day (BID) | ORAL | 0 refills | Status: AC

## 2024-02-29 NOTE — ED Triage Notes (Addendum)
 Pt to ED via POV from home. Pt was here with a friend who was being triaged. After triage pt was walking out into lobby and started staring off to the right with fixed gaze. Pt then started drooling and started to fall and was assisted to floor by staff. Event last approximately 45-60secs.Pt initially post ictal and fighting against staff to get into wheelchair with garbled speech. Pt taken to room 9. Pt is able to state name and medical hx once roomed. Pt is on seizure medications and family reports took medicine this am. Last seizure approximately 75yrs ago.

## 2024-02-29 NOTE — Discharge Instructions (Signed)
 Please take the antibiotics as prescribed for UTI.  Please read to follow-up with your neurologist to see if you need any medication adjustments.

## 2024-02-29 NOTE — ED Provider Notes (Signed)
 [facility] Provider Note    Event Date/Time   First MD Initiated Contact with Patient 02/29/24 1323     (approximate)   History   Seizures   HPI  Sheila Hays is a 53 y.o. female with history of seizure on Keppra  and Trileptal , hypertension, presenting with seizure.  Patient was accompanying friend in the emergency department, was in the lobby, started staring off to the right and drooling, was about to fall bolus assisted by staff.  Event lasted for less than 60 seconds.  She was postictal and slightly combative after she woke up with some garbled speech.  When I went to examine her, patient states that her last breakthrough seizure was several months ago.  Has been compliant with her seizure medications, took them this morning.  Is followed by neurology.  Denies any preceding symptoms, states that she was in her usual state of health, denies any headache, no chest pain or shortness of breath, no urinary symptoms, no nausea vomiting or diarrhea, no focal weakness or numbness, no vision changes.  No recent trauma or falls.  States that her seizures can be a combination of the staring spell and then her falling over.  Independent history obtained from staff as above.   On independent chart review, she was admitted in late August for breakthrough seizure, history of absence seizure.  At that time her seizure was suspected to be triggered by acute CHF exacerbation or hypertensive emergency.   Physical Exam   Triage Vital Signs: ED Triage Vitals  Encounter Vitals Group     BP 02/29/24 1315 (!) 122/59     Girls Systolic BP Percentile --      Girls Diastolic BP Percentile --      Boys Systolic BP Percentile --      Boys Diastolic BP Percentile --      Pulse Rate 02/29/24 1315 69     Resp 02/29/24 1315 18     Temp 02/29/24 1315 98.7 F (37.1 C)     Temp Source 02/29/24 1315 Oral     SpO2 02/29/24 1315 98 %     Weight --      Height --      Head  Circumference --      Peak Flow --      Pain Score 02/29/24 1316 0     Pain Loc --      Pain Education --      Exclude from Growth Chart --     Most recent vital signs: Vitals:   02/29/24 1315 02/29/24 1400  BP: (!) 122/59 (!) 161/77  Pulse: 69 68  Resp: 18 19  Temp: 98.7 F (37.1 C)   SpO2: 98% 98%     General: Awake, no distress.  CV:  Good peripheral perfusion.  Resp:  Normal effort.  No tachypnea or respiratory distress Abd:  No distention.  Nontender Other:  Pupils are equal reactive, extraocular movements are intact, no focal weakness or numbness, cranial nerves are intact, no slurred speech   ED Results / Procedures / Treatments   Labs (all labs ordered are listed, but only abnormal results are displayed) Labs Reviewed  COMPREHENSIVE METABOLIC PANEL WITH GFR - Abnormal; Notable for the following components:      Result Value   BUN 35 (*)    Creatinine, Ser 1.49 (*)    Calcium  8.6 (*)    Alkaline Phosphatase 135 (*)    GFR, Estimated 41 (*)  All other components within normal limits  CBC WITH DIFFERENTIAL/PLATELET - Abnormal; Notable for the following components:   RBC 5.15 (*)    MCV 75.1 (*)    MCH 23.7 (*)    RDW 23.5 (*)    All other components within normal limits  URINALYSIS, W/ REFLEX TO CULTURE (INFECTION SUSPECTED) - Abnormal; Notable for the following components:   Color, Urine YELLOW (*)    APPearance CLEAR (*)    Protein, ur 30 (*)    Leukocytes,Ua LARGE (*)    Bacteria, UA MANY (*)    All other components within normal limits  URINE CULTURE     EKG  EKG shows, sinus rhythm, rate 69, normal QS, normal QTc, no obvious ischemic ST elevation, T wave flattening in aVL, not significantly compared to prior   PROCEDURES:  Critical Care performed: No  Procedures   MEDICATIONS ORDERED IN ED: Medications  cephALEXin  (KEFLEX ) capsule 500 mg (has no administration in time range)     IMPRESSION / MDM / ASSESSMENT AND PLAN / ED COURSE   I reviewed the triage vital signs and the nursing notes.                              Differential diagnosis includes, but is not limited to, breakthrough seizure, no focal deficits to suggest CVA, she denies any infectious symptoms, denies nausea vomiting or diarrhea to suggest electrolyte derangements or dehydration.  No chest pain or shortness of breath to suggest ACS or CHF.  She is at her mental baseline at this time.  Given that this seems to be atypical seizure, she is not having any headache, no preceding symptoms, no focal neurodeficits on exam, she is back to mental baseline, CT imaging was considered but no indication at this time.  will get labs, UA.  Observation in the emergency department.  Patient's presentation is most consistent with acute presentation with potential threat to life or bodily function.  Independent interpretation of labs below.  Patient was observed in the emergency department without recurrent seizure, is asymptomatic at this time.  For UTI, first dose of Keflex  given in the emergency department.  Rested prescription was sent to her pharmacy.  Considered but no indication for inpatient admission at this time, she safe for outpatient management.  Will discharge with strict return precautions.  Shared decision making done with patient and she is agreeable with this plan.    Clinical Course as of 02/29/24 1506  Mon Feb 29, 2024  1425 Independent review of labs, no leukocytosis, electrolytes not severely deranged, mild AKI, alk phos is elevated but she has no jaundice abdominal pain at this time, is nonspecific. [TT]  1504 Urinalysis, w/ Reflex to Culture (Infection Suspected) -Urine, Clean Catch(!) UA consistent with UTI.  Will start her on antibiotics. [TT]    Clinical Course User Index [TT] Waymond Lorelle Cummins, MD     FINAL CLINICAL IMPRESSION(S) / ED DIAGNOSES   Final diagnoses:  Seizure (HCC)  Urinary tract infection without hematuria, site unspecified      Rx / DC Orders   ED Discharge Orders          Ordered    cephALEXin  (KEFLEX ) 500 MG capsule  2 times daily        02/29/24 1505             Note:  This document was prepared using Dragon voice recognition software and may include  unintentional dictation errors.    Waymond Lorelle Cummins, MD 02/29/24 [phone]

## 2024-02-29 NOTE — ED Notes (Signed)
 Pt given water for PO challenge.

## 2024-02-29 NOTE — ED Notes (Signed)
 Pt was asked to spit out her gum in case she had another seizure, pt refused and stated she was fine.

## 2024-03-02 LAB — URINE CULTURE: Culture: >=100000 — AB

## 2024-03-17 ENCOUNTER — Telehealth: Payer: Self-pay | Admitting: Family

## 2024-03-17 NOTE — Progress Notes (Unsigned)
 [facility] Note    PCP: Sampson Scales, MD  Primary Cardiologist: Franchester Mail, PA   Chief Complaint: fatigue   HPI:  Sheila Hays is a 53 y/o female with a history of HTN, hyperlipidemia, OSA, anemia, CKD, GERD, seizures and chronic heart failure. She had previously underwent nuclear stress test in 2016 that was abnormal, notable for perfusion defect of the lateral inferior segment with an EF of 35%. Echo at that time demonstrated an EF of 25-30%, mildly to moderately dilated LV cavity size, mild to moderate concentric LVH, mild to moderate MR, mild TR, PASP 55-60 mmHg, and a trivial pericardial effusion. Repeat echo in 2018 demonstrated a low normal LVSF with an EF of 50-55%, normal wall motion.  She was admitted 6/28-7/14/23 for shortness of breath and chest pain. BNP ws 500s. CXR with b/l pleural effusions and concern for sepsis. She was admitted for diuresis with IV lasix  and hypertensive urgency. RHC 11/25/21 showed severe pulmonary HTN. She had a fall in the bathroom with negative work-up. She was moved to the ICU for milrinone  drip and PICC line. Sildenafil  and BB were held for hypotension. Required norepinephrine . She was eventually transitioned to torsmide 40mg  daily.    Echo 11/30/20: EF 55-60% without LVH. Echo 11/21/21: EF 55-60% along with mild LVH and mild/moderate LAE  RHC done 11/25/21 and showed: Hemodynamic findings consistent with severe pulmonary hypertension.  Successful right heart catheterization via the right antecubital vein. This showed evidence of severely elevated right and left-sided filling pressures, severe pulmonary hypertension and normal cardiac output.  RA: 22 mmHg RV: 92/18 with an end-diastolic pressure of 32 mmHg. PA: 95/44 with a mean of 64 mmHg PCW: 30 mmHg  Cardiac output: 7.4 with an index of 3.16. Pulmonary vascular resistance: 4.5 Woods units  Admitted 06/02/22 due to SOB/ cough due to bronchitis due to influenza. Echo 06/03/22:  EF 50-55% along with moderate LVH and mild LAE/RAE.    Admitted 01/21/24 with chest pain, shortness of breath, and breakthrough seizure. Neuro consulted. CT head: No acute intracranial abnormality, specifically, no acute hemorrhage, territorial infarction, or intracranial mass. EEG within normal limits. No seizures or epileptiform discharges were seen throughout the recording. IV diuresed. Echo 01/22/24: EF 55-60% with mild LVH, G2DD, low normal RV, small pericardial effusion. Troponin is flat implying demand ischemia. Needs outpatient sleep study  Was in the ED 02/29/24 with seizure. started staring off to the right and drooling, lasted about 60 seconds. Reports compliance with meds. Found to have UTI. No further seizures so she was released.   She presents today for a HF follow-up visit with a chief complaint of minimal fatigue. Wakes up with HA as she's not sleeping well. She denies SOB, chest pain, palpitations, pedal edema, dizziness. Has had some seizures with 1 seizure where she ended up in the ED. Unclear as to what is causing her seizures. Sees neurology 04/05/24.   ROS: All systems negative except as listed in HPI, PMH and Problem List.  SH:  Social History   Socioeconomic History   Marital status: Single    Spouse name: Not on file   Number of children: Not on file   Years of education: Not on file   Highest education level: Not on file  Occupational History   Not on file  Tobacco Use   Smoking status: Former    Current packs/day: 0.00    Average packs/day: 0.5 packs/day for 7.0 years (3.5 ttl pk-yrs)    Types: Cigarettes  Start date: 2011    Quit date: 2018    Years since quitting: 7.8   Smokeless tobacco: Former    Quit date: 03/07/2007  Vaping Use   Vaping status: Never Used  Substance and Sexual Activity   Alcohol use: Not Currently   Drug use: Never   Sexual activity: Not Currently  Other Topics Concern   Not on file  Social History Narrative   Not on file    Social Drivers of Health   Financial Resource Strain: Not on file  Food Insecurity: No Food Insecurity (01/21/2024)   Hunger Vital Sign    Worried About Running Out of Food in the Last Year: Never true    Ran Out of Food in the Last Year: Never true  Transportation Needs: No Transportation Needs (01/21/2024)   PRAPARE - Administrator, Civil Service (Medical): No    Lack of Transportation (Non-Medical): No  Physical Activity: Not on file  Stress: Not on file  Social Connections: Unknown (01/21/2024)   Social Connection and Isolation Panel    Frequency of Communication with Friends and Family: Patient declined    Frequency of Social Gatherings with Friends and Family: Patient declined    Attends Religious Services: Patient declined    Database administrator or Organizations: Patient declined    Attends Banker Meetings: Patient declined    Marital Status: Living with partner  Intimate Partner Violence: Not At Risk (01/21/2024)   Humiliation, Afraid, Rape, and Kick questionnaire    Fear of Current or Ex-Partner: No    Emotionally Abused: No    Physically Abused: No    Sexually Abused: No    FH:  Family History  Problem Relation Age of Onset   Hypertension Mother    Hypertension Father    Heart failure Sister    Diabetes Sister    Seizures Sister    Breast cancer Neg Hx     Past Medical History:  Diagnosis Date   Anemia    CHF (congestive heart failure) (HCC)    Chronic kidney disease (CKD), stage III (moderate) (HCC)    COPD (chronic obstructive pulmonary disease) (HCC)    GERD (gastroesophageal reflux disease)    Hyperlipidemia    Hypertension    Seizures (HCC)    Followed at [facility]   Sleep apnea     Current Outpatient Medications  Medication Sig Dispense Refill   acetaminophen  (TYLENOL ) 500 MG tablet Take 500 mg by mouth every 8 (eight) hours as needed for mild pain.     ADVAIR DISKUS 250-50 MCG/ACT AEPB Inhale 1 puff into the lungs in the  morning and at bedtime.     albuterol  (VENTOLIN  HFA) 108 (90 Base) MCG/ACT inhaler Inhale 2 puffs into the lungs every 6 (six) hours as needed.     aspirin  EC 81 MG tablet Take 81 mg by mouth daily.     carvedilol  (COREG ) 25 MG tablet Take 1 tablet (25 mg total) by mouth 2 (two) times daily. 60 tablet 3   hydrALAZINE  (APRESOLINE ) 25 MG tablet Take 1 tablet (25 mg total) by mouth 3 (three) times daily. (Patient taking differently: Take 100 mg by mouth 3 (three) times daily.) 90 tablet 1   hydrOXYzine (ATARAX) 25 MG tablet Take 25 mg by mouth daily as needed.     ipratropium-albuterol  (DUONEB) 0.5-2.5 (3) MG/3ML SOLN Take 3 mLs by nebulization every 6 (six) hours as needed.     levETIRAcetam  (KEPPRA ) 500 MG tablet  Take 1 tablet (500 mg total) by mouth 2 (two) times daily. 60 tablet 0   montelukast  (SINGULAIR ) 10 MG tablet Take 10 mg by mouth daily.     oxcarbazepine  (TRILEPTAL ) 600 MG tablet Take 600 mg by mouth 2 (two) times daily.     pantoprazole  (PROTONIX ) 40 MG tablet Take 40 mg by mouth daily.     rosuvastatin  (CRESTOR ) 20 MG tablet Take 20 mg by mouth at bedtime.     sacubitril -valsartan  (ENTRESTO ) 49-51 MG Take 1 tablet by mouth 2 (two) times daily. 60 tablet 5   torsemide  (DEMADEX ) 20 MG tablet Take 2 tablets (40 mg total) by mouth daily. 30 tablet 0   traZODone  (DESYREL ) 100 MG tablet Take 100 mg by mouth at bedtime as needed for sleep.     vitamin C (ASCORBIC ACID ) 500 MG tablet Take 500 mg by mouth daily.     No current facility-administered medications for this visit.   Vitals:   03/18/24 1423  BP: 138/63  Pulse: 70  SpO2: 100%  Weight: 236 lb 6.4 oz (107.2 kg)  Height: 5' 6 (1.676 m)   Wt Readings from Last 3 Encounters:  03/18/24 236 lb 6.4 oz (107.2 kg)  01/26/24 236 lb (107 kg)  01/22/24 251 lb 12.3 oz (114.2 kg)   Lab Results  Component Value Date   CREATININE 1.49 (H) 02/29/2024   CREATININE 1.14 (H) 01/26/2024   CREATININE 1.44 (H) 01/22/2024     PHYSICAL  EXAM:  General: Well appearing.  Cor: No JVD. Regular rhythm, rate.  Lungs: clear Abdomen: soft, nontender, nondistended. Extremities: no edema Neuro:. Affect pleasant   ECG: not done    ASSESSMENT & PLAN:  1: NICM with preserved ejection fraction- - suspect due to uncontrolled HTN - NYHA Hays II - euvolemic today - weight stable from last visit here 6 weeks ago - Echo 11/30/20: EF 55-60% without LVH. - Echo 11/21/21: EF 55-60% along with mild LVH and mild/moderate LAE - Echo 06/03/22: EF 50-55% along with moderate LVH and mild LAE/RAE.  - Echo 01/22/24: EF 55-60% with mild LVH, G2DD, low normal RV, small pericardial effusion  - not adding salt; reviewed the importance of reading food labels to keep her daily sodium intake to 2000mg  / day - saw cardiology Dene) 01/24 - continue carvedilol  25mg  BID - continue hydralazine  100mg  TID - continue entresto  49/51 mg BID - continue torsemide  40mg  daily - MRA has been stopped due to worsening renal function during previous admission - BNP 01/01/24 was 1485.3  2: HTN- - BP 138/63 - sees PCP at [facility]  - BMP 02/29/24 reviewed: sodium 135, potassium 4.0, creatinine 1.49 and GFR 41 - saw nephrology Kolleen) 05/25  3: OSA- - has had previous sleep study 08/24 but without success as she says that she didn't do it right.  - had previously faxed sleep lab order but patient says that she hasn't heard anything from them - RN attempted to reach the sleep lab but had to leave voice message. She will follow back up with the sleep lab. She still prefers to do sleep study in the lab - need to rule out that OSA is contributing to her pHTN  4: Pulmonary HTN- - RHC done 11/25/21:  Hemodynamic findings consistent with severe pulmonary hypertension.  Successful right heart catheterization via the right antecubital vein. This showed evidence of severely elevated right and left-sided filling pressures, severe  pulmonary hypertension and normal cardiac output.  RA: 22  mmHg RV: 92/18 with an end-diastolic pressure of 32 mmHg. PA: 95/44 with a mean of 64 mmHg PCW: 30 mmHg  Cardiac output: 7.4 with an index of 3.16. Pulmonary vascular resistance: 4.5 Woods units  5: Obesity- - BMI 38.16 kg/ m2 - needs sleep study completed before GLP1 would be covered.  - patient is agreeable if it's covered - denies personal/ family history of medullary thyroid cancer   Return in 4 months, sooner if needed.   I spent 35 minutes reviewing records, interviewing/ examing patient and managing plan/ orders.    Sheila DELENA Class, FNP 03/17/24

## 2024-03-17 NOTE — Telephone Encounter (Signed)
 Called to confirm/remind patient of their appointment at the [facility] on 03/18/24.   Appointment:   [x] Confirmed  [] Left mess   [] No answer/No voice mail  [] VM Full/unable to leave message  [] Phone not in service  Patient reminded to bring all medications and/or complete list.  Confirmed patient has transportation. Gave directions, instructed to utilize valet parking.

## 2024-03-18 ENCOUNTER — Ambulatory Visit: Attending: Family | Admitting: Family

## 2024-03-18 ENCOUNTER — Encounter: Payer: Self-pay | Admitting: Family

## 2024-03-18 VITALS — BP 138/63 | HR 70 | Ht 66.0 in | Wt 236.4 lb

## 2024-03-18 DIAGNOSIS — I509 Heart failure, unspecified: Secondary | ICD-10-CM | POA: Diagnosis present

## 2024-03-18 DIAGNOSIS — Z6838 Body mass index (BMI) 38.0-38.9, adult: Secondary | ICD-10-CM | POA: Diagnosis not present

## 2024-03-18 DIAGNOSIS — R569 Unspecified convulsions: Secondary | ICD-10-CM | POA: Diagnosis not present

## 2024-03-18 DIAGNOSIS — I10 Essential (primary) hypertension: Secondary | ICD-10-CM

## 2024-03-18 DIAGNOSIS — Z8249 Family history of ischemic heart disease and other diseases of the circulatory system: Secondary | ICD-10-CM | POA: Diagnosis not present

## 2024-03-18 DIAGNOSIS — Z87891 Personal history of nicotine dependence: Secondary | ICD-10-CM | POA: Insufficient documentation

## 2024-03-18 DIAGNOSIS — K219 Gastro-esophageal reflux disease without esophagitis: Secondary | ICD-10-CM | POA: Diagnosis not present

## 2024-03-18 DIAGNOSIS — Z79899 Other long term (current) drug therapy: Secondary | ICD-10-CM | POA: Insufficient documentation

## 2024-03-18 DIAGNOSIS — I428 Other cardiomyopathies: Secondary | ICD-10-CM | POA: Insufficient documentation

## 2024-03-18 DIAGNOSIS — E785 Hyperlipidemia, unspecified: Secondary | ICD-10-CM | POA: Diagnosis not present

## 2024-03-18 DIAGNOSIS — I272 Pulmonary hypertension, unspecified: Secondary | ICD-10-CM | POA: Diagnosis not present

## 2024-03-18 DIAGNOSIS — E66812 Obesity, class 2: Secondary | ICD-10-CM

## 2024-03-18 DIAGNOSIS — I130 Hypertensive heart and chronic kidney disease with heart failure and stage 1 through stage 4 chronic kidney disease, or unspecified chronic kidney disease: Secondary | ICD-10-CM | POA: Diagnosis not present

## 2024-03-18 DIAGNOSIS — I5032 Chronic diastolic (congestive) heart failure: Secondary | ICD-10-CM

## 2024-03-18 DIAGNOSIS — D631 Anemia in chronic kidney disease: Secondary | ICD-10-CM | POA: Insufficient documentation

## 2024-03-18 DIAGNOSIS — E669 Obesity, unspecified: Secondary | ICD-10-CM | POA: Insufficient documentation

## 2024-03-18 DIAGNOSIS — G4733 Obstructive sleep apnea (adult) (pediatric): Secondary | ICD-10-CM | POA: Diagnosis not present

## 2024-03-18 DIAGNOSIS — N183 Chronic kidney disease, stage 3 unspecified: Secondary | ICD-10-CM | POA: Diagnosis not present

## 2024-03-18 NOTE — Patient Instructions (Signed)
 It was good to see you today!

## 2024-06-17 ENCOUNTER — Encounter

## 2024-07-19 ENCOUNTER — Encounter

## 2024-07-20 ENCOUNTER — Ambulatory Visit: Admitting: Family
